# Patient Record
Sex: Female | Born: 1985
Health system: Southern US, Community
[De-identification: ages and names within clinical notes are randomized; demographics above are authoritative.]

## PROBLEM LIST (undated history)

## (undated) DIAGNOSIS — M545 Low back pain, unspecified: Secondary | ICD-10-CM

## (undated) DIAGNOSIS — E785 Hyperlipidemia, unspecified: Secondary | ICD-10-CM

## (undated) DIAGNOSIS — C801 Malignant (primary) neoplasm, unspecified: Secondary | ICD-10-CM

## (undated) DIAGNOSIS — D582 Other hemoglobinopathies: Secondary | ICD-10-CM

## (undated) DIAGNOSIS — Z1371 Encounter for nonprocreative screening for genetic disease carrier status: Secondary | ICD-10-CM

## (undated) DIAGNOSIS — I509 Heart failure, unspecified: Secondary | ICD-10-CM

## (undated) DIAGNOSIS — D0511 Intraductal carcinoma in situ of right breast: Secondary | ICD-10-CM

## (undated) DIAGNOSIS — G43909 Migraine, unspecified, not intractable, without status migrainosus: Secondary | ICD-10-CM

## (undated) DIAGNOSIS — R7301 Impaired fasting glucose: Secondary | ICD-10-CM

## (undated) DIAGNOSIS — I10 Essential (primary) hypertension: Secondary | ICD-10-CM

## (undated) DIAGNOSIS — L309 Dermatitis, unspecified: Secondary | ICD-10-CM

## (undated) HISTORY — DX: Low back pain, unspecified: M54.50

## (undated) HISTORY — DX: Malignant (primary) neoplasm, unspecified: C80.1

## (undated) HISTORY — DX: Impaired fasting glucose: R73.01

## (undated) HISTORY — DX: Low back pain: M54.5

## (undated) HISTORY — DX: Other hemoglobinopathies: D58.2

## (undated) HISTORY — DX: Migraine, unspecified, not intractable, without status migrainosus: G43.909

## (undated) HISTORY — DX: Morbid (severe) obesity due to excess calories: E66.01

## (undated) HISTORY — DX: Hyperlipidemia, unspecified: E78.5

## (undated) HISTORY — DX: Essential (primary) hypertension: I10

---
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
## 2014-10-15 ENCOUNTER — Other Ambulatory Visit: Payer: Self-pay | Admitting: Family Medicine

## 2014-10-15 MED ORDER — AMLODIPINE BESYLATE 5 MG PO TABS: 5.0000 mg | ORAL_TABLET | Freq: Every day | ORAL | Status: DC

## 2014-10-15 NOTE — Telephone Encounter (Signed)
I just approved this earlier today; please resolve with pharmacy; thanks

## 2014-10-15 NOTE — Telephone Encounter (Signed)
Was a duplicate request.

## 2015-02-15 DIAGNOSIS — E785 Hyperlipidemia, unspecified: Secondary | ICD-10-CM | POA: Insufficient documentation

## 2015-02-15 DIAGNOSIS — Z6841 Body Mass Index (BMI) 40.0 and over, adult: Secondary | ICD-10-CM | POA: Insufficient documentation

## 2015-02-15 DIAGNOSIS — G43909 Migraine, unspecified, not intractable, without status migrainosus: Secondary | ICD-10-CM | POA: Insufficient documentation

## 2015-02-15 DIAGNOSIS — I10 Essential (primary) hypertension: Secondary | ICD-10-CM | POA: Insufficient documentation

## 2015-02-15 DIAGNOSIS — M545 Low back pain: Secondary | ICD-10-CM | POA: Insufficient documentation

## 2015-02-15 DIAGNOSIS — R7301 Impaired fasting glucose: Secondary | ICD-10-CM | POA: Insufficient documentation

## 2015-02-21 ENCOUNTER — Encounter: Payer: Self-pay | Admitting: Family Medicine

## 2015-02-21 ENCOUNTER — Ambulatory Visit (INDEPENDENT_AMBULATORY_CARE_PROVIDER_SITE_OTHER): Payer: 59 | Admitting: Family Medicine

## 2015-02-21 VITALS — BP 140/95 | HR 71 | Temp 97.5°F | Ht 64.5 in | Wt 305.0 lb

## 2015-02-21 DIAGNOSIS — I10 Essential (primary) hypertension: Secondary | ICD-10-CM | POA: Diagnosis not present

## 2015-02-21 DIAGNOSIS — R319 Hematuria, unspecified: Secondary | ICD-10-CM | POA: Diagnosis not present

## 2015-02-21 DIAGNOSIS — Z5181 Encounter for therapeutic drug level monitoring: Secondary | ICD-10-CM

## 2015-02-21 DIAGNOSIS — E559 Vitamin D deficiency, unspecified: Secondary | ICD-10-CM | POA: Diagnosis not present

## 2015-02-21 DIAGNOSIS — Z3009 Encounter for other general counseling and advice on contraception: Secondary | ICD-10-CM | POA: Insufficient documentation

## 2015-02-21 DIAGNOSIS — N926 Irregular menstruation, unspecified: Secondary | ICD-10-CM

## 2015-02-21 DIAGNOSIS — R7301 Impaired fasting glucose: Secondary | ICD-10-CM | POA: Diagnosis not present

## 2015-02-21 DIAGNOSIS — E785 Hyperlipidemia, unspecified: Secondary | ICD-10-CM

## 2015-02-21 LAB — MICROSCOPIC EXAMINATION: Renal Epithel, UA: NONE SEEN /hpf

## 2015-02-21 LAB — UA/M W/RFLX CULTURE, ROUTINE
Bilirubin, UA: NEGATIVE
Glucose, UA: NEGATIVE
Ketones, UA: NEGATIVE
LEUKOCYTES UA: NEGATIVE
Nitrite, UA: NEGATIVE
PH UA: 7 (ref 5.0–7.5)
PROTEIN UA: NEGATIVE
Specific Gravity, UA: 1.015 (ref 1.005–1.030)
Urobilinogen, Ur: 0.2 mg/dL (ref 0.2–1.0)

## 2015-02-21 MED ORDER — METOPROLOL SUCCINATE ER 50 MG PO TB24: 50.0000 mg | ORAL_TABLET | Freq: Every day | ORAL | Status: DC

## 2015-02-21 MED ORDER — AMLODIPINE BESYLATE 5 MG PO TABS: 5.0000 mg | ORAL_TABLET | Freq: Every day | ORAL | Status: DC

## 2015-02-21 NOTE — Progress Notes (Signed)
BP 140/95 mmHg  Pulse 71  Temp(Src) 97.5 F (36.4 C)  Ht 5' 4.5" (1.638 m)  Wt 305 lb (138.347 kg)  BMI 51.56 kg/m2  SpO2 99%  LMP 12/20/2014 (Exact Date)   Subjective:    Patient ID: Mckenzie Lopez, female    DOB: Dec 11, 1985, 29 y.o.   MRN: [id number]  HPI: Mckenzie Lopez is a 29 y.o. female  Chief Complaint  Patient presents with  . Hypertension  . Hyperlipidemia  . IFG  Patient is here for f/u of several issues Prediabetes; hard to exercise with current work schedule; not many sugary drinks; does drink some fruit juice, little bottles; not a big bread eater High cholesterol; ate bacon earlier today; not typical for her; just once a week; likes eggs She did not have a regular period last month; no chance she is pregnant; periods have been a little off since off of OCPs; bled for 23 days, then got back on track; had several  No constipation; does not have normal BM every day; very mild weight gain; aunt went through menopause around age 65 she thinks; she denies hair loss; skin dry but not new She did not come fasting today, and asked to return for labs on Monday She has morbid obesity; her biggest problem with working on weight loss is her work schedule; 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 Interim medical history since our last visit reviewed. Allergies and medications reviewed and updated.  Review of Systems Per HPI unless specifically indicated above     Objective:    BP 140/95 mmHg  Pulse 71  Temp(Src) 97.5 F (36.4 C)  Ht 5' 4.5" (1.638 m)  Wt 305 lb (138.347 kg)  BMI 51.56 kg/m2  SpO2 99%  LMP 12/20/2014 (Exact Date)  Wt Readings from Last 3 Encounters:  02/21/15 305 lb  (138.347 kg)  09/04/14 301 lb (136.533 kg)    Physical Exam  Constitutional: She appears well-developed and well-nourished. No distress.  Morbidly obese  HENT:  Head: Normocephalic and atraumatic.  Eyes: EOM are normal. No scleral icterus.  Neck: No thyromegaly present.  Cardiovascular: Normal rate, regular rhythm and normal heart sounds.   No murmur heard. Pulmonary/Chest: Effort normal and breath sounds normal. No respiratory distress. She has no wheezes.  Abdominal: Soft. Bowel sounds are normal. She exhibits no distension.  Musculoskeletal: Normal range of motion. She exhibits no edema.  Neurological: She is alert. She exhibits normal muscle tone.  Skin: Skin is warm and dry. She is not diaphoretic. No pallor.  Psychiatric: She has a normal mood and affect. Her behavior is normal. Judgment and thought content normal.    No results found for this or any previous visit.    Assessment & Plan:   Problem List Items Addressed This Visit      Cardiovascular and Mediastinum   Hypertension    Increase beta-blocker from 37.5 mg daily to 50 mg daily; monitor BP and pulse and contact me in 2 weeks; work on weight loss, DASH guidelines      Relevant Medications   metoprolol succinate (TOPROL-XL) 50 MG 24 hr tablet   amLODipine (NORVASC) 5 MG tablet  Endocrine   IFG (impaired fasting glucose) - Primary    Check A1C and glucose fasting on Monday; avoid sweets, get exercise, lose weight      Relevant Orders   Hgb A1c w/o eAG     Other   Morbid obesity (HCC)    Encouraged weight loss; see AVS      Hyperlipidemia    Check fasting cholesterol; limit saturated fats and eggs, increase fiber      Relevant Medications   metoprolol succinate (TOPROL-XL) 50 MG 24 hr tablet   amLODipine (NORVASC) 5 MG tablet   Other Relevant Orders   Lipid Panel w/o Chol/HDL Ratio   Hematuria    Explained abnormal urine from earlier this year; will recheck today      Relevant Orders   UA/M  w/rflx Culture, Routine   CBC with Differential/Platelet   Irregular periods    Check CBC and TSH; if periods wane, become less frequent, check LH and FSH      Relevant Orders   CBC with Differential/Platelet   TSH   Vitamin D deficiency   Relevant Orders   Vit D  25 hydroxy (rtn osteoporosis monitoring)   Medication monitoring encounter   Relevant Orders   Comprehensive metabolic panel       Follow up plan: Return in about 3 months (around 05/24/2015) for with Dr. Sanda Klein, Monday for fasting labs.  Orders Placed This Encounter  Procedures  . UA/M w/rflx Culture, Routine  . CBC with Differential/Platelet  . Comprehensive metabolic panel  . Lipid Panel w/o Chol/HDL Ratio  . TSH  . Vit D  25 hydroxy (rtn osteoporosis monitoring)  . Hgb A1c w/o eAG   An after-visit summary was printed and given to the patient at [place].  Please see the patient instructions which may contain other information and recommendations beyond what is mentioned above in the assessment and plan.

## 2015-02-21 NOTE — Assessment & Plan Note (Signed)
Encouraged weight loss; see AVS 

## 2015-02-21 NOTE — Assessment & Plan Note (Signed)
Explained abnormal urine from earlier this year; will recheck today

## 2015-02-21 NOTE — Assessment & Plan Note (Signed)
Check A1C and glucose fasting on Monday; avoid sweets, get exercise, lose weight

## 2015-02-21 NOTE — Patient Instructions (Addendum)
Do limit saturated fats like bacon and sausage and cheese and hamburgers, etc. Try to get more fiber Return on Monday for fasting labs Try to increase your activity, work up gradually to 150 minutes per week Increase your water intake to 64 ounces a day Your goal blood pressure is less than 140 mmHg on top and less than 90 mmHg Try to follow the DASH guidelines (DASH stands for Dietary Approaches to Stop Hypertension) Try to limit the sodium in your diet.  Ideally, consume less than 1.5 grams (less than 1,500mg ) per day. Do not add salt when cooking or at the table.  Check the sodium amount on labels when shopping, and choose items lower in sodium when given a choice. Avoid or limit foods that already contain a lot of sodium. Eat a diet rich in fruits and vegetables and whole grains. Return in 3 months Do check your blood pressure and pulse a few times a week and contact me with readings in 2 weeks Check out the information at familydoctor.org entitled "What It Takes to Lose Weight" Try to lose between 1-2 pounds per week by taking in fewer calories and burning off more calories You can succeed by limiting portions, limiting foods dense in calories and fat, becoming more active, and drinking 8 glasses of water a day (64 ounces) Don't skip meals, especially breakfast, as skipping meals may alter your metabolism Do not use over-the-counter weight loss pills or gimmicks that claim rapid weight loss A healthy BMI (or body mass index) is between 18.5 and 24.9 You can calculate your ideal BMI at the [place] website ClubMon[employer]fr

## 2015-02-21 NOTE — Assessment & Plan Note (Signed)
Check CBC and TSH; if periods wane, become less frequent, check LH and [facility]

## 2015-02-21 NOTE — Assessment & Plan Note (Signed)
Check fasting cholesterol; limit saturated fats and eggs, increase fiber

## 2015-02-21 NOTE — Assessment & Plan Note (Signed)
Increase beta-blocker from 37.5 mg daily to 50 mg daily; monitor BP and pulse and contact me in 2 weeks; work on weight loss, DASH guidelines

## 2015-02-25 ENCOUNTER — Other Ambulatory Visit: Payer: 59

## 2015-02-25 DIAGNOSIS — R7301 Impaired fasting glucose: Secondary | ICD-10-CM

## 2015-02-25 DIAGNOSIS — N926 Irregular menstruation, unspecified: Secondary | ICD-10-CM

## 2015-02-25 DIAGNOSIS — R319 Hematuria, unspecified: Secondary | ICD-10-CM

## 2015-02-25 DIAGNOSIS — Z5181 Encounter for therapeutic drug level monitoring: Secondary | ICD-10-CM

## 2015-02-25 DIAGNOSIS — E785 Hyperlipidemia, unspecified: Secondary | ICD-10-CM

## 2015-02-25 DIAGNOSIS — E559 Vitamin D deficiency, unspecified: Secondary | ICD-10-CM

## 2015-02-26 LAB — CBC WITH DIFFERENTIAL/PLATELET
BASOS ABS: 0 10*3/uL (ref 0.0–0.2)
BASOS: 1 %
EOS (ABSOLUTE): 0.1 10*3/uL (ref 0.0–0.4)
EOS: 3 %
HEMATOCRIT: 36.5 % (ref 34.0–46.6)
HEMOGLOBIN: 12.8 g/dL (ref 11.1–15.9)
IMMATURE GRANS (ABS): 0 10*3/uL (ref 0.0–0.1)
IMMATURE GRANULOCYTES: 0 %
Lymphocytes Absolute: 1.6 10*3/uL (ref 0.7–3.1)
Lymphs: 32 %
MCH: 27.5 pg (ref 26.6–33.0)
MCHC: 35.1 g/dL (ref 31.5–35.7)
MCV: 79 fL (ref 79–97)
Monocytes Absolute: 0.3 10*3/uL (ref 0.1–0.9)
Monocytes: 6 %
Neutrophils Absolute: 3 10*3/uL (ref 1.4–7.0)
Neutrophils: 58 %
Platelets: 428 10*3/uL — ABNORMAL HIGH (ref 150–379)
RBC: 4.65 x10E6/uL (ref 3.77–5.28)
RDW: 15.9 % — ABNORMAL HIGH (ref 12.3–15.4)
WBC: 5.1 10*3/uL (ref 3.4–10.8)

## 2015-02-26 LAB — LIPID PANEL W/O CHOL/HDL RATIO
Cholesterol, Total: 192 mg/dL (ref 100–199)
HDL: 46 mg/dL (ref >39)
LDL CALC: 129 mg/dL — AB (ref 0–99)
TRIGLYCERIDES: 85 mg/dL (ref 0–149)
VLDL CHOLESTEROL CAL: 17 mg/dL (ref 5–40)

## 2015-02-26 LAB — TSH: TSH: 2.17 u[IU]/mL (ref 0.450–4.500)

## 2015-02-26 LAB — COMPREHENSIVE METABOLIC PANEL
ALBUMIN: 4.1 g/dL (ref 3.5–5.5)
ALT: 13 IU/L (ref 0–32)
AST: 14 IU/L (ref 0–40)
Albumin/Globulin Ratio: 1.4 (ref 1.1–2.5)
Alkaline Phosphatase: 52 IU/L (ref 39–117)
BUN / CREAT RATIO: 9 (ref 8–20)
BUN: 9 mg/dL (ref 6–20)
Bilirubin Total: 0.4 mg/dL (ref 0.0–1.2)
CALCIUM: 9.1 mg/dL (ref 8.7–10.2)
CO2: 23 mmol/L (ref 18–29)
CREATININE: 0.95 mg/dL (ref 0.57–1.00)
Chloride: 103 mmol/L (ref 97–106)
GFR calc Af Amer: 92 mL/min/{1.73_m2} (ref >59)
GFR, EST NON AFRICAN AMERICAN: 80 mL/min/{1.73_m2} (ref >59)
GLOBULIN, TOTAL: 2.9 g/dL (ref 1.5–4.5)
GLUCOSE: 96 mg/dL (ref 65–99)
Potassium: 3.9 mmol/L (ref 3.5–5.2)
SODIUM: 140 mmol/L (ref 136–144)
Total Protein: 7 g/dL (ref 6.0–8.5)

## 2015-02-26 LAB — VITAMIN D 25 HYDROXY (VIT D DEFICIENCY, FRACTURES): Vit D, 25-Hydroxy: 34.3 ng/mL (ref 30.0–100.0)

## 2015-02-26 LAB — HGB A1C W/O EAG: Hgb A1c MFr Bld: 5.5 % (ref 4.8–5.6)

## 2015-03-04 ENCOUNTER — Encounter: Payer: Self-pay | Admitting: Family Medicine

## 2015-04-10 ENCOUNTER — Telehealth: Payer: Self-pay

## 2015-04-10 NOTE — Telephone Encounter (Signed)
She just found out that she is pregnant, wants to know if her BP med is safe for her to take.

## 2015-04-11 MED ORDER — PRENATA 29-1 MG PO CHEW: 1.0000 | CHEWABLE_TABLET | Freq: Every day | ORAL | Status: DC

## 2015-04-11 NOTE — Telephone Encounter (Signed)
I talked with patient, both BP meds are category C; she has appt to see OB on Tuesday; she is [redacted] weeks along; I asked her first to call OB to see what she wants to do (change meds there, have me change meds, or just wait until Tuesday) Patient agrees and she'll call OB right now

## 2015-05-08 DIAGNOSIS — D582 Other hemoglobinopathies: Secondary | ICD-10-CM | POA: Insufficient documentation

## 2015-05-28 ENCOUNTER — Ambulatory Visit (INDEPENDENT_AMBULATORY_CARE_PROVIDER_SITE_OTHER): Payer: 59 | Admitting: Family Medicine

## 2015-05-28 ENCOUNTER — Encounter: Payer: Self-pay | Admitting: Family Medicine

## 2015-05-28 VITALS — BP 153/91 | HR 76 | Temp 97.3°F | Ht 64.0 in | Wt 294.0 lb

## 2015-05-28 DIAGNOSIS — E785 Hyperlipidemia, unspecified: Secondary | ICD-10-CM | POA: Diagnosis not present

## 2015-05-28 DIAGNOSIS — R7301 Impaired fasting glucose: Secondary | ICD-10-CM | POA: Diagnosis not present

## 2015-05-28 DIAGNOSIS — I10 Essential (primary) hypertension: Secondary | ICD-10-CM | POA: Diagnosis not present

## 2015-05-28 NOTE — Assessment & Plan Note (Signed)
Encouragement given for healthier eating

## 2015-05-28 NOTE — Assessment & Plan Note (Addendum)
Encouraged DASH guidelines; see AVS; I personally called OB with her BP readings, left detailed message, asked if they will contact patient directly about adjusting her BP medicine; praised patient for weight management attempts, eating better; we personally reviewed the [place] page and I showed sodium content for various foods

## 2015-05-28 NOTE — Progress Notes (Signed)
BP 153/91 mmHg  Pulse 76  Temp(Src) 97.3 F (36.3 C)  Ht 5\' 4"  (1.626 m)  Wt 294 lb (133.358 kg)  BMI 50.44 kg/m2  SpO2 97%  LMP 12/20/2014 (Exact Date)   Subjective:    Patient ID: Mckenzie Lopez, female    DOB: February 22, 1986, 30 y.o.   MRN: [id number]  HPI: Mckenzie Lopez is a 30 y.o. female  Chief Complaint  Patient presents with  . Hypertension    Patient had to stop the Toprol due to be pregnant  . Hyperlipidemia  . IFG  . Morbid Obesity  . Eye Problem    blood shot right eye since Friday.   She is 3 months pregnant; [facility] July 30th; her OB switched her beta-blocker; she is not on the toprol, on the labetalol now; 134/83 at the [facility] office; she goes back March 1st She had some french fries with salt on them in the last few days, [employer] fries,   High cholesterol; she eats processed pork products, bacon; does eat eggs, maybe 4 a week; likes cheese  She has a blood vessel that broke on her right eye; her allergies were really bothering her; was also throwing up and she thinks maybe she did it with big sneeze or dry heaving; no eye pain; no vision problems; no double vision; no nosebleeding, no gum bleeding, no blood in urine or stool  Prediabetes; they are watching her sugars at [facility]; never had gestational diabetes; mother is borderline diabetes  She has been losing weight, trying to eat better; lost 11 pounds since last visit  Exercising and getting sleep  Last pap smear April 06, 2015 or thereabouts; normal per patient  Relevant past medical, surgical, family and social history reviewed and updated as indicated. Interim medical history since our last visit reviewed. Allergies and medications reviewed and updated.  Review of Systems Per HPI unless specifically indicated above     Objective:    BP 153/91 mmHg  Pulse 76  Temp(Src) 97.3 F (36.3 C)  Ht 5\' 4"  (1.626 m)  Wt 294 lb (133.358 kg)  BMI 50.44 kg/m2  SpO2 97%  LMP 12/20/2014 (Exact  Date)  Wt Readings from Last 3 Encounters:  05/28/15 294 lb (133.358 kg)  02/21/15 305 lb (138.347 kg)  09/04/14 301 lb (136.533 kg)    Today's Vitals   05/28/15 0821 05/28/15 0850  BP: 153/97 153/91  Pulse: 87 76  Temp: 97.3 F (36.3 C)   Height: 5\' 4"  (1.626 m)   Weight: 294 lb (133.358 kg)   SpO2: 97%     Physical Exam  Constitutional: She appears well-developed and well-nourished. No distress.  HENT:  Head: Normocephalic and atraumatic.  Eyes: EOM are normal. No scleral icterus.  Conjunctival hemorrhage, small lateral right eye  Neck: No thyromegaly present.  Cardiovascular: Normal rate, regular rhythm and normal heart sounds.   No murmur heard. Pulmonary/Chest: Effort normal and breath sounds normal. No respiratory distress. She has no wheezes.  Abdominal: Soft. She exhibits no distension.  Musculoskeletal: Normal range of motion. She exhibits no edema.  Neurological: She is alert. She exhibits normal muscle tone.  Skin: Skin is warm and dry. She is not diaphoretic. No pallor.  Psychiatric: She has a normal mood and affect. Her behavior is normal. Judgment and thought content normal.   Results for orders placed or performed in visit on 02/25/15  CBC with Differential/Platelet  Result Value Ref Range   WBC 5.1 3.4 - 10.8 x10E3/uL  RBC 4.65 3.77 - 5.28 x10E6/uL   Hemoglobin 12.8 11.1 - 15.9 g/dL   Hematocrit 36.5 34.0 - 46.6 %   MCV 79 79 - 97 fL   MCH 27.5 26.6 - 33.0 pg   MCHC 35.1 31.5 - 35.7 g/dL   RDW 15.9 (H) 12.3 - 15.4 %   Platelets 428 (H) 150 - 379 x10E3/uL   Neutrophils 58 %   Lymphs 32 %   Monocytes 6 %   Eos 3 %   Basos 1 %   Neutrophils Absolute 3.0 1.4 - 7.0 x10E3/uL   Lymphocytes Absolute 1.6 0.7 - 3.1 x10E3/uL   Monocytes Absolute 0.3 0.1 - 0.9 x10E3/uL   EOS (ABSOLUTE) 0.1 0.0 - 0.4 x10E3/uL   Basophils Absolute 0.0 0.0 - 0.2 x10E3/uL   Immature Granulocytes 0 %   Immature Grans (Abs) 0.0 0.0 - 0.1 x10E3/uL  Comprehensive metabolic panel   Result Value Ref Range   Glucose 96 65 - 99 mg/dL   BUN 9 6 - 20 mg/dL   Creatinine, Ser 0.95 0.57 - 1.00 mg/dL   GFR calc non Af Amer 80 >59 mL/min/1.73   GFR calc Af Amer 92 >59 mL/min/1.73   BUN/Creatinine Ratio 9 8 - 20   Sodium 140 136 - 144 mmol/L   Potassium 3.9 3.5 - 5.2 mmol/L   Chloride 103 97 - 106 mmol/L   CO2 23 18 - 29 mmol/L   Calcium 9.1 8.7 - 10.2 mg/dL   Total Protein 7.0 6.0 - 8.5 g/dL   Albumin 4.1 3.5 - 5.5 g/dL   Globulin, Total 2.9 1.5 - 4.5 g/dL   Albumin/Globulin Ratio 1.4 1.1 - 2.5   Bilirubin Total 0.4 0.0 - 1.2 mg/dL   Alkaline Phosphatase 52 39 - 117 IU/L   AST 14 0 - 40 IU/L   ALT 13 0 - 32 IU/L  Lipid Panel w/o Chol/HDL Ratio  Result Value Ref Range   Cholesterol, Total 192 100 - 199 mg/dL   Triglycerides 85 0 - 149 mg/dL   HDL 46 >39 mg/dL   VLDL Cholesterol Cal 17 5 - 40 mg/dL   LDL Calculated 129 (H) 0 - 99 mg/dL  TSH  Result Value Ref Range   TSH 2.170 0.450 - 4.500 uIU/mL  Vit D  25 hydroxy (rtn osteoporosis monitoring)  Result Value Ref Range   Vit D, 25-Hydroxy 34.3 30.0 - 100.0 ng/mL  Hgb A1c w/o eAG  Result Value Ref Range   Hgb A1c MFr Bld 5.5 4.8 - 5.6 %      Assessment & Plan:   Problem List Items Addressed This Visit      Cardiovascular and Mediastinum   Hypertension - Primary    Encouraged DASH guidelines; see AVS; I personally called OB with her BP readings, left detailed message, asked if they will contact patient directly about adjusting her BP medicine; praised patient for weight management attempts, eating better; we personally reviewed the [employer] page and I showed sodium content for various foods      Relevant Medications   labetalol (NORMODYNE) 200 MG tablet     Endocrine   IFG (impaired fasting glucose)    Patient will be having her glucose monitored by her OB; encouragement given for weight loss        Other   Morbid obesity ([place])    Encouragement given for healthier eating      Hyperlipidemia     Really focus on dietary changes; see AVS; no medication at  this time since she is pregnant      Relevant Medications   labetalol (NORMODYNE) 200 MG tablet       Follow up plan: No Follow-up on file.  Return AFTER delivery; OB will be running the show while pregnant  An after-visit summary was printed and given to the patient at [place].  Please see the patient instructions which may contain other information and recommendations beyond what is mentioned above in the assessment and plan.

## 2015-05-28 NOTE — Assessment & Plan Note (Signed)
Patient will be having her glucose monitored by her OB; encouragement given for weight loss

## 2015-05-28 NOTE — Patient Instructions (Addendum)
Try to limit egg yolks to no more than 3 per week   Your goal blood pressure is less than 130 mmHg on top OR whatever your OB doctor says is right for you Try to follow the DASH guidelines (DASH stands for Dietary Approaches to Stop Hypertension) Try to limit the sodium in your diet.  Ideally, consume less than 1.5 grams (less than 1,500mg ) per day. Do not add salt when cooking or at the table.  Check the sodium amount on labels when shopping, and choose items lower in sodium when given a choice. Avoid or limit foods that already contain a lot of sodium. Eat a diet rich in fruits and vegetables and whole grains.  Please call your OB today and ask them if they are interested in increasing your labetalol since your pressures were high today   DASH Eating Plan DASH stands for "Dietary Approaches to Stop Hypertension." The DASH eating plan is a healthy eating plan that has been shown to reduce high blood pressure (hypertension). Additional health benefits may include reducing the risk of type 2 diabetes mellitus, heart disease, and stroke. The DASH eating plan may also help with weight loss. WHAT DO I NEED TO KNOW ABOUT THE DASH EATING PLAN? For the DASH eating plan, you will follow these general guidelines:  Choose foods with a percent daily value for sodium of less than 5% (as listed on the food label).  Use salt-free seasonings or herbs instead of table salt or sea salt.  Check with your health care provider or pharmacist before using salt substitutes.  Eat lower-sodium products, often labeled as "lower sodium" or "no salt added."  Eat fresh foods.  Eat more vegetables, fruits, and low-fat dairy products.  Choose whole grains. Look for the word "whole" as the first word in the ingredient list.  Choose fish and skinless chicken or [place] more often than red meat. Limit fish, poultry, and meat to 6 oz (170 g) each day.  Limit sweets, desserts, sugars, and sugary drinks.  Choose  heart-healthy fats.  Limit cheese to 1 oz (28 g) per day.  Eat more home-cooked food and less restaurant, buffet, and fast food.  Limit fried foods.  Cook foods using methods other than frying.  Limit canned vegetables. If you do use them, rinse them well to decrease the sodium.  When eating at a restaurant, ask that your food be prepared with less salt, or no salt if possible. WHAT FOODS CAN I EAT? Seek help from a dietitian for individual calorie needs. Grains Whole grain or whole wheat bread. Brown rice. Whole grain or whole wheat pasta. Quinoa, bulgur, and whole grain cereals. Low-sodium cereals. Corn or whole wheat flour tortillas. Whole grain cornbread. Whole grain crackers. Low-sodium crackers. Vegetables Fresh or frozen vegetables (raw, steamed, roasted, or grilled). Low-sodium or reduced-sodium tomato and vegetable juices. Low-sodium or reduced-sodium tomato sauce and paste. Low-sodium or reduced-sodium canned vegetables.  Fruits All fresh, canned (in natural juice), or frozen fruits. Meat and Other Protein Products Ground beef (85% or leaner), grass-fed beef, or beef trimmed of fat. Skinless chicken or [place]. Ground chicken or [place]. Pork trimmed of fat. All fish and seafood. Eggs. Dried beans, peas, or lentils. Unsalted nuts and seeds. Unsalted canned beans. Dairy Low-fat dairy products, such as skim or 1% milk, 2% or reduced-fat cheeses, low-fat ricotta or cottage cheese, or plain low-fat yogurt. Low-sodium or reduced-sodium cheeses. Fats and Oils Tub margarines without trans fats. Light or reduced-fat mayonnaise and salad dressings (  reduced sodium). Avocado. Safflower, olive, or canola oils. Natural peanut or almond butter. Other Unsalted popcorn and pretzels. The items listed above may not be a complete list of recommended foods or beverages. Contact your dietitian for more options. WHAT FOODS ARE NOT RECOMMENDED? Grains White bread. White pasta. White rice. Refined  cornbread. Bagels and croissants. Crackers that contain trans fat. Vegetables Creamed or fried vegetables. Vegetables in a cheese sauce. Regular canned vegetables. Regular canned tomato sauce and paste. Regular tomato and vegetable juices. Fruits Dried fruits. Canned fruit in light or heavy syrup. Fruit juice. Meat and Other Protein Products Fatty cuts of meat. Ribs, chicken wings, bacon, sausage, bologna, salami, chitterlings, fatback, hot dogs, bratwurst, and packaged luncheon meats. Salted nuts and seeds. Canned beans with salt. Dairy Whole or 2% milk, cream, half-and-half, and cream cheese. Whole-fat or sweetened yogurt. Full-fat cheeses or blue cheese. Nondairy creamers and whipped toppings. Processed cheese, cheese spreads, or cheese curds. Condiments Onion and garlic salt, seasoned salt, table salt, and sea salt. Canned and packaged gravies. Worcestershire sauce. Tartar sauce. Barbecue sauce. Teriyaki sauce. Soy sauce, including reduced sodium. Steak sauce. Fish sauce. Oyster sauce. Cocktail sauce. Horseradish. Ketchup and mustard. Meat flavorings and tenderizers. Bouillon cubes. Hot sauce. Tabasco sauce. Marinades. Taco seasonings. Relishes. Fats and Oils Butter, stick margarine, lard, shortening, ghee, and bacon fat. Coconut, palm kernel, or palm oils. Regular salad dressings. Other Pickles and olives. Salted popcorn and pretzels. The items listed above may not be a complete list of foods and beverages to avoid. Contact your dietitian for more information. WHERE CAN I FIND MORE INFORMATION? National Heart, Lung, and Blood Institute: travelstabloid.com   This information is not intended to replace advice given to you by your health care provider. Make sure you discuss any questions you have with your health care provider.   Document Released: 04/16/2011 Document Revised: 05/18/2014 Document Reviewed: 03/01/2013 Elsevier Interactive Patient Education  2016 Elsevier Inc. Cholesterol Cholesterol is a fat. Your body needs a small amount of cholesterol. Cholesterol may build up in your blood vessels. This increases your chance of having a heart attack or stroke. You cannot feel your cholesterol levels. The only way to know your cholesterol level is high is with a blood test. Keep your test results. Work with your doctor to keep your cholesterol at a good level. WHAT DO THE TEST RESULTS MEAN?  Total cholesterol is how much cholesterol is in your blood.  LDL is bad cholesterol. This is the type that can build up. You want LDL to be low.  HDL is good cholesterol. It cleans your blood vessels and carries LDL away. You want HDL to be high.  Triglycerides are fat that the body can burn for energy or store. WHAT ARE GOOD LEVELS OF CHOLESTEROL?  Total cholesterol below 200.  LDL below 100 for people at risk. Below 70 for those at very high risk.  HDL above 50 is good. Above 60 is best.  Triglycerides below 150. HOW CAN I LOWER MY CHOLESTEROL?  Diet. Follow your diet programs as told by your doctor.  Choose fish, white meat chicken, roasted [place], or baked [place]. Try not to eat red meat, fried foods, or processed meats such as sausage and lunch meats.  Eat lots of fresh fruits and vegetables.  Choose whole grains, beans, pasta, potatoes, and cereals.  Use only small amounts of olive, corn, or canola oils.  Try not to eat butter, mayonnaise, shortening, or palm kernel oils.  Try not to eat  foods with trans fats.  Drink skim or nonfat milk. Eat low-fat or nonfat yogurt and cheeses. Try not to drink whole milk or cream. Try not to eat ice cream, egg yolks, and full-fat cheeses.  Healthy desserts include angel food cake, ginger snaps, animal crackers, hard candy, popsicles, and low-fat or nonfat frozen yogurt. Try not to eat pastries, cakes, pies, and cookies.  Exercise. Follow your exercise programs as told by your doctor.  Be more  active. You can try gardening, walking, or taking the stairs. Ask your doctor about how you can be more active.  Medicine. Take medicine as told by your doctor.   This information is not intended to replace advice given to you by your health care provider. Make sure you discuss any questions you have with your health care provider.   Document Released: 07/24/2008 Document Revised: 05/18/2014 Document Reviewed: 02/08/2013 Elsevier Interactive Patient Education [employer].

## 2015-05-28 NOTE — Assessment & Plan Note (Addendum)
Really focus on dietary changes; see AVS; no medication at this time since she is pregnant

## 2015-05-29 ENCOUNTER — Telehealth: Payer: Self-pay

## 2015-05-29 NOTE — Telephone Encounter (Signed)
She left a message Tuesday afternoon stating that you were trying to call her OB doctor but you had called the [facility] office. She has the correct number for the location she goes to. It is [phone].

## 2015-11-13 ENCOUNTER — Encounter: Payer: Self-pay | Admitting: Emergency Medicine

## 2015-11-13 ENCOUNTER — Emergency Department: Payer: 59

## 2015-11-13 ENCOUNTER — Inpatient Hospital Stay
Admission: EM | Admit: 2015-11-13 | Discharge: 2015-11-16 | DRG: 776 | Disposition: A | Discharge status: Home / Self Care | Payer: 59 | Attending: Internal Medicine | Admitting: Internal Medicine

## 2015-11-13 DIAGNOSIS — R0602 Shortness of breath: Secondary | ICD-10-CM

## 2015-11-13 DIAGNOSIS — G4733 Obstructive sleep apnea (adult) (pediatric): Secondary | ICD-10-CM | POA: Diagnosis present

## 2015-11-13 DIAGNOSIS — J810 Acute pulmonary edema: Secondary | ICD-10-CM

## 2015-11-13 DIAGNOSIS — O9953 Diseases of the respiratory system complicating the puerperium: Secondary | ICD-10-CM | POA: Diagnosis present

## 2015-11-13 DIAGNOSIS — E876 Hypokalemia: Secondary | ICD-10-CM | POA: Diagnosis present

## 2015-11-13 DIAGNOSIS — I248 Other forms of acute ischemic heart disease: Secondary | ICD-10-CM | POA: Diagnosis present

## 2015-11-13 DIAGNOSIS — Z888 Allergy status to other drugs, medicaments and biological substances status: Secondary | ICD-10-CM

## 2015-11-13 DIAGNOSIS — I110 Hypertensive heart disease with heart failure: Secondary | ICD-10-CM | POA: Diagnosis present

## 2015-11-13 DIAGNOSIS — J9600 Acute respiratory failure, unspecified whether with hypoxia or hypercapnia: Secondary | ICD-10-CM | POA: Diagnosis present

## 2015-11-13 DIAGNOSIS — I160 Hypertensive urgency: Secondary | ICD-10-CM | POA: Diagnosis present

## 2015-11-13 DIAGNOSIS — O9943 Diseases of the circulatory system complicating the puerperium: Secondary | ICD-10-CM | POA: Diagnosis present

## 2015-11-13 DIAGNOSIS — Z8249 Family history of ischemic heart disease and other diseases of the circulatory system: Secondary | ICD-10-CM

## 2015-11-13 DIAGNOSIS — O99215 Obesity complicating the puerperium: Secondary | ICD-10-CM | POA: Diagnosis present

## 2015-11-13 DIAGNOSIS — I10 Essential (primary) hypertension: Secondary | ICD-10-CM | POA: Diagnosis not present

## 2015-11-13 DIAGNOSIS — E785 Hyperlipidemia, unspecified: Secondary | ICD-10-CM | POA: Diagnosis present

## 2015-11-13 DIAGNOSIS — J811 Chronic pulmonary edema: Secondary | ICD-10-CM | POA: Diagnosis present

## 2015-11-13 DIAGNOSIS — D649 Anemia, unspecified: Secondary | ICD-10-CM | POA: Diagnosis present

## 2015-11-13 DIAGNOSIS — I5031 Acute diastolic (congestive) heart failure: Secondary | ICD-10-CM | POA: Diagnosis present

## 2015-11-13 DIAGNOSIS — Z833 Family history of diabetes mellitus: Secondary | ICD-10-CM | POA: Diagnosis not present

## 2015-11-13 DIAGNOSIS — R7989 Other specified abnormal findings of blood chemistry: Secondary | ICD-10-CM

## 2015-11-13 DIAGNOSIS — Z6841 Body Mass Index (BMI) 40.0 and over, adult: Secondary | ICD-10-CM | POA: Diagnosis not present

## 2015-11-13 DIAGNOSIS — G43909 Migraine, unspecified, not intractable, without status migrainosus: Secondary | ICD-10-CM | POA: Diagnosis present

## 2015-11-13 DIAGNOSIS — I509 Heart failure, unspecified: Secondary | ICD-10-CM

## 2015-11-13 LAB — CBC WITH DIFFERENTIAL/PLATELET
BASOS ABS: 0.1 10*3/uL (ref 0–0.1)
BASOS PCT: 1 %
EOS ABS: 0.3 10*3/uL (ref 0–0.7)
Eosinophils Relative: 3 %
HCT: 33.6 % — ABNORMAL LOW (ref 35.0–47.0)
Hemoglobin: 12 g/dL (ref 12.0–16.0)
Lymphocytes Relative: 12 %
Lymphs Abs: 1.3 10*3/uL (ref 1.0–3.6)
MCH: 28.9 pg (ref 26.0–34.0)
MCHC: 35.8 g/dL (ref 32.0–36.0)
MCV: 80.9 fL (ref 80.0–100.0)
MONO ABS: 0.7 10*3/uL (ref 0.2–0.9)
MONOS PCT: 6 %
NEUTROS PCT: 78 %
Neutro Abs: 8.6 10*3/uL — ABNORMAL HIGH (ref 1.4–6.5)
Platelets: 505 10*3/uL — ABNORMAL HIGH (ref 150–440)
RBC: 4.16 MIL/uL (ref 3.80–5.20)
RDW: 15.3 % — AB (ref 11.5–14.5)
WBC: 11.1 10*3/uL — ABNORMAL HIGH (ref 3.6–11.0)

## 2015-11-13 LAB — URINALYSIS COMPLETE WITH MICROSCOPIC (ARMC ONLY)
BILIRUBIN URINE: NEGATIVE
Bacteria, UA: NONE SEEN
Bilirubin Urine: NEGATIVE
Glucose, UA: NEGATIVE mg/dL
Glucose, UA: NEGATIVE mg/dL
KETONES UR: NEGATIVE mg/dL
KETONES UR: NEGATIVE mg/dL
LEUKOCYTES UA: NEGATIVE
Nitrite: POSITIVE — AB
Nitrite: NEGATIVE
PH: 6 (ref 5.0–8.0)
PROTEIN: NEGATIVE mg/dL
PROTEIN: NEGATIVE mg/dL
SQUAMOUS EPITHELIAL / LPF: NONE SEEN
Specific Gravity, Urine: 1.009 (ref 1.005–1.030)
Specific Gravity, Urine: 1.004 — ABNORMAL LOW (ref 1.005–1.030)
pH: 6 (ref 5.0–8.0)

## 2015-11-13 LAB — COMPREHENSIVE METABOLIC PANEL
ALBUMIN: 3.3 g/dL — AB (ref 3.5–5.0)
ALT: 38 U/L (ref 14–54)
ANION GAP: 7 (ref 5–15)
AST: 31 U/L (ref 15–41)
Alkaline Phosphatase: 76 U/L (ref 38–126)
BUN: 13 mg/dL (ref 6–20)
CHLORIDE: 108 mmol/L (ref 101–111)
CO2: 24 mmol/L (ref 22–32)
Calcium: 8.9 mg/dL (ref 8.9–10.3)
Creatinine, Ser: 0.79 mg/dL (ref 0.44–1.00)
GFR calc Af Amer: >60 mL/min (ref >60)
GFR calc non Af Amer: >60 mL/min (ref >60)
GLUCOSE: 83 mg/dL (ref 65–99)
POTASSIUM: 3.5 mmol/L (ref 3.5–5.1)
SODIUM: 139 mmol/L (ref 135–145)
Total Bilirubin: 0.6 mg/dL (ref 0.3–1.2)
Total Protein: 6.5 g/dL (ref 6.5–8.1)

## 2015-11-13 LAB — MAGNESIUM: MAGNESIUM: 1.5 mg/dL — AB (ref 1.7–2.4)

## 2015-11-13 LAB — GLUCOSE, CAPILLARY: GLUCOSE-CAPILLARY: 84 mg/dL (ref 65–99)

## 2015-11-13 LAB — PROTIME-INR
INR: 1.21
Prothrombin Time: 15.5 seconds — ABNORMAL HIGH (ref 11.4–15.0)

## 2015-11-13 LAB — PROTEIN / CREATININE RATIO, URINE
CREATININE, URINE: 51 mg/dL
Creatinine, Urine: <10 mg/dL
PROTEIN CREATININE RATIO: 0.33 mg/mg{creat} — AB (ref 0.00–0.15)
TOTAL PROTEIN, URINE: 17 mg/dL
Total Protein, Urine: <6 mg/dL

## 2015-11-13 LAB — APTT: APTT: 32 s (ref 24–36)

## 2015-11-13 LAB — TROPONIN I: TROPONIN I: 0.06 ng/mL — AB (ref <0.03)

## 2015-11-13 LAB — BRAIN NATRIURETIC PEPTIDE: B NATRIURETIC PEPTIDE 5: 594 pg/mL — AB (ref 0.0–100.0)

## 2015-11-13 MED ORDER — ONDANSETRON HCL 4 MG/2ML IJ SOLN
4.0000 mg | Freq: Four times a day (QID) | INTRAMUSCULAR | Status: DC | PRN
  Administered 2015-11-14: 4 mg via INTRAVENOUS
  Filled 2015-11-13: qty 2

## 2015-11-13 MED ORDER — ACETAMINOPHEN 650 MG RE SUPP: 650.0000 mg | Freq: Four times a day (QID) | RECTAL | Status: DC | PRN

## 2015-11-13 MED ORDER — SODIUM CHLORIDE 0.9% FLUSH
3.0000 mL | Freq: Two times a day (BID) | INTRAVENOUS | Status: DC
Start: 2015-11-14 — End: 2015-11-16
  Administered 2015-11-14 – 2015-11-16 (×6): 3 mL via INTRAVENOUS

## 2015-11-13 MED ORDER — HYDRALAZINE HCL 20 MG/ML IJ SOLN
10.0000 mg | Freq: Once | INTRAMUSCULAR | Status: AC
  Administered 2015-11-13: 10 mg via INTRAVENOUS
  Filled 2015-11-13: qty 1

## 2015-11-13 MED ORDER — NITROGLYCERIN IN D5W 200-5 MCG/ML-% IV SOLN
0.0000 ug/min | Freq: Once | INTRAVENOUS | Status: AC
  Administered 2015-11-13: 5 ug/min via INTRAVENOUS
  Filled 2015-11-13: qty 250

## 2015-11-13 MED ORDER — FUROSEMIDE 10 MG/ML IJ SOLN
40.0000 mg | Freq: Two times a day (BID) | INTRAMUSCULAR | Status: AC
Start: 2015-11-14 — End: 2015-11-14
  Administered 2015-11-14 (×2): 40 mg via INTRAVENOUS
  Filled 2015-11-13 (×2): qty 4

## 2015-11-13 MED ORDER — ACETAMINOPHEN 325 MG PO TABS
650.0000 mg | ORAL_TABLET | Freq: Four times a day (QID) | ORAL | Status: DC | PRN
  Administered 2015-11-14: 650 mg via ORAL
  Filled 2015-11-13: qty 2

## 2015-11-13 MED ORDER — LABETALOL HCL 200 MG PO TABS
200.0000 mg | ORAL_TABLET | Freq: Two times a day (BID) | ORAL | Status: DC
  Administered 2015-11-14 (×2): 200 mg via ORAL
  Filled 2015-11-13 (×2): qty 1

## 2015-11-13 MED ORDER — MAGNESIUM SULFATE 50 % IJ SOLN
2.0000 g/h | INTRAVENOUS | Status: DC
  Filled 2015-11-13: qty 80

## 2015-11-13 MED ORDER — MAGNESIUM SULFATE BOLUS VIA INFUSION
4.0000 g | Freq: Once | INTRAVENOUS | Status: DC
  Filled 2015-11-13: qty 500

## 2015-11-13 MED ORDER — FUROSEMIDE 10 MG/ML IJ SOLN
60.0000 mg | Freq: Once | INTRAMUSCULAR | Status: AC
Start: 2015-11-13 — End: 2015-11-13
  Administered 2015-11-13: 60 mg via INTRAVENOUS
  Filled 2015-11-13: qty 8

## 2015-11-13 MED ORDER — HYDRALAZINE HCL 20 MG/ML IJ SOLN: 20.0000 mg | Freq: Once | INTRAMUSCULAR | Status: DC

## 2015-11-13 MED ORDER — ENOXAPARIN SODIUM 40 MG/0.4ML ~~LOC~~ SOLN
40.0000 mg | Freq: Two times a day (BID) | SUBCUTANEOUS | Status: DC
  Administered 2015-11-14 – 2015-11-16 (×6): 40 mg via SUBCUTANEOUS
  Filled 2015-11-13 (×6): qty 0.4

## 2015-11-13 MED ORDER — ONDANSETRON HCL 4 MG PO TABS: 4.0000 mg | ORAL_TABLET | Freq: Four times a day (QID) | ORAL | Status: DC | PRN

## 2015-11-13 MED ORDER — NITROGLYCERIN IN D5W 200-5 MCG/ML-% IV SOLN
0.0000 ug/min | INTRAVENOUS | Status: DC
  Administered 2015-11-14: 25 ug/min via INTRAVENOUS

## 2015-11-13 MED ORDER — SENNOSIDES-DOCUSATE SODIUM 8.6-50 MG PO TABS: 1.0000 | ORAL_TABLET | Freq: Every evening | ORAL | Status: DC | PRN

## 2015-11-13 NOTE — ED Notes (Signed)
Pt in with co shob since today, denies any recent illness. Did had normal vaginal delivery on Sunday, shob started this am. Denies any hx of the same or any hx of lung disease.

## 2015-11-13 NOTE — Progress Notes (Signed)
Anticoagulation monitoring(Lovenox):  30 yo  ordered Lovenox 40 mg Q24h  Filed Weights   11/13/15 2001 11/13/15 2342  Weight: 305 lb (138.347 kg) 296 lb 1.2 oz (134.3 kg)   BMI 50.9  Lab Results  Component Value Date   CREATININE 0.79 11/13/2015   CREATININE 0.95 02/25/2015   Estimated Creatinine Clearance: 140.1 mL/min (by C-G formula based on Cr of 0.79). Hemoglobin & Hematocrit     Component Value Date/Time   HGB 12.0 11/13/2015 2021   HCT 33.6* 11/13/2015 2021   HCT 36.5 02/25/2015 0848     Per Protocol for Patient with estCrcl< 30 ml/min and BMI < 40, will transition to Lovenox 40 mg Q12h.

## 2015-11-13 NOTE — ED Provider Notes (Addendum)
[facility] Emergency Department Provider Note  ____________________________________________   I have reviewed the triage vital signs and the nursing notes.   HISTORY  Chief Complaint Shortness of Breath    HPI Mckenzie Lopez is a 30 y.o. female with a history of hypertension LVH on prior echo with a normal EF, patient has a history of very poorly controlled hypertension morbid obesity she gave birth vaginally on the second of this month, today is the fifth. The patient has been well managed with labetalol during her pregnancy and does not have a history of preeclampsia. This is prescribed and see. The patient states that this evening she began to have shortness of breath a few hours ago. Noticed also orthopnea and some exertional dyspnea. No chest pain. She has a slight cough which is brand-new. She has not had any fever or chills. She feels short of breath only. She denies any nasal discharge or URI symptoms. Denies any unilateral leg swelling or any leg swelling at all. Has not had a history in herself or family or PE or DVT.     Past Medical History  Diagnosis Date  . Low back pain   . Morbid obesity ([place])   . Hyperlipidemia   . Hypertension   . IFG (impaired fasting glucose)   . Migraines     Patient Active Problem List   Diagnosis Date Noted  . Hematuria 02/21/2015  . Vitamin D deficiency 02/21/2015  . Medication monitoring encounter 02/21/2015  . Low back pain   . Morbid obesity ([place])   . Hyperlipidemia   . Hypertension   . IFG (impaired fasting glucose)   . Migraines     No past surgical history on file.  Current Outpatient Rx  Name  Route  Sig  Dispense  Refill  . Cholecalciferol (VITAMIN D) 2000 UNITS CAPS   Oral   Take 2,000 Units by mouth daily.         . hydrocortisone valerate cream (WESTCORT) 0.2 %   Topical   Apply 1 application topically.          Marland Kitchen ketoconazole (NIZORAL) 2 % shampoo   Topical   Apply 1  application topically.          Marland Kitchen labetalol (NORMODYNE) 200 MG tablet   Oral   Take 200 mg by mouth 2 (two) times daily.         . Prenatal Vit-Fe Fumarate-FA (PRENATAL 19) tablet   Oral   Chew by mouth daily.      11     Allergies Chlorthalidone  Family History  Problem Relation Age of Onset  . Diabetes Mother   . Hypertension Mother   . Cancer Maternal Grandmother     breast  . Diabetes Maternal Grandfather   . Hypertension Maternal Grandfather   . Seizures Paternal Grandfather   . Cancer Maternal Aunt     breast  . Heart disease Neg Hx   . Stroke Neg Hx   . COPD Neg Hx     Social History Social History  Substance Use Topics  . Smoking status: Never Smoker   . Smokeless tobacco: Never Used  . Alcohol Use: No    Review of Systems Constitutional: No fever/chills Eyes: No visual changes. ENT: No sore throat. No stiff neck no neck pain Cardiovascular: Denies chest pain. Respiratory: Positive shortness of breath. Gastrointestinal:   no vomiting.  No diarrhea.  No constipation. Genitourinary: Negative for dysuria. Musculoskeletal: Negative lower extremity swelling  Skin: Negative for rash. Neurological: Negative for headaches, focal weakness or numbness. 10-point ROS otherwise negative.  ____________________________________________   PHYSICAL EXAM:  VITAL SIGNS: ED Triage Vitals  Enc Vitals Group     BP 11/13/15 2004 192/107 mmHg     Pulse Rate 11/13/15 2004 75     Resp 11/13/15 2004 18     Temp 11/13/15 2004 98.3 F (36.8 C)     Temp Source 11/13/15 2004 Oral     SpO2 11/13/15 2004 88 %     Weight 11/13/15 2001 305 lb (138.347 kg)     Height 11/13/15 2001 5\' 5"  (1.651 m)     Head Cir --      Peak Flow --      Pain Score 11/13/15 2002 0     Pain Loc --      Pain Edu? --      Excl. in [place]? --     Constitutional: Alert and oriented. Well appearing and in no acute distress.Speaking in full sentences but oxygen saturation 85 on room air has an  occasional wet sounding cough Eyes: Conjunctivae are normal. PERRL. EOMI. Head: Atraumatic. Nose: No congestion/rhinnorhea. Mouth/Throat: Mucous membranes are moist.  Oropharynx non-erythematous. Neck: No stridor.   Nontender with no meningismus Cardiovascular: Normal rate, regular rhythm. Grossly normal heart sounds.  Good peripheral circulation. Respiratory: Diminished in the bases with occasional Rales bilateral no wheeze or rhonchi. Abdominal: Soft and nontender. No distention. No guarding no rebound Back:  There is no focal tenderness or step off there is no midline tenderness there are no lesions noted. there is no CVA tenderness Musculoskeletal: No lower extremity tenderness. No joint effusions, no DVT signs strong distal pulses no edema Neurologic:  Normal speech and language. No gross focal neurologic deficits are appreciated. There is no significant hyperreflexia noted Skin:  Skin is warm, dry and intact. No rash noted. Psychiatric: Mood and affect are normal. Speech and behavior are normal.  ____________________________________________   LABS (all labs ordered are listed, but only abnormal results are displayed)  Labs Reviewed  CBC WITH DIFFERENTIAL/PLATELET - Abnormal; Notable for the following:    WBC 11.1 (*)    HCT 33.6 (*)    RDW 15.3 (*)    Platelets 505 (*)    Neutro Abs 8.6 (*)    All other components within normal limits  CULTURE, BLOOD (ROUTINE X 2)  CULTURE, BLOOD (ROUTINE X 2)  TROPONIN I  BRAIN NATRIURETIC PEPTIDE  COMPREHENSIVE METABOLIC PANEL  PROTIME-INR  APTT  MAGNESIUM  PROTEIN / CREATININE RATIO, URINE  URINALYSIS COMPLETEWITH MICROSCOPIC ([facility] ONLY)   ____________________________________________  EKG  I personally interpreted any EKGs ordered by me or triage Sinus rhythm rate 77 bpm no acute ST elevation or depression, normal axis, unremarkable EKG ____________________________________________  RADIOLOGY  I reviewed any imaging ordered  by me or triage that were performed during my shift and, if possible, patient and/or family made aware of any abnormal findings. ____________________________________________   PROCEDURES  Procedure(s) performed: None  Critical Care performed: CRITICAL CARE Performed by: Schuyler Amor   Total critical care time: 90  minutes  Critical care time was exclusive of separately billable procedures and treating other patients.  Critical care was necessary to treat or prevent imminent or life-threatening deterioration.  Critical care was time spent personally by me on the following activities: development of treatment plan with patient and/or surrogate as well as nursing, discussions with consultants, evaluation of patient's response to treatment, examination of  patient, obtaining history from patient or surrogate, ordering and performing treatments and interventions, ordering and review of laboratory studies, ordering and review of radiographic studies, pulse oximetry and re-evaluation of patient's condition.   ____________________________________________   INITIAL IMPRESSION / ASSESSMENT AND PLAN / ED COURSE  Pertinent labs & imaging results that were available during my care of the patient were reviewed by me and considered in my medical decision making (see chart for details).  Concerning presentation with hypoxia and orthopnea, patient is hypertensive at baseline. Clinically, patient appears to have pulmonary edema on auscultation. This is concerning for possible postpartum cardiomyopathy, at the very least it does appear the patient has symptoms of CHF. Her blood pressure is acutely elevated, baseline is in the 140s to 150s during her pregnancy although that has fluctuated. We will give her hydralazine, I'll start her on a nitro drip, we'll give her Lasix. She is not in acute distress at this time it is my hope that we can forestall worsening condition leg prompt action. She does not  require BiPAP at this moment he does not have significantly increased work of breathing. Concern also exists for preeclampsia however, patient is not markedly hyperreflexive. We will check liver function tests rule out help syndrome, I did discuss with Dr. Marcelline Mates of OB/GYN. She feels that clinically this is much more likely to be postpartum cardiomyopathy then help syndrome given her history, that she does not object to magnesium which I have ordered. In addition, we will check a PC ratio to see if there is other evidence of preeclampsia. I will give the patient nitroglycerin to reduce afterload and we will continue to assess closely. At this time, she is consulted well. Do not think PE is likely given her constellation of symptoms.  ----------------------------------------- 9:23 PM on 11/13/2015 -----------------------------------------  Pt got up to go to the bathroom and became quite winded. Says she was doing better before that. bp trending down after meds.  Still no hyperreflexia.  Will start bipap to forestall deterioration, as she is somewhat more dyspneic after ambulation. Will also place foley..  cxr shows edema.    ----------------------------------------- 9:57 PM on 11/13/2015 -----------------------------------------  I have discussed with Dr. Candis Musa of cardiology, who agrees with our management. Patient's blood pressure is trending down now systolic in the [id number] which is an improvement. We will continue to titrate the drip. Initially I was going to give more hydralazine but I would prefer to use one agent of possible though is not to her out. Patient is on BiPAP and tolerating it well. She is diuresing well after Foley placement   ----------------------------------------- 10:10 PM on 11/13/2015 -----------------------------------------  D/w dr. Corinna Lines of intensive care who agrees w/ Zachery Dakins and asks for hospitalist admission. Does not feel any further intervention or imaging needed at this  time.  Paged hospitalist x 2.   ____________________________________________   FINAL CLINICAL IMPRESSION(S) / ED DIAGNOSES  Final diagnoses:  SOB (shortness of breath)      This chart was dictated using voice recognition software.  Despite best efforts to proofread,  errors can occur which can change meaning.     Schuyler Amor, MD 11/13/15 2101  Schuyler Amor, MD 11/13/15 [id number]  Schuyler Amor, MD 11/13/15 2124  Schuyler Amor, MD 11/13/15 2125  Schuyler Amor, MD 11/13/15 2159  Schuyler Amor, MD 11/13/15 [id number]  Schuyler Amor, MD 11/13/15 2225

## 2015-11-13 NOTE — H&P (Signed)
PCP:   [name], MD   Chief Complaint:  SOB  HPI: This is a 30 year old female who presents with a complaint of shortness of breath that began approximately 6:30 this evening. She reports some wheezing and coughing. The cough is nonproductive. She denies any chest pains. She has never had this before. The patient gave birth 3 days ago at [facility] She is morbidly obese and has a history of hypertension. She states her blood pressure medications were off cycle while she was in the hospital. She was discharged home yesterday. She came to ER where she was diagnosed with pulmonary edema. While pregnant she had a 2-D echo done which showed normal EF. Patient is not breast-feeding.  Review of Systems:  The patient denies anorexia, fever, weight loss,, vision loss, decreased hearing, hoarseness, chest pain, syncope, dyspnea on exertion, peripheral edema, balance deficits, hemoptysis, abdominal pain, melena, hematochezia, severe indigestion/heartburn, hematuria, incontinence, genital sores, muscle weakness, suspicious skin lesions, transient blindness, difficulty walking, depression, unusual weight change, abnormal bleeding, enlarged lymph nodes, angioedema, and breast masses.  Past Medical History: Past Medical History  Diagnosis Date  . Low back pain   . Morbid obesity ([place])   . Hyperlipidemia   . Hypertension   . IFG (impaired fasting glucose)   . Migraines    No past surgical history on file.  Medications: Prior to Admission medications   Medication Sig Start Date End Date Taking? Authorizing Provider  Cholecalciferol (VITAMIN D) 2000 UNITS CAPS Take 2,000 Units by mouth daily.    Historical Provider, MD  hydrocortisone valerate cream (WESTCORT) 0.2 % Apply 1 application topically.  12/28/14   Historical Provider, MD  ketoconazole (NIZORAL) 2 % shampoo Apply 1 application topically.  01/07/15   Historical Provider, MD  labetalol (NORMODYNE) 200 MG tablet Take 200 mg by mouth 2 (two)  times daily. 04/25/15   Historical Provider, MD  Prenatal Vit-Fe Fumarate-FA (PRENATAL 19) tablet Chew by mouth daily. 04/12/15   Historical Provider, MD    Allergies:   Allergies  Allergen Reactions  . Chlorthalidone Other (See Comments)    Severe hypokalemia    Social History:  reports that she has never smoked. She has never used smokeless tobacco. She reports that she does not drink alcohol or use illicit drugs.  Family History: Family History  Problem Relation Age of Onset  . Diabetes Mother   . Hypertension Mother   . Cancer Maternal Grandmother     breast  . Diabetes Maternal Grandfather   . Hypertension Maternal Grandfather   . Seizures Paternal Grandfather   . Cancer Maternal Aunt     breast  . Heart disease Neg Hx   . Stroke Neg Hx   . COPD Neg Hx     Physical Exam: Filed Vitals:   11/13/15 2225 11/13/15 2230 11/13/15 2235 11/13/15 2240  BP: 154/101 162/108 161/103 165/105  Pulse: 76 79 73 72  Temp:      TempSrc:      Resp: 40 33 38 35  Height:      Weight:      SpO2: 98% 99% 99% 99%    General:  Alert and oriented times three, well developed and nourished, BiPAP in place  Eyes: PERRLA, pink conjunctiva, no scleral icterus ENT: Moist oral mucosa, neck supple, no thyromegaly Lungs: clear to ascultation, no wheeze, no crackles, no use of accessory muscles, difficult exam due to patient body habitus Cardiovascular: regular rate and rhythm, no regurgitation, no gallops, no murmurs. No  carotid bruits, no JVD Abdomen: soft, positive BS, non-tender, non-distended, no organomegaly, not an acute abdomen GU: not examined Neuro: CN II - XII grossly intact, sensation intact Musculoskeletal: strength 5/5 all extremities, no clubbing, cyanosis or edema Skin: no rash, no subcutaneous crepitation, no decubitus Psych: appropriate patient   Labs on Admission:   Recent Labs  11/13/15 2021  NA 139  K 3.5  CL 108  CO2 24  GLUCOSE 83  BUN 13  CREATININE 0.79   CALCIUM 8.9  MG 1.5*    Recent Labs  11/13/15 2021  AST 31  ALT 38  ALKPHOS 76  BILITOT 0.6  PROT 6.5  ALBUMIN 3.3*   No results for input(s): LIPASE, AMYLASE in the last 72 hours.  Recent Labs  11/13/15 2021  WBC 11.1*  NEUTROABS 8.6*  HGB 12.0  HCT 33.6*  MCV 80.9  PLT 505*    Recent Labs  11/13/15 2021  TROPONINI 0.06*   Invalid input(s): POCBNP No results for input(s): DDIMER in the last 72 hours. No results for input(s): HGBA1C in the last 72 hours. No results for input(s): CHOL, HDL, LDLCALC, TRIG, CHOLHDL, LDLDIRECT in the last 72 hours. No results for input(s): TSH, T4TOTAL, T3FREE, THYROIDAB in the last 72 hours.  Invalid input(s): FREET3 No results for input(s): VITAMINB12, FOLATE, FERRITIN, TIBC, IRON, RETICCTPCT in the last 72 hours.  Micro Results: No results found for this or any previous visit (from the past 240 hour(s)).   Radiological Exams on Admission: Dg Chest Port 1 View  11/13/2015  CLINICAL DATA:  Short of breath and wheezing EXAM: PORTABLE CHEST 1 VIEW COMPARISON:  11/13/2015 FINDINGS: Cardiac silhouette is enlarged. Initial exam is lordotic. Diffuse fine airspace disease and linear interstitial markings. No pneumothorax. IMPRESSION: Cardiomegaly and interstitial edema suggest mild congestive heart failure. Electronically Signed   By: Suzy Bouchard M.D.   On: 11/13/2015 20:50    Assessment/Plan Present on Admission:  . Pulmonary edema -Admit to stepdown -BiPAP, duonebs, respiratory to evaluate and treat -IV Lasix, strict I's and O's, daily weights -Cycle cardiac enzymes -Cardiology aware Dr. Nyoka Cowden. OB/GYN aware Dr. Marcelline Mates  . HTN (hypertension), malignant -Continue nitroglycerin drip, goal systolic blood pressure approximately 160 -Resume home medication with first dose now -Most likely cause of patient's acute pulmonary edema. We'll rule out postpartum cardiomyopathy -.HELLP syndrome. LFTs are normal as is platelet  count)  Hypomagnesemia -Replete IV therapy levels in a.m., replete levels in AM  Elevated troponin -Mild, monitor in telemetry. Cycle cardiac enzymes -Likely due to pulmonary edema  S/P Vagina; delivery  -aware, OB aware and consulted  ?UTI -repeat UA ordered. Likely contamination.  . Hyperlipidemia -stable, aware  . Morbid obesity ([place]) -   Shataria Crist 11/13/2015, 10:47 PM

## 2015-11-13 NOTE — Progress Notes (Signed)
Transported pt to ICU on Bipap without incident. Pt remains on BIPAP and tol well.

## 2015-11-14 ENCOUNTER — Inpatient Hospital Stay
Admit: 2015-11-14 | Discharge: 2015-11-14 | Disposition: A | Discharge status: Home / Self Care | Payer: 59 | Attending: Cardiology | Admitting: Cardiology

## 2015-11-14 ENCOUNTER — Encounter: Payer: Self-pay | Admitting: *Deleted

## 2015-11-14 ENCOUNTER — Inpatient Hospital Stay: Payer: 59

## 2015-11-14 DIAGNOSIS — I10 Essential (primary) hypertension: Secondary | ICD-10-CM

## 2015-11-14 DIAGNOSIS — J9601 Acute respiratory failure with hypoxia: Secondary | ICD-10-CM

## 2015-11-14 DIAGNOSIS — I160 Hypertensive urgency: Secondary | ICD-10-CM

## 2015-11-14 DIAGNOSIS — E785 Hyperlipidemia, unspecified: Secondary | ICD-10-CM

## 2015-11-14 DIAGNOSIS — J810 Acute pulmonary edema: Secondary | ICD-10-CM

## 2015-11-14 LAB — CBC
HEMATOCRIT: 33.1 % — AB (ref 35.0–47.0)
HEMOGLOBIN: 11.9 g/dL — AB (ref 12.0–16.0)
MCH: 28.9 pg (ref 26.0–34.0)
MCHC: 36 g/dL (ref 32.0–36.0)
MCV: 80.2 fL (ref 80.0–100.0)
Platelets: 539 10*3/uL — ABNORMAL HIGH (ref 150–440)
RBC: 4.12 MIL/uL (ref 3.80–5.20)
RDW: 15.6 % — ABNORMAL HIGH (ref 11.5–14.5)
WBC: 11.4 10*3/uL — ABNORMAL HIGH (ref 3.6–11.0)

## 2015-11-14 LAB — BASIC METABOLIC PANEL
ANION GAP: 10 (ref 5–15)
BUN: 13 mg/dL (ref 6–20)
CALCIUM: 8.4 mg/dL — AB (ref 8.9–10.3)
CO2: 25 mmol/L (ref 22–32)
Chloride: 104 mmol/L (ref 101–111)
Creatinine, Ser: 0.85 mg/dL (ref 0.44–1.00)
GLUCOSE: 82 mg/dL (ref 65–99)
POTASSIUM: 3.1 mmol/L — AB (ref 3.5–5.1)
Sodium: 139 mmol/L (ref 135–145)

## 2015-11-14 LAB — ECHOCARDIOGRAM COMPLETE
AV Area VTI: 2.22 cm2
AV peak Index: 0.96
AV pk vel: 150 cm/s
AVPG: 9 mmHg
Ao pk vel: 0.71 m/s
E decel time: 211 msec
E/e' ratio: 12.34
FS: 36 % (ref 28–44)
HEIGHTINCHES: 65 in
IV/PV OW: 0.96
LA ID, A-P, ES: 45 mm
LA diam index: 1.94 cm/m2
LA vol A4C: 74.8 ml
LDCA: 3.14 cm2
LEFT ATRIUM END SYS DIAM: 45 mm
LV E/e' medial: 12.34
LV PW d: 11.4 mm — AB (ref 0.6–1.1)
LV TDI E'MEDIAL: 4.68
LV e' LATERAL: 8.59 cm/s
LVEEAVG: 12.34
LVOT diameter: 20 mm
LVOTPV: 106 cm/s
MV Dec: 211
MV Peak grad: 4 mmHg
MV pk A vel: 96.5 m/s
MV pk E vel: 106 m/s
RV TAPSE: 11.7 mm
TDI e' lateral: 8.59
WEIGHTICAEL: 4652.59 [oz_av]

## 2015-11-14 LAB — TROPONIN I
TROPONIN I: 0.07 ng/mL — AB (ref <0.03)
TROPONIN I: 0.04 ng/mL — AB (ref <0.03)
Troponin I: 0.12 ng/mL (ref <0.03)

## 2015-11-14 LAB — MAGNESIUM
MAGNESIUM: 2.1 mg/dL (ref 1.7–2.4)
Magnesium: 1.6 mg/dL — ABNORMAL LOW (ref 1.7–2.4)

## 2015-11-14 LAB — FIBRIN DERIVATIVES D-DIMER (ARMC ONLY): FIBRIN DERIVATIVES D-DIMER (ARMC): 1299 — AB (ref 0–499)

## 2015-11-14 LAB — MRSA PCR SCREENING: MRSA by PCR: NEGATIVE

## 2015-11-14 LAB — TSH: TSH: 1.925 u[IU]/mL (ref 0.350–4.500)

## 2015-11-14 LAB — POTASSIUM: POTASSIUM: 3.1 mmol/L — AB (ref 3.5–5.1)

## 2015-11-14 MED ORDER — MAGNESIUM SULFATE 2 GM/50ML IV SOLN
2.0000 g | Freq: Once | INTRAVENOUS | Status: AC
  Administered 2015-11-14: 2 g via INTRAVENOUS
  Filled 2015-11-14: qty 50

## 2015-11-14 MED ORDER — CARVEDILOL 6.25 MG PO TABS
12.5000 mg | ORAL_TABLET | Freq: Two times a day (BID) | ORAL | Status: DC
  Administered 2015-11-14 – 2015-11-15 (×2): 12.5 mg via ORAL
  Filled 2015-11-14 (×2): qty 2

## 2015-11-14 MED ORDER — CETYLPYRIDINIUM CHLORIDE 0.05 % MT LIQD: 7.0000 mL | Freq: Two times a day (BID) | OROMUCOSAL | Status: DC

## 2015-11-14 MED ORDER — HYDRALAZINE HCL 20 MG/ML IJ SOLN
10.0000 mg | INTRAMUSCULAR | Status: DC | PRN
  Administered 2015-11-14 – 2015-11-15 (×2): 10 mg via INTRAVENOUS
  Filled 2015-11-14 (×2): qty 1

## 2015-11-14 MED ORDER — POTASSIUM CHLORIDE CRYS ER 20 MEQ PO TBCR
20.0000 meq | EXTENDED_RELEASE_TABLET | Freq: Once | ORAL | Status: AC
  Administered 2015-11-14: 20 meq via ORAL
  Filled 2015-11-14: qty 1

## 2015-11-14 MED ORDER — PROMETHAZINE HCL 25 MG/ML IJ SOLN
12.5000 mg | Freq: Four times a day (QID) | INTRAMUSCULAR | Status: DC | PRN
  Administered 2015-11-14: 12.5 mg via INTRAVENOUS
  Filled 2015-11-14: qty 1

## 2015-11-14 MED ORDER — POTASSIUM CHLORIDE CRYS ER 20 MEQ PO TBCR
40.0000 meq | EXTENDED_RELEASE_TABLET | Freq: Once | ORAL | Status: AC
  Administered 2015-11-14: 40 meq via ORAL
  Filled 2015-11-14: qty 2

## 2015-11-14 MED ORDER — CHLORHEXIDINE GLUCONATE 0.12 % MT SOLN
15.0000 mL | Freq: Two times a day (BID) | OROMUCOSAL | Status: DC
  Administered 2015-11-14: 15 mL via OROMUCOSAL

## 2015-11-14 MED ORDER — POTASSIUM CHLORIDE CRYS ER 20 MEQ PO TBCR
20.0000 meq | EXTENDED_RELEASE_TABLET | Freq: Two times a day (BID) | ORAL | Status: AC
  Administered 2015-11-14 (×2): 20 meq via ORAL
  Filled 2015-11-14 (×2): qty 1

## 2015-11-14 MED ORDER — LISINOPRIL 10 MG PO TABS: 10.0000 mg | ORAL_TABLET | Freq: Every day | ORAL | Status: DC

## 2015-11-14 NOTE — Care Management (Signed)
Patient gave birth 3 days prior to presentation at [facility].  Was admitted to icu stepdown on nitroglycerin drip which has since been stopped. Being treated for pulmonary edema/mild congestive heart failure with malignant hypertension.  Work up in progress to rule out pregnancy induced cardiomyopathy.

## 2015-11-14 NOTE — Progress Notes (Signed)
Per Hinton Dyer NP, Ms. Piccione is able to travel to ultrasound department without RN accompanying her.

## 2015-11-14 NOTE — Progress Notes (Signed)
After speaking with RN on Mother-Baby unit about patient not breastfeeding and patient's concern for engorgment (not engorged at this time, but concerned it could happen while she's in the hospital), RN obtained head of cabbage from dietary and offered patient ice packs. Patient stated that currently she did not want the ice packs or cabbage but that she would inform RN if she feels any discomfort or changes her mind about ice packs or cabbage leaves.

## 2015-11-14 NOTE — Consult Note (Signed)
Mckenzie Lopez is a 30 y.o. female  [id number]  Primary Cardiologist: Neoma Laming Reason for Consultation: Pulmonary edema  HPI:  Ms. Papworth presented to [facility] with complaints of shortness of breath that she began to notice last evening. She noted difficulty breathing on exertion and a nonproductive cough. She denies any chest pain or tightness or edema. She has never experienced this before. She has a history of hypertension and has recently given birth 3 days ago at [facility] through normal vaginal delivery without complications. She reports that her blood pressure has not been difficult to control prior to or during pregnancy. Her blood pressure medications did get off schedule during her hospitalization for childbirth. During her pregnancy her obstetrician checked a 2-D echo and found a normal EF but the patient states that he told her that her right and left side were beating differently and that she should have this followed up in the future.    Review of Systems: Negative for chest pain/pressure or tightness. Negative for recent edema. Negative for lightheadedness or dizziness. Positive for shortness of breath on exertion and nonproductive cough.   Past Medical History  Diagnosis Date  . Low back pain   . Morbid obesity ([place])   . Hyperlipidemia   . Hypertension   . IFG (impaired fasting glucose)   . Migraines     Medications Prior to Admission  Medication Sig Dispense Refill  . Cholecalciferol (VITAMIN D) 2000 UNITS CAPS Take 2,000 Units by mouth daily.    . folic acid (FOLVITE) 1 MG tablet Take 1 mg by mouth daily.  11  . hydrocortisone valerate cream (WESTCORT) 0.2 % Apply 1 application topically as needed (itching).     . labetalol (NORMODYNE) 200 MG tablet Take 200 mg by mouth 2 (two) times daily.    . Pediatric Multiple Vit-C-FA (FLINSTONES GUMMIES OMEGA-3 DHA) CHEW Chew 1 tablet by mouth daily.       Marland Kitchen enoxaparin (LOVENOX) injection  40  mg Subcutaneous Q12H  . furosemide  40 mg Intravenous Q12H  . labetalol  200 mg Oral BID  . potassium chloride  40 mEq Oral Once  . sodium chloride flush  3 mL Intravenous Q12H    Infusions: . nitroGLYCERIN 80 mcg/min (11/14/15 0900)    Allergies  Allergen Reactions  . Chlorthalidone Other (See Comments)    Severe hypokalemia    Social History   Social History  . Marital Status: Single    Spouse Name: N/A  . Number of Children: N/A  . Years of Education: N/A   Occupational History  . Not on file.   Social History Main Topics  . Smoking status: Never Smoker   . Smokeless tobacco: Never Used  . Alcohol Use: No  . Drug Use: No  . Sexual Activity: Not on file   Other Topics Concern  . Not on file   Social History Narrative    Family History  Problem Relation Age of Onset  . Diabetes Mother   . Hypertension Mother   . Cancer Maternal Grandmother     breast  . Diabetes Maternal Grandfather   . Hypertension Maternal Grandfather   . Seizures Paternal Grandfather   . Cancer Maternal Aunt     breast  . Heart disease Neg Hx   . Stroke Neg Hx   . COPD Neg Hx     PHYSICAL EXAM: Filed Vitals:   11/14/15 0730 11/14/15 0815  BP: 162/95   Pulse: 86  Temp:  98.7 F (37.1 C)  Resp: 27      Intake/Output Summary (Last 24 hours) at 11/14/15 0944 Last data filed at 11/14/15 0800  Gross per 24 hour  Intake 182.57 ml  Output   4875 ml  Net -4692.43 ml    General:  Well appearing. No respiratory difficulty HEENT: normal Neck: supple. no JVD. Carotids 2+ bilat; no bruits. No lymphadenopathy or thryomegaly appreciated. Cor: PMI nondisplaced. Regular rate & rhythm. No rubs, gallops or murmurs. Lungs: clear Abdomen: soft, nontender, nondistended. No hepatosplenomegaly. No bruits or masses. Good bowel sounds. Extremities: no cyanosis, clubbing, rash, edema Neuro: alert & oriented x 3, cranial nerves grossly intact. moves all 4 extremities w/o difficulty. Affect  pleasant.  ECG: Normal sinus rhythm, poor R-wave progression  Results for orders placed or performed during the hospital encounter of 11/13/15 (from the past 24 hour(s))  Troponin I     Status: Abnormal   Collection Time: 11/13/15  8:21 PM  Result Value Ref Range   Troponin I 0.06 (HH) <0.03 ng/mL  Brain natriuretic peptide     Status: Abnormal   Collection Time: 11/13/15  8:21 PM  Result Value Ref Range   B Natriuretic Peptide 594.0 (H) 0.0 - 100.0 pg/mL  CBC with Differential     Status: Abnormal   Collection Time: 11/13/15  8:21 PM  Result Value Ref Range   WBC 11.1 (H) 3.6 - 11.0 K/uL   RBC 4.16 3.80 - 5.20 MIL/uL   Hemoglobin 12.0 12.0 - 16.0 g/dL   HCT 33.6 (L) 35.0 - 47.0 %   MCV 80.9 80.0 - 100.0 fL   MCH 28.9 26.0 - 34.0 pg   MCHC 35.8 32.0 - 36.0 g/dL   RDW 15.3 (H) 11.5 - 14.5 %   Platelets 505 (H) 150 - 440 K/uL   Neutrophils Relative % 78 %   Neutro Abs 8.6 (H) 1.4 - 6.5 K/uL   Lymphocytes Relative 12 %   Lymphs Abs 1.3 1.0 - 3.6 K/uL   Monocytes Relative 6 %   Monocytes Absolute 0.7 0.2 - 0.9 K/uL   Eosinophils Relative 3 %   Eosinophils Absolute 0.3 0 - 0.7 K/uL   Basophils Relative 1 %   Basophils Absolute 0.1 0 - 0.1 K/uL  Comprehensive metabolic panel     Status: Abnormal   Collection Time: 11/13/15  8:21 PM  Result Value Ref Range   Sodium 139 135 - 145 mmol/L   Potassium 3.5 3.5 - 5.1 mmol/L   Chloride 108 101 - 111 mmol/L   CO2 24 22 - 32 mmol/L   Glucose, Bld 83 65 - 99 mg/dL   BUN 13 6 - 20 mg/dL   Creatinine, Ser 0.79 0.44 - 1.00 mg/dL   Calcium 8.9 8.9 - 10.3 mg/dL   Total Protein 6.5 6.5 - 8.1 g/dL   Albumin 3.3 (L) 3.5 - 5.0 g/dL   AST 31 15 - 41 U/L   ALT 38 14 - 54 U/L   Alkaline Phosphatase 76 38 - 126 U/L   Total Bilirubin 0.6 0.3 - 1.2 mg/dL   GFR calc non Af Amer >60 >60 mL/min   GFR calc Af Amer >60 >60 mL/min   Anion gap 7 5 - 15  Protime-INR     Status: Abnormal   Collection Time: 11/13/15  8:21 PM  Result Value Ref Range    Prothrombin Time 15.5 (H) 11.4 - 15.0 seconds   INR 1.21   APTT  Status: None   Collection Time: 11/13/15  8:21 PM  Result Value Ref Range   aPTT 32 24 - 36 seconds  Magnesium     Status: Abnormal   Collection Time: 11/13/15  8:21 PM  Result Value Ref Range   Magnesium 1.5 (L) 1.7 - 2.4 mg/dL  Culture, blood (routine x 2)     Status: None (Preliminary result)   Collection Time: 11/13/15  8:55 PM  Result Value Ref Range   Specimen Description BLOOD LT HAND    Special Requests BOTTLES DRAWN AEROBIC AND ANAEROBIC 3CC    Culture NO GROWTH < 12 HOURS    Report Status PENDING   Culture, blood (routine x 2)     Status: None (Preliminary result)   Collection Time: 11/13/15  8:55 PM  Result Value Ref Range   Specimen Description BLOOD RIGHT ASSIST CONTROL    Special Requests      BOTTLES DRAWN AEROBIC AND ANAEROBIC  AERO [place] ANA 20CC   Culture NO GROWTH < 12 HOURS    Report Status PENDING   Protein / creatinine ratio, urine     Status: Abnormal   Collection Time: 11/13/15  8:57 PM  Result Value Ref Range   Creatinine, Urine 51 mg/dL   Total Protein, Urine 17 mg/dL   Protein Creatinine Ratio 0.33 (H) 0.00 - 0.15 mg/mg[Cre]  Urinalysis complete, with microscopic     Status: Abnormal   Collection Time: 11/13/15  8:57 PM  Result Value Ref Range   Color, Urine YELLOW (A) YELLOW   APPearance HAZY (A) CLEAR   Glucose, UA NEGATIVE NEGATIVE mg/dL   Bilirubin Urine NEGATIVE NEGATIVE   Ketones, ur NEGATIVE NEGATIVE mg/dL   Specific Gravity, Urine 1.009 1.005 - 1.030   Hgb urine dipstick 3+ (A) NEGATIVE   pH 6.0 5.0 - 8.0   Protein, ur NEGATIVE NEGATIVE mg/dL   Nitrite POSITIVE (A) NEGATIVE   Leukocytes, UA 3+ (A) NEGATIVE   RBC / HPF TOO NUMEROUS TO COUNT 0 - 5 RBC/hpf   WBC, UA TOO NUMEROUS TO COUNT 0 - 5 WBC/hpf   Bacteria, UA RARE (A) NONE SEEN   Squamous Epithelial / LPF 0-5 (A) NONE SEEN  Urinalysis complete, with microscopic     Status: Abnormal   Collection Time: 11/13/15  10:17 PM  Result Value Ref Range   Color, Urine COLORLESS (A) YELLOW   APPearance CLEAR (A) CLEAR   Glucose, UA NEGATIVE NEGATIVE mg/dL   Bilirubin Urine NEGATIVE NEGATIVE   Ketones, ur NEGATIVE NEGATIVE mg/dL   Specific Gravity, Urine 1.004 (L) 1.005 - 1.030   Hgb urine dipstick 2+ (A) NEGATIVE   pH 6.0 5.0 - 8.0   Protein, ur NEGATIVE NEGATIVE mg/dL   Nitrite NEGATIVE NEGATIVE   Leukocytes, UA NEGATIVE NEGATIVE   RBC / HPF TOO NUMEROUS TO COUNT 0 - 5 RBC/hpf   WBC, UA 0-5 0 - 5 WBC/hpf   Bacteria, UA NONE SEEN NONE SEEN   Squamous Epithelial / LPF NONE SEEN NONE SEEN  Protein / creatinine ratio, urine     Status: None   Collection Time: 11/13/15 10:17 PM  Result Value Ref Range   Creatinine, Urine <10 mg/dL   Total Protein, Urine <6 mg/dL   Protein Creatinine Ratio        0.00 - 0.15 mg/mg[Cre]  Glucose, capillary     Status: None   Collection Time: 11/13/15 11:39 PM  Result Value Ref Range   Glucose-Capillary 84 65 - 99 mg/dL  MRSA PCR Screening     Status: None   Collection Time: 11/13/15 11:52 PM  Result Value Ref Range   MRSA by PCR NEGATIVE NEGATIVE  Troponin I     Status: Abnormal   Collection Time: 11/14/15 12:05 AM  Result Value Ref Range   Troponin I 0.12 (HH) <0.03 ng/mL  TSH     Status: None   Collection Time: 11/14/15 12:05 AM  Result Value Ref Range   TSH 1.925 0.350 - 4.500 uIU/mL  Basic metabolic panel     Status: Abnormal   Collection Time: 11/14/15  5:36 AM  Result Value Ref Range   Sodium 139 135 - 145 mmol/L   Potassium 3.1 (L) 3.5 - 5.1 mmol/L   Chloride 104 101 - 111 mmol/L   CO2 25 22 - 32 mmol/L   Glucose, Bld 82 65 - 99 mg/dL   BUN 13 6 - 20 mg/dL   Creatinine, Ser 0.85 0.44 - 1.00 mg/dL   Calcium 8.4 (L) 8.9 - 10.3 mg/dL   GFR calc non Af Amer >60 >60 mL/min   GFR calc Af Amer >60 >60 mL/min   Anion gap 10 5 - 15  CBC     Status: Abnormal   Collection Time: 11/14/15  5:36 AM  Result Value Ref Range   WBC 11.4 (H) 3.6 - 11.0 K/uL    RBC 4.12 3.80 - 5.20 MIL/uL   Hemoglobin 11.9 (L) 12.0 - 16.0 g/dL   HCT 33.1 (L) 35.0 - 47.0 %   MCV 80.2 80.0 - 100.0 fL   MCH 28.9 26.0 - 34.0 pg   MCHC 36.0 32.0 - 36.0 g/dL   RDW 15.6 (H) 11.5 - 14.5 %   Platelets 539 (H) 150 - 440 K/uL  Troponin I     Status: Abnormal   Collection Time: 11/14/15  5:36 AM  Result Value Ref Range   Troponin I 0.07 (HH) <0.03 ng/mL  Magnesium     Status: Abnormal   Collection Time: 11/14/15  5:36 AM  Result Value Ref Range   Magnesium 1.6 (L) 1.7 - 2.4 mg/dL   Dg Chest Port 1 View  11/13/2015  CLINICAL DATA:  Short of breath and wheezing EXAM: PORTABLE CHEST 1 VIEW COMPARISON:  11/13/2015 FINDINGS: Cardiac silhouette is enlarged. Initial exam is lordotic. Diffuse fine airspace disease and linear interstitial markings. No pneumothorax. IMPRESSION: Cardiomegaly and interstitial edema suggest mild congestive heart failure. Electronically Signed   By: Suzy Bouchard M.D.   On: 11/13/2015 20:50     ASSESSMENT AND PLAN:  -Pulmonary edema with exertional shortness of breath, elevated BNP of 594, interstitial edema suggesting mild congestive heart failure, and status post recent childbirth. Patient is receiving Lasix 40 mg IV every 12 hours and hospitalist is managing her electrolyte status. Echocardiogram has been ordered to assess for possible postpartum cardiomyopathy.  -Accelerated hypertension possibly related to an alteration in her medication schedule. Blood pressures in the 180s over 100s on admission now with systolic blood pressure in the 140s on nitroglycerin drip. She has been placed on her oral antihypertensive medications and should be able to be weaned from the nitroglycerin drip once blood pressure better controlled.  -Elevated troponins. The patient has had no chest pain or pressure and the troponins are likely related to demand ischemia due to pulmonary edema. Advise full workup as an outpatient once discharged.    [name],  NP 11/14/2015 9:44 AM

## 2015-11-14 NOTE — Consult Note (Signed)
PULMONARY / CRITICAL CARE MEDICINE   Name: Mckenzie Lopez MRN: [id number] DOB: Dec 18, 1985    ADMISSION DATE:  11/13/2015 CONSULTATION DATE:  11/14/2015  REFERRING MD:  Dr. Claria Dice  CHIEF COMPLAINT: Shortness of breath  HISTORY OF PRESENT ILLNESS:   This is a 30 yo female with a PMH of morbid obesity, hyperlipidemia, HTN LVH on prior echo with a normal EF, migraines, impaired fasting glucose, and low back pain.  She presented to [facility] on 7/5 with c/o shortness of breath, wheezing, and a slight nonproductive cough that lasted a few hours.  She gave birth vaginally on 11/10/2015 at [facility] with no complications.  During pregnancy pts blood pressure was well controlled with labetalol and no history of preeclampsia. PCCM consulted 7/6 for management of acute respiratory failure secondary to pulmonary edema requiring Bipap.    PAST MEDICAL HISTORY :  She  has a past medical history of Low back pain; Morbid obesity ([place]); Hyperlipidemia; Hypertension; IFG (impaired fasting glucose); and Migraines.  PAST SURGICAL HISTORY: She  has no past surgical history on file.  Allergies  Allergen Reactions  . Chlorthalidone Other (See Comments)    Severe hypokalemia    No current facility-administered medications on file prior to encounter.   Current Outpatient Prescriptions on File Prior to Encounter  Medication Sig  . Cholecalciferol (VITAMIN D) 2000 UNITS CAPS Take 2,000 Units by mouth daily.  . hydrocortisone valerate cream (WESTCORT) 0.2 % Apply 1 application topically as needed (itching).   . labetalol (NORMODYNE) 200 MG tablet Take 200 mg by mouth 2 (two) times daily.    FAMILY HISTORY:  Her indicated that her mother is alive. She indicated that her father is alive.   SOCIAL HISTORY: She  reports that she has never smoked. She has never used smokeless tobacco. She reports that she does not drink alcohol or use illicit drugs.  REVIEW OF SYSTEMS:  Positives in BOLD Gen: Denies  fever, chills, weight change, fatigue, night sweats HEENT: Denies blurred vision, double vision, hearing loss, tinnitus, sinus congestion, rhinorrhea, sore throat, neck stiffness, dysphagia PULM: shortness of breath, cough, sputum production, hemoptysis, wheezing CV: Denies chest pain, edema, orthopnea, paroxysmal nocturnal dyspnea, palpitations GI: Denies abdominal pain, nausea, vomiting, diarrhea, hematochezia, melena, constipation, change in bowel habits GU: Denies dysuria, hematuria, polyuria, oliguria, urethral discharge Endocrine: Denies hot or cold intolerance, polyuria, polyphagia or appetite change Derm: Denies rash, dry skin, scaling or peeling skin change Heme: Denies easy bruising, bleeding, bleeding gums Neuro: Denies headache, numbness, weakness, slurred speech, loss of memory or consciousness   SUBJECTIVE:  Pt resting in bed on room air no acute distress.  VITAL SIGNS: BP 162/95 mmHg  Pulse 86  Temp(Src) 98.7 F (37.1 C) (Oral)  Resp 27  Ht 5\' 5"  (1.651 m)  Wt 290 lb 12.6 oz (131.9 kg)  BMI 48.39 kg/m2  SpO2 96%  LMP 12/20/2014 (Exact Date)  Breastfeeding? Unknown  HEMODYNAMICS:    VENTILATOR SETTINGS: Vent Mode:  [-]  FiO2 (%):  [35 %] 35 %  INTAKE / OUTPUT: I/O last 3 completed shifts: In: 147.6 [I.V.:97.6; IV Piggyback:50] Out: [id number] [Urine:2875; Drains:2000]  PHYSICAL EXAMINATION: General:  Well developed, well nourished Neuro: alert and oriented, follows commands HEENT:  Supple, no JVD Cardiovascular: s1s2, rrr, no M/R/G Lungs:  Clear throughout, even, non labored Abdomen:  Obese, hypoactive BS x4, non tender, non distended Musculoskeletal: normal tone Skin:  Intact, no rashes or lesions  LABS:  BMET  Recent Labs Lab 11/13/15 2021 11/14/15  0536  NA 139 139  K 3.5 3.1*  CL 108 104  CO2 24 25  BUN 13 13  CREATININE 0.79 0.85  GLUCOSE 83 82    Electrolytes  Recent Labs Lab 11/13/15 2021 11/14/15 0536  CALCIUM 8.9 8.4*  MG 1.5*  1.6*    CBC  Recent Labs Lab 11/13/15 2021 11/14/15 0536  WBC 11.1* 11.4*  HGB 12.0 11.9*  HCT 33.6* 33.1*  PLT 505* 539*    Coag's  Recent Labs Lab 11/13/15 2021  APTT 32  INR 1.21    Sepsis Markers No results for input(s): LATICACIDVEN, PROCALCITON, O2SATVEN in the last 168 hours.  ABG No results for input(s): PHART, PCO2ART, PO2ART in the last 168 hours.  Liver Enzymes  Recent Labs Lab 11/13/15 2021  AST 31  ALT 38  ALKPHOS 76  BILITOT 0.6  ALBUMIN 3.3*    Cardiac Enzymes  Recent Labs Lab 11/13/15 2021 11/14/15 0005 11/14/15 0536  TROPONINI 0.06* 0.12* 0.07*    Glucose  Recent Labs Lab 11/13/15 2339  GLUCAP 84    Imaging Dg Chest Port 1 View  11/13/2015  CLINICAL DATA:  Short of breath and wheezing EXAM: PORTABLE CHEST 1 VIEW COMPARISON:  11/13/2015 FINDINGS: Cardiac silhouette is enlarged. Initial exam is lordotic. Diffuse fine airspace disease and linear interstitial markings. No pneumothorax. IMPRESSION: Cardiomegaly and interstitial edema suggest mild congestive heart failure. Electronically Signed   By: Suzy Bouchard M.D.   On: 11/13/2015 20:50     STUDIES:  Echo 7/6>>  CULTURES: Blood cultures x2>> negative   ANTIBIOTICS: None  SIGNIFICANT EVENTS: -7/5 pt admitted to ICU due to acute respiratory failure secondary to pulmonary edema requiring bipap -7/6 PCCM consulted   LINES/TUBES: PIV's  DISCUSSION: 30 yo female who presented to [facility] on 7/5 with c/o shortness of breath, wheezing, and a slight nonproductive cough that lasted a few hours.  She gave birth vaginally on 11/10/2015 at [facility].  PCCM consulted 7/6 for management of acute respiratory failure secondary to pulmonary edema requiring Bipap.  ASSESSMENT / PLAN:  PULMONARY A: Acute respiratory failure secondary to pulmonary edema P:   IV lasix Bipap prn Supplemental O2 to maintain O2 sats 92% or higher or for dyspnea  CARDIOVASCULAR A:   Hypertension Hx: Hyperlipidemia P:  Continue labetalol  PRN Hydralazine Nitroglycerin drip-goal systolic bp [id number] Trend troponin's  Echo pending  Fibrin derivatives D-Dimer (Wells Score-low risk for DVT)  Cardiology consulted appreciate input Obstetrics/gynecology consulted appreciate input  RENAL A:   Hypokalemia Hypomagnesium  P:   Trend BMP Replace electrolytes as indicated Monitor UOP Daily weights  GASTROINTESTINAL A: Postpartum (Vaginal delivery 11/10/2015)  Nausea P:   Obstetrics/gynecology consulted appreciate input PRN Phenergan for nausea Heart healthy diet as tolerated   HEMATOLOGIC A:   Anemia P:  Lovenox for VTE prophylaxis  Monitor for s/sx of bleeding Transfuse for HgB of <7  INFECTIOUS A:  Mild leukocytosis P:   Trend WBC and monitor fever curve  ENDOCRINE A:   Impaired fasting glucose P:   Monitor serum glucose Hyper/Hypoglycemic protocol  NEUROLOGIC A:   No acute issues    FAMILY  - Updates: Updated pt about plan of care and questions answered   - Inter-disciplinary family meet or Palliative Care meeting due by:  11/20/2015   Marda Stalker, AGNP  Pulmonary/Critical Care  STAFF NOTE: I, Dr. Vilinda Boehringer have personally reviewed patient's available data, including medical history, events of note, physical examination and test results as part of  my evaluation. I have discussed with NP Ethlyn Gallery  and other care providers such as pharmacist, RN and RRT.  In addition,  I personally evaluated patient and elicited key findings of   HPI:  30 yo female with PMHx of HTN, HLD, morbid obesity, recent pregnancy (s/p vaginal birth of baby girl on 11/10/15), seen in consultation for dyspnea in the setting of HTN urgency. She presented to [facility] on 7/5 with c/o shortness of breath, wheezing, and a slight nonproductive cough that lasted a few hours. She gave birth vaginally on 11/10/2015 at [facility] with no complications. During pregnancy  pts blood pressure was well controlled with labetalol and no history of preeclampsia. PCCM consulted 7/6 for management of acute respiratory failure secondary to pulmonary edema requiring Bipap. Only need bipap for 2 hrs, has been on Room air. No major complaints. Non smoker  PE GEN - NAD HEENT - PERRLA, [place]/AT, no lesion CVS - s1, s2, no murmurs LUNGS - diminished BS basilar, no wheezing, good airway movement ABD - soft, NT, obese appearance, +BS EXT - no edema  A:30 yo female with PMHx of HTN, Obesity, Mildly reduced EF (50%), seen in consultation for dyspnea  HTN Urgency Acute respiratory failure Mild pulmonary interstitial edema Dyspnea - resovled Obesity ?OSA - no sleep study.  HTN HLD   P:   - goal BP in the first 24 hrs sbp 140-160, then can titrate nitro gtt to baseline sBp 120-140 - PRN hydralazine for SBP >160 - on RA now, no longer requiring Bipap. BP management is paramount for a stable respiratory status. - has risk factors for OSA (snoring, HTN, elevated BMI, inc neck size), this can be worked up as outpatient. For the inpatient setting will place her on autocpap at night  - maintain O2>92% - Wells score for PE = 1, low risk. Check Ddimer, if elevated then check Bilateral LE U/S  .  Rest per NP/medical resident whose note is outlined above and that I agree with  The patient is critically ill with multiple organ systems failure and requires high complexity decision making for assessment and support, frequent evaluation and titration of therapies, application of advanced monitoring technologies and extensive interpretation of multiple databases.   Critical Care Time devoted to patient care services described in this note is  45 Minutes.   This time reflects time of care of this signee Dr Vilinda Boehringer.  This critical care time does not reflect procedure time, or teaching time or supervisory time of PA/NP/Med-student/Med Resident etc but could involve care discussion  time.  Vilinda Boehringer, MD [place] [facility] Pager [phone] (please enter 7-digits) On Call Pager [phone] (please enter 7-digits)  Note: This note was prepared with Dragon dictation along with smaller phrase technology. Any transcriptional errors that result from this process are unintentional.

## 2015-11-14 NOTE — Progress Notes (Signed)
Per pt request and suggestion from mother baby unit, cabbage leaves placed in bra to help with discomfort from milk production and not breastfeeding.

## 2015-11-14 NOTE — Progress Notes (Signed)
Patient is a 30 year old status post recent delivery admitted with acute CHF  1. Acute CHF possibly due to postpartum cardiomyopathy,  Now resolved await echocardiogram of the heart  2. Accelerated hypertension wean off nitroglycerin drip Start patient on Coreg and lisinopril  3 hypomagnesemia replace  4. Hypokalemia replaced

## 2015-11-14 NOTE — Progress Notes (Signed)
*  PRELIMINARY RESULTS* Echocardiogram 2D Echocardiogram has been performed.  [name] 11/14/2015, 2:39 PM

## 2015-11-15 ENCOUNTER — Inpatient Hospital Stay: Payer: 59

## 2015-11-15 DIAGNOSIS — G479 Sleep disorder, unspecified: Secondary | ICD-10-CM

## 2015-11-15 LAB — BASIC METABOLIC PANEL
ANION GAP: 8 (ref 5–15)
BUN: 13 mg/dL (ref 6–20)
CALCIUM: 8.7 mg/dL — AB (ref 8.9–10.3)
CO2: 24 mmol/L (ref 22–32)
Chloride: 104 mmol/L (ref 101–111)
Creatinine, Ser: 0.86 mg/dL (ref 0.44–1.00)
GFR calc Af Amer: >60 mL/min (ref >60)
GLUCOSE: 81 mg/dL (ref 65–99)
POTASSIUM: 4.1 mmol/L (ref 3.5–5.1)
Sodium: 136 mmol/L (ref 135–145)

## 2015-11-15 LAB — MAGNESIUM: Magnesium: 1.9 mg/dL (ref 1.7–2.4)

## 2015-11-15 LAB — PHOSPHORUS: Phosphorus: 3.6 mg/dL (ref 2.5–4.6)

## 2015-11-15 MED ORDER — HYDRALAZINE HCL 25 MG PO TABS
25.0000 mg | ORAL_TABLET | Freq: Three times a day (TID) | ORAL | Status: DC
  Administered 2015-11-15: 25 mg via ORAL
  Filled 2015-11-15 (×2): qty 1

## 2015-11-15 MED ORDER — HYDRALAZINE HCL 50 MG PO TABS
50.0000 mg | ORAL_TABLET | Freq: Three times a day (TID) | ORAL | Status: DC
  Administered 2015-11-15: 25 mg via ORAL
  Filled 2015-11-15: qty 1

## 2015-11-15 MED ORDER — FLINSTONES GUMMIES OMEGA-3 DHA PO CHEW: 1.0000 | CHEWABLE_TABLET | Freq: Every day | ORAL | Status: DC

## 2015-11-15 MED ORDER — HYDRALAZINE HCL 50 MG PO TABS
100.0000 mg | ORAL_TABLET | Freq: Three times a day (TID) | ORAL | Status: DC
  Administered 2015-11-15 – 2015-11-16 (×3): 100 mg via ORAL
  Filled 2015-11-15 (×3): qty 2

## 2015-11-15 MED ORDER — FUROSEMIDE 40 MG PO TABS
40.0000 mg | ORAL_TABLET | Freq: Two times a day (BID) | ORAL | Status: DC
  Administered 2015-11-15 – 2015-11-16 (×3): 40 mg via ORAL
  Filled 2015-11-15 (×3): qty 1

## 2015-11-15 MED ORDER — CLONIDINE HCL 0.1 MG PO TABS: 0.2000 mg | ORAL_TABLET | Freq: Four times a day (QID) | ORAL | Status: DC | PRN

## 2015-11-15 MED ORDER — LISINOPRIL 20 MG PO TABS: 20.0000 mg | ORAL_TABLET | Freq: Every day | ORAL | Status: DC

## 2015-11-15 MED ORDER — FUROSEMIDE 10 MG/ML IJ SOLN
40.0000 mg | Freq: Two times a day (BID) | INTRAMUSCULAR | Status: DC
  Filled 2015-11-15: qty 4

## 2015-11-15 MED ORDER — LABETALOL HCL 200 MG PO TABS
300.0000 mg | ORAL_TABLET | Freq: Three times a day (TID) | ORAL | Status: DC
Start: 2015-11-15 — End: 2015-11-16
  Administered 2015-11-15 – 2015-11-16 (×4): 300 mg via ORAL
  Filled 2015-11-15: qty 1.5
  Filled 2015-11-15 (×2): qty 2
  Filled 2015-11-15 (×2): qty 1
  Filled 2015-11-15: qty 2

## 2015-11-15 MED ORDER — CARVEDILOL 25 MG PO TABS: 25.0000 mg | ORAL_TABLET | Freq: Two times a day (BID) | ORAL | Status: DC

## 2015-11-15 MED ORDER — PRENATAL MULTIVITAMIN CH
1.0000 | ORAL_TABLET | Freq: Every day | ORAL | Status: DC
  Filled 2015-11-15 (×2): qty 1

## 2015-11-15 MED ORDER — IOPAMIDOL (ISOVUE-370) INJECTION 76%
75.0000 mL | Freq: Once | INTRAVENOUS | Status: AC | PRN
  Administered 2015-11-15: 75 mL via INTRAVENOUS

## 2015-11-15 MED ORDER — IOPAMIDOL (ISOVUE-370) INJECTION 76%
100.0000 mL | Freq: Once | INTRAVENOUS | Status: AC | PRN
  Administered 2015-11-15: 100 mL via INTRAVENOUS

## 2015-11-15 MED ORDER — HYDRALAZINE HCL 20 MG/ML IJ SOLN
20.0000 mg | Freq: Once | INTRAMUSCULAR | Status: AC
  Administered 2015-11-15: 20 mg via INTRAVENOUS
  Filled 2015-11-15: qty 1

## 2015-11-15 MED ORDER — FOLIC ACID 1 MG PO TABS
1.0000 mg | ORAL_TABLET | Freq: Every day | ORAL | Status: DC
  Administered 2015-11-15 – 2015-11-16 (×2): 1 mg via ORAL
  Filled 2015-11-15 (×2): qty 1

## 2015-11-15 MED ORDER — AMLODIPINE BESYLATE 10 MG PO TABS
10.0000 mg | ORAL_TABLET | Freq: Every day | ORAL | Status: DC
Start: 2015-11-15 — End: 2015-11-16
  Administered 2015-11-15 – 2015-11-16 (×2): 10 mg via ORAL
  Filled 2015-11-15 (×2): qty 1

## 2015-11-15 MED ORDER — METOPROLOL TARTRATE 5 MG/5ML IV SOLN
5.0000 mg | Freq: Once | INTRAVENOUS | Status: AC
  Administered 2015-11-15: 5 mg via INTRAVENOUS
  Filled 2015-11-15: qty 5

## 2015-11-15 NOTE — Care Management (Signed)
Transferred out of icu this day.  Transitioned to oral lasix.  Blood pressure control continues

## 2015-11-15 NOTE — Progress Notes (Signed)
Pt BP has remained elevated throughout the night. PRN Hydralazine given around 0000, little change. SBP >160 around 0200. Prime doc notified. 5mg  Lopressor ordered and given. Now, SBP >160 again. Per Dr. Estanislado Pandy, give one time dose 20mg  Hydralazine.

## 2015-11-15 NOTE — Progress Notes (Signed)
[place] [facility]                                                                                                                                                                                            Patient Demographics   Mckenzie Lopez, is a 30 y.o. female, DOB - May 22, 1985, [id number]  Admit date - 11/13/2015   Admitting Physician Quintella Baton, MD  Outpatient Primary MD for the patient is [name], MD   LOS - 2  Subjective:She and shortness of breath is resolved, her blood pressure continues to be elevated. She is denying any chest pains     Review of Systems:   CONSTITUTIONAL: No documented fever. No fatigue, weakness. No weight gain, no weight loss.  EYES: No blurry or double vision.  ENT: No tinnitus. No postnasal drip. No redness of the oropharynx.  RESPIRATORY: No cough, no wheeze, no hemoptysis. No dyspnea.  CARDIOVASCULAR: No chest pain. No orthopnea. No palpitations. No syncope.  GASTROINTESTINAL: No nausea, no vomiting or diarrhea. No abdominal pain. No melena or hematochezia.  GENITOURINARY: No dysuria or hematuria.  ENDOCRINE: No polyuria or nocturia. No heat or cold intolerance.  HEMATOLOGY: No anemia. No bruising. No bleeding.  INTEGUMENTARY: No rashes. No lesions.  MUSCULOSKELETAL: No arthritis. No swelling. No gout.  NEUROLOGIC: No numbness, tingling, or ataxia. No seizure-type activity.  PSYCHIATRIC: No anxiety. No insomnia. No ADD.    Vitals:   Filed Vitals:   11/15/15 0800 11/15/15 0838 11/15/15 0900 11/15/15 1000  BP: 169/124 149/101 156/114 152/109  Pulse: 81 95 96 94  Temp:      TempSrc:      Resp: 30 25 18 22   Height:      Weight:      SpO2: 96% 98% 97% 96%    Wt Readings from Last 3 Encounters:  11/15/15 130.3 kg (287 lb 4.2 oz)  05/28/15 133.358 kg (294 lb)  02/21/15 138.347 kg (305 lb)     Intake/Output Summary (Last 24 hours) at 11/15/15 1038 Last data filed at 11/15/15 1000   Gross per 24 hour  Intake 1522.51 ml  Output   3290 ml  Net -1767.49 ml    Physical Exam:   GENERAL: Pleasant-appearing in no apparent distress.  HEAD, EYES, EARS, NOSE AND THROAT: Atraumatic, normocephalic. Extraocular muscles are intact. Pupils equal and reactive to light. Sclerae anicteric. No conjunctival injection. No oro-pharyngeal erythema.  NECK: Supple. There is no jugular venous distention. No bruits, no lymphadenopathy, no thyromegaly.  HEART: Regular rate and rhythm,. No murmurs, no rubs, no clicks.  LUNGS: Clear to auscultation bilaterally.  No rales or rhonchi. No wheezes.  ABDOMEN: Soft, flat, nontender, nondistended. Has good bowel sounds. No hepatosplenomegaly appreciated.  EXTREMITIES: No evidence of any cyanosis, clubbing, or peripheral edema.  +2 pedal and radial pulses bilaterally.  NEUROLOGIC: The patient is alert, awake, and oriented x3 with no focal motor or sensory deficits appreciated bilaterally.  SKIN: Moist and warm with no rashes appreciated.  Psych: Not anxious, depressed LN: No inguinal LN enlargement    Antibiotics   Anti-infectives    None      Medications   Scheduled Meds: . amLODipine  10 mg Oral Daily  . enoxaparin (LOVENOX) injection  40 mg Subcutaneous Q12H  . folic acid  1 mg Oral Daily  . furosemide  40 mg Oral BID  . hydrALAZINE  100 mg Oral Q8H  . labetalol  300 mg Oral TID  . prenatal multivitamin  1 tablet Oral Q1200  . sodium chloride flush  3 mL Intravenous Q12H   Continuous Infusions: . nitroGLYCERIN Stopped (11/14/15 1345)   PRN Meds:.acetaminophen **OR** acetaminophen, hydrALAZINE, promethazine, senna-docusate   Data Review:   Micro Results Recent Results (from the past 240 hour(s))  Culture, blood (routine x 2)     Status: None (Preliminary result)   Collection Time: 11/13/15  8:55 PM  Result Value Ref Range Status   Specimen Description BLOOD LT HAND  Final   Special Requests BOTTLES DRAWN AEROBIC AND ANAEROBIC  3CC  Final   Culture NO GROWTH 2 DAYS  Final   Report Status PENDING  Incomplete  Culture, blood (routine x 2)     Status: None (Preliminary result)   Collection Time: 11/13/15  8:55 PM  Result Value Ref Range Status   Specimen Description BLOOD RIGHT ASSIST CONTROL  Final   Special Requests   Final    BOTTLES DRAWN AEROBIC AND ANAEROBIC  AERO [place] ANA 20CC   Culture NO GROWTH 2 DAYS  Final   Report Status PENDING  Incomplete  MRSA PCR Screening     Status: None   Collection Time: 11/13/15 11:52 PM  Result Value Ref Range Status   MRSA by PCR NEGATIVE NEGATIVE Final    Comment:        The GeneXpert MRSA Assay (FDA approved for NASAL specimens only), is one component of a comprehensive MRSA colonization surveillance program. It is not intended to diagnose MRSA infection nor to guide or monitor treatment for MRSA infections.     Radiology Reports Ct Angio Chest Pe W Or Wo Contrast  11/15/2015  CLINICAL DATA:  Shortness of breath beginning approximately 6:30 p.m. yesterday evening. Some wheezing and coughing. K per 3 days ago. Morbidly obese with history of hypertension. Pulmonary edema on chest x-ray dated 11/13/2015. EXAM: CT ANGIOGRAPHY CHEST WITH CONTRAST TECHNIQUE: Multidetector CT imaging of the chest was performed using the standard protocol during bolus administration of intravenous contrast. Multiplanar CT image reconstructions and MIPs were obtained to evaluate the vascular anatomy. CONTRAST:  175 cc Isovue 370 COMPARISON:  Chest x-ray dated 11/13/2015. FINDINGS: Mediastinum/Lymph Nodes: Some of the most peripheral segmental and subsegmental pulmonary artery branches are difficult to definitively characterize due to body habitus and mild breathing motion artifact. There is no pulmonary embolism identified within the main, lobar or central segmental pulmonary arteries bilaterally. Thoracic aorta is normal in caliber and configuration. No aneurysm or dissection. Heart size is upper  normal. No pericardial effusion. No mass or enlarged lymph nodes seen within the mediastinum or perihilar regions. Lungs/Pleura: Small bilateral pleural  effusions with adjacent atelectasis. No evidence of active CHF/pulmonary edema on today's exam. Upper abdomen: Limited images of the upper abdomen are unremarkable. Musculoskeletal: Osseous structures appear normal. Superficial soft tissues are unremarkable. Review of the MIP images confirms the above findings. IMPRESSION: 1. No pulmonary embolism seen, with mild study limitations detailed above. 2. Small bilateral pleural effusions with adjacent bibasilar atelectasis. Lungs otherwise clear. No evidence of active CHF/pulmonary edema. Electronically Signed   By: Franki Cabot M.D.   On: 11/15/2015 09:41   US Venous Img Lower Bilateral  11/14/2015  CLINICAL DATA:  Elevated D-dimer.  Three days postpartum. EXAM: BILATERAL LOWER EXTREMITY VENOUS DUPLEX ULTRASOUND TECHNIQUE: Doppler venous assessment of the bilateral lower extremity deep venous system was performed, including characterization of spectral flow, compressibility, and phasicity. COMPARISON:  None. FINDINGS: There is complete compressibility of the bilateral common femoral, femoral, and popliteal veins. Doppler analysis demonstrates respiratory phasicity and augmentation of flow with calf compression. No obvious superficial vein or calf vein thrombosis. IMPRESSION: No evidence of lower extremity DVT. Electronically Signed   By: Marybelle Killings M.D.   On: 11/14/2015 16:46   Dg Chest Port 1 View  11/13/2015  CLINICAL DATA:  Short of breath and wheezing EXAM: PORTABLE CHEST 1 VIEW COMPARISON:  11/13/2015 FINDINGS: Cardiac silhouette is enlarged. Initial exam is lordotic. Diffuse fine airspace disease and linear interstitial markings. No pneumothorax. IMPRESSION: Cardiomegaly and interstitial edema suggest mild congestive heart failure. Electronically Signed   By: Suzy Bouchard M.D.   On: 11/13/2015 20:50      CBC  Recent Labs Lab 11/13/15 2021 11/14/15 0536  WBC 11.1* 11.4*  HGB 12.0 11.9*  HCT 33.6* 33.1*  PLT 505* 539*  MCV 80.9 80.2  MCH 28.9 28.9  MCHC 35.8 36.0  RDW 15.3* 15.6*  LYMPHSABS 1.3  --   MONOABS 0.7  --   EOSABS 0.3  --   BASOSABS 0.1  --     Chemistries   Recent Labs Lab 11/13/15 2021 11/14/15 0536 11/14/15 1249  NA 139 139  --   K 3.5 3.1* 3.1*  CL 108 104  --   CO2 24 25  --   GLUCOSE 83 82  --   BUN 13 13  --   CREATININE 0.79 0.85  --   CALCIUM 8.9 8.4*  --   MG 1.5* 1.6* 2.1  AST 31  --   --   ALT 38  --   --   ALKPHOS 76  --   --   BILITOT 0.6  --   --    ------------------------------------------------------------------------------------------------------------------ estimated creatinine clearance is 129.5 mL/min (by C-G formula based on Cr of 0.85). ------------------------------------------------------------------------------------------------------------------ No results for input(s): HGBA1C in the last 72 hours. ------------------------------------------------------------------------------------------------------------------ No results for input(s): CHOL, HDL, LDLCALC, TRIG, CHOLHDL, LDLDIRECT in the last 72 hours. ------------------------------------------------------------------------------------------------------------------  Recent Labs  11/14/15 0005  TSH 1.925   ------------------------------------------------------------------------------------------------------------------ No results for input(s): VITAMINB12, FOLATE, FERRITIN, TIBC, IRON, RETICCTPCT in the last 72 hours.  Coagulation profile  Recent Labs Lab 11/13/15 2021  INR 1.21    No results for input(s): DDIMER in the last 72 hours.  Cardiac Enzymes  Recent Labs Lab 11/14/15 0005 11/14/15 0536 11/14/15 1100  TROPONINI 0.12* 0.07* 0.04*    ------------------------------------------------------------------------------------------------------------------ Invalid input(s): POCBNP    Assessment & Plan   Patient is a 31 year old status post recent delivery admitted with acute CHF  1. Acute Diastolic CHF, as well as flash pulmonary edema suspect due to accelerated hypertension CHF  is resolved Changed to oral Lasix We'll control her blood pressure   2. Accelerated hypertension blood pressure continues to be elevated, I have's increased her hydralazine as well as change her Coreg to labetalol. She is also started on Norvasc Continue use when necessary IV hydralazine  3 hypomagnesemia replaced  4. Hypokalemia recheck potassium we'll replace as needed  5. Elevated troponin suspect due to demand ischemia as a result of accelerated hypertension  6. Misc: Lovenox for DVT prophylaxis    Code Status Orders        Start     Ordered   11/13/15 2349  Full code   Continuous     11/13/15 2348    Code Status History    Date Active Date Inactive Code Status Order ID Comments User Context   This patient has a current code status but no historical code status.           Consults Cardiology DVT Prophylaxis  Lovenox  Lab Results  Component Value Date   PLT 539* 11/14/2015     Time Spent in minutes   35 minutes Greater than 50% of time spent in care coordination and counseling patient regarding the condition and plan of care.   Dustin Flock M.D on 11/15/2015 at 10:38 AM  Between 7am to 6pm - Pager - [phone]  After 6pm go to [email or web address] - password [email or web address] [facility]   Office  [phone]

## 2015-11-15 NOTE — Progress Notes (Addendum)
SUBJECTIVE: Patient is feeling well this morning without any chest pain/tightness/pressure or shortness of breath. Her D-Dimer is elevated. She denies having any leg pain recently.   Filed Vitals:   11/15/15 0500 11/15/15 0510 11/15/15 0600 11/15/15 0700  BP: 157/105  162/110 160/110  Pulse: 99  105 95  Temp:      TempSrc:      Resp: 31  28 22   Height:      Weight:  287 lb 4.2 oz (130.3 kg)    SpO2: 98%  96% 98%    Intake/Output Summary (Last 24 hours) at 11/15/15 0740 Last data filed at 11/15/15 0700  Gross per 24 hour  Intake 1947.01 ml  Output   2840 ml  Net -892.99 ml    LABS: Basic Metabolic Panel:  Recent Labs  11/13/15 2021 11/14/15 0536 11/14/15 1249  NA 139 139  --   K 3.5 3.1* 3.1*  CL 108 104  --   CO2 24 25  --   GLUCOSE 83 82  --   BUN 13 13  --   CREATININE 0.79 0.85  --   CALCIUM 8.9 8.4*  --   MG 1.5* 1.6* 2.1   Liver Function Tests:  Recent Labs  11/13/15 2021  AST 31  ALT 38  ALKPHOS 76  BILITOT 0.6  PROT 6.5  ALBUMIN 3.3*   No results for input(s): LIPASE, AMYLASE in the last 72 hours. CBC:  Recent Labs  11/13/15 2021 11/14/15 0536  WBC 11.1* 11.4*  NEUTROABS 8.6*  --   HGB 12.0 11.9*  HCT 33.6* 33.1*  MCV 80.9 80.2  PLT 505* 539*   Cardiac Enzymes:  Recent Labs  11/14/15 0005 11/14/15 0536 11/14/15 1100  TROPONINI 0.12* 0.07* 0.04*   BNP: Invalid input(s): POCBNP D-Dimer: No results for input(s): DDIMER in the last 72 hours. Hemoglobin A1C: No results for input(s): HGBA1C in the last 72 hours. Fasting Lipid Panel: No results for input(s): CHOL, HDL, LDLCALC, TRIG, CHOLHDL, LDLDIRECT in the last 72 hours. Thyroid Function Tests:  Recent Labs  11/14/15 0005  TSH 1.925   Anemia Panel: No results for input(s): VITAMINB12, FOLATE, FERRITIN, TIBC, IRON, RETICCTPCT in the last 72 hours.   PHYSICAL EXAM General: Well developed, well nourished, in no acute distress HEENT:  Normocephalic and atramatic Neck:   No JVD.  Lungs: Clear bilaterally to auscultation and percussion. Heart: HRRR . Normal S1 and S2 without gallops or murmurs.  Abdomen: Bowel sounds are positive, abdomen soft and non-tender  Msk:  Back normal, normal gait. Normal strength and tone for age. Extremities: No clubbing, cyanosis or edema.   Neuro: Alert and oriented X 3. Psych:  Good affect, responds appropriately  TELEMETRY: NSR 80's-90's  ASSESSMENT AND PLAN:  -Pulmonary edema with exertional shortness of breath, elevated BNP of 594, interstitial edema suggesting mild congestive heart failure on chest xray, and status post recent childbirth. Patient is receiving Lasix 40 mg IV every 12 hours and hospitalist is managing her electrolyte status. Echocardiogram shows normal EF of [id number], mild diastolic dysfunction, and trace MR. No right ventricular strain.  -Elevated D-Dimer with shortness of breath. Can be elevated with pregnancy but considering her abrupt shortness of breath, will order CTA chest, PE protocol.  -Accelerated hypertension possibly related to an alteration in her medication schedule. Blood pressures in the 180s over 100s on admission now with systolic blood pressure in the 140s on nitroglycerin drip. She has been placed on her oral antihypertensive medications and should  be able to be weaned from the nitroglycerin drip once blood pressure better controlled. Overnight her BP has been elevated. Lisinopril was held yesterday due to borderline low BP. Labetalol has been discontinued. Will add hydralazine orally every eight hours and increase lisinopril. Patient is not breastfeeding.      ---Pt refuses lisinopril because everyone in her family is allergic to it. Will switch to amlodipine, which she has taken in the past.  -Elevated troponins. The patient has had no chest pain or pressure and the troponins are likely related to demand ischemia due to pulmonary edema. Advise full workup as an outpatient once discharged.  Active  Problems:   Morbid obesity ([place])   Hyperlipidemia   Pulmonary edema   HTN (hypertension), malignant    [name], NP 11/15/2015 7:40 AM

## 2015-11-15 NOTE — Progress Notes (Signed)
Report called to Tammy on 2A.  Patient transported via wheelchair, belongings and chart transferred, X2 monitor used for transfer, pt placed on 2A box 10 on arrival and CCMD called.  BP improved to 139/100 with large  cuff on left upper arm prior to transfer

## 2015-11-15 NOTE — Progress Notes (Signed)
Per Dr Stevenson Clinch, order labs. He spoke with Ulysees Barns and Posey Pronto will investigate who should be covering this patient for cardiology, Humphrey Rolls or [facility].  Dr Stevenson Clinch made aware of patient's continued hptn, and of recent episode of tachycardia with peaked T Waves

## 2015-11-15 NOTE — Progress Notes (Signed)
Per Dr. Estanislado Pandy, give 0800 dose of Coreg now. Pt SBP >160 and DBP>100. I also asked if we could draw am labs considering we have been replacing her potassium. No new orders at this time. Daily oral BP meds needs re-assessment.

## 2015-11-15 NOTE — Progress Notes (Signed)
Pt refused to wear CPAP, Pt states that she will call if she changes her mind.

## 2015-11-15 NOTE — Consult Note (Signed)
PULMONARY / CRITICAL CARE MEDICINE   Name: Mckenzie Lopez MRN: [id number] DOB: 27-May-1985    ADMISSION DATE:  11/13/2015 CONSULTATION DATE:  11/14/2015  REFERRING MD:  Dr. Claria Dice  CHIEF COMPLAINT: Shortness of breath  HISTORY OF PRESENT ILLNESS:   This is a 30 yo female with a PMH of morbid obesity, hyperlipidemia, HTN LVH on prior echo with a normal EF, migraines, impaired fasting glucose, and low back pain.  She presented to [facility] on 7/5 with c/o shortness of breath, wheezing, and a slight nonproductive cough that lasted a few hours.  She gave birth vaginally on 11/10/2015 at [facility] with no complications.  During pregnancy pts blood pressure was well controlled with labetalol and no history of preeclampsia. PCCM consulted 7/6 for management of acute respiratory failure secondary to pulmonary edema requiring Bipap.    PAST MEDICAL HISTORY :  She  has a past medical history of Low back pain; Morbid obesity ([place]); Hyperlipidemia; Hypertension; IFG (impaired fasting glucose); and Migraines.  PAST SURGICAL HISTORY: She  has no past surgical history on file.  Allergies  Allergen Reactions  . Chlorthalidone Other (See Comments)    Severe hypokalemia    No current facility-administered medications on file prior to encounter.   Current Outpatient Prescriptions on File Prior to Encounter  Medication Sig  . Cholecalciferol (VITAMIN D) 2000 UNITS CAPS Take 2,000 Units by mouth daily.  . hydrocortisone valerate cream (WESTCORT) 0.2 % Apply 1 application topically as needed (itching).   . labetalol (NORMODYNE) 200 MG tablet Take 200 mg by mouth 2 (two) times daily.    FAMILY HISTORY:  Her indicated that her mother is alive. She indicated that her father is alive.   SOCIAL HISTORY: She  reports that she has never smoked. She has never used smokeless tobacco. She reports that she does not drink alcohol or use illicit drugs.  REVIEW OF SYSTEMS:  Positives in BOLD Gen: Denies  fever, chills, weight change, fatigue, night sweats HEENT: Denies blurred vision, double vision, hearing loss, tinnitus, sinus congestion, rhinorrhea, sore throat, neck stiffness, dysphagia PULM: shortness of breath, cough, sputum production, hemoptysis, wheezing CV: Denies chest pain, edema, orthopnea, paroxysmal nocturnal dyspnea, palpitations GI: Denies abdominal pain, nausea, vomiting, diarrhea, hematochezia, melena, constipation, change in bowel habits GU: Denies dysuria, hematuria, polyuria, oliguria, urethral discharge Endocrine: Denies hot or cold intolerance, polyuria, polyphagia or appetite change Derm: Denies rash, dry skin, scaling or peeling skin change Heme: Denies easy bruising, bleeding, bleeding gums Neuro: Denies headache, numbness, weakness, slurred speech, loss of memory or consciousness   SUBJECTIVE:  Pt resting in bed on room air no acute distress. Tolerated autocpap overnight. Still with some elevated BP issues, cardiology following.   VITAL SIGNS: BP 149/101 mmHg  Pulse 95  Temp(Src) 98.4 F (36.9 C) (Oral)  Resp 25  Ht 5\' 5"  (1.651 m)  Wt 287 lb 4.2 oz (130.3 kg)  BMI 47.80 kg/m2  SpO2 98%  LMP 12/20/2014 (Exact Date)  Breastfeeding? Unknown  HEMODYNAMICS:    VENTILATOR SETTINGS:    INTAKE / OUTPUT: I/O last 3 completed shifts: In: 2110.8 [P.O.:1800; I.V.:260.8; IV Piggyback:50] Out: [id number] [Urine:5715; Drains:2000]  PHYSICAL EXAMINATION: General:  Well developed, well nourished Neuro: alert and oriented, follows commands HEENT:  Supple, no JVD Cardiovascular: s1s2, rrr, no M/R/G Lungs:  Clear throughout, even, non labored Abdomen:  Obese, hypoactive BS x4, non tender, non distended Musculoskeletal: normal tone Skin:  Intact, no rashes or lesions  LABS:  BMET  Recent Labs Lab  11/13/15 2021 11/14/15 0536 11/14/15 1249  NA 139 139  --   K 3.5 3.1* 3.1*  CL 108 104  --   CO2 24 25  --   BUN 13 13  --   CREATININE 0.79 0.85  --    GLUCOSE 83 82  --     Electrolytes  Recent Labs Lab 11/13/15 2021 11/14/15 0536 11/14/15 1249  CALCIUM 8.9 8.4*  --   MG 1.5* 1.6* 2.1    CBC  Recent Labs Lab 11/13/15 2021 11/14/15 0536  WBC 11.1* 11.4*  HGB 12.0 11.9*  HCT 33.6* 33.1*  PLT 505* 539*    Coag's  Recent Labs Lab 11/13/15 2021  APTT 32  INR 1.21    Sepsis Markers No results for input(s): LATICACIDVEN, PROCALCITON, O2SATVEN in the last 168 hours.  ABG No results for input(s): PHART, PCO2ART, PO2ART in the last 168 hours.  Liver Enzymes  Recent Labs Lab 11/13/15 2021  AST 31  ALT 38  ALKPHOS 76  BILITOT 0.6  ALBUMIN 3.3*    Cardiac Enzymes  Recent Labs Lab 11/14/15 0005 11/14/15 0536 11/14/15 1100  TROPONINI 0.12* 0.07* 0.04*    Glucose  Recent Labs Lab 11/13/15 2339  GLUCAP 84    Imaging US Venous Img Lower Bilateral  11/14/2015  CLINICAL DATA:  Elevated D-dimer.  Three days postpartum. EXAM: BILATERAL LOWER EXTREMITY VENOUS DUPLEX ULTRASOUND TECHNIQUE: Doppler venous assessment of the bilateral lower extremity deep venous system was performed, including characterization of spectral flow, compressibility, and phasicity. COMPARISON:  None. FINDINGS: There is complete compressibility of the bilateral common femoral, femoral, and popliteal veins. Doppler analysis demonstrates respiratory phasicity and augmentation of flow with calf compression. No obvious superficial vein or calf vein thrombosis. IMPRESSION: No evidence of lower extremity DVT. Electronically Signed   By: Marybelle Killings M.D.   On: 11/14/2015 16:46     STUDIES:  Echo 7/6>>  CULTURES: Blood cultures x2>> negative   ANTIBIOTICS: None  SIGNIFICANT EVENTS: -7/5 pt admitted to ICU due to acute respiratory failure secondary to pulmonary edema requiring bipap -7/6 PCCM consulted   LINES/TUBES: PIV's  DISCUSSION: 30 yo female who presented to [facility] on 7/5 with c/o shortness of breath, wheezing, and a slight  nonproductive cough that lasted a few hours.  She gave birth vaginally on 11/10/2015 at [facility].  PCCM consulted 7/6 for management of acute respiratory failure secondary to pulmonary edema requiring Bipap.  ASSESSMENT / PLAN:  PULMONARY A: Acute respiratory failure secondary to pulmonary edema - resolving ? OSA P:   - on RA now, no longer requiring Bipap. BP management is paramount for a stable respiratory status. - has risk factors for OSA (snoring, HTN, elevated BMI, inc neck size), this can be worked up as outpatient. For the inpatient setting will place her on autocpap at night  - maintain O2>92% - Wells score for PE = 1, low risk. B/L LE U/S negative for dvts.  - no formal sleep study done.  Pulmonary clinic will call patient to setup appointment in next 3-4 weeks. Patient stated that she might follow up with [facility] for her OSA eval, since her sister follows up them also.  CARDIOVASCULAR A:  Cardiomyopathy/dCHF - EF 123456, Grade 1 diastolic dysfunction Hypertension Hx: Hyperlipidemia Troponin elevation Continue labetalol  PRN Hydralazine Nitroglycerin drip-goal systolic bp [id number] Trend troponin's - most likely demand ischemia Cardiology following - follows with [facility] (Dr. Nehemiah Massed) as an outpatient.   RENAL A:   Hypokalemia Hypomagnesium  P:   Trend BMP Replace electrolytes as indicated Monitor UOP Daily weights  GASTROINTESTINAL A: Postpartum (Vaginal delivery 11/10/2015)  Nausea P:   Obstetrics/gynecology consulted appreciate input PRN Phenergan for nausea Heart healthy diet as tolerated   HEMATOLOGIC A:   Anemia P:  Lovenox for VTE prophylaxis  Monitor for s/sx of bleeding Transfuse for HgB of <7  INFECTIOUS A:  Mild leukocytosis P:   Trend WBC and monitor fever curve  ENDOCRINE A:   Impaired fasting glucose P:   Monitor serum glucose Hyper/Hypoglycemic protocol  NEUROLOGIC A:   No acute issues    FAMILY  - Updates: Updated pt  about plan of care and questions answered    Thank you for consulting [facility], we will signoff at this time.  Please feel free to contact us with any questions at [phone] (please enter 7-digits).  Critical Care Time devoted to patient care services described in this note is  35 Minutes.   Vilinda Boehringer, MD [facility] Pager [phone] (please enter 7-digits) On Call Pager [phone] (please enter 7-digits)  Note: This note was prepared with Dragon dictation along with smaller phrase technology. Any transcriptional errors that result from this process are unintentional.

## 2015-11-16 LAB — BASIC METABOLIC PANEL
Anion gap: 7 (ref 5–15)
BUN: 15 mg/dL (ref 6–20)
CALCIUM: 8.7 mg/dL — AB (ref 8.9–10.3)
CHLORIDE: 106 mmol/L (ref 101–111)
CO2: 25 mmol/L (ref 22–32)
CREATININE: 0.79 mg/dL (ref 0.44–1.00)
GFR calc Af Amer: >60 mL/min (ref >60)
GFR calc non Af Amer: >60 mL/min (ref >60)
Glucose, Bld: 84 mg/dL (ref 65–99)
Potassium: 3.7 mmol/L (ref 3.5–5.1)
SODIUM: 138 mmol/L (ref 135–145)

## 2015-11-16 MED ORDER — AMLODIPINE BESYLATE 10 MG PO TABS: 10.0000 mg | ORAL_TABLET | Freq: Every day | ORAL | Status: DC

## 2015-11-16 MED ORDER — LABETALOL HCL 200 MG PO TABS: 200.0000 mg | ORAL_TABLET | Freq: Three times a day (TID) | ORAL | Status: DC

## 2015-11-16 MED ORDER — FUROSEMIDE 40 MG PO TABS: 20.0000 mg | ORAL_TABLET | Freq: Every day | ORAL | Status: DC

## 2015-11-16 MED ORDER — LISINOPRIL 10 MG PO TABS
10.0000 mg | ORAL_TABLET | Freq: Every day | ORAL | Status: DC
  Filled 2015-11-16: qty 1

## 2015-11-16 MED ORDER — HYDRALAZINE HCL 25 MG PO TABS: 25.0000 mg | ORAL_TABLET | Freq: Three times a day (TID) | ORAL | Status: DC

## 2015-11-16 NOTE — Discharge Summary (Signed)
[place] [facility] [place] NAME: Mckenzie Lopez    MR#:  [id number]  DATE OF BIRTH:  [birth year]  DATE OF ADMISSION:  11/13/2015 ADMITTING PHYSICIAN: Quintella Baton, MD  DATE OF DISCHARGE: 11/16/2015  PRIMARY CARE PHYSICIAN: [name], MD    ADMISSION DIAGNOSIS:  Acute pulmonary edema (HCC) [J81.0] SOB (shortness of breath) [R06.02] Acute congestive heart failure, unspecified congestive heart failure type ([place]) [I50.9]  DISCHARGE DIAGNOSIS:  Active Problems:   Morbid obesity ([place])   Hyperlipidemia   Pulmonary edema   HTN (hypertension), malignant   SECONDARY DIAGNOSIS:   Past Medical History  Diagnosis Date  . Low back pain   . Morbid obesity ([place])   . Hyperlipidemia   . Hypertension   . IFG (impaired fasting glucose)   . Migraines     HOSPITAL COURSE:   1. Acute Diastolic CHF, as well as flash pulmonary edema suspect due to accelerated hypertension CHF is resolved Changed to oral Lasix We'll control her blood pressure   2. Accelerated hypertension blood pressure continues to be elevated,    She was taking Amlodipine and labetalol at home,. Added hydralazine and lasix oral. Better controlled now.   Given prescriptions,a nd advised with physician in next 2 weeks, as her Htn meds may need to be adjusted- deut o rapid changes in her body after delivering a baby.  3 hypomagnesemia replaced  4. Hypokalemia recheck potassium we'll replace as needed  5. Elevated troponin suspect due to demand ischemia as a result of accelerated hypertension  6. Misc: Lovenox for DVT prophylaxis  DISCHARGE CONDITIONS:   Stable.  CONSULTS OBTAINED:  Treatment Team:  Rubie Maid, MD Dionisio David, MD  DRUG ALLERGIES:   Allergies  Allergen Reactions  . Chlorthalidone Other (See Comments)    Severe hypokalemia    DISCHARGE MEDICATIONS:   Current Discharge Medication List    START taking these medications   Details   amLODipine (NORVASC) 10 MG tablet Take 1 tablet (10 mg total) by mouth daily. Qty: 30 tablet, Refills: 0    furosemide (LASIX) 40 MG tablet Take 0.5 tablets (20 mg total) by mouth daily. Qty: 30 tablet, Refills: 0    hydrALAZINE (APRESOLINE) 25 MG tablet Take 1 tablet (25 mg total) by mouth 3 (three) times daily. Qty: 90 tablet, Refills: 0      CONTINUE these medications which have CHANGED   Details  labetalol (NORMODYNE) 200 MG tablet Take 1 tablet (200 mg total) by mouth 3 (three) times daily. Qty: 90 tablet, Refills: 0      CONTINUE these medications which have NOT CHANGED   Details  Cholecalciferol (VITAMIN D) 2000 UNITS CAPS Take 2,000 Units by mouth daily.    folic acid (FOLVITE) 1 MG tablet Take 1 mg by mouth daily. Refills: 11    hydrocortisone valerate cream (WESTCORT) 0.2 % Apply 1 application topically as needed (itching).     Pediatric Multiple Vit-C-FA (FLINSTONES GUMMIES OMEGA-3 DHA) CHEW Chew 1 tablet by mouth daily.         DISCHARGE INSTRUCTIONS:    Follow with PMD in 2 weeks.  If you experience worsening of your admission symptoms, develop shortness of breath, life threatening emergency, suicidal or homicidal thoughts you must seek medical attention immediately by calling 911 or calling your MD immediately  if symptoms less severe.  You Must read complete instructions/literature along with all the possible adverse reactions/side effects for all the Medicines you take and that have  been prescribed to you. Take any new Medicines after you have completely understood and accept all the possible adverse reactions/side effects.   Please note  You were cared for by a hospitalist during your hospital stay. If you have any questions about your discharge medications or the care you received while you were in the hospital after you are discharged, you can call the unit and asked to speak with the hospitalist on call if the hospitalist that took care of you is not  available. Once you are discharged, your primary care physician will handle any further medical issues. Please note that NO REFILLS for any discharge medications will be authorized once you are discharged, as it is imperative that you return to your primary care physician (or establish a relationship with a primary care physician if you do not have one) for your aftercare needs so that they can reassess your need for medications and monitor your lab values.    Today   CHIEF COMPLAINT:   Chief Complaint  Patient presents with  . Shortness of Breath    HISTORY OF PRESENT ILLNESS:  Mckenzie Lopez  is a 30 y.o. female presents with a complaint of shortness of breath that began approximately 6:30 this evening. She reports some wheezing and coughing. The cough is nonproductive. She denies any chest pains. She has never had this before. The patient gave birth 3 days ago at [facility]. She is morbidly obese and has a history of hypertension. She states her blood pressure medications were off cycle while she was in the hospital. She was discharged home yesterday. She came to ER where she was diagnosed with pulmonary edema. While pregnant she had a 2-D echo done which showed normal EF. Patient is not breast-feeding.  VITAL SIGNS:  Blood pressure 121/70, pulse 96, temperature 98.8 F (37.1 C), temperature source Oral, resp. rate 18, height 5\' 5"  (1.651 m), weight 126.962 kg (279 lb 14.4 oz), last menstrual period 12/20/2014, SpO2 93 %, unknown if currently breastfeeding.  I/O:   Intake/Output Summary (Last 24 hours) at 11/16/15 0955 Last data filed at 11/16/15 0951  Gross per 24 hour  Intake    360 ml  Output   1700 ml  Net  -1340 ml    PHYSICAL EXAMINATION:  GENERAL:  30 y.o.-year-old patient lying in the bed with no acute distress.  EYES: Pupils equal, round, reactive to light and accommodation. No scleral icterus. Extraocular muscles intact.  HEENT: Head atraumatic, normocephalic.  Oropharynx and nasopharynx clear.  NECK:  Supple, no jugular venous distention. No thyroid enlargement, no tenderness.  LUNGS: Normal breath sounds bilaterally, no wheezing, rales,rhonchi or crepitation. No use of accessory muscles of respiration.  CARDIOVASCULAR: S1, S2 normal. No murmurs, rubs, or gallops.  ABDOMEN: Soft, non-tender, non-distended. Bowel sounds present. No organomegaly or mass.  EXTREMITIES: No pedal edema, cyanosis, or clubbing.  NEUROLOGIC: Cranial nerves II through XII are intact. Muscle strength 5/5 in all extremities. Sensation intact. Gait not checked.  PSYCHIATRIC: The patient is alert and oriented x 3.  SKIN: No obvious rash, lesion, or ulcer.   DATA REVIEW:   CBC  Recent Labs Lab 11/14/15 0536  WBC 11.4*  HGB 11.9*  HCT 33.1*  PLT 539*    Chemistries   Recent Labs Lab 11/13/15 2021  11/15/15 1134 11/16/15 0514  NA 139  < > 136 138  K 3.5  < > 4.1 3.7  CL 108  < > 104 106  CO2 24  < >  24 25  GLUCOSE 83  < > 81 84  BUN 13  < > 13 15  CREATININE 0.79  < > 0.86 0.79  CALCIUM 8.9  < > 8.7* 8.7*  MG 1.5*  < > 1.9  --   AST 31  --   --   --   ALT 38  --   --   --   ALKPHOS 76  --   --   --   BILITOT 0.6  --   --   --   < > = values in this interval not displayed.  Cardiac Enzymes  Recent Labs Lab 11/14/15 1100  TROPONINI 0.04*    Microbiology Results  Results for orders placed or performed during the hospital encounter of 11/13/15  Culture, blood (routine x 2)     Status: None (Preliminary result)   Collection Time: 11/13/15  8:55 PM  Result Value Ref Range Status   Specimen Description BLOOD LT HAND  Final   Special Requests BOTTLES DRAWN AEROBIC AND ANAEROBIC 3CC  Final   Culture NO GROWTH 3 DAYS  Final   Report Status PENDING  Incomplete  Culture, blood (routine x 2)     Status: None (Preliminary result)   Collection Time: 11/13/15  8:55 PM  Result Value Ref Range Status   Specimen Description BLOOD RIGHT ASSIST CONTROL  Final    Special Requests   Final    BOTTLES DRAWN AEROBIC AND ANAEROBIC  AERO Haines ANA 20CC   Culture NO GROWTH 3 DAYS  Final   Report Status PENDING  Incomplete  MRSA PCR Screening     Status: None   Collection Time: 11/13/15 11:52 PM  Result Value Ref Range Status   MRSA by PCR NEGATIVE NEGATIVE Final    Comment:        The GeneXpert MRSA Assay (FDA approved for NASAL specimens only), is one component of a comprehensive MRSA colonization surveillance program. It is not intended to diagnose MRSA infection nor to guide or monitor treatment for MRSA infections.     RADIOLOGY:  Ct Angio Chest Pe W Or Wo Contrast  11/15/2015  CLINICAL DATA:  Shortness of breath beginning approximately 6:30 p.m. yesterday evening. Some wheezing and coughing. K per 3 days ago. Morbidly obese with history of hypertension. Pulmonary edema on chest x-ray dated 11/13/2015. EXAM: CT ANGIOGRAPHY CHEST WITH CONTRAST TECHNIQUE: Multidetector CT imaging of the chest was performed using the standard protocol during bolus administration of intravenous contrast. Multiplanar CT image reconstructions and MIPs were obtained to evaluate the vascular anatomy. CONTRAST:  175 cc Isovue 370 COMPARISON:  Chest x-ray dated 11/13/2015. FINDINGS: Mediastinum/Lymph Nodes: Some of the most peripheral segmental and subsegmental pulmonary artery branches are difficult to definitively characterize due to body habitus and mild breathing motion artifact. There is no pulmonary embolism identified within the main, lobar or central segmental pulmonary arteries bilaterally. Thoracic aorta is normal in caliber and configuration. No aneurysm or dissection. Heart size is upper normal. No pericardial effusion. No mass or enlarged lymph nodes seen within the mediastinum or perihilar regions. Lungs/Pleura: Small bilateral pleural effusions with adjacent atelectasis. No evidence of active CHF/pulmonary edema on today's exam. Upper abdomen: Limited images of the  upper abdomen are unremarkable. Musculoskeletal: Osseous structures appear normal. Superficial soft tissues are unremarkable. Review of the MIP images confirms the above findings. IMPRESSION: 1. No pulmonary embolism seen, with mild study limitations detailed above. 2. Small bilateral pleural effusions with adjacent bibasilar atelectasis. Lungs otherwise clear.  No evidence of active CHF/pulmonary edema. Electronically Signed   By: Franki Cabot M.D.   On: 11/15/2015 09:41   US Venous Img Lower Bilateral  11/14/2015  CLINICAL DATA:  Elevated D-dimer.  Three days postpartum. EXAM: BILATERAL LOWER EXTREMITY VENOUS DUPLEX ULTRASOUND TECHNIQUE: Doppler venous assessment of the bilateral lower extremity deep venous system was performed, including characterization of spectral flow, compressibility, and phasicity. COMPARISON:  None. FINDINGS: There is complete compressibility of the bilateral common femoral, femoral, and popliteal veins. Doppler analysis demonstrates respiratory phasicity and augmentation of flow with calf compression. No obvious superficial vein or calf vein thrombosis. IMPRESSION: No evidence of lower extremity DVT. Electronically Signed   By: Marybelle Killings M.D.   On: 11/14/2015 16:46    EKG:   Orders placed or performed during the hospital encounter of 11/13/15  . EKG 12-Lead  . EKG 12-Lead      Management plans discussed with the patient, family and they are in agreement.  CODE STATUS:     Code Status Orders        Start     Ordered   11/13/15 2349  Full code   Continuous     11/13/15 2348    Code Status History    Date Active Date Inactive Code Status Order ID Comments User Context   This patient has a current code status but no historical code status.      TOTAL TIME TAKING CARE OF THIS PATIENT: 35 minutes.    Vaughan Basta M.D on 11/16/2015 at 9:55 AM  Between 7am to 6pm - Pager - [phone]  After 6pm go to [email or web address] - password [email or web address] [facility]  Office  [phone]  CC: Primary care physician; [name], MD   Note: This dictation was prepared with Dragon dictation along with smaller phrase technology. Any transcriptional errors that result from this process are unintentional.

## 2015-11-16 NOTE — Progress Notes (Signed)
SUBJECTIVE: Mckenzie Lopez is feeling very well this morning and her blood pressure has been much better controlled since yesterday. She is looking forward to going home to her new baby.   Filed Vitals:   11/15/15 2336 11/16/15 0347 11/16/15 0638 11/16/15 0917  BP: 132/82 143/82 148/95 121/70  Pulse: 92 82  96  Temp:  98.8 F (37.1 C)    TempSrc:  Oral    Resp:  18    Height:      Weight:  279 lb 14.4 oz (126.962 kg)    SpO2:  93%      Intake/Output Summary (Last 24 hours) at 11/16/15 1116 Last data filed at 11/16/15 0951  Gross per 24 hour  Intake    360 ml  Output   1150 ml  Net   -790 ml    LABS: Basic Metabolic Panel:  Recent Labs  11/14/15 1249 11/15/15 1134 11/16/15 0514  NA  --  136 138  K 3.1* 4.1 3.7  CL  --  104 106  CO2  --  24 25  GLUCOSE  --  81 84  BUN  --  13 15  CREATININE  --  0.86 0.79  CALCIUM  --  8.7* 8.7*  MG 2.1 1.9  --   PHOS  --  3.6  --    Liver Function Tests:  Recent Labs  11/13/15 2021  AST 31  ALT 38  ALKPHOS 76  BILITOT 0.6  PROT 6.5  ALBUMIN 3.3*   No results for input(s): LIPASE, AMYLASE in the last 72 hours. CBC:  Recent Labs  11/13/15 2021 11/14/15 0536  WBC 11.1* 11.4*  NEUTROABS 8.6*  --   HGB 12.0 11.9*  HCT 33.6* 33.1*  MCV 80.9 80.2  PLT 505* 539*   Cardiac Enzymes:  Recent Labs  11/14/15 0005 11/14/15 0536 11/14/15 1100  TROPONINI 0.12* 0.07* 0.04*   BNP: Invalid input(s): POCBNP D-Dimer: No results for input(s): DDIMER in the last 72 hours. Hemoglobin A1C: No results for input(s): HGBA1C in the last 72 hours. Fasting Lipid Panel: No results for input(s): CHOL, HDL, LDLCALC, TRIG, CHOLHDL, LDLDIRECT in the last 72 hours. Thyroid Function Tests:  Recent Labs  11/14/15 0005  TSH 1.925   Anemia Panel: No results for input(s): VITAMINB12, FOLATE, FERRITIN, TIBC, IRON, RETICCTPCT in the last 72 hours.   PHYSICAL EXAM General: Well developed, well nourished, in no acute  distress HEENT:  Normocephalic and atramatic Neck:  No JVD.  Lungs: Clear bilaterally to auscultation and percussion. Heart: HRRR . Normal S1 and S2 without gallops or murmurs.  Abdomen: Bowel sounds are positive, abdomen soft and non-tender  Msk:  Back normal, normal gait. Normal strength and tone for age. Extremities: No clubbing, cyanosis or edema.   Neuro: Alert and oriented X 3. Psych:  Good affect, responds appropriately  TELEMETRY: Normal sinus rhythm  ASSESSMENT AND PLAN: The patient was admitted with exertional shortness of breath, pulmonary edema, elevated BNP of 594 and chest x-ray suggesting mild congestive heart failure. She is status post childbirth. Echocardiogram showed a normal EF of 123456, mild diastolic dysfunction and trace MR. There is no right ventricular strain. D-dimer was elevated but CTA was negative for PE and lower extremity Dopplers were negative for DVT. Accelerated hypertension is possibly related to alteration in her medication schedule during hospitalization for childbirth. She was back on her oral medication regimen her blood pressures have come into except and ranges.  The patient has expressed that she  was told she may have sleep apnea and would like to come to our office for sleep study. Information has been given for her to contact us to set up the sleep study. The patient is stable for discharge.  Active Problems:   Morbid obesity ([place])   Hyperlipidemia   Pulmonary edema   HTN (hypertension), malignant    [name], NP 11/16/2015 11:16 AM

## 2015-11-16 NOTE — Progress Notes (Signed)
Discharge instructions given. IV's and tele removed. Prescriptions given for new cardiac medications along with education. Patient also educated on hypertension and heart failure. Questions answered and patient will make follow up with PCP. Aunt is here for transportation.

## 2015-11-18 LAB — CULTURE, BLOOD (ROUTINE X 2)
Culture: NO GROWTH
Culture: NO GROWTH

## 2015-11-19 ENCOUNTER — Telehealth: Payer: Self-pay

## 2015-11-19 NOTE — Telephone Encounter (Signed)
Staff message forwarded to Anderson Malta to schedule hosp f/u  Nothing further needed.

## 2015-11-19 NOTE — Telephone Encounter (Signed)
Spoke with patient scheduled 3-4 week hfu with Dr. Juanell Fairly on 12/12/15.  Patient stated she is already scheduled to see [facility] provider at [facility].

## 2015-11-29 ENCOUNTER — Ambulatory Visit (INDEPENDENT_AMBULATORY_CARE_PROVIDER_SITE_OTHER): Payer: 59 | Admitting: Family Medicine

## 2015-11-29 ENCOUNTER — Encounter: Payer: Self-pay | Admitting: Family Medicine

## 2015-11-29 VITALS — BP 120/80 | HR 95 | Temp 98.5°F | Resp 14 | Wt 266.3 lb

## 2015-11-29 DIAGNOSIS — D649 Anemia, unspecified: Secondary | ICD-10-CM | POA: Diagnosis not present

## 2015-11-29 DIAGNOSIS — J810 Acute pulmonary edema: Secondary | ICD-10-CM

## 2015-11-29 DIAGNOSIS — D582 Other hemoglobinopathies: Secondary | ICD-10-CM | POA: Diagnosis not present

## 2015-11-29 DIAGNOSIS — E876 Hypokalemia: Secondary | ICD-10-CM

## 2015-11-29 DIAGNOSIS — I10 Essential (primary) hypertension: Secondary | ICD-10-CM | POA: Diagnosis not present

## 2015-11-29 DIAGNOSIS — R634 Abnormal weight loss: Secondary | ICD-10-CM | POA: Diagnosis not present

## 2015-11-29 HISTORY — DX: Other hemoglobinopathies: D58.2

## 2015-11-29 LAB — BASIC METABOLIC PANEL WITH GFR
BUN: 13 mg/dL (ref 7–25)
CHLORIDE: 105 mmol/L (ref 98–110)
CO2: 25 mmol/L (ref 20–31)
Calcium: 9.4 mg/dL (ref 8.6–10.2)
Creat: 0.96 mg/dL (ref 0.50–1.10)
GFR, Est African American: >89 mL/min (ref >=60)
GFR, Est Non African American: 79 mL/min (ref >=60)
Glucose, Bld: 85 mg/dL (ref 65–99)
POTASSIUM: 3.7 mmol/L (ref 3.5–5.3)
SODIUM: 142 mmol/L (ref 135–146)

## 2015-11-29 LAB — CBC WITH DIFFERENTIAL/PLATELET
BASOS PCT: 2 %
Basophils Absolute: 88 cells/uL (ref 0–200)
EOS ABS: 132 {cells}/uL (ref 15–500)
Eosinophils Relative: 3 %
HCT: 38 % (ref 35.0–45.0)
HEMOGLOBIN: 13.4 g/dL (ref 11.7–15.5)
LYMPHS ABS: 1188 {cells}/uL (ref 850–3900)
Lymphocytes Relative: 27 %
MCH: 28.5 pg (ref 27.0–33.0)
MCHC: 35.3 g/dL (ref 32.0–36.0)
MCV: 80.7 fL (ref 80.0–100.0)
MONO ABS: 264 {cells}/uL (ref 200–950)
MONOS PCT: 6 %
MPV: 8.4 fL (ref 7.5–12.5)
NEUTROS ABS: 2728 {cells}/uL (ref 1500–7800)
Neutrophils Relative %: 62 %
PLATELETS: 584 10*3/uL — AB (ref 140–400)
RBC: 4.71 MIL/uL (ref 3.80–5.10)
RDW: 15.6 % — ABNORMAL HIGH (ref 11.0–15.0)
WBC: 4.4 10*3/uL (ref 3.8–10.8)

## 2015-11-29 LAB — TSH: TSH: 0.7 m[IU]/L

## 2015-11-29 NOTE — Assessment & Plan Note (Signed)
Stop the furosemide; pt will monitor BP at home and as pressures continue to come down, we'll stop the hydralazine

## 2015-11-29 NOTE — Assessment & Plan Note (Signed)
Noted in the hospital labs; she has been on furosemide now; no evidence of fluid overload; last LVEF normal; will stop the furosemide, check K+; I'd rather her not be on diuretic which could cause hypokalemia if not needed

## 2015-11-29 NOTE — Assessment & Plan Note (Signed)
I asked her to call her [place] doctor back, because I think she should stay on aspirin 81 mg daily even though it was not in her most recent hospital discharge summary; she will start back and call them

## 2015-11-29 NOTE — Assessment & Plan Note (Signed)
Patient has lost nearly 30 pounds over last 6 months; heart rate near 100 even on labetalol; will check TSH

## 2015-11-29 NOTE — Assessment & Plan Note (Signed)
Resolved; reviewed LVEF and it was 65% on echo in hospital

## 2015-11-29 NOTE — Patient Instructions (Addendum)
Check your blood pressure at home As it comes down, we'll have you stop the hydralazine Your goal blood pressure is less than 140 mmHg on top and less than 90 mmHg on the bottom Try to follow the DASH guidelines (DASH stands for Dietary Approaches to Stop Hypertension) Try to limit the sodium in your diet.  Ideally, consume less than 1.5 grams (less than 1,500mg ) per day. Do not add salt when cooking or at the table.  Check the sodium amount on labels when shopping, and choose items lower in sodium when given a choice. Avoid or limit foods that already contain a lot of sodium. Eat a diet rich in fruits and vegetables and whole grains.  We'll get labs today  DASH Eating Plan DASH stands for "Dietary Approaches to Stop Hypertension." The DASH eating plan is a healthy eating plan that has been shown to reduce high blood pressure (hypertension). Additional health benefits may include reducing the risk of type 2 diabetes mellitus, heart disease, and stroke. The DASH eating plan may also help with weight loss. WHAT DO I NEED TO KNOW ABOUT THE DASH EATING PLAN? For the DASH eating plan, you will follow these general guidelines:  Choose foods with a percent daily value for sodium of less than 5% (as listed on the food label).  Use salt-free seasonings or herbs instead of table salt or sea salt.  Check with your health care provider or pharmacist before using salt substitutes.  Eat lower-sodium products, often labeled as "lower sodium" or "no salt added."  Eat fresh foods.  Eat more vegetables, fruits, and low-fat dairy products.  Choose whole grains. Look for the word "whole" as the first word in the ingredient list.  Choose fish and skinless chicken or [place] more often than red meat. Limit fish, poultry, and meat to 6 oz (170 g) each day.  Limit sweets, desserts, sugars, and sugary drinks.  Choose heart-healthy fats.  Limit cheese to 1 oz (28 g) per day.  Eat more home-cooked food and  less restaurant, buffet, and fast food.  Limit fried foods.  Cook foods using methods other than frying.  Limit canned vegetables. If you do use them, rinse them well to decrease the sodium.  When eating at a restaurant, ask that your food be prepared with less salt, or no salt if possible. WHAT FOODS CAN I EAT? Seek help from a dietitian for individual calorie needs. Grains Whole grain or whole wheat bread. Brown rice. Whole grain or whole wheat pasta. Quinoa, bulgur, and whole grain cereals. Low-sodium cereals. Corn or whole wheat flour tortillas. Whole grain cornbread. Whole grain crackers. Low-sodium crackers. Vegetables Fresh or frozen vegetables (raw, steamed, roasted, or grilled). Low-sodium or reduced-sodium tomato and vegetable juices. Low-sodium or reduced-sodium tomato sauce and paste. Low-sodium or reduced-sodium canned vegetables.  Fruits All fresh, canned (in natural juice), or frozen fruits. Meat and Other Protein Products Ground beef (85% or leaner), grass-fed beef, or beef trimmed of fat. Skinless chicken or [place]. Ground chicken or [place]. Pork trimmed of fat. All fish and seafood. Eggs. Dried beans, peas, or lentils. Unsalted nuts and seeds. Unsalted canned beans. Dairy Low-fat dairy products, such as skim or 1% milk, 2% or reduced-fat cheeses, low-fat ricotta or cottage cheese, or plain low-fat yogurt. Low-sodium or reduced-sodium cheeses. Fats and Oils Tub margarines without trans fats. Light or reduced-fat mayonnaise and salad dressings (reduced sodium). Avocado. Safflower, olive, or canola oils. Natural peanut or almond butter. Other Unsalted popcorn and pretzels. The  items listed above may not be a complete list of recommended foods or beverages. Contact your dietitian for more options. WHAT FOODS ARE NOT RECOMMENDED? Grains White bread. White pasta. White rice. Refined cornbread. Bagels and croissants. Crackers that contain trans fat. Vegetables Creamed or  fried vegetables. Vegetables in a cheese sauce. Regular canned vegetables. Regular canned tomato sauce and paste. Regular tomato and vegetable juices. Fruits Dried fruits. Canned fruit in light or heavy syrup. Fruit juice. Meat and Other Protein Products Fatty cuts of meat. Ribs, chicken wings, bacon, sausage, bologna, salami, chitterlings, fatback, hot dogs, bratwurst, and packaged luncheon meats. Salted nuts and seeds. Canned beans with salt. Dairy Whole or 2% milk, cream, half-and-half, and cream cheese. Whole-fat or sweetened yogurt. Full-fat cheeses or blue cheese. Nondairy creamers and whipped toppings. Processed cheese, cheese spreads, or cheese curds. Condiments Onion and garlic salt, seasoned salt, table salt, and sea salt. Canned and packaged gravies. Worcestershire sauce. Tartar sauce. Barbecue sauce. Teriyaki sauce. Soy sauce, including reduced sodium. Steak sauce. Fish sauce. Oyster sauce. Cocktail sauce. Horseradish. Ketchup and mustard. Meat flavorings and tenderizers. Bouillon cubes. Hot sauce. Tabasco sauce. Marinades. Taco seasonings. Relishes. Fats and Oils Butter, stick margarine, lard, shortening, ghee, and bacon fat. Coconut, palm kernel, or palm oils. Regular salad dressings. Other Pickles and olives. Salted popcorn and pretzels. The items listed above may not be a complete list of foods and beverages to avoid. Contact your dietitian for more information. WHERE CAN I FIND MORE INFORMATION? National Heart, Lung, and Blood Institute: travelstabloid.com   This information is not intended to replace advice given to you by your health care provider. Make sure you discuss any questions you have with your health care provider.   Document Released: 04/16/2011 Document Revised: 05/18/2014 Document Reviewed: 03/01/2013 Elsevier Interactive Patient Education [employer].

## 2015-11-29 NOTE — Progress Notes (Signed)
BP 120/80 mmHg  Pulse 95  Temp(Src) 98.5 F (36.9 C) (Oral)  Resp 14  Wt 266 lb 4.8 oz (120.793 kg)  SpO2 97%  LMP 12/20/2014 (Exact Date)   Subjective:    Patient ID: Mckenzie Lopez, female    DOB: 1985/06/05, 30 y.o.   MRN: [id number]  HPI: Mckenzie Lopez is a 30 y.o. female  Chief Complaint  Patient presents with  . Follow-up    blood pressure   Patient has checking BP at home 121/82 yesterday, 127/78 on July 17th, 112/78 on July 15th When first leaving the hospital, it was 128/85 on July 8th, then 147/92 on Sunday July 9th (that was the highest, just busy and had a cook-out at home, up a lot), 117/85 on Monday, 146/89 on Wednesday and was busy that day  No SHOB, no chest pain No leg swelling No skipped heartbeats, no cramps in the calves She has been getting bananas, eating those daily  She was taking an iron pill during her last month of pregnancy They also had her taking aspirin daily during her pregnancy; I looked at the [facility] records and found that she was diagnosed with hemoglobin C trait and that's why; she was not on aspirin according to the discharge summary from [place]  Relevant past medical, surgical, family and social history reviewed Past Medical History  Diagnosis Date  . Low back pain   . Morbid obesity ([place])   . Hyperlipidemia   . Hypertension   . IFG (impaired fasting glucose)   . Migraines   . Hemoglobin C trait ([place]) 11/29/2015   History reviewed. No pertinent past surgical history.   Family History  Problem Relation Age of Onset  . Diabetes Mother   . Hypertension Mother   . Cancer Maternal Grandmother     breast  . Diabetes Maternal Grandfather   . Hypertension Maternal Grandfather   . Seizures Paternal Grandfather   . Cancer Maternal Aunt     breast  . Heart disease Neg Hx   . Stroke Neg Hx   . COPD Neg Hx    Social History  Substance Use Topics  . Smoking status: Never Smoker   . Smokeless tobacco: Never Used    . Alcohol Use: No   Interim medical history since last visit reviewed. Allergies and medications reviewed  Review of Systems Per HPI unless specifically indicated above      Objective:    BP 120/80 mmHg  Pulse 95  Temp(Src) 98.5 F (36.9 C) (Oral)  Resp 14  Wt 266 lb 4.8 oz (120.793 kg)  SpO2 97%  LMP 12/20/2014 (Exact Date)  Wt Readings from Last 3 Encounters:  11/29/15 266 lb 4.8 oz (120.793 kg)  11/16/15 279 lb 14.4 oz (126.962 kg)  05/28/15 294 lb (133.358 kg)   body mass index is 44.31 kg/(m^2).  Physical Exam  Constitutional: She appears well-developed and well-nourished. No distress.  Weight loss of 28 pounds since January (last 6 months)  HENT:  Head: Normocephalic and atraumatic.  Eyes: EOM are normal. No scleral icterus.  Neck: No thyromegaly present.  Cardiovascular: Normal rate, regular rhythm and normal heart sounds.   No murmur heard. Pulmonary/Chest: Effort normal and breath sounds normal. No respiratory distress. She has no wheezes. She has no rales.  Abdominal: Soft. She exhibits no distension.  Musculoskeletal: Normal range of motion. She exhibits no edema.  Neurological: She is alert. She exhibits normal muscle tone.  Skin: Skin is warm and dry.  She is not diaphoretic. No pallor.  Psychiatric: She has a normal mood and affect. Her behavior is normal. Judgment and thought content normal.   Results for orders placed or performed during the hospital encounter of 11/13/15  Culture, blood (routine x 2)  Result Value Ref Range   Specimen Description BLOOD LT HAND    Special Requests BOTTLES DRAWN AEROBIC AND ANAEROBIC 3CC    Culture NO GROWTH 5 DAYS    Report Status 11/18/2015 FINAL   Culture, blood (routine x 2)  Result Value Ref Range   Specimen Description BLOOD RIGHT ASSIST CONTROL    Special Requests      BOTTLES DRAWN AEROBIC AND ANAEROBIC  AERO [place] ANA 20CC   Culture NO GROWTH 5 DAYS    Report Status 11/18/2015 FINAL   MRSA PCR Screening   Result Value Ref Range   MRSA by PCR NEGATIVE NEGATIVE  Troponin I  Result Value Ref Range   Troponin I 0.06 (HH) <0.03 ng/mL  Brain natriuretic peptide  Result Value Ref Range   B Natriuretic Peptide 594.0 (H) 0.0 - 100.0 pg/mL  CBC with Differential  Result Value Ref Range   WBC 11.1 (H) 3.6 - 11.0 K/uL   RBC 4.16 3.80 - 5.20 MIL/uL   Hemoglobin 12.0 12.0 - 16.0 g/dL   HCT 33.6 (L) 35.0 - 47.0 %   MCV 80.9 80.0 - 100.0 fL   MCH 28.9 26.0 - 34.0 pg   MCHC 35.8 32.0 - 36.0 g/dL   RDW 15.3 (H) 11.5 - 14.5 %   Platelets 505 (H) 150 - 440 K/uL   Neutrophils Relative % 78 %   Neutro Abs 8.6 (H) 1.4 - 6.5 K/uL   Lymphocytes Relative 12 %   Lymphs Abs 1.3 1.0 - 3.6 K/uL   Monocytes Relative 6 %   Monocytes Absolute 0.7 0.2 - 0.9 K/uL   Eosinophils Relative 3 %   Eosinophils Absolute 0.3 0 - 0.7 K/uL   Basophils Relative 1 %   Basophils Absolute 0.1 0 - 0.1 K/uL  Comprehensive metabolic panel  Result Value Ref Range   Sodium 139 135 - 145 mmol/L   Potassium 3.5 3.5 - 5.1 mmol/L   Chloride 108 101 - 111 mmol/L   CO2 24 22 - 32 mmol/L   Glucose, Bld 83 65 - 99 mg/dL   BUN 13 6 - 20 mg/dL   Creatinine, Ser 0.79 0.44 - 1.00 mg/dL   Calcium 8.9 8.9 - 10.3 mg/dL   Total Protein 6.5 6.5 - 8.1 g/dL   Albumin 3.3 (L) 3.5 - 5.0 g/dL   AST 31 15 - 41 U/L   ALT 38 14 - 54 U/L   Alkaline Phosphatase 76 38 - 126 U/L   Total Bilirubin 0.6 0.3 - 1.2 mg/dL   GFR calc non Af Amer >60 >60 mL/min   GFR calc Af Amer >60 >60 mL/min   Anion gap 7 5 - 15  Protime-INR  Result Value Ref Range   Prothrombin Time 15.5 (H) 11.4 - 15.0 seconds   INR 1.21   APTT  Result Value Ref Range   aPTT 32 24 - 36 seconds  Magnesium  Result Value Ref Range   Magnesium 1.5 (L) 1.7 - 2.4 mg/dL  Protein / creatinine ratio, urine  Result Value Ref Range   Creatinine, Urine 51 mg/dL   Total Protein, Urine 17 mg/dL   Protein Creatinine Ratio 0.33 (H) 0.00 - 0.15 mg/mg[Cre]  Urinalysis complete, with  microscopic  Result Value Ref Range   Color, Urine YELLOW (A) YELLOW   APPearance HAZY (A) CLEAR   Glucose, UA NEGATIVE NEGATIVE mg/dL   Bilirubin Urine NEGATIVE NEGATIVE   Ketones, ur NEGATIVE NEGATIVE mg/dL   Specific Gravity, Urine 1.009 1.005 - 1.030   Hgb urine dipstick 3+ (A) NEGATIVE   pH 6.0 5.0 - 8.0   Protein, ur NEGATIVE NEGATIVE mg/dL   Nitrite POSITIVE (A) NEGATIVE   Leukocytes, UA 3+ (A) NEGATIVE   RBC / HPF TOO NUMEROUS TO COUNT 0 - 5 RBC/hpf   WBC, UA TOO NUMEROUS TO COUNT 0 - 5 WBC/hpf   Bacteria, UA RARE (A) NONE SEEN   Squamous Epithelial / LPF 0-5 (A) NONE SEEN  Urinalysis complete, with microscopic  Result Value Ref Range   Color, Urine COLORLESS (A) YELLOW   APPearance CLEAR (A) CLEAR   Glucose, UA NEGATIVE NEGATIVE mg/dL   Bilirubin Urine NEGATIVE NEGATIVE   Ketones, ur NEGATIVE NEGATIVE mg/dL   Specific Gravity, Urine 1.004 (L) 1.005 - 1.030   Hgb urine dipstick 2+ (A) NEGATIVE   pH 6.0 5.0 - 8.0   Protein, ur NEGATIVE NEGATIVE mg/dL   Nitrite NEGATIVE NEGATIVE   Leukocytes, UA NEGATIVE NEGATIVE   RBC / HPF TOO NUMEROUS TO COUNT 0 - 5 RBC/hpf   WBC, UA 0-5 0 - 5 WBC/hpf   Bacteria, UA NONE SEEN NONE SEEN   Squamous Epithelial / LPF NONE SEEN NONE SEEN  Protein / creatinine ratio, urine  Result Value Ref Range   Creatinine, Urine <10 mg/dL   Total Protein, Urine <6 mg/dL   Protein Creatinine Ratio        0.00 - 0.15 mg/mg[Cre]  Glucose, capillary  Result Value Ref Range   Glucose-Capillary 84 65 - 99 mg/dL  Basic metabolic panel  Result Value Ref Range   Sodium 139 135 - 145 mmol/L   Potassium 3.1 (L) 3.5 - 5.1 mmol/L   Chloride 104 101 - 111 mmol/L   CO2 25 22 - 32 mmol/L   Glucose, Bld 82 65 - 99 mg/dL   BUN 13 6 - 20 mg/dL   Creatinine, Ser 0.85 0.44 - 1.00 mg/dL   Calcium 8.4 (L) 8.9 - 10.3 mg/dL   GFR calc non Af Amer >60 >60 mL/min   GFR calc Af Amer >60 >60 mL/min   Anion gap 10 5 - 15  CBC  Result Value Ref Range   WBC 11.4 (H)  3.6 - 11.0 K/uL   RBC 4.12 3.80 - 5.20 MIL/uL   Hemoglobin 11.9 (L) 12.0 - 16.0 g/dL   HCT 33.1 (L) 35.0 - 47.0 %   MCV 80.2 80.0 - 100.0 fL   MCH 28.9 26.0 - 34.0 pg   MCHC 36.0 32.0 - 36.0 g/dL   RDW 15.6 (H) 11.5 - 14.5 %   Platelets 539 (H) 150 - 440 K/uL  Troponin I  Result Value Ref Range   Troponin I 0.07 (HH) <0.03 ng/mL  Troponin I  Result Value Ref Range   Troponin I 0.04 (HH) <0.03 ng/mL  Troponin I  Result Value Ref Range   Troponin I 0.12 (HH) <0.03 ng/mL  TSH  Result Value Ref Range   TSH 1.925 0.350 - 4.500 uIU/mL  Magnesium  Result Value Ref Range   Magnesium 1.6 (L) 1.7 - 2.4 mg/dL  Fibrin derivatives D-Dimer ([facility] only)  Result Value Ref Range   Fibrin derivatives D-dimer ([facility]) 1299 (H) 0 - 499  Potassium  Result  Value Ref Range   Potassium 3.1 (L) 3.5 - 5.1 mmol/L  Magnesium  Result Value Ref Range   Magnesium 2.1 1.7 - 2.4 mg/dL  Basic metabolic panel  Result Value Ref Range   Sodium 136 135 - 145 mmol/L   Potassium 4.1 3.5 - 5.1 mmol/L   Chloride 104 101 - 111 mmol/L   CO2 24 22 - 32 mmol/L   Glucose, Bld 81 65 - 99 mg/dL   BUN 13 6 - 20 mg/dL   Creatinine, Ser 0.86 0.44 - 1.00 mg/dL   Calcium 8.7 (L) 8.9 - 10.3 mg/dL   GFR calc non Af Amer >60 >60 mL/min   GFR calc Af Amer >60 >60 mL/min   Anion gap 8 5 - 15  Magnesium  Result Value Ref Range   Magnesium 1.9 1.7 - 2.4 mg/dL  Phosphorus  Result Value Ref Range   Phosphorus 3.6 2.5 - 4.6 mg/dL  Basic metabolic panel  Result Value Ref Range   Sodium 138 135 - 145 mmol/L   Potassium 3.7 3.5 - 5.1 mmol/L   Chloride 106 101 - 111 mmol/L   CO2 25 22 - 32 mmol/L   Glucose, Bld 84 65 - 99 mg/dL   BUN 15 6 - 20 mg/dL   Creatinine, Ser 0.79 0.44 - 1.00 mg/dL   Calcium 8.7 (L) 8.9 - 10.3 mg/dL   GFR calc non Af Amer >60 >60 mL/min   GFR calc Af Amer >60 >60 mL/min   Anion gap 7 5 - 15  ECHO COMPLETE  Result Value Ref Range   Weight 4652.59 oz   Height 65 in   BP 162/95 mmHg   LV PW d  11.4 (A) 0.6 - 1.1 mm   FS 36 28 - 44 %   LA ID, A-P, ES 45 mm   IVS/LV PW RATIO, ED .96    LV e' LATERAL 8.59 cm/s   LV E/e' medial 12.34    LV E/e'average 12.34    AV pk vel 150 cm/s   AV Area VTI 2.22 cm2   LA diam index 1.94 cm/m2   LA vol A4C 74.8 ml   E decel time 211 msec   LVOT diameter 20 mm   LVOT area 3.14 cm2   LVOT peak vel 106 cm/s   Ao pk vel .71 m/s   Peak grad 9 mmHg   Peak grad 4 mmHg   E/e' ratio 12.34    MV pk E vel 106 m/s   MV pk A vel 96.5 m/s   AV peak Index .50    MV Dec 211    LA diam end sys 45 mm   TDI e' medial 4.68    TDI e' lateral 8.59    TAPSE 11.7 mm      Assessment & Plan:   Problem List Items Addressed This Visit      Cardiovascular and Mediastinum   Hypertension - Primary    Stop the furosemide; pt will monitor BP at home and as pressures continue to come down, we'll stop the hydralazine      Relevant Medications   aspirin EC 81 MG tablet   Other Relevant Orders   BASIC METABOLIC PANEL WITH GFR     Respiratory   Pulmonary edema    Resolved; reviewed LVEF and it was 65% on echo in hospital        Other   Morbid obesity ([place])    Patient has lost nearly 30 pounds  over last 6 months; heart rate near 100 even on labetalol; will check TSH      Hypokalemia    Noted in the hospital labs; she has been on furosemide now; no evidence of fluid overload; last LVEF normal; will stop the furosemide, check K+; I'd rather her not be on diuretic which could cause hypokalemia if not needed      Hemoglobin C trait ([place])    I asked her to call her [place] doctor back, because I think she should stay on aspirin 81 mg daily even though it was not in her most recent hospital discharge summary; she will start back and call them      Anemia   Relevant Orders   CBC with Differential/Platelet    Other Visit Diagnoses    Weight loss        Relevant Orders    TSH       Follow up plan: Return in about 4 weeks (around 12/27/2015) for  hypertension.  An after-visit summary was printed and given to the patient at [place].  Please see the patient instructions which may contain other information and recommendations beyond what is mentioned above in the assessment and plan.  Meds ordered this encounter  Medications  . aspirin EC 81 MG tablet    Sig: Take 1 tablet (81 mg total) by mouth daily.    Orders Placed This Encounter  Procedures  . CBC with Differential/Platelet  . BASIC METABOLIC PANEL WITH GFR  . TSH

## 2015-12-02 ENCOUNTER — Other Ambulatory Visit: Payer: Self-pay

## 2015-12-02 ENCOUNTER — Telehealth: Payer: Self-pay

## 2015-12-02 DIAGNOSIS — D473 Essential (hemorrhagic) thrombocythemia: Secondary | ICD-10-CM

## 2015-12-02 DIAGNOSIS — D75839 Thrombocytosis, unspecified: Secondary | ICD-10-CM

## 2015-12-02 NOTE — Telephone Encounter (Signed)
Pt needs refills on labetalol, hydralazine and amlodopine?

## 2015-12-06 MED ORDER — HYDRALAZINE HCL 25 MG PO TABS: 25.0000 mg | ORAL_TABLET | Freq: Three times a day (TID) | ORAL | 0 refills | Status: DC

## 2015-12-06 MED ORDER — AMLODIPINE BESYLATE 10 MG PO TABS: 10.0000 mg | ORAL_TABLET | Freq: Every day | ORAL | 0 refills | Status: DC

## 2015-12-06 MED ORDER — LABETALOL HCL 200 MG PO TABS: 200.0000 mg | ORAL_TABLET | Freq: Three times a day (TID) | ORAL | 0 refills | Status: DC

## 2015-12-10 NOTE — Progress Notes (Signed)
* [facility]     Assessment and Plan:  Dyspnea -Due to pulmonary edema/volume overload post delivery. This now appears to have resolved.  Morbid obesity. -Likely contributing to dyspnea, discussed importance of weight loss.  Pulmonary edema. -Complicating pregnancy. Now appears resolved, lungs are clear to auscultation.  Excessive daytime sleepiness. -Symptoms and signs of obstructive sleep apnea. -Patient is planning to follow up with [facility] where her family gets her medical care, she is asked to call us back should she require follow-up for sleep study here, we also discussed the possibility of doing a home sleep study which may be easier for her.  Date: 12/10/2015  MRN# [id number] Clarabel Tambi Odaniel February 04, 1986   Nisreen Amoni Cumings is a 30 y.o. old female seen in follow up for chief complaint of  Chief Complaint  Patient presents with  . Hospitalization Follow-up    breathing is doing well since hosp. no new conerns today. plans on seeing [facility] for osa     HPI:   This is a 30 year old female who was resently discharged from the hospital on 11/16/15 after a 3 day admission for pulmonary edema complicating recent delivery.   She is morbidly obese and has a history of hypertension. She follows up now to be evaluated for dyspnea and possible sleep apnea. She is already scheduled to see OSA provider at [facility].  Review of CT chest images from 11/15/14 shows severe obesity with reduced lung volumes, small bilateral pleural effusions with mild compression atelectasis bilaterally. No abnormality otherwise seen in the lungs. Her baby is now 53 month old. Pt notes that she is feeling well, her breathing is back to normal. She does not feel that she gets short winded, she has never been diagnosed with asthma.   She takes no inhalers at this time. Her mother snores that she snores at night, she remains sleepy during the day because of her  baby who wakes up about every 3 hours.   Medication:   Outpatient Encounter Prescriptions as of 12/12/2015  Medication Sig  . amLODipine (NORVASC) 10 MG tablet Take 1 tablet (10 mg total) by mouth daily.  Marland Kitchen aspirin EC 81 MG tablet Take 1 tablet (81 mg total) by mouth daily.  . Cholecalciferol (VITAMIN D) 2000 UNITS CAPS Take 2,000 Units by mouth daily.  . folic acid (FOLVITE) 1 MG tablet Take 1 mg by mouth daily.  . hydrALAZINE (APRESOLINE) 25 MG tablet Take 1 tablet (25 mg total) by mouth 3 (three) times daily.  . hydrocortisone valerate cream (WESTCORT) 0.2 % Apply 1 application topically as needed (itching).   . labetalol (NORMODYNE) 200 MG tablet Take 1 tablet (200 mg total) by mouth 3 (three) times daily.  . Pediatric Multiple Vit-C-FA (FLINSTONES GUMMIES OMEGA-3 DHA) CHEW Chew 1 tablet by mouth daily.   No facility-administered encounter medications on file as of 12/12/2015.      Allergies:  Chlorthalidone  Review of Systems: Gen:  Denies  fever, sweats. HEENT: Denies blurred vision. Cvc:  No dizziness, chest pain or heaviness Resp:   Denies cough or sputum porduction. Gi: Denies swallowing difficulty, stomach pain.  Gu:  Denies bladder incontinence, burning urine Ext:   No Joint pain, stiffness. Skin: No skin rash, easy bruising. Endoc:  No polyuria, polydipsia. Psych: No depression, insomnia. Other:  All other systems were reviewed and found to be negative other than what is mentioned in the HPI.   Physical Examination:   VS: BP 124/72 (BP  Location: Left Wrist, Cuff Size: Normal)   Pulse 84   Ht 5\' 5"  (1.651 m)   Wt 267 lb 9.6 oz (121.4 kg)   LMP 12/20/2014 (Exact Date)   SpO2 96%   BMI 44.53 kg/m   General Appearance: No distress  Neuro:without focal findings,  speech normal,  HEENT: PERRLA, EOM intact. Pulmonary: normal breath sounds, No wheezing.   CardiovascularNormal S1,S2.  No m/r/g.   Abdomen: Benign, Soft, non-tender. Renal:  No costovertebral tenderness    GU:  Not performed at this time. Endoc: No evident thyromegaly, no signs of acromegaly. Skin:   warm, no rash. Extremities: normal, no cyanosis, clubbing.   LABORATORY PANEL:   CBC No results for input(s): WBC, HGB, HCT, PLT in the last 168 hours. ------------------------------------------------------------------------------------------------------------------  Chemistries  No results for input(s): NA, K, CL, CO2, GLUCOSE, BUN, CREATININE, CALCIUM, MG, AST, ALT, ALKPHOS, BILITOT in the last 168 hours.  Invalid input(s): GFRCGP ------------------------------------------------------------------------------------------------------------------  Cardiac Enzymes No results for input(s): TROPONINI in the last 168 hours. ------------------------------------------------------------  RADIOLOGY:   No results found for this or any previous visit. No results found for this or any previous visit. ------------------------------------------------------------------------------------------------------------------  Thank  you for allowing [facility] to assist in the care of your patient. Our recommendations are noted above.  Please contact us if we can be of further service.   Marda Stalker, MD.  [facility] Office Number: [phone]  Patricia Pesa, M.D.  Vilinda Boehringer, M.D.  Merton Border, M.D  12/10/2015

## 2015-12-12 ENCOUNTER — Ambulatory Visit (INDEPENDENT_AMBULATORY_CARE_PROVIDER_SITE_OTHER): Payer: 59 | Admitting: Internal Medicine

## 2015-12-12 ENCOUNTER — Encounter: Payer: Self-pay | Admitting: Internal Medicine

## 2015-12-12 VITALS — BP 124/72 | HR 84 | Ht 65.0 in | Wt 267.6 lb

## 2015-12-12 DIAGNOSIS — J810 Acute pulmonary edema: Secondary | ICD-10-CM

## 2015-12-12 NOTE — Patient Instructions (Signed)
--  Follow up with [facility] for sleep study, you could do a home sleep study which may be easier for you.   --Call us back if you have any trouble with your breathing.

## 2015-12-16 ENCOUNTER — Other Ambulatory Visit: Payer: Self-pay | Admitting: Family Medicine

## 2015-12-19 ENCOUNTER — Telehealth: Payer: Self-pay | Admitting: Family Medicine

## 2015-12-19 MED ORDER — AMLODIPINE BESYLATE 5 MG PO TABS: 5.0000 mg | ORAL_TABLET | Freq: Every day | ORAL | 0 refills | Status: DC

## 2015-12-19 NOTE — Telephone Encounter (Signed)
Have her decrease her amlodipine from 10 mg daily to 5 mg daily; she can break the 10 mg in half or cut it, and I sent a new Rx for the 5 mg strength to mail order Check BP and if trending up, call If swelling continues, then stop amlodipine completely, monitor BP and call AVOID SALT

## 2015-12-20 ENCOUNTER — Telehealth: Payer: Self-pay | Admitting: Family Medicine

## 2015-12-20 MED ORDER — HYDRALAZINE HCL 50 MG PO TABS: 50.0000 mg | ORAL_TABLET | Freq: Three times a day (TID) | ORAL | 0 refills | Status: DC

## 2015-12-20 NOTE — Telephone Encounter (Signed)
I spoke with patient; we'll have OB-GYN to fill out the paperwork; they kept her out to return on 01/20/16; she is as fine as she is concerned with baby Her BP was 130/90 yesterday at the OB-GYN I explained that we should be able to get her BP controlled within a month, so I don't think she needs work excuse extended The note that her OB wrote her should be sufficient; I don't plan on extending her out of work beyond 01/20/16 Taking 25 mg hydralazine TID Increase to 50 mg TID Take amlodipine 5 mg daily Continue labetalol as before She is maintaining her weight; I would like her to be working on weight loss, walking every day in the mall too Encouraged her to do all the good things she needs to do for her BP

## 2015-12-31 ENCOUNTER — Encounter: Payer: Self-pay | Admitting: Family Medicine

## 2015-12-31 ENCOUNTER — Ambulatory Visit (INDEPENDENT_AMBULATORY_CARE_PROVIDER_SITE_OTHER): Payer: 59 | Admitting: Family Medicine

## 2015-12-31 DIAGNOSIS — I10 Essential (primary) hypertension: Secondary | ICD-10-CM | POA: Diagnosis not present

## 2015-12-31 MED ORDER — LABETALOL HCL 200 MG PO TABS: 400.0000 mg | ORAL_TABLET | Freq: Two times a day (BID) | ORAL | 0 refills | Status: DC

## 2015-12-31 NOTE — Patient Instructions (Addendum)
Change the labetalol to 400 mg at 10 am and 10 pm Continue the other medicines Follow the DASH guidelines Return to see me Sept 4th

## 2015-12-31 NOTE — Progress Notes (Signed)
BP (!) 138/96   Pulse 89   Temp 98.8 F (37.1 C) (Oral)   Resp 14   Wt 265 lb 8 oz (120.4 kg)   SpO2 96%   BMI 44.18 kg/m    Subjective:    Patient ID: Mckenzie Lopez, female    DOB: [birth year], 30 y.o.   MRN: [id number]  HPI: Mckenzie Lopez is a 30 y.o. female  Chief Complaint  Patient presents with  . Follow-up    4 weeks BP   She is here for BP follow-up; checking BP at home every other day; top numbers range 125-138 and bottom number from 82-85, highest was 90 She is staying away from salt; no black licorice; no decongestants; nonsmoker; no alcohol She says the swelling has gone away since coming down from 10 mg to 5 mg of amlodipine Following DASH guidelines Her highest reading is in the morning, better throughout the day I asked if she has sleep apnea and she says they set that up for her; she is going to go to [facility] right across the street; she just needs to find the time She went to the OB-GYN follow-up; she was doing fine and they cleared her to go back to work in September in terms of her health for the baby and delivery; they want her BP to be taken care of before she goes back to work  Depression screen [facility] 2/9 12/31/2015  Decreased Interest 0  Down, Depressed, Hopeless 0  PHQ - 2 Score 0   Relevant past medical, surgical, family and social history reviewed Past Medical History:  Diagnosis Date  . 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 Hyperlipidemia   . Hypertension   . IFG (impaired fasting glucose)   . Low back pain   . Migraines   . Morbid obesity ([place])    No past surgical history on file.  MD note: delivery  Family History  Problem Relation Age of Onset  . Diabetes Mother   . Hypertension Mother   . Cancer Maternal Grandmother     breast  . Diabetes Maternal Grandfather   . Hypertension Maternal Grandfather   . Seizures Paternal Grandfather   . Cancer Maternal Aunt     breast  . Heart disease Neg Hx   . Stroke Neg Hx   .  COPD Neg Hx    Social History  Substance Use Topics  . Smoking status: Never Smoker  . Smokeless tobacco: Never Used  . Alcohol use No   Interim medical history since last visit reviewed. Allergies and medications reviewed  Review of Systems Per HPI unless specifically indicated above     Objective:    BP (!) 138/96   Pulse 89   Temp 98.8 F (37.1 C) (Oral)   Resp 14   Wt 265 lb 8 oz (120.4 kg)   SpO2 96%   BMI 44.18 kg/m   Wt Readings from Last 3 Encounters:  12/31/15 265 lb 8 oz (120.4 kg)  12/12/15 267 lb 9.6 oz (121.4 kg)  11/29/15 266 lb 4.8 oz (120.8 kg)    Physical Exam  Constitutional: She appears well-developed and well-nourished. No distress.  Weight loss of 2 pounds in just under 3 weeks; morbidly obese  Eyes: EOM are normal. No scleral icterus.  Cardiovascular: Normal rate, regular rhythm and normal heart sounds.   Pulmonary/Chest: Effort normal and breath sounds normal. She has no wheezes. She has no rales.  Musculoskeletal: She exhibits no edema.  Neurological: She is alert.  Skin: Skin is warm and dry. She is not diaphoretic. No pallor.  Psychiatric: She has a normal mood and affect. Her behavior is normal. Judgment and thought content normal.   Results for orders placed or performed in visit on 11/29/15  CBC with Differential/Platelet  Result Value Ref Range   WBC 4.4 3.8 - 10.8 K/uL   RBC 4.71 3.80 - 5.10 MIL/uL   Hemoglobin 13.4 11.7 - 15.5 g/dL   HCT 38.0 35.0 - 45.0 %   MCV 80.7 80.0 - 100.0 fL   MCH 28.5 27.0 - 33.0 pg   MCHC 35.3 32.0 - 36.0 g/dL   RDW 15.6 (H) 11.0 - 15.0 %   Platelets 584 (H) 140 - 400 K/uL   MPV 8.4 7.5 - 12.5 fL   Neutro Abs 2,728 1,500 - 7,800 cells/uL   Lymphs Abs 1,188 850 - 3,900 cells/uL   Monocytes Absolute 264 200 - 950 cells/uL   Eosinophils Absolute 132 15 - 500 cells/uL   Basophils Absolute 88 0 - 200 cells/uL   Neutrophils Relative % 62 %   Lymphocytes Relative 27 %   Monocytes Relative 6 %    Eosinophils Relative 3 %   Basophils Relative 2 %   Smear Review Criteria for review not met   BASIC METABOLIC PANEL WITH GFR  Result Value Ref Range   Sodium 142 135 - 146 mmol/L   Potassium 3.7 3.5 - 5.3 mmol/L   Chloride 105 98 - 110 mmol/L   CO2 25 20 - 31 mmol/L   Glucose, Bld 85 65 - 99 mg/dL   BUN 13 7 - 25 mg/dL   Creat 0.96 0.50 - 1.10 mg/dL   Calcium 9.4 8.6 - 10.2 mg/dL   GFR, Est African American >89 >=60 mL/min   GFR, Est Non African American 79 >=60 mL/min  TSH  Result Value Ref Range   TSH 0.70 mIU/L      Assessment & Plan:   Problem List Items Addressed This Visit      Cardiovascular and Mediastinum   Hypertension    Will simplify her beta-blocker regimen, increasing daily dose from 600 mg daily to 800 mg daily, so she'll take 400 mg every 12 hours; continue DASH guidelines; continue to work on weight loss; I anticipate her BP being controlled so that she can return to work at the date previously set by her OB-GYN; I do not expect that date will have to be extended      Relevant Medications   labetalol (NORMODYNE) 200 MG tablet     Other   Morbid obesity ([place])    Encouraged patient to keep working on weight loss, healthy eating       Other Visit Diagnoses   None.      Follow up plan: Return in about 2 weeks (around 01/13/2016) for hypertension.  An after-visit summary was printed and given to the patient at [facility].  Please see the patient instructions which may contain other information and recommendations beyond what is mentioned above in the assessment and plan.  Meds ordered this encounter  Medications  . labetalol (NORMODYNE) 200 MG tablet    Sig: Take 2 tablets (400 mg total) by mouth every 12 (twelve) hours.    Dispense:  120 tablet    Refill:  0    New instructions, going up from 600 mg daily to 800 mg daily

## 2016-01-02 NOTE — Assessment & Plan Note (Signed)
Will simplify her beta-blocker regimen, increasing daily dose from 600 mg daily to 800 mg daily, so she'll take 400 mg every 12 hours; continue DASH guidelines; continue to work on weight loss; I anticipate her BP being controlled so that she can return to work at the date previously set by her OB-GYN; I do not expect that date will have to be extended

## 2016-01-02 NOTE — Assessment & Plan Note (Signed)
Encouraged patient to keep working on weight loss, healthy eating

## 2016-01-14 ENCOUNTER — Telehealth: Payer: Self-pay | Admitting: Internal Medicine

## 2016-01-14 ENCOUNTER — Encounter: Payer: Self-pay | Admitting: Family Medicine

## 2016-01-14 ENCOUNTER — Ambulatory Visit (INDEPENDENT_AMBULATORY_CARE_PROVIDER_SITE_OTHER): Payer: 59 | Admitting: Family Medicine

## 2016-01-14 DIAGNOSIS — I10 Essential (primary) hypertension: Secondary | ICD-10-CM | POA: Diagnosis not present

## 2016-01-14 DIAGNOSIS — G4719 Other hypersomnia: Secondary | ICD-10-CM

## 2016-01-14 NOTE — Telephone Encounter (Signed)
Pt states she is ready to schedule her sleep study. Please call.

## 2016-01-14 NOTE — Telephone Encounter (Signed)
Pt has LM that she is ready to do her sleep study. Due to pt's insurance she will have to do in lab sleep study. Would you like for me to place that order? Thanks.

## 2016-01-14 NOTE — Assessment & Plan Note (Signed)
Talked with patient about her weight; discussed portions, types of foods, timing of eating, if she's drinking enough water, etc; I believe that if she would lose weight, she might be able to decrease some of her antihypertensive

## 2016-01-14 NOTE — Patient Instructions (Addendum)
Please do contact Dr. Ashby Dawes to get a sleep study evaluation as soon as you can  Check out the information at [email or web address].[email or web address] entitled "Nutrition for Weight Loss: What You Need to Know about Fad Diets" Try to lose 1 pound per week by taking in fewer calories and burning off more calories You can succeed by limiting portions, limiting foods dense in calories and fat, becoming more active, and drinking 8 glasses of water a day (64 ounces) Don't skip meals, especially breakfast, as skipping meals may alter your metabolism Do not use over-the-counter weight loss pills or gimmicks that claim rapid weight loss A healthy BMI (or body mass index) is between 18.5 and 24.9 You can calculate your ideal BMI at the [place] website ClubMonetize.fr  Use something like myfitnesspal or something similar to track calories Try to 6000 steps a day  Keep trying the DASH guidelines  DASH Eating Plan DASH stands for "Dietary Approaches to Stop Hypertension." The DASH eating plan is a healthy eating plan that has been shown to reduce high blood pressure (hypertension). Additional health benefits may include reducing the risk of type 2 diabetes mellitus, heart disease, and stroke. The DASH eating plan may also help with weight loss. WHAT DO I NEED TO KNOW ABOUT THE DASH EATING PLAN? For the DASH eating plan, you will follow these general guidelines:  Choose foods with a percent daily value for sodium of less than 5% (as listed on the food label).  Use salt-free seasonings or herbs instead of table salt or sea salt.  Check with your health care provider or pharmacist before using salt substitutes.  Eat lower-sodium products, often labeled as "lower sodium" or "no salt added."  Eat fresh foods.  Eat more vegetables, fruits, and low-fat dairy products.  Choose whole grains. Look for the word "whole" as the first word in the ingredient list.  Choose fish  and skinless chicken or [place] more often than red meat. Limit fish, poultry, and meat to 6 oz (170 g) each day.  Limit sweets, desserts, sugars, and sugary drinks.  Choose heart-healthy fats.  Limit cheese to 1 oz (28 g) per day.  Eat more home-cooked food and less restaurant, buffet, and fast food.  Limit fried foods.  Cook foods using methods other than frying.  Limit canned vegetables. If you do use them, rinse them well to decrease the sodium.  When eating at a restaurant, ask that your food be prepared with less salt, or no salt if possible. WHAT FOODS CAN I EAT? Seek help from a dietitian for individual calorie needs. Grains Whole grain or whole wheat bread. Brown rice. Whole grain or whole wheat pasta. Quinoa, bulgur, and whole grain cereals. Low-sodium cereals. Corn or whole wheat flour tortillas. Whole grain cornbread. Whole grain crackers. Low-sodium crackers. Vegetables Fresh or frozen vegetables (raw, steamed, roasted, or grilled). Low-sodium or reduced-sodium tomato and vegetable juices. Low-sodium or reduced-sodium tomato sauce and paste. Low-sodium or reduced-sodium canned vegetables.  Fruits All fresh, canned (in natural juice), or frozen fruits. Meat and Other Protein Products Ground beef (85% or leaner), grass-fed beef, or beef trimmed of fat. Skinless chicken or [place]. Ground chicken or [place]. Pork trimmed of fat. All fish and seafood. Eggs. Dried beans, peas, or lentils. Unsalted nuts and seeds. Unsalted canned beans. Dairy Low-fat dairy products, such as skim or 1% milk, 2% or reduced-fat cheeses, low-fat ricotta or cottage cheese, or plain low-fat yogurt. Low-sodium or reduced-sodium cheeses. Fats and Oils Tub margarines without trans fats.  Light or reduced-fat mayonnaise and salad dressings (reduced sodium). Avocado. Safflower, olive, or canola oils. Natural peanut or almond butter. Other Unsalted popcorn and pretzels. The items listed above may not be a  complete list of recommended foods or beverages. Contact your dietitian for more options. WHAT FOODS ARE NOT RECOMMENDED? Grains White bread. White pasta. White rice. Refined cornbread. Bagels and croissants. Crackers that contain trans fat. Vegetables Creamed or fried vegetables. Vegetables in a cheese sauce. Regular canned vegetables. Regular canned tomato sauce and paste. Regular tomato and vegetable juices. Fruits Dried fruits. Canned fruit in light or heavy syrup. Fruit juice. Meat and Other Protein Products Fatty cuts of meat. Ribs, chicken wings, bacon, sausage, bologna, salami, chitterlings, fatback, hot dogs, bratwurst, and packaged luncheon meats. Salted nuts and seeds. Canned beans with salt. Dairy Whole or 2% milk, cream, half-and-half, and cream cheese. Whole-fat or sweetened yogurt. Full-fat cheeses or blue cheese. Nondairy creamers and whipped toppings. Processed cheese, cheese spreads, or cheese curds. Condiments Onion and garlic salt, seasoned salt, table salt, and sea salt. Canned and packaged gravies. Worcestershire sauce. Tartar sauce. Barbecue sauce. Teriyaki sauce. Soy sauce, including reduced sodium. Steak sauce. Fish sauce. Oyster sauce. Cocktail sauce. Horseradish. Ketchup and mustard. Meat flavorings and tenderizers. Bouillon cubes. Hot sauce. Tabasco sauce. Marinades. Taco seasonings. Relishes. Fats and Oils Butter, stick margarine, lard, shortening, ghee, and bacon fat. Coconut, palm kernel, or palm oils. Regular salad dressings. Other Pickles and olives. Salted popcorn and pretzels. The items listed above may not be a complete list of foods and beverages to avoid. Contact your dietitian for more information. WHERE CAN I FIND MORE INFORMATION? National Heart, Lung, and Blood Institute: travelstabloid.com   This information is not intended to replace advice given to you by your health care provider. Make sure you discuss any questions  you have with your health care provider.   Document Released: 04/16/2011 Document Revised: 05/18/2014 Document Reviewed: 03/01/2013 Elsevier Interactive Patient Education [employer].

## 2016-01-14 NOTE — Progress Notes (Signed)
BP 138/82   Pulse 82   Temp 99.1 F (37.3 C)   Wt 268 lb (121.6 kg)   SpO2 98%   Breastfeeding? No   BMI 44.60 kg/m    Subjective:    Patient ID: Mckenzie Lopez, female    DOB: 09/11/1985, 30 y.o.   MRN: [id number]  HPI: Mckenzie Lopez is a 30 y.o. female  Chief Complaint  Patient presents with  . Hypertension   She is here for f/u of hypertension She still has high readings in the morning sometimes, 89-90 in the mornings; then checks later and it's 80-81 She is going to get the sleep study done soon; she says she had called for it; the office was closed when she called; she is going to go to another doctor for the sleep study She is staying away from salt; cut out fried foods; limits sandwich meats She continues to struggle with her weight; she goes to the gym and works out and can lose real good and then hits a plateau and then gets stuck; she has been walking at the mall, 3 x a week, one hour at a time, with stroller Does not eat late at night; good about watching portions; she cuts her off from seconds She could drink more water, drinks some, but not enough  Depression screen [facility] 2/9 01/14/2016 12/31/2015  Decreased Interest 0 0  Down, Depressed, Hopeless 0 0  PHQ - 2 Score 0 0   Relevant past medical, surgical, family and social history reviewed Past Medical History:  Diagnosis Date  . 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 Hyperlipidemia   . Hypertension   . IFG (impaired fasting glucose)   . Low back pain   . Migraines   . Morbid obesity ([place])    No past surgical history on file.   Family History  Problem Relation Age of Onset  . Diabetes Mother   . Hypertension Mother   . Cancer Maternal Grandmother     breast  . Diabetes Maternal Grandfather   . Hypertension Maternal Grandfather   . Seizures Paternal Grandfather   . Cancer Maternal Aunt     breast  . Heart disease Neg Hx   . Stroke Neg Hx   . COPD Neg Hx    Social History  Substance  Use Topics  . Smoking status: Never Smoker  . Smokeless tobacco: Never Used  . Alcohol use No   Interim medical history since last visit reviewed. Allergies and medications reviewed  Review of Systems Per HPI unless specifically indicated above     Objective:    BP 138/82   Pulse 82   Temp 99.1 F (37.3 C)   Wt 268 lb (121.6 kg)   SpO2 98%   Breastfeeding? No   BMI 44.60 kg/m   Wt Readings from Last 3 Encounters:  01/14/16 268 lb (121.6 kg)  12/31/15 265 lb 8 oz (120.4 kg)  12/12/15 267 lb 9.6 oz (121.4 kg)    Physical Exam  Constitutional: She appears well-developed and well-nourished. No distress.  Weight gain of 2+ pounds in 2 weeks; morbidly obese  Cardiovascular: Normal rate, regular rhythm and normal heart sounds.   Pulmonary/Chest: Effort normal and breath sounds normal. She has no rales.  Musculoskeletal: She exhibits no edema.  Neurological: She is alert.  Skin: Skin is warm and dry. She is not diaphoretic. No pallor.  Psychiatric: She has a normal mood and affect. Her behavior is normal. Judgment and  thought content normal.      Assessment & Plan:   Problem List Items Addressed This Visit      Cardiovascular and Mediastinum   Hypertension    Will continue her current medicines; I am hopeful that she will work on weight loss and then we may be able to start to decrease some of her medicine; avoid salt, try to follow DASH guidelines, continue to walk; see AVS; I also urged her to get with her pulmonologist ASAP to have a sleep study considered/ordered; unrecognized/untreated sleep apnea can exacerbate hypertension; she agrees, and she has the number and says she will call him        Other   Morbid obesity ([place])    Talked with patient about her weight; discussed portions, types of foods, timing of eating, if she's drinking enough water, etc; I believe that if she would lose weight, she might be able to decrease some of her antihypertensive       Other  Visit Diagnoses   None.     Follow up plan: Return in about 2 months (around 03/15/2016).  An after-visit summary was printed and given to the patient at [place].  Please see the patient instructions which may contain other information and recommendations beyond what is mentioned above in the assessment and plan.  Face-to-face time with patient was more than 15 minutes, >50% time spent counseling and coordination of care

## 2016-01-14 NOTE — Assessment & Plan Note (Addendum)
Will continue her current medicines; I am hopeful that she will work on weight loss and then we may be able to start to decrease some of her medicine; avoid salt, try to follow DASH guidelines, continue to walk; see AVS; I also urged her to get with her pulmonologist ASAP to have a sleep study considered/ordered; unrecognized/untreated sleep apnea can exacerbate hypertension; she agrees, and she has the number and says she will call him

## 2016-01-15 NOTE — Telephone Encounter (Signed)
Order placed for sleep study. Nothing further needed.

## 2016-01-15 NOTE — Telephone Encounter (Signed)
Yes, may place order for sleep study.

## 2016-02-10 ENCOUNTER — Other Ambulatory Visit: Payer: Self-pay | Admitting: Family Medicine

## 2016-02-26 ENCOUNTER — Other Ambulatory Visit: Payer: Self-pay | Admitting: Family Medicine

## 2016-02-26 NOTE — Telephone Encounter (Signed)
rx approved

## 2016-03-16 ENCOUNTER — Encounter: Payer: Self-pay | Admitting: Family Medicine

## 2016-03-16 ENCOUNTER — Ambulatory Visit (INDEPENDENT_AMBULATORY_CARE_PROVIDER_SITE_OTHER): Payer: 59 | Admitting: Family Medicine

## 2016-03-16 DIAGNOSIS — R7301 Impaired fasting glucose: Secondary | ICD-10-CM | POA: Diagnosis not present

## 2016-03-16 DIAGNOSIS — I10 Essential (primary) hypertension: Secondary | ICD-10-CM

## 2016-03-16 DIAGNOSIS — D75839 Thrombocytosis, unspecified: Secondary | ICD-10-CM | POA: Insufficient documentation

## 2016-03-16 DIAGNOSIS — D473 Essential (hemorrhagic) thrombocythemia: Secondary | ICD-10-CM

## 2016-03-16 LAB — CBC WITH DIFFERENTIAL/PLATELET
BASOS ABS: 0 {cells}/uL (ref 0–200)
BASOS PCT: 0 %
EOS ABS: 108 {cells}/uL (ref 15–500)
Eosinophils Relative: 2 %
HCT: 34.5 % — ABNORMAL LOW (ref 35.0–45.0)
HEMOGLOBIN: 12 g/dL (ref 11.7–15.5)
LYMPHS ABS: 1566 {cells}/uL (ref 850–3900)
Lymphocytes Relative: 29 %
MCH: 28.8 pg (ref 27.0–33.0)
MCHC: 34.8 g/dL (ref 32.0–36.0)
MCV: 82.7 fL (ref 80.0–100.0)
MONOS PCT: 9 %
MPV: 8.6 fL (ref 7.5–12.5)
Monocytes Absolute: 486 cells/uL (ref 200–950)
NEUTROS ABS: 3240 {cells}/uL (ref 1500–7800)
Neutrophils Relative %: 60 %
PLATELETS: 429 10*3/uL — AB (ref 140–400)
RBC: 4.17 MIL/uL (ref 3.80–5.10)
RDW: 13.5 % (ref 11.0–15.0)
WBC: 5.4 10*3/uL (ref 3.8–10.8)

## 2016-03-16 MED ORDER — AMLODIPINE BESYLATE 5 MG PO TABS: 5.0000 mg | ORAL_TABLET | Freq: Every day | ORAL | 3 refills | Status: DC

## 2016-03-16 NOTE — Assessment & Plan Note (Signed)
Continue current regimen; pt to keep working on weight loss, DASH guidelines; refilled the CCB; pt to contact me before next appt if systolic over [id number]

## 2016-03-16 NOTE — Assessment & Plan Note (Signed)
Encouragement given; see AVS 

## 2016-03-16 NOTE — Assessment & Plan Note (Signed)
Last A1c was actually in normal range; will recheck in March

## 2016-03-16 NOTE — Assessment & Plan Note (Signed)
Check platelet count today; explained may be due to inflammation; obesity is a pro-inflammatory condition

## 2016-03-16 NOTE — Patient Instructions (Signed)
Keep up the great job with weight loss Your goal blood pressure is less than 140 mmHg on top. Try to follow the DASH guidelines (DASH stands for Dietary Approaches to Stop Hypertension) Try to limit the sodium in your diet.  Ideally, consume less than 1.5 grams (less than 1,500mg ) per day. Do not add salt when cooking or at the table.  Check the sodium amount on labels when shopping, and choose items lower in sodium when given a choice. Avoid or limit foods that already contain a lot of sodium. Eat a diet rich in fruits and vegetables and whole grains. Check out the information at familydoctor.org entitled "Nutrition for Weight Loss: What You Need to Know about Fad Diets" Try to lose between 1-2 pounds per week by taking in fewer calories and burning off more calories You can succeed by limiting portions, limiting foods dense in calories and fat, becoming more active, and drinking 8 glasses of water a day (64 ounces) Don't skip meals, especially breakfast, as skipping meals may alter your metabolism Do not use over-the-counter weight loss pills or gimmicks that claim rapid weight loss A healthy BMI (or body mass index) is between 18.5 and 24.9 You can calculate your ideal BMI at the [place] website ClubMonet[employer]fr

## 2016-03-16 NOTE — Progress Notes (Signed)
BP 140/82   Pulse 75   Temp 98.1 F (36.7 C) (Oral)   Resp 14   Wt 265 lb (120.2 kg)   LMP 02/20/2016   SpO2 98%   BMI 44.10 kg/m    Subjective:    Patient ID: Mckenzie Lopez, female    DOB: March 21, 1986, 30 y.o.   MRN: [id number]  HPI: Mckenzie Lopez is a 30 y.o. female  Chief Complaint  Patient presents with  . Follow-up   She is here for f/u of HTN Checking BP at home; usually in the 130s over 06T No low systolics Taking medicines as directed, has reminder on the phone Not adding salt to food No added decongestants Does not eat black licorice She can tell she is losing inches; she can feel it in her clothes; weight on the scale might not be much, but losing inches and gaining some muscle No leg swelling Recent labs reviewed Last TSH test in July was normal Totally normal BMP in July Platelet count 584 in July Last A1c normal  Depression screen [facility] 2/9 03/16/2016 01/14/2016 12/31/2015  Decreased Interest 0 0 0  Down, Depressed, Hopeless 0 0 0  PHQ - 2 Score 0 0 0   Relevant past medical, surgical, family and social history reviewed Past Medical History:  Diagnosis Date  . 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 Hyperlipidemia   . Hypertension   . IFG (impaired fasting glucose)   . Low back pain   . Migraines   . Morbid obesity ([place])    History reviewed. No pertinent surgical history. Family History  Problem Relation Age of Onset  . Diabetes Mother   . Hypertension Mother   . Cancer Maternal Grandmother     breast  . Diabetes Maternal Grandfather   . Hypertension Maternal Grandfather   . Seizures Paternal Grandfather   . Cancer Maternal Aunt     breast  . Heart disease Neg Hx   . Stroke Neg Hx   . COPD Neg Hx    Social History  Substance Use Topics  . Smoking status: Never Smoker  . Smokeless tobacco: Never Used  . Alcohol use No   Interim medical history since last visit reviewed. Allergies and medications reviewed  Review of  Systems Per HPI unless specifically indicated above     Objective:    BP 140/82   Pulse 75   Temp 98.1 F (36.7 C) (Oral)   Resp 14   Wt 265 lb (120.2 kg)   LMP 02/20/2016   SpO2 98%   BMI 44.10 kg/m   Wt Readings from Last 3 Encounters:  03/16/16 265 lb (120.2 kg)  01/14/16 268 lb (121.6 kg)  12/31/15 265 lb 8 oz (120.4 kg)    Physical Exam  Constitutional: She appears well-developed and well-nourished. No distress.  Weight loss of 3 pounds over last 2 months; morbidly obese  Cardiovascular: Normal rate and regular rhythm.   Pulmonary/Chest: Effort normal and breath sounds normal.  Abdominal: She exhibits no distension.  Musculoskeletal: She exhibits no edema.  Neurological: She is alert.  Psychiatric: She has a normal mood and affect.   Results for orders placed or performed in visit on 11/29/15  CBC with Differential/Platelet  Result Value Ref Range   WBC 4.4 3.8 - 10.8 K/uL   RBC 4.71 3.80 - 5.10 MIL/uL   Hemoglobin 13.4 11.7 - 15.5 g/dL   HCT 38.0 35.0 - 45.0 %   MCV 80.7 80.0 - 100.0  fL   MCH 28.5 27.0 - 33.0 pg   MCHC 35.3 32.0 - 36.0 g/dL   RDW 15.6 (H) 11.0 - 15.0 %   Platelets 584 (H) 140 - 400 K/uL   MPV 8.4 7.5 - 12.5 fL   Neutro Abs 2,728 1,500 - 7,800 cells/uL   Lymphs Abs 1,188 850 - 3,900 cells/uL   Monocytes Absolute 264 200 - 950 cells/uL   Eosinophils Absolute 132 15 - 500 cells/uL   Basophils Absolute 88 0 - 200 cells/uL   Neutrophils Relative % 62 %   Lymphocytes Relative 27 %   Monocytes Relative 6 %   Eosinophils Relative 3 %   Basophils Relative 2 %   Smear Review Criteria for review not met   BASIC METABOLIC PANEL WITH GFR  Result Value Ref Range   Sodium 142 135 - 146 mmol/L   Potassium 3.7 3.5 - 5.3 mmol/L   Chloride 105 98 - 110 mmol/L   CO2 25 20 - 31 mmol/L   Glucose, Bld 85 65 - 99 mg/dL   BUN 13 7 - 25 mg/dL   Creat 0.96 0.50 - 1.10 mg/dL   Calcium 9.4 8.6 - 10.2 mg/dL   GFR, Est African American >89 >=60 mL/min   GFR,  Est Non African American 79 >=60 mL/min  TSH  Result Value Ref Range   TSH 0.70 mIU/L      Assessment & Plan:   Problem List Items Addressed This Visit      Cardiovascular and Mediastinum   Hypertension (Chronic)    Continue current regimen; pt to keep working on weight loss, DASH guidelines; refilled the CCB; pt to contact me before next appt if systolic over [phone]      Relevant Medications   amLODipine (NORVASC) 5 MG tablet     Endocrine   IFG (impaired fasting glucose)    Last A1c was actually in normal range; will recheck in March        Hematopoietic and Hemostatic   Thrombocytosis (HCC) (Chronic)    Check platelet count today; explained may be due to inflammation; obesity is a pro-inflammatory condition        Other   Morbid obesity (HCC) (Chronic)    Encouragement given; see AVS          Follow up plan: Return in about 4 months (around 07/14/2016) for fasting labs and visit.  An after-visit summary was printed and given to the patient at [place].  Please see the patient instructions which may contain other information and recommendations beyond what is mentioned above in the assessment and plan.  Meds ordered this encounter  Medications  . amLODipine (NORVASC) 5 MG tablet    Sig: Take 1 tablet (5 mg total) by mouth daily.    Dispense:  90 tablet    Refill:  3    No orders of the defined types were placed in this encounter.

## 2016-04-17 ENCOUNTER — Telehealth: Payer: Self-pay | Admitting: Internal Medicine

## 2016-04-17 DIAGNOSIS — G4719 Other hypersomnia: Secondary | ICD-10-CM

## 2016-04-17 NOTE — Telephone Encounter (Signed)
Ria Comment with [facility] called and stated that patient's primary insurance [facility] requires that patient have HST. Pt has been contacted and is aware of in lab study being denied by primary insurance [employer] .    If agreeable to HST please cancel order for in lab and place order for HST. Thanks, Catha Gosselin

## 2016-04-20 NOTE — Telephone Encounter (Signed)
DR do you agree with HST?

## 2016-04-21 NOTE — Telephone Encounter (Signed)
Yes, ok for HST.

## 2016-04-21 NOTE — Telephone Encounter (Signed)
HST ordered.  °Nothing further needed.  ° °

## 2016-05-03 ENCOUNTER — Encounter: Payer: Self-pay | Admitting: Internal Medicine

## 2016-05-03 DIAGNOSIS — G4719 Other hypersomnia: Secondary | ICD-10-CM

## 2016-06-06 ENCOUNTER — Encounter: Payer: Self-pay | Admitting: Family Medicine

## 2016-06-08 MED ORDER — FLUTICASONE PROPIONATE 50 MCG/ACT NA SUSP: 2.0000 | Freq: Every day | NASAL | 6 refills | Status: DC

## 2016-06-16 ENCOUNTER — Encounter: Payer: Self-pay | Admitting: Family Medicine

## 2016-06-17 MED ORDER — BLOOD PRESSURE MONITOR/L CUFF MISC: 0 refills | Status: DC

## 2016-06-25 ENCOUNTER — Other Ambulatory Visit: Payer: Self-pay | Admitting: Family Medicine

## 2016-07-15 ENCOUNTER — Encounter: Payer: Self-pay | Admitting: Family Medicine

## 2016-07-15 ENCOUNTER — Ambulatory Visit (INDEPENDENT_AMBULATORY_CARE_PROVIDER_SITE_OTHER): Payer: 59 | Admitting: Family Medicine

## 2016-07-15 DIAGNOSIS — I10 Essential (primary) hypertension: Secondary | ICD-10-CM

## 2016-07-15 NOTE — Assessment & Plan Note (Signed)
Patient continues to struggle with weight; I believe if she lost significant weight, her pressures would improve; until then, will continue current medicines; I did explain that if she can lose weight, we can start to decrease/take away some of her medicine; avoid excessive sodium, decongestants

## 2016-07-15 NOTE — Patient Instructions (Signed)
Your goal blood pressure is less than 140 mmHg on top. Try to follow the DASH guidelines (DASH stands for Dietary Approaches to Stop Hypertension) Try to limit the sodium in your diet.  Ideally, consume less than 1.5 grams (less than 1,500mg ) per day. Do not add salt when cooking or at the table.  Check the sodium amount on labels when shopping, and choose items lower in sodium when given a choice. Avoid or limit foods that already contain a lot of sodium. Eat a diet rich in fruits and vegetables and whole grains.  Check out the information at familydoctor.org entitled "Nutrition for Weight Loss: What You Need to Know about Fad Diets" Try to lose between 1-2 pounds per week by taking in fewer calories and burning off more calories You can succeed by limiting portions, limiting foods dense in calories and fat, becoming more active, and drinking 8 glasses of water a day (64 ounces) Don't skip meals, especially breakfast, as skipping meals may alter your metabolism Do not use over-the-counter weight loss pills or gimmicks that claim rapid weight loss A healthy BMI (or body mass index) is between 18.5 and 24.9 You can calculate your ideal BMI at the [place] website [employer]fr

## 2016-07-15 NOTE — Assessment & Plan Note (Signed)
I offered help; offered referral to bariatric doctor, asked her if she wanted medicine, what could I do to help her lose weight; she wants to see if she can get more active with warmer weather coming and do it on her own; significant weight loss needed, encouragement given

## 2016-07-15 NOTE — Progress Notes (Signed)
BP 136/80   Pulse 80   Temp 98.9 F (37.2 C) (Oral)   Resp 16   Wt 274 lb 3.2 oz (124.4 kg)   SpO2 96%   BMI 45.63 kg/m    Subjective:    Patient ID: Mckenzie Lopez, female    DOB: 01-16-1986, 31 y.o.   MRN: [id number]  HPI: Mckenzie Lopez is a 31 y.o. female  Chief Complaint  Patient presents with  . Follow-up   Patient is here with her infant daughter for follow-up She tires to check her BP at home, but the cuff was too little She is trying to get a right size cuff and will talk to pharmacist Trying to stay away from salt and decongestants Using nasal spray if needed for allergies On medicines Flu shot UTD Seeing GYN for pap smears, next in October She struggles with her weight; she feels like her clothes are fitting looser; she thinks she is losing; she'll start walking when weather gets warm She did the sleep study, in-home study, but she doesn't think it was accurate; they never called her back; ordered by Dr. Ashby Dawes  Depression screen [facility] 2/9 07/15/2016 03/16/2016 01/14/2016 12/31/2015  Decreased Interest 0 0 0 0  Down, Depressed, Hopeless 0 0 0 0  PHQ - 2 Score 0 0 0 0   Relevant past medical, surgical, family and social history reviewed Past Medical History:  Diagnosis Date  . 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 Hyperlipidemia   . Hypertension   . IFG (impaired fasting glucose)   . Low back pain   . Migraines   . Morbid obesity ([place])    History reviewed. No pertinent surgical history.   Family History  Problem Relation Age of Onset  . Diabetes Mother   . Hypertension Mother   . Thyroid disease Mother   . Cancer Maternal Grandmother     breast  . Diabetes Maternal Grandfather   . Hypertension Maternal Grandfather   . Seizures Maternal Grandfather   . Cancer Maternal Aunt     breast  . Diabetes Sister   . Diabetes Paternal Grandmother   . Heart disease Neg Hx   . Stroke Neg Hx   . COPD Neg Hx    Social History  Substance Use  Topics  . Smoking status: Never Smoker  . Smokeless tobacco: Never Used  . Alcohol use No   Interim medical history since last visit reviewed. Allergies and medications reviewed  Review of Systems Per HPI unless specifically indicated above     Objective:    BP 136/80   Pulse 80   Temp 98.9 F (37.2 C) (Oral)   Resp 16   Wt 274 lb 3.2 oz (124.4 kg)   SpO2 96%   BMI 45.63 kg/m   Wt Readings from Last 3 Encounters:  07/15/16 274 lb 3.2 oz (124.4 kg)  03/16/16 265 lb (120.2 kg)  01/14/16 268 lb (121.6 kg)    Physical Exam  Constitutional: She appears well-developed and well-nourished. No distress.  Weight gain over 9 pounds pounds over last 4 months; morbidly obese  Cardiovascular: Normal rate and regular rhythm.   Pulmonary/Chest: Effort normal and breath sounds normal.  Abdominal: She exhibits no distension.  Musculoskeletal: She exhibits no edema.  Neurological: She is alert.  Psychiatric: She has a normal mood and affect.      Assessment & Plan:   Problem List Items Addressed This Visit      Cardiovascular and Mediastinum  Hypertension (Chronic)    Patient continues to struggle with weight; I believe if she lost significant weight, her pressures would improve; until then, will continue current medicines; I did explain that if she can lose weight, we can start to decrease/take away some of her medicine; avoid excessive sodium, decongestants      Relevant Medications   labetalol (NORMODYNE) 200 MG tablet     Other   Morbid obesity ([place]) (Chronic)    I offered help; offered referral to bariatric doctor, asked her if she wanted medicine, what could I do to help her lose weight; she wants to see if she can get more active with warmer weather coming and do it on her own; significant weight loss needed, encouragement given          Follow up plan: Return in about 6 months (around 01/15/2017) for fasting labs and visit with Dr. Sanda Klein.  An after-visit summary was  printed and given to the patient at [place].  Please see the patient instructions which may contain other information and recommendations beyond what is mentioned above in the assessment and plan.  Meds ordered this encounter  Medications  . labetalol (NORMODYNE) 200 MG tablet    Sig: Take by mouth.    No orders of the defined types were placed in this encounter.

## 2016-07-16 ENCOUNTER — Encounter: Payer: Self-pay | Admitting: Internal Medicine

## 2016-07-27 DIAGNOSIS — G4733 Obstructive sleep apnea (adult) (pediatric): Secondary | ICD-10-CM | POA: Diagnosis not present

## 2016-07-29 ENCOUNTER — Telehealth: Payer: Self-pay | Admitting: *Deleted

## 2016-07-29 DIAGNOSIS — G4733 Obstructive sleep apnea (adult) (pediatric): Secondary | ICD-10-CM

## 2016-07-29 NOTE — Telephone Encounter (Signed)
Pt informed of HST results. Order placed for CPAP. Nothing further needed. 

## 2016-08-17 ENCOUNTER — Ambulatory Visit: Payer: Self-pay | Admitting: Obstetrics and Gynecology

## 2016-08-18 ENCOUNTER — Encounter: Payer: Self-pay | Admitting: Family Medicine

## 2016-08-18 ENCOUNTER — Other Ambulatory Visit: Payer: Self-pay | Admitting: Family Medicine

## 2016-08-24 ENCOUNTER — Encounter: Payer: Self-pay | Admitting: Family Medicine

## 2016-08-24 ENCOUNTER — Ambulatory Visit (INDEPENDENT_AMBULATORY_CARE_PROVIDER_SITE_OTHER): Payer: 59 | Admitting: Family Medicine

## 2016-08-24 DIAGNOSIS — I10 Essential (primary) hypertension: Secondary | ICD-10-CM | POA: Diagnosis not present

## 2016-08-24 DIAGNOSIS — G8929 Other chronic pain: Secondary | ICD-10-CM | POA: Diagnosis not present

## 2016-08-24 DIAGNOSIS — M5441 Lumbago with sciatica, right side: Secondary | ICD-10-CM | POA: Diagnosis not present

## 2016-08-24 MED ORDER — HYDRALAZINE HCL 50 MG PO TABS: 50.0000 mg | ORAL_TABLET | Freq: Three times a day (TID) | ORAL | 1 refills | Status: DC

## 2016-08-24 MED ORDER — LABETALOL HCL 200 MG PO TABS: 200.0000 mg | ORAL_TABLET | Freq: Three times a day (TID) | ORAL | 1 refills | Status: DC

## 2016-08-24 NOTE — Assessment & Plan Note (Signed)
With right-sided sciatica, possible piriformis syndrome; refer to PT; weight loss is imperative; tylenol over NSAIDs because of HTN

## 2016-08-24 NOTE — Progress Notes (Addendum)
* [facility]     Assessment and Plan:  The patient is a 31 year old female with symptoms and signs of obstructive sleep apnea, recently seen in the office and sent for a sleep study which was positive, she has since been recommended to be started on an AutoPap with pressure range of 5-20.  Morbid obesity. -Likely contributing to dyspnea, discussed importance of weight loss.   Obstructive sleep apnea -Proceed with AutoPap -Sleep study 05/03/16; AHI equal 17  Date: 08/24/2016  MRN# [id number] Quana Jahaira Earnhart [birth year]   Kiera Annete Ayuso is a 31 y.o. old female seen in follow up for chief complaint of  Chief Complaint  Patient presents with  . Follow-up    sleep issues:      HPI:   This is a 31 year old female who was  discharged from the hospital on 11/16/15 after a 3 day admission for pulmonary edema complicating recent delivery.   She is morbidly obese and has a history of hypertension, she was seen by [place] in regards to possible OSA. She was recommended to have a HST, but she was going to follow up with her physician at [place].   She ended up having the sleep study locally, and was recommended to be started on an autoPAP with pressure range of 5-20. However due to delay from the time the study was ordered, her insurance now requires a face-to-face.    Medication:   Outpatient Encounter Prescriptions as of 08/25/2016  Medication Sig  . amLODipine (NORVASC) 5 MG tablet Take 1 tablet (5 mg total) by mouth daily.  Marland Kitchen aspirin EC 81 MG tablet Take 1 tablet (81 mg total) by mouth daily.  . Blood Pressure Monitoring (BLOOD PRESSURE MONITOR/L CUFF) MISC Large cuff; check BP; fluctuating blood pressures; HTN; LON 99 months  . Cholecalciferol (VITAMIN D) 2000 UNITS CAPS Take 2,000 Units by mouth daily.  . fluticasone (FLONASE) 50 MCG/ACT nasal spray Place 2 sprays into both nostrils daily.  . hydrALAZINE (APRESOLINE) 50 MG tablet Take 1 tablet (50 mg total)  by mouth 3 (three) times daily.  . hydrocortisone valerate cream (WESTCORT) 0.2 % Apply 1 application topically as needed (itching).   . labetalol (NORMODYNE) 200 MG tablet Take 1 tablet (200 mg total) by mouth 3 (three) times daily.   No facility-administered encounter medications on file as of 08/25/2016.      Allergies:  Chlorthalidone  Review of Systems: Gen:  Denies  fever, sweats. HEENT: Denies blurred vision. Cvc:  No dizziness, chest pain or heaviness Resp:   Denies cough or sputum porduction. Gi: Denies swallowing difficulty, stomach pain.  Gu:  Denies bladder incontinence, burning urine Ext:   No Joint pain, stiffness. Skin: No skin rash, easy bruising. Endoc:  No polyuria, polydipsia. Psych: No depression, insomnia. Other:  All other systems were reviewed and found to be negative other than what is mentioned in the HPI.   Physical Examination:   VS: BP (!) 142/86 (BP Location: Left Arm, Cuff Size: Normal)   Pulse 77   Wt 277 lb (125.6 kg)   LMP 08/24/2016   SpO2 97%   BMI 46.10 kg/m   General Appearance: No distress  Neuro:without focal findings,  speech normal,  HEENT: PERRLA, EOM intact. Pulmonary: normal breath sounds, No wheezing.   CardiovascularNormal S1,S2.  No m/r/g.   Abdomen: Benign, Soft, non-tender. Renal:  No costovertebral tenderness  GU:  Not performed at this time. Endoc: No evident thyromegaly, no signs of acromegaly. Skin:  warm, no rash. Extremities: normal, no cyanosis, clubbing.   LABORATORY PANEL:   CBC No results for input(s): WBC, HGB, HCT, PLT in the last 168 hours. ------------------------------------------------------------------------------------------------------------------  Chemistries  No results for input(s): NA, K, CL, CO2, GLUCOSE, BUN, CREATININE, CALCIUM, MG, AST, ALT, ALKPHOS, BILITOT in the last 168 hours.  Invalid input(s):  GFRCGP ------------------------------------------------------------------------------------------------------------------  Cardiac Enzymes No results for input(s): TROPONINI in the last 168 hours. ------------------------------------------------------------  RADIOLOGY:   No results found for this or any previous visit. No results found for this or any previous visit. ------------------------------------------------------------------------------------------------------------------  Thank  you for allowing [facility] to assist in the care of your patient. Our recommendations are noted above.  Please contact us if we can be of further service.   Marda Stalker, MD.  [facility] Office Number: [phone]  Patricia Pesa, M.D.  Merton Border, M.D  08/24/2016

## 2016-08-24 NOTE — Patient Instructions (Addendum)
Increase the frequency of the labetalol back up to every 8 hours (three times a day) Check out the information at familydoctor.org entitled "Nutrition for Weight Loss: What You Need to Know about Fad Diets" Try to lose between 1-2 pounds per week by taking in fewer calories and burning off more calories You can succeed by limiting portions, limiting foods dense in calories and fat, becoming more active, and drinking 8 glasses of water a day (64 ounces) Don't skip meals, especially breakfast, as skipping meals may alter your metabolism Do not use over-the-counter weight loss pills or gimmicks that claim rapid weight loss A healthy BMI (or body mass index) is between 18.5 and 24.9 You can calculate your ideal BMI at the [place] website ClubMonetize.fr We'll have you see the nutritionist We'll get you in to see a physical therapist Back Exercises If you have pain in your back, do these exercises 2-3 times each day or as told by your doctor. When the pain goes away, do the exercises once each day, but repeat the steps more times for each exercise (do more repetitions). If you do not have pain in your back, do these exercises once each day or as told by your doctor. Exercises Single Knee to Chest   Do these steps 3-5 times in a row for each leg: 1. Lie on your back on a firm bed or the floor with your legs stretched out. 2. Bring one knee to your chest. 3. Hold your knee to your chest by grabbing your knee or thigh. 4. Pull on your knee until you feel a gentle stretch in your lower back. 5. Keep doing the stretch for 10-30 seconds. 6. Slowly let go of your leg and straighten it. Pelvic Tilt   Do these steps 5-10 times in a row: 1. Lie on your back on a firm bed or the floor with your legs stretched out. 2. Bend your knees so they point up to the ceiling. Your feet should be flat on the floor. 3. Tighten your lower belly (abdomen) muscles to press  your lower back against the floor. This will make your tailbone point up to the ceiling instead of pointing down to your feet or the floor. 4. Stay in this position for 5-10 seconds while you gently tighten your muscles and breathe evenly. Cat-Cow   Do these steps until your lower back bends more easily: 1. Get on your hands and knees on a firm surface. Keep your hands under your shoulders, and keep your knees under your hips. You may put padding under your knees. 2. Let your head hang down, and make your tailbone point down to the floor so your lower back is round like the back of a cat. 3. Stay in this position for 5 seconds. 4. Slowly lift your head and make your tailbone point up to the ceiling so your back hangs low (sags) like the back of a cow. 5. Stay in this position for 5 seconds. Press-Ups   Do these steps 5-10 times in a row: 1. Lie on your belly (face-down) on the floor. 2. Place your hands near your head, about shoulder-width apart. 3. While you keep your back relaxed and keep your hips on the floor, slowly straighten your arms to raise the top half of your body and lift your shoulders. Do not use your back muscles. To make yourself more comfortable, you may change where you place your hands. 4. Stay in this position for 5 seconds. 5. Slowly  return to lying flat on the floor. Bridges   Do these steps 10 times in a row: 1. Lie on your back on a firm surface. 2. Bend your knees so they point up to the ceiling. Your feet should be flat on the floor. 3. Tighten your butt muscles and lift your butt off of the floor until your waist is almost as high as your knees. If you do not feel the muscles working in your butt and the back of your thighs, slide your feet 1-2 inches farther away from your butt. 4. Stay in this position for 3-5 seconds. 5. Slowly lower your butt to the floor, and let your butt muscles relax. If this exercise is too easy, try doing it with your arms crossed over  your chest. Belly Crunches   Do these steps 5-10 times in a row: 1. Lie on your back on a firm bed or the floor with your legs stretched out. 2. Bend your knees so they point up to the ceiling. Your feet should be flat on the floor. 3. Cross your arms over your chest. 4. Tip your chin a little bit toward your chest but do not bend your neck. 5. Tighten your belly muscles and slowly raise your chest just enough to lift your shoulder blades a tiny bit off of the floor. 6. Slowly lower your chest and your head to the floor. Back Lifts  Do these steps 5-10 times in a row: 1. Lie on your belly (face-down) with your arms at your sides, and rest your forehead on the floor. 2. Tighten the muscles in your legs and your butt. 3. Slowly lift your chest off of the floor while you keep your hips on the floor. Keep the back of your head in line with the curve in your back. Look at the floor while you do this. 4. Stay in this position for 3-5 seconds. 5. Slowly lower your chest and your face to the floor. Contact a doctor if:  Your back pain gets a lot worse when you do an exercise.  Your back pain does not lessen 2 hours after you exercise. If you have any of these problems, stop doing the exercises. Do not do them again unless your doctor says it is okay. Get help right away if:  You have sudden, very bad back pain. If this happens, stop doing the exercises. Do not do them again unless your doctor says it is okay. This information is not intended to replace advice given to you by your health care provider. Make sure you discuss any questions you have with your health care provider. Document Released: 05/30/2010 Document Revised: 10/03/2015 Document Reviewed: 06/21/2014 Elsevier Interactive Patient Education  2017 [employer].

## 2016-08-24 NOTE — Progress Notes (Signed)
BP (!) 142/98 (BP Location: Left Arm, Cuff Size: Large)   Pulse 86   Temp 98.1 F (36.7 C) (Oral)   Resp 16   Wt 278 lb (126.1 kg)   LMP 08/24/2016   SpO2 97%   BMI 46.26 kg/m    Subjective:    Patient ID: Mckenzie Lopez, female    DOB: January 29, 1986, 31 y.o.   MRN: [id number]  HPI: Mckenzie Lopez is a 31 y.o. female  Chief Complaint  Patient presents with  . Sciatica    right lower back radiates down butt into leg    Back Pain  This is a chronic problem. The current episode started in the past 7 days. The problem occurs intermittently. The problem has been gradually worsening since onset. The pain is present in the lumbar spine and gluteal. The quality of the pain is described as aching (sharp pain and aching). The pain is moderate. The symptoms are aggravated by sitting and bending. Associated symptoms include leg pain. Pertinent negatives include no abdominal pain, bladder incontinence, bowel incontinence, dysuria, fever, numbness or weakness. Risk factors include lack of exercise, obesity and sedentary lifestyle. She has tried ice and heat (biofreeze rub, helps some) for the symptoms.   Sitting down and twisting around to the right makes it worse Stiffness some No problems with standing Was putting her daughter in a walker and had to stop, where she couldn't even move, felt locked; able to straighten back up  She struggles with obesity; she has given up fried foods; gave up sodas; watching portions; obesity runs in the family; small portions; drinks water, could use more; she continues to gain weight though; she is up 13 pounds over the last 6 months  HTN; "at home it's much better;" Saturday it was 140/85; she checks about every other day; 140/82 another day I reviewed the blood pressure medicines; she cut back on her labetalol from every 8 hours to every 12 hours  Depression screen [facility] 2/9 08/24/2016 07/15/2016 03/16/2016 01/14/2016 12/31/2015  Decreased Interest 0 0  0 0 0  Down, Depressed, Hopeless 0 0 0 0 0  PHQ - 2 Score 0 0 0 0 0   Relevant past medical, surgical, family and social history reviewed Past Medical History:  Diagnosis Date  . 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 Hyperlipidemia   . Hypertension   . IFG (impaired fasting glucose)   . Low back pain   . Migraines   . Morbid obesity ([place])    History reviewed. No pertinent surgical history. Family History  Problem Relation Age of Onset  . Diabetes Mother   . Hypertension Mother   . Thyroid disease Mother   . Cancer Maternal Grandmother     breast  . Diabetes Maternal Grandfather   . Hypertension Maternal Grandfather   . Seizures Maternal Grandfather   . Cancer Maternal Aunt     breast  . Diabetes Sister   . Diabetes Paternal Grandmother   . Heart disease Neg Hx   . Stroke Neg Hx   . COPD Neg Hx    Social History  Substance Use Topics  . Smoking status: Never Smoker  . Smokeless tobacco: Never Used  . Alcohol use No   Interim medical history since last visit reviewed. Allergies and medications reviewed  Review of Systems  Constitutional: Negative for fever.  Gastrointestinal: Negative for abdominal pain and bowel incontinence.  Genitourinary: Negative for bladder incontinence and dysuria.  Musculoskeletal: Positive for back  pain.  Neurological: Negative for weakness and numbness.   Per HPI unless specifically indicated above     Objective:    BP (!) 142/98 (BP Location: Left Arm, Cuff Size: Large)   Pulse 86   Temp 98.1 F (36.7 C) (Oral)   Resp 16   Wt 278 lb (126.1 kg)   LMP 08/24/2016   SpO2 97%   BMI 46.26 kg/m   Wt Readings from Last 3 Encounters:  08/24/16 278 lb (126.1 kg)  07/15/16 274 lb 3.2 oz (124.4 kg)  03/16/16 265 lb (120.2 kg)    Physical Exam  Constitutional: She appears well-developed and well-nourished. No distress.  Weight gain over 13 pounds pounds over last 5+ months; morbidly obese  Cardiovascular: Normal rate and regular  rhythm.   Pulmonary/Chest: Effort normal and breath sounds normal.  Abdominal: She exhibits no distension.  Musculoskeletal: She exhibits no edema.       Lumbar back: She exhibits decreased range of motion and tenderness. She exhibits no bony tenderness, no swelling, no edema, no deformity and no spasm.       Back:  Neurological: She is alert.  LE strength 5/5  Skin: No rash (spefically no rash over the lower back suggestive of shingles) noted. No pallor.  Psychiatric: She has a normal mood and affect. Her mood appears not anxious. She does not exhibit a depressed mood.    Results for orders placed or performed in visit on 03/16/16  CBC w/Diff/Platelet  Result Value Ref Range   WBC 5.4 3.8 - 10.8 K/uL   RBC 4.17 3.80 - 5.10 MIL/uL   Hemoglobin 12.0 11.7 - 15.5 g/dL   HCT 34.5 (L) 35.0 - 45.0 %   MCV 82.7 80.0 - 100.0 fL   MCH 28.8 27.0 - 33.0 pg   MCHC 34.8 32.0 - 36.0 g/dL   RDW 13.5 11.0 - 15.0 %   Platelets 429 (H) 140 - 400 K/uL   MPV 8.6 7.5 - 12.5 fL   Neutro Abs 3,240 1,500 - 7,800 cells/uL   Lymphs Abs 1,566 850 - 3,900 cells/uL   Monocytes Absolute 486 200 - 950 cells/uL   Eosinophils Absolute 108 15 - 500 cells/uL   Basophils Absolute 0 0 - 200 cells/uL   Neutrophils Relative % 60 %   Lymphocytes Relative 29 %   Monocytes Relative 9 %   Eosinophils Relative 2 %   Basophils Relative 0 %   Smear Review Criteria for review not met       Assessment & Plan:   Problem List Items Addressed This Visit      Cardiovascular and Mediastinum   Hypertension (Chronic)    Increase labetalol back to every 8 hours; return in 2 weeks; urged weight loss      Relevant Medications   hydrALAZINE (APRESOLINE) 50 MG tablet   labetalol (NORMODYNE) 200 MG tablet     Other   Morbid obesity (HCC) (Chronic)    Check thyroid; encouragement given; start walking 10 minutes a day, start low and build up gradually; refer to nutritionist      Relevant Orders   Amb ref to Medical  Nutrition Therapy-MNT   Low back pain    With right-sided sciatica, possible piriformis syndrome; refer to PT; weight loss is imperative; tylenol over NSAIDs because of HTN      Relevant Orders   Ambulatory referral to Physical Therapy   Amb ref to Medical Nutrition Therapy-MNT       Follow up plan:  Return in about 2 weeks (around 09/07/2016) for twenty minute follow-up with fasting labs.  An after-visit summary was printed and given to the patient at [place].  Please see the patient instructions which may contain other information and recommendations beyond what is mentioned above in the assessment and plan.  Meds ordered this encounter  Medications  . hydrALAZINE (APRESOLINE) 50 MG tablet    Sig: Take 1 tablet (50 mg total) by mouth 3 (three) times daily.    Dispense:  270 tablet    Refill:  1  . labetalol (NORMODYNE) 200 MG tablet    Sig: Take 1 tablet (200 mg total) by mouth 3 (three) times daily.    Dispense:  270 tablet    Refill:  1    Orders Placed This Encounter  Procedures  . Ambulatory referral to Physical Therapy  . Amb ref to Medical Nutrition Therapy-MNT

## 2016-08-24 NOTE — Assessment & Plan Note (Signed)
Increase labetalol back to every 8 hours; return in 2 weeks; urged weight loss

## 2016-08-24 NOTE — Assessment & Plan Note (Signed)
Check thyroid; encouragement given; start walking 10 minutes a day, start low and build up gradually; refer to nutritionist

## 2016-08-25 ENCOUNTER — Encounter: Payer: Self-pay | Admitting: Internal Medicine

## 2016-08-25 ENCOUNTER — Ambulatory Visit (INDEPENDENT_AMBULATORY_CARE_PROVIDER_SITE_OTHER): Payer: 59 | Admitting: Internal Medicine

## 2016-08-25 VITALS — BP 142/86 | HR 77 | Wt 277.0 lb

## 2016-08-25 DIAGNOSIS — G4733 Obstructive sleep apnea (adult) (pediatric): Secondary | ICD-10-CM

## 2016-08-25 NOTE — Patient Instructions (Addendum)
--  Ok to proceed with PAP initiation.

## 2016-08-25 NOTE — Addendum Note (Signed)
Addended by: Oscar La R on: 08/25/2016 12:12 PM   Modules accepted: Orders

## 2016-08-28 ENCOUNTER — Telehealth: Payer: Self-pay | Admitting: Internal Medicine

## 2016-08-28 NOTE — Telephone Encounter (Signed)
Tiffany with [employer] called and stated that she had contacted patient to set up cpap appointment.  Pt declined cpap set up at this time due to having a large deductible. Pt stated that she would put the order on hold for now until she got the deductible down.  Tiffany stated that she advised the patient that the order was good for a year, however, the chart notes might expire if she waited to long.  This is just an FYI for [place]. This has also been noted in the referral. Catha Gosselin

## 2016-09-07 ENCOUNTER — Ambulatory Visit (INDEPENDENT_AMBULATORY_CARE_PROVIDER_SITE_OTHER): Payer: 59 | Admitting: Family Medicine

## 2016-09-07 ENCOUNTER — Encounter: Payer: Self-pay | Admitting: Family Medicine

## 2016-09-07 DIAGNOSIS — R7301 Impaired fasting glucose: Secondary | ICD-10-CM

## 2016-09-07 DIAGNOSIS — Z5181 Encounter for therapeutic drug level monitoring: Secondary | ICD-10-CM | POA: Diagnosis not present

## 2016-09-07 DIAGNOSIS — I10 Essential (primary) hypertension: Secondary | ICD-10-CM

## 2016-09-07 DIAGNOSIS — E782 Mixed hyperlipidemia: Secondary | ICD-10-CM

## 2016-09-07 DIAGNOSIS — D473 Essential (hemorrhagic) thrombocythemia: Secondary | ICD-10-CM | POA: Diagnosis not present

## 2016-09-07 DIAGNOSIS — D75839 Thrombocytosis, unspecified: Secondary | ICD-10-CM

## 2016-09-07 LAB — COMPREHENSIVE METABOLIC PANEL
AG Ratio: 1.5 Ratio (ref 1.0–2.5)
ALBUMIN: 4.3 g/dL (ref 3.6–5.1)
ALT: 8 U/L (ref 6–29)
AST: 9 U/L — AB (ref 10–30)
Alkaline Phosphatase: 41 U/L (ref 33–115)
BUN/Creatinine Ratio: 10.7 Ratio (ref 6–22)
BUN: 11 mg/dL (ref 7–25)
CALCIUM: 9.7 mg/dL (ref 8.6–10.2)
CHLORIDE: 105 mmol/L (ref 98–110)
CO2: 20 mmol/L (ref 20–31)
CREATININE: 1.03 mg/dL (ref 0.50–1.10)
GFR, Est African American: 83 mL/min (ref >=60)
GFR, Est Non African American: 72 mL/min (ref >=60)
GLUCOSE: 81 mg/dL (ref 65–99)
Globulin: 2.9 g/dL (ref 1.9–3.7)
Potassium: 4.3 mmol/L (ref 3.5–5.3)
Sodium: 138 mmol/L (ref 135–146)
Total Bilirubin: 0.4 mg/dL (ref 0.2–1.2)
Total Protein: 7.2 g/dL (ref 6.1–8.1)

## 2016-09-07 LAB — CBC WITH DIFFERENTIAL/PLATELET
Basophils Absolute: 53 cells/uL (ref 0–200)
Basophils Relative: 1 %
Eosinophils Absolute: 159 cells/uL (ref 15–500)
Eosinophils Relative: 3 %
HCT: 37.7 % (ref 35.0–45.0)
HEMOGLOBIN: 12.8 g/dL (ref 11.7–15.5)
LYMPHS ABS: 1431 {cells}/uL (ref 850–3900)
Lymphocytes Relative: 27 %
MCH: 27.9 pg (ref 27.0–33.0)
MCHC: 34 g/dL (ref 32.0–36.0)
MCV: 82.1 fL (ref 80.0–100.0)
MPV: 8.2 fL (ref 7.5–12.5)
Monocytes Absolute: 371 cells/uL (ref 200–950)
Monocytes Relative: 7 %
NEUTROS ABS: 3286 {cells}/uL (ref 1500–7800)
NEUTROS PCT: 62 %
Platelets: 435 10*3/uL — ABNORMAL HIGH (ref 140–400)
RBC: 4.59 MIL/uL (ref 3.80–5.10)
RDW: 14.3 % (ref 11.0–15.0)
WBC: 5.3 10*3/uL (ref 3.8–10.8)

## 2016-09-07 LAB — LIPID PANEL
CHOL/HDL RATIO: 4.1 ratio (ref <5.0)
CHOLESTEROL: 176 mg/dL (ref <200)
HDL: 43 mg/dL — ABNORMAL LOW (ref >50)
LDL Cholesterol: 119 mg/dL — ABNORMAL HIGH (ref <100)
Triglycerides: 70 mg/dL (ref <150)
VLDL: 14 mg/dL (ref <30)

## 2016-09-07 LAB — TSH: TSH: 2.22 m[IU]/L

## 2016-09-07 MED ORDER — NALTREXONE-BUPROPION HCL ER 8-90 MG PO TB12: ORAL_TABLET | ORAL | 0 refills | Status: DC

## 2016-09-07 NOTE — Assessment & Plan Note (Signed)
Better control with medicine adjustment last visit; work on weight loss

## 2016-09-07 NOTE — Progress Notes (Signed)
BP 126/82   Pulse 81   Temp 98.4 F (36.9 C) (Oral)   Resp 14   Wt 274 lb 8 oz (124.5 kg)   LMP 08/24/2016   SpO2 98%   BMI 45.68 kg/m    Subjective:    Patient ID: Mckenzie Lopez, female    DOB: 05/11/1986, 31 y.o.   MRN: [id number]  HPI: Keoni Keyanah Kozicki is a 31 y.o. female  Chief Complaint  Patient presents with  . Follow-up    HPI She has lost a little weight; grilled and baked instead of fried foods Drinking more weater [employer] foods had bothered her stomach after having her baby; okay now Back on track with medicines No trouble with remembering because using phone for reminders Thinking about pills for weight loss; if she was to use a pill, it would help her; she is already doing the right steps and feels like she needs something extra; will go walking with her mother; needs an extra boost No eating disorders, no seizure hx, not on pain medicine Went back for the CPAP but insurance not paying for much; too expensive  Depression screen [facility] 2/9 08/24/2016 07/15/2016 03/16/2016 01/14/2016 12/31/2015  Decreased Interest 0 0 0 0 0  Down, Depressed, Hopeless 0 0 0 0 0  PHQ - 2 Score 0 0 0 0 0   Relevant past medical, surgical, family and social history reviewed Past Medical History:  Diagnosis Date  . 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 Hyperlipidemia   . Hypertension   . IFG (impaired fasting glucose)   . Low back pain   . Migraines   . Morbid obesity ([place])    No past surgical history on file. Family History  Problem Relation Age of Onset  . Diabetes Mother   . Hypertension Mother   . Thyroid disease Mother   . Cancer Maternal Grandmother     breast  . Diabetes Maternal Grandfather   . Hypertension Maternal Grandfather   . Seizures Maternal Grandfather   . Cancer Maternal Aunt     breast  . Diabetes Sister   . Diabetes Paternal Grandmother   . Heart disease Neg Hx   . Stroke Neg Hx   . COPD Neg Hx    Social History  Substance Use Topics  .  Smoking status: Never Smoker  . Smokeless tobacco: Never Used  . Alcohol use No   Interim medical history since last visit reviewed. Allergies and medications reviewed  Review of Systems Per HPI unless specifically indicated above     Objective:    BP 126/82   Pulse 81   Temp 98.4 F (36.9 C) (Oral)   Resp 14   Wt 274 lb 8 oz (124.5 kg)   LMP 08/24/2016   SpO2 98%   BMI 45.68 kg/m   Wt Readings from Last 3 Encounters:  09/07/16 274 lb 8 oz (124.5 kg)  08/25/16 277 lb (125.6 kg)  08/24/16 278 lb (126.1 kg)    Physical Exam  Constitutional: She appears well-developed and well-nourished.  HENT:  Mouth/Throat: Mucous membranes are normal.  Eyes: EOM are normal. No scleral icterus.  Cardiovascular: Normal rate and regular rhythm.   Pulmonary/Chest: Effort normal and breath sounds normal.  Abdominal: She exhibits no distension.  Musculoskeletal: She exhibits no edema.  Neurological: She is alert.  Psychiatric: She has a normal mood and affect. Her behavior is normal.    Results for orders placed or performed in visit on 03/16/16  CBC w/Diff/Platelet  Result Value Ref Range   WBC 5.4 3.8 - 10.8 K/uL   RBC 4.17 3.80 - 5.10 MIL/uL   Hemoglobin 12.0 11.7 - 15.5 g/dL   HCT 34.5 (L) 35.0 - 45.0 %   MCV 82.7 80.0 - 100.0 fL   MCH 28.8 27.0 - 33.0 pg   MCHC 34.8 32.0 - 36.0 g/dL   RDW 13.5 11.0 - 15.0 %   Platelets 429 (H) 140 - 400 K/uL   MPV 8.6 7.5 - 12.5 fL   Neutro Abs 3,240 1,500 - 7,800 cells/uL   Lymphs Abs 1,566 850 - 3,900 cells/uL   Monocytes Absolute 486 200 - 950 cells/uL   Eosinophils Absolute 108 15 - 500 cells/uL   Basophils Absolute 0 0 - 200 cells/uL   Neutrophils Relative % 60 %   Lymphocytes Relative 29 %   Monocytes Relative 9 %   Eosinophils Relative 2 %   Basophils Relative 0 %   Smear Review Criteria for review not met       Assessment & Plan:   Problem List Items Addressed This Visit      Cardiovascular and Mediastinum   Hypertension  (Chronic)    Better control with medicine adjustment last visit; work on weight loss        Endocrine   IFG (impaired fasting glucose)    Check A1c and glucose; weight loss will help      Relevant Orders   Hemoglobin A1c     Hematopoietic and Hemostatic   Thrombocytosis (HCC) (Chronic)    Check labs      Relevant Orders   CBC with Differential/Platelet     Other   Morbid obesity (HCC) (Chronic)    Start contrave; see AVS; return in 6 weeks      Relevant Medications   Naltrexone-Bupropion HCl ER 8-90 MG TB12   Other Relevant Orders   TSH   Medication monitoring encounter    Check labs      Relevant Orders   COMPLETE METABOLIC PANEL WITH GFR   Hyperlipidemia    Check labs; work on weight loss      Relevant Orders   Lipid panel       Follow up plan: Return in about 6 weeks (around 10/19/2016) for follow-up visit with Dr. Sanda Klein.  An after-visit summary was printed and given to the patient at [place].  Please see the patient instructions which may contain other information and recommendations beyond what is mentioned above in the assessment and plan.  Meds ordered this encounter  Medications  . Naltrexone-Bupropion HCl ER 8-90 MG TB12    Sig: One by mouth daily x 1 week, then 1 pill BID x 1 week, then 2 pills in AM and 1 pill in PM x 1 week, then 2 pills BID    Dispense:  78 tablet    Refill:  0    Orders Placed This Encounter  Procedures  . Hemoglobin A1c  . COMPLETE METABOLIC PANEL WITH GFR  . CBC with Differential/Platelet  . TSH  . Lipid panel

## 2016-09-07 NOTE — Assessment & Plan Note (Signed)
Check labs 

## 2016-09-07 NOTE — Patient Instructions (Signed)
Start the new weight loss medicine In conjunction with all the good things you're doing Check out the information at familydoctor.org entitled "Nutrition for Weight Loss: What You Need to Know about Fad Diets" Try to lose between 1-2 pounds per week by taking in fewer calories and burning off more calories You can succeed by limiting portions, limiting foods dense in calories and fat, becoming more active, and drinking 8 glasses of water a day (64 ounces) Don't skip meals, especially breakfast, as skipping meals may alter your metabolism Do not use over-the-counter weight loss pills or gimmicks that claim rapid weight loss A healthy BMI (or body mass index) is between 18.5 and 24.9 You can calculate your ideal BMI at the [place] website ClubMonetize.fr Increase activity to 150 minutes per week

## 2016-09-07 NOTE — Assessment & Plan Note (Signed)
Check A1c and glucose; weight loss will help

## 2016-09-07 NOTE — Assessment & Plan Note (Signed)
Start contrave; see AVS; return in 6 weeks

## 2016-09-07 NOTE — Assessment & Plan Note (Signed)
Check labs; work on weight loss

## 2016-09-08 ENCOUNTER — Encounter: Payer: Self-pay | Admitting: Family Medicine

## 2016-09-08 LAB — HEMOGLOBIN A1C
Hgb A1c MFr Bld: 4.8 % (ref <5.7)
Mean Plasma Glucose: 91 mg/dL

## 2016-09-09 ENCOUNTER — Encounter: Payer: Self-pay | Admitting: Family Medicine

## 2016-09-14 ENCOUNTER — Ambulatory Visit: Payer: 59 | Attending: Family Medicine

## 2016-09-14 ENCOUNTER — Encounter: Payer: Self-pay | Admitting: Family Medicine

## 2016-09-14 DIAGNOSIS — M5441 Lumbago with sciatica, right side: Secondary | ICD-10-CM | POA: Insufficient documentation

## 2016-09-14 DIAGNOSIS — G8929 Other chronic pain: Secondary | ICD-10-CM | POA: Insufficient documentation
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
## 2016-09-16 ENCOUNTER — Ambulatory Visit: Payer: 59

## 2016-09-21 ENCOUNTER — Ambulatory Visit: Payer: 59

## 2016-09-28 ENCOUNTER — Ambulatory Visit: Payer: 59

## 2016-10-13 ENCOUNTER — Encounter: Payer: Self-pay | Admitting: Obstetrics and Gynecology

## 2016-10-13 ENCOUNTER — Ambulatory Visit (INDEPENDENT_AMBULATORY_CARE_PROVIDER_SITE_OTHER): Payer: 59 | Admitting: Obstetrics and Gynecology

## 2016-10-13 VITALS — BP 140/100 | HR 72 | Ht 65.0 in | Wt 276.0 lb

## 2016-10-13 DIAGNOSIS — Z1231 Encounter for screening mammogram for malignant neoplasm of breast: Secondary | ICD-10-CM | POA: Diagnosis not present

## 2016-10-13 DIAGNOSIS — Z01419 Encounter for gynecological examination (general) (routine) without abnormal findings: Secondary | ICD-10-CM | POA: Diagnosis not present

## 2016-10-13 DIAGNOSIS — Z124 Encounter for screening for malignant neoplasm of cervix: Secondary | ICD-10-CM | POA: Diagnosis not present

## 2016-10-13 DIAGNOSIS — Z803 Family history of malignant neoplasm of breast: Secondary | ICD-10-CM | POA: Diagnosis not present

## 2016-10-13 DIAGNOSIS — Z1239 Encounter for other screening for malignant neoplasm of breast: Secondary | ICD-10-CM

## 2016-10-13 DIAGNOSIS — N870 Mild cervical dysplasia: Secondary | ICD-10-CM | POA: Insufficient documentation

## 2016-10-13 DIAGNOSIS — Z3046 Encounter for surveillance of implantable subdermal contraceptive: Secondary | ICD-10-CM

## 2016-10-13 DIAGNOSIS — Z1151 Encounter for screening for human papillomavirus (HPV): Secondary | ICD-10-CM | POA: Diagnosis not present

## 2016-10-13 NOTE — Progress Notes (Signed)
Chief Complaint  Patient presents with  . Gynecologic Exam     HPI:      [name] is a 31 y.o. G1P0 who LMP was Patient's last menstrual period was 09/17/2016 (exact date)., presents today for her annual examination.  Her menses are absent due to nexplanon.  Dysmenorrhea none. She does have intermenstrual bleeding.  Sex activity: not sexually active.  Last Pap: April 09, 2015  Results were: no abnormalities /neg HPV DNA  Hx of STDs: chlamydia, CIN 1 in 2014  There is a FH of breast cancer in her MGM and mat aunt, genetic testing not done. There is no FH of ovarian cancer. The patient does do self-breast exams.  Tobacco use: The patient denies current or previous tobacco use. Alcohol use: none Exercise: moderately active  She does get adequate calcium and Vitamin D in her diet.  She has her labs managed with PCP.  Past Medical History:  Diagnosis Date  . 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 Hyperlipidemia   . Hypertension   . IFG (impaired fasting glucose)   . Low back pain   . Migraines   . Morbid obesity ([place])     Past Surgical History:  Procedure Laterality Date  . NO PAST SURGERIES      Family History  Problem Relation Age of Onset  . Diabetes Mother   . Hypertension Mother   . Thyroid disease Mother   . Breast cancer Maternal Grandmother 25  . Diabetes Maternal Grandfather   . Hypertension Maternal Grandfather   . Seizures Maternal Grandfather   . Breast cancer Maternal Aunt 27  . Diabetes Sister   . Diabetes Paternal Grandmother   . Heart disease Neg Hx   . Stroke Neg Hx   . COPD Neg Hx     Social History   Social History  . Marital status: Single    Spouse name: N/A  . Number of children: N/A  . Years of education: N/A   Occupational History  . Not on file.   Social History Main Topics  . Smoking status: Never Smoker  . Smokeless tobacco: Never Used  . Alcohol use No  . Drug use: No  . Sexual activity: Not  Currently    Birth control/ protection: Implant   Other Topics Concern  . Not on file   Social History Narrative  . No narrative on file     Current Outpatient Prescriptions:  .  amLODipine (NORVASC) 5 MG tablet, Take 1 tablet (5 mg total) by mouth daily., Disp: 90 tablet, Rfl: 3 .  aspirin EC 81 MG tablet, Take 1 tablet (81 mg total) by mouth daily., Disp: , Rfl:  .  Blood Pressure Monitoring (BLOOD PRESSURE MONITOR/L CUFF) MISC, Large cuff; check BP; fluctuating blood pressures; HTN; LON 99 months, Disp: 1 each, Rfl: 0 .  Cholecalciferol (VITAMIN D) 2000 UNITS CAPS, Take 2,000 Units by mouth daily., Disp: , Rfl:  .  etonogestrel (NEXPLANON) 68 MG IMPL implant, 1 each by Subdermal route once., Disp: , Rfl:  .  fluticasone (FLONASE) 50 MCG/ACT nasal spray, Place 2 sprays into both nostrils daily., Disp: 16 g, Rfl: 6 .  hydrALAZINE (APRESOLINE) 50 MG tablet, Take 1 tablet (50 mg total) by mouth 3 (three) times daily., Disp: 270 tablet, Rfl: 1 .  hydrocortisone valerate cream (WESTCORT) 0.2 %, Apply 1 application topically as needed (itching). , Disp: , Rfl:  .  labetalol (NORMODYNE) 200 MG tablet, Take 1 tablet (  200 mg total) by mouth 3 (three) times daily., Disp: 270 tablet, Rfl: 1 .  Naltrexone-Bupropion HCl ER 8-90 MG TB12, One by mouth daily x 1 week, then 1 pill BID x 1 week, then 2 pills in AM and 1 pill in PM x 1 week, then 2 pills BID (Patient not taking: Reported on 10/13/2016), Disp: 78 tablet, Rfl: 0  ROS:  Review of Systems  Constitutional: Negative for fatigue, fever and unexpected weight change.  Respiratory: Negative for cough, shortness of breath and wheezing.   Cardiovascular: Negative for chest pain, palpitations and leg swelling.  Gastrointestinal: Negative for blood in stool, constipation, diarrhea, nausea and vomiting.  Endocrine: Negative for cold intolerance, heat intolerance and polyuria.  Genitourinary: Negative for dyspareunia, dysuria, flank pain, frequency,  genital sores, hematuria, menstrual problem, pelvic pain, urgency, vaginal bleeding, vaginal discharge and vaginal pain.  Musculoskeletal: Negative for back pain, joint swelling and myalgias.  Skin: Negative for rash.  Neurological: Negative for dizziness, syncope, light-headedness, numbness and headaches.  Hematological: Negative for adenopathy.  Psychiatric/Behavioral: Negative for agitation, confusion, sleep disturbance and suicidal ideas. The patient is not nervous/anxious.      Objective: BP (!) 140/100   Pulse 72   Ht 5\' 5"  (1.651 m)   Wt 276 lb (125.2 kg)   LMP 09/17/2016 (Exact Date)   BMI 45.93 kg/m    Physical Exam  Constitutional: She is oriented to person, place, and time. She appears well-developed and well-nourished.  Genitourinary: Vagina normal and uterus normal. There is no rash or tenderness on the right labia. There is no rash or tenderness on the left labia. No erythema or tenderness in the vagina. No vaginal discharge found. Right adnexum does not display mass and does not display tenderness. Left adnexum does not display mass and does not display tenderness. Cervix does not exhibit motion tenderness or polyp. Uterus is not enlarged or tender.  Neck: Normal range of motion. No thyromegaly present.  Cardiovascular: Normal rate, regular rhythm and normal heart sounds.   No murmur heard. Pulmonary/Chest: Effort normal and breath sounds normal. Right breast exhibits no mass, no nipple discharge, no skin change and no tenderness. Left breast exhibits no mass, no nipple discharge, no skin change and no tenderness.  Abdominal: Soft. There is no tenderness. There is no guarding.  Musculoskeletal: Normal range of motion.  Neurological: She is alert and oriented to person, place, and time. No cranial nerve deficit.  Psychiatric: She has a normal mood and affect. Her behavior is normal.  Vitals reviewed.   Assessment/Plan: Encounter for annual routine gynecological  examination  Cervical cancer screening - Plan: IGP, Aptima HPV  Screening for HPV (human papillomavirus) - Plan: IGP, Aptima HPV  Encounter for surveillance of implantable subdermal contraceptive - Doing well with nexplanon.  Screening for breast cancer - Pt to sched [facility]. - Plan: MM SCREENING BREAST TOMO BILATERAL  Family history of breast cancer - My Risk testing discussed and pt declines. Start yearly mammos based on FH. Cont Vit D. - Plan: MM SCREENING BREAST TOMO BILATERAL             GYN counsel breast self exam, mammography screening, adequate intake of calcium and vitamin D     F/U  Return in about 1 year (around 10/13/2017).  Esli Jernigan B. Damir Leung, PA-C 10/13/2016 8:37 AM

## 2016-10-17 LAB — IGP, APTIMA HPV
HPV APTIMA: NEGATIVE
PAP Smear Comment: 0

## 2016-10-20 ENCOUNTER — Encounter: Payer: Self-pay | Admitting: Family Medicine

## 2016-10-20 ENCOUNTER — Ambulatory Visit (INDEPENDENT_AMBULATORY_CARE_PROVIDER_SITE_OTHER): Payer: 59 | Admitting: Family Medicine

## 2016-10-20 DIAGNOSIS — I10 Essential (primary) hypertension: Secondary | ICD-10-CM

## 2016-10-20 MED ORDER — LABETALOL HCL 200 MG PO TABS: 400.0000 mg | ORAL_TABLET | Freq: Three times a day (TID) | ORAL | 0 refills | Status: DC

## 2016-10-20 NOTE — Assessment & Plan Note (Signed)
Tough to manage HTN; will refer to nephrologist for management; work on weight loss, recommended bariatric surgery

## 2016-10-20 NOTE — Progress Notes (Signed)
BP (!) 144/102   Pulse 98   Temp 98.5 F (36.9 C) (Oral)   Resp 16   Wt 274 lb 12.8 oz (124.6 kg)   SpO2 99%   BMI 45.73 kg/m    Subjective:    Patient ID: Mckenzie Lopez, female    DOB: [birth year], 31 y.o.   MRN: [id number]  HPI: Mckenzie Lopez is a 31 y.o. female  Chief Complaint  Patient presents with  . Follow-up    6 weeks    HPI She is here for follow-up of hypertension She went for her physical last Monday with gynecologist and it was up then She just started a dance class last week, free, for exercise; doing the walking Eating how she was eating On amlodipine 10 mg daily in the past, but had too much swelling She is now taking two of the 200 mg labetalol (400 mg per dose) three times a day Also taking hydralazine 50 mg three times a day She has been monitoring her BP at home; checks it every other day; 120/89, then 140/90, then 140/83, up and down Avoiding salt as much as possible She eats fast food just once in a blue moon; eats a salad and baked chicken; no fried foods Mother, aunt, cousin all have HTN She has a cousin who just had bariatric surgery; she is trying and trying and weight won't come off HTN really became uncontrolled after delivery; daughter is now 47 months old  Depression screen [facility] 2/9 08/24/2016 07/15/2016 03/16/2016 01/14/2016 12/31/2015  Decreased Interest 0 0 0 0 0  Down, Depressed, Hopeless 0 0 0 0 0  PHQ - 2 Score 0 0 0 0 0    Relevant past medical, surgical, family and social history reviewed Past Medical History:  Diagnosis Date  . 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 Hyperlipidemia   . Hypertension   . IFG (impaired fasting glucose)   . Low back pain   . Migraines   . Morbid obesity ([place])    Past Surgical History:  Procedure Laterality Date  . NO PAST SURGERIES     Family History  Problem Relation Age of Onset  . Diabetes Mother   . Hypertension Mother   . Thyroid disease Mother   . Breast cancer Maternal  Grandmother 25  . Diabetes Maternal Grandfather   . Hypertension Maternal Grandfather   . Seizures Maternal Grandfather   . Breast cancer Maternal Aunt 27  . Diabetes Sister   . Diabetes Paternal Grandmother   . Heart disease Neg Hx   . Stroke Neg Hx   . COPD Neg Hx    Social History   Social History  . Marital status: Single    Spouse name: N/A  . Number of children: N/A  . Years of education: N/A   Occupational History  . Not on file.   Social History Main Topics  . Smoking status: Never Smoker  . Smokeless tobacco: Never Used  . Alcohol use No  . Drug use: No  . Sexual activity: Not Currently    Birth control/ protection: Implant   Other Topics Concern  . Not on file   Social History Narrative  . No narrative on file    Interim medical history since last visit reviewed. Allergies and medications reviewed  Review of Systems Per HPI unless specifically indicated above     Objective:    BP (!) 144/102   Pulse 98   Temp 98.5 F (36.9 C) (  Oral)   Resp 16   Wt 274 lb 12.8 oz (124.6 kg)   SpO2 99%   BMI 45.73 kg/m   Wt Readings from Last 3 Encounters:  10/20/16 274 lb 12.8 oz (124.6 kg)  10/13/16 276 lb (125.2 kg)  09/07/16 274 lb 8 oz (124.5 kg)    Physical Exam  Constitutional: She appears well-developed and well-nourished.  HENT:  Mouth/Throat: Mucous membranes are normal.  Eyes: EOM are normal. No scleral icterus.  Cardiovascular: Normal rate and regular rhythm.   Pulmonary/Chest: Effort normal and breath sounds normal.  Abdominal:  Morbidly obese  Musculoskeletal: She exhibits no edema.  Psychiatric: She has a normal mood and affect. Her behavior is normal.       Assessment & Plan:   Problem List Items Addressed This Visit      Cardiovascular and Mediastinum   Hypertension (Chronic)    Tough to manage HTN; will refer to nephrologist for management; work on weight loss, recommended bariatric surgery      Relevant Medications    labetalol (NORMODYNE) 200 MG tablet   Other Relevant Orders   Ambulatory referral to Nephrology     Other   Morbid obesity ([place]) (Chronic)    encouraged weight loss; suggested bariatric surgery referral      Relevant Orders   Amb Referral to Bariatric Surgery      Follow up plan: Return in about 6 weeks (around 12/01/2016).  An after-visit summary was printed and given to the patient at [facility].  Please see the patient instructions which may contain other information and recommendations beyond what is mentioned above in the assessment and plan.  Meds ordered this encounter  Medications  . labetalol (NORMODYNE) 200 MG tablet    Sig: Take 2 tablets (400 mg total) by mouth 3 (three) times daily.    Dispense:  120 tablet    Refill:  0    Orders Placed This Encounter  Procedures  . Ambulatory referral to Nephrology  . Amb Referral to Bariatric Surgery

## 2016-10-20 NOTE — Patient Instructions (Addendum)
We'll have you see the hypertension specialist  Try to follow the DASH guidelines (DASH stands for Dietary Approaches to Stop Hypertension) Try to limit the sodium in your diet.  Ideally, consume less than 1.5 grams (less than 1,500mg ) per day. Do not add salt when cooking or at the table.  Check the sodium amount on labels when shopping, and choose items lower in sodium when given a choice. Avoid or limit foods that already contain a lot of sodium. Eat a diet rich in fruits and vegetables and whole grains.  We'll have you meet with the bariatric surgeon  Check out the information at familydoctor.org entitled "Nutrition for Weight Loss: What You Need to Know about Fad Diets" Try to lose between 1-2 pounds per week by taking in fewer calories and burning off more calories You can succeed by limiting portions, limiting foods dense in calories and fat, becoming more active, and drinking 8 glasses of water a day (64 ounces) Don't skip meals, especially breakfast, as skipping meals may alter your metabolism Do not use over-the-counter weight loss pills or gimmicks that claim rapid weight loss A healthy BMI (or body mass index) is between 18.5 and 24.9 You can calculate your ideal BMI at the [place] website ClubMonetize.fr  Bariatric Surgery Information Bariatric surgery, also called weight loss surgery, is a procedure that helps you lose weight. You may consider or your health care provider may suggest bariatric surgery if:  You are severely obese and have been unable to lose weight through diet and exercise.  You have health problems related to obesity, such as: ? Type 2 diabetes. ? Heart disease. ? Lung disease.  How does bariatric surgery help me lose weight? Bariatric surgery helps you lose weight by decreasing how much food your body absorbs. This is done by closing off part of your stomach to make it smaller. This restricts the amount of  food your stomach can hold. Bariatric surgery can also change your body's regular digestive process, so that food bypasses the parts of your body that absorb calories and nutrients. If you decide to have bariatric surgery, it is important to continue to eat a healthy diet and exercise regularly after the surgery. What are the different kinds of bariatric surgery? There are two kinds of bariatric surgeries:  Restrictive surgeries make your stomach smaller. They do not change your digestive process. The smaller the size of your new stomach, the less food you can eat. There are different types of restrictive surgeries.  Malabsorptive surgeries both make your stomach smaller and alter your digestive process so that your body processes less calories and nutrients. These are the most common kind of bariatric surgery. There are different types of malabsorptive surgeries.  What are the different types of restrictive surgery? Adjustable Gastric Banding In this procedure, an inflatable band is placed around your stomach near the upper end. This makes the passageway for food into the rest of your stomach much smaller. The band can be adjusted, making it tighter or looser, by filling it with salt solution. Your surgeon can adjust the band based on how are you feeling and how much weight you are losing. The band can be removed in the future. Vertical Banded Gastroplasty In this procedure, staples are used to separate your stomach into two parts, a small upper pouch and a bigger lower pouch. This decreases how much food you can eat. Sleeve Gastrectomy In this procedure, your stomach is made smaller. This is done by surgically removing a  large part of your stomach. When your stomach is smaller, you feel full more quickly and reduce how much you eat. What are the different types of malabsorptive surgery? Roux-en-Y Gastric Bypass (RGB) This is the most common weight loss surgery. In this procedure, a small stomach  pouch is created in the upper part of your stomach. Next, this small stomach pouch is attached directly to the middle part of your small intestine. The farther down your small intestine the new connection is made, the fewer calories and nutrients you will absorb. Biliopancreatic Diversion with Duodenal Switch (BPD/DS) This is a multi-step procedure. In this procedure, a large part of your stomach is removed, making your stomach smaller. Next, this smaller stomach is attached to the lower part of your small intestine. Like the RGB surgery, you absorb fewer calories and nutrients the farther down your small intestine the attachment is made. What are the risks of bariatric surgery? As with any surgical procedure, each type of bariatric surgery has its own risks. These risks also depend on your age, your overall health, and any other medical conditions you may have. When deciding on bariatric surgery, it is very important to:  Talk to your health care provider and choose the surgery that is best for you.  Ask your health care provider about specific risks for the surgery you choose.  Where to find more information:  American Society for Metabolic & Bariatric Surgery: www.asmbs.org  Weight-control Information Network (WIN): win.AmenCredit.is This information is not intended to replace advice given to you by your health care provider. Make sure you discuss any questions you have with your health care provider. Document Released: 04/27/2005 Document Revised: 10/03/2015 Document Reviewed: 10/26/2012 Elsevier Interactive Patient Education  2017 [place] DASH stands for "Dietary Approaches to Stop Hypertension." The DASH eating plan is a healthy eating plan that has been shown to reduce high blood pressure (hypertension). It may also reduce your risk for type 2 diabetes, heart disease, and stroke. The DASH eating plan may also help with weight loss. What are tips for following this  plan? General guidelines  Avoid eating more than 2,300 mg (milligrams) of salt (sodium) a day. If you have hypertension, you may need to reduce your sodium intake to 1,500 mg a day.  Limit alcohol intake to no more than 1 drink a day for nonpregnant women and 2 drinks a day for men. One drink equals 12 oz of beer, 5 oz of wine, or 1 oz of hard liquor.  Work with your health care provider to maintain a healthy body weight or to lose weight. Ask what an ideal weight is for you.  Get at least 30 minutes of exercise that causes your heart to beat faster (aerobic exercise) most days of the week. Activities may include walking, swimming, or biking.  Work with your health care provider or diet and nutrition specialist (dietitian) to adjust your eating plan to your individual calorie needs. Reading food labels  Check food labels for the amount of sodium per serving. Choose foods with less than 5 percent of the Daily Value of sodium. Generally, foods with less than 300 mg of sodium per serving fit into this eating plan.  To find whole grains, look for the word "whole" as the first word in the ingredient list. Shopping  Buy products labeled as "low-sodium" or "no salt added."  Buy fresh foods. Avoid canned foods and premade or frozen meals. Cooking  Avoid adding salt when  cooking. Use salt-free seasonings or herbs instead of table salt or sea salt. Check with your health care provider or pharmacist before using salt substitutes.  Do not fry foods. Cook foods using healthy methods such as baking, boiling, grilling, and broiling instead.  Cook with heart-healthy oils, such as olive, canola, soybean, or sunflower oil. Meal planning   Eat a balanced diet that includes: ? 5 or more servings of fruits and vegetables each day. At each meal, try to fill half of your plate with fruits and vegetables. ? Up to 6-8 servings of whole grains each day. ? Less than 6 oz of lean meat, poultry, or fish each  day. A 3-oz serving of meat is about the same size as a deck of cards. One egg equals 1 oz. ? 2 servings of low-fat dairy each day. ? A serving of nuts, seeds, or beans 5 times each week. ? Heart-healthy fats. Healthy fats called Omega-3 fatty acids are found in foods such as flaxseeds and coldwater fish, like sardines, salmon, and mackerel.  Limit how much you eat of the following: ? Canned or prepackaged foods. ? Food that is high in trans fat, such as fried foods. ? Food that is high in saturated fat, such as fatty meat. ? Sweets, desserts, sugary drinks, and other foods with added sugar. ? Full-fat dairy products.  Do not salt foods before eating.  Try to eat at least 2 vegetarian meals each week.  Eat more home-cooked food and less restaurant, buffet, and fast food.  When eating at a restaurant, ask that your food be prepared with less salt or no salt, if possible. What foods are recommended? The items listed may not be a complete list. Talk with your dietitian about what dietary choices are best for you. Grains Whole-grain or whole-wheat bread. Whole-grain or whole-wheat pasta. Brown rice. [place]. Bulgur. Whole-grain and low-sodium cereals. Pita bread. Low-fat, low-sodium crackers. Whole-wheat flour tortillas. Vegetables Fresh or frozen vegetables (raw, steamed, roasted, or grilled). Low-sodium or reduced-sodium tomato and vegetable juice. Low-sodium or reduced-sodium tomato sauce and tomato paste. Low-sodium or reduced-sodium canned vegetables. Fruits All fresh, dried, or frozen fruit. Canned fruit in natural juice (without added sugar). Meat and other protein foods Skinless chicken or [place]. Ground chicken or [place]. Pork with fat trimmed off. Fish and seafood. Egg whites. Dried beans, peas, or lentils. Unsalted nuts, nut butters, and seeds. Unsalted canned beans. Lean cuts of beef with fat trimmed off. Low-sodium, lean deli meat. Dairy Low-fat (1%) or fat-free (skim)  milk. Fat-free, low-fat, or reduced-fat cheeses. Nonfat, low-sodium ricotta or cottage cheese. Low-fat or nonfat yogurt. Low-fat, low-sodium cheese. Fats and oils Soft margarine without trans fats. Vegetable oil. Low-fat, reduced-fat, or light mayonnaise and salad dressings (reduced-sodium). Canola, safflower, olive, soybean, and sunflower oils. Avocado. Seasoning and other foods Herbs. Spices. Seasoning mixes without salt. Unsalted popcorn and pretzels. Fat-free sweets. What foods are not recommended? The items listed may not be a complete list. Talk with your dietitian about what dietary choices are best for you. Grains Baked goods made with fat, such as croissants, muffins, or some breads. Dry pasta or rice meal packs. Vegetables Creamed or fried vegetables. Vegetables in a cheese sauce. Regular canned vegetables (not low-sodium or reduced-sodium). Regular canned tomato sauce and paste (not low-sodium or reduced-sodium). Regular tomato and vegetable juice (not low-sodium or reduced-sodium). Angie Fava. Olives. Fruits Canned fruit in a light or heavy syrup. Fried fruit. Fruit in cream or butter sauce. Meat and other protein  foods Fatty cuts of meat. Ribs. Fried meat. Berniece Salines. Sausage. Bologna and other processed lunch meats. Salami. Fatback. Hotdogs. Bratwurst. Salted nuts and seeds. Canned beans with added salt. Canned or smoked fish. Whole eggs or egg yolks. Chicken or [place] with skin. Dairy Whole or 2% milk, cream, and half-and-half. Whole or full-fat cream cheese. Whole-fat or sweetened yogurt. Full-fat cheese. Nondairy creamers. Whipped toppings. Processed cheese and cheese spreads. Fats and oils Butter. Stick margarine. Lard. Shortening. Ghee. Bacon fat. Tropical oils, such as coconut, palm kernel, or palm oil. Seasoning and other foods Salted popcorn and pretzels. Onion salt, garlic salt, seasoned salt, table salt, and sea salt. Worcestershire sauce. Tartar sauce. Barbecue sauce. Teriyaki  sauce. Soy sauce, including reduced-sodium. Steak sauce. Canned and packaged gravies. Fish sauce. Oyster sauce. Cocktail sauce. Horseradish that you find on the shelf. Ketchup. Mustard. Meat flavorings and tenderizers. Bouillon cubes. Hot sauce and Tabasco sauce. Premade or packaged marinades. Premade or packaged taco seasonings. Relishes. Regular salad dressings. Where to find more information:  National Heart, Lung, and [place]: https://wilson-eaton.com/  American Heart Association: www.heart.org Summary  The DASH eating plan is a healthy eating plan that has been shown to reduce high blood pressure (hypertension). It may also reduce your risk for type 2 diabetes, heart disease, and stroke.  With the DASH eating plan, you should limit salt (sodium) intake to 2,300 mg a day. If you have hypertension, you may need to reduce your sodium intake to 1,500 mg a day.  When on the DASH eating plan, aim to eat more fresh fruits and vegetables, whole grains, lean proteins, low-fat dairy, and heart-healthy fats.  Work with your health care provider or diet and nutrition specialist (dietitian) to adjust your eating plan to your individual calorie needs. This information is not intended to replace advice given to you by your health care provider. Make sure you discuss any questions you have with your health care provider. Document Released: 04/16/2011 Document Revised: 04/20/2016 Document Reviewed: 04/20/2016 Elsevier Interactive Patient Education  2017 [employer].

## 2016-10-20 NOTE — Assessment & Plan Note (Signed)
encouraged weight loss; suggested bariatric surgery referral

## 2016-10-23 ENCOUNTER — Encounter: Payer: Self-pay | Admitting: Family Medicine

## 2016-12-01 ENCOUNTER — Encounter: Payer: Self-pay | Admitting: Family Medicine

## 2016-12-01 ENCOUNTER — Ambulatory Visit (INDEPENDENT_AMBULATORY_CARE_PROVIDER_SITE_OTHER): Payer: 59 | Admitting: Family Medicine

## 2016-12-01 DIAGNOSIS — I10 Essential (primary) hypertension: Secondary | ICD-10-CM

## 2016-12-01 DIAGNOSIS — R7301 Impaired fasting glucose: Secondary | ICD-10-CM | POA: Diagnosis not present

## 2016-12-01 DIAGNOSIS — E782 Mixed hyperlipidemia: Secondary | ICD-10-CM | POA: Diagnosis not present

## 2016-12-01 NOTE — Progress Notes (Signed)
BP 138/84   Pulse 91   Temp 98.1 F (36.7 C) (Oral)   Resp 16   Wt 285 lb 9.6 oz (129.5 kg)   LMP 09/08/2016   SpO2 97%   Breastfeeding? No   BMI 47.53 kg/m    Subjective:    Patient ID: Mckenzie Lopez, female    DOB: 1985/08/20, 31 y.o.   MRN: [id number]  HPI: Mckenzie Lopez is a 31 y.o. female  Chief Complaint  Patient presents with  . Follow-up    6 weeks     HPI Patient is here for follow-up Since her last visit, she had the Nexplanon removed (October 23, 2016); going to get tubes tied  She has hypertension; monitoring her BP at home 128/80, 134/83, 129/84, 137/82, and on and on; most numbers are averaging 956 systolic Not a salt person, not eating salty snacks; does not care for salt; does not cook with salt Not using decongestants, or black licorice Stress level is pretty good She is working out with co-worker, walking on the track, 3 laps is a mile, doing that every day  She has morbid obesity and has actually gained weight since her last visit She feels better, drinking water, feels better She is trying to eat healthier; I offered nutritionist again but she said there was something with her insurance; she thinks the weight problem is because she works 3rd shift, because other people are struggling too; she does not eat many vegetables, but she's trying to do better Cousin did the surgery and she did good; she would entertain seeing a Psychologist, sport and exercise; she never heard from the last referral in June  Labs done in [place] all reviewed, normal thyroid, normal A1c  Depression screen [facility] 2/9 12/01/2016 08/24/2016 07/15/2016 03/16/2016 01/14/2016  Decreased Interest 0 0 0 0 0  Down, Depressed, Hopeless 0 0 0 0 0  PHQ - 2 Score 0 0 0 0 0    Relevant past medical, surgical, family and social history reviewed Past Medical History:  Diagnosis Date  . 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 Hyperlipidemia   . Hypertension   . IFG (impaired fasting glucose)   . Low back  pain   . Migraines   . Morbid obesity ([place])    Past Surgical History:  Procedure Laterality Date  . NO PAST SURGERIES     Family History  Problem Relation Age of Onset  . Diabetes Mother   . Hypertension Mother   . Thyroid disease Mother   . Breast cancer Maternal Grandmother 25  . Diabetes Maternal Grandfather   . Hypertension Maternal Grandfather   . Seizures Maternal Grandfather   . Breast cancer Maternal Aunt 27  . Diabetes Sister   . Diabetes Paternal Grandmother   . Heart disease Neg Hx   . Stroke Neg Hx   . COPD Neg Hx    Social History   Social History  . Marital status: Single    Spouse name: N/A  . Number of children: N/A  . Years of education: N/A   Occupational History  . Not on file.   Social History Main Topics  . Smoking status: Never Smoker  . Smokeless tobacco: Never Used  . Alcohol use No  . Drug use: No  . Sexual activity: Not Currently    Birth control/ protection: Implant   Other Topics Concern  . Not on file   Social History Narrative  . No narrative on file    Interim medical  history since last visit reviewed. Allergies and medications reviewed  Review of Systems Per HPI unless specifically indicated above     Objective:    BP 138/84   Pulse 91   Temp 98.1 F (36.7 C) (Oral)   Resp 16   Wt 285 lb 9.6 oz (129.5 kg)   LMP 09/08/2016   SpO2 97%   Breastfeeding? No   BMI 47.53 kg/m   Wt Readings from Last 3 Encounters:  12/01/16 285 lb 9.6 oz (129.5 kg)  10/20/16 274 lb 12.8 oz (124.6 kg)  10/13/16 276 lb (125.2 kg)    Physical Exam  Constitutional: She appears well-developed and well-nourished.  Weight gain nearly 11 pounds over the last 6 weeks  HENT:  Mouth/Throat: Mucous membranes are normal.  Eyes: EOM are normal. No scleral icterus.  Cardiovascular: Normal rate and regular rhythm.   Pulmonary/Chest: Effort normal and breath sounds normal.  Abdominal:  Morbidly obese  Musculoskeletal: She exhibits no edema.    Psychiatric: She has a normal mood and affect. Her behavior is normal.    Results for orders placed or performed in visit on 10/13/16  IGP, Aptima HPV  Result Value Ref Range   DIAGNOSIS: Comment    Specimen adequacy: Comment    Clinician Provided ICD10 Comment    Performed by: Comment    QC reviewed by: Comment    PAP Smear Comment .    Note: Comment    Test Methodology Comment    HPV Aptima Negative Negative      Assessment & Plan:   Problem List Items Addressed This Visit      Cardiovascular and Mediastinum   Hypertension (Chronic)    Better control with Nexplanon out; patient to monitor her BP; try DASH guidelines; weight loss so important; avoid decongestants        Endocrine   IFG (impaired fasting glucose)    Discussed that carrying the extra weight puts her at risk for developing diabetes in the future; she has acanthosis nigricans; she'll see bariatric surgeon        Other   Morbid obesity ([place]) (Chronic)    Offered medicine for weight loss, offered nutrition referral; stress importance of weight loss; can lead to problems; offered bariatric surgery referral      Relevant Orders   Amb Referral to Bariatric Surgery   Hyperlipidemia    Weight loss important, as well as healthier eating; glad she is walking daily at work          Follow up plan: Return in about 3 months (around 03/11/2017) for twenty minute follow-up with fasting labs.  An after-visit summary was printed and given to the patient at [facility].  Please see the patient instructions which may contain other information and recommendations beyond what is mentioned above in the assessment and plan.  No orders of the defined types were placed in this encounter.   Orders Placed This Encounter  Procedures  . Amb Referral to Bariatric Surgery

## 2016-12-01 NOTE — Patient Instructions (Addendum)
Check out the information at [email or web address] entitled "Nutrition for Weight Loss: What You Need to Know about Fad Diets" Try to lose between 1-2 pounds per week by taking in fewer calories and burning off more calories You can succeed by limiting portions, limiting foods dense in calories and fat, becoming more active, and drinking 8 glasses of water a day (64 ounces) Don't skip meals, especially breakfast, as skipping meals may alter your metabolism Do not use over-the-counter weight loss pills or gimmicks that claim rapid weight loss A healthy BMI (or body mass index) is between 18.5 and 24.9 You can calculate your ideal BMI at the [place] website ClubMonetize.fr We'll have you see the bariatric specialist about your weight Your goal blood pressure is less than 140 mmHg on top. Try to follow the DASH guidelines (DASH stands for Dietary Approaches to Stop Hypertension) Try to limit the sodium in your diet.  Ideally, consume less than 1.5 grams (less than 1,500mg ) per day. Do not add salt when cooking or at the table.  Check the sodium amount on labels when shopping, and choose items lower in sodium when given a choice. Avoid or limit foods that already contain a lot of sodium. Eat a diet rich in fruits and vegetables and whole grains.   DASH Eating Plan DASH stands for "Dietary Approaches to Stop Hypertension." The DASH eating plan is a healthy eating plan that has been shown to reduce high blood pressure (hypertension). It may also reduce your risk for type 2 diabetes, heart disease, and stroke. The DASH eating plan may also help with weight loss. What are tips for following this plan? General guidelines  Avoid eating more than 2,300 mg (milligrams) of salt (sodium) a day. If you have hypertension, you may need to reduce your sodium intake to 1,500 mg a day.  Limit alcohol intake to no more than 1 drink a day for nonpregnant women and 2 drinks a  day for men. One drink equals 12 oz of beer, 5 oz of wine, or 1 oz of hard liquor.  Work with your health care provider to maintain a healthy body weight or to lose weight. Ask what an ideal weight is for you.  Get at least 30 minutes of exercise that causes your heart to beat faster (aerobic exercise) most days of the week. Activities may include walking, swimming, or biking.  Work with your health care provider or diet and nutrition specialist (dietitian) to adjust your eating plan to your individual calorie needs. Reading food labels  Check food labels for the amount of sodium per serving. Choose foods with less than 5 percent of the Daily Value of sodium. Generally, foods with less than 300 mg of sodium per serving fit into this eating plan.  To find whole grains, look for the word "whole" as the first word in the ingredient list. Shopping  Buy products labeled as "low-sodium" or "no salt added."  Buy fresh foods. Avoid canned foods and premade or frozen meals. Cooking  Avoid adding salt when cooking. Use salt-free seasonings or herbs instead of table salt or sea salt. Check with your health care provider or pharmacist before using salt substitutes.  Do not fry foods. Cook foods using healthy methods such as baking, boiling, grilling, and broiling instead.  Cook with heart-healthy oils, such as olive, canola, soybean, or sunflower oil. Meal planning   Eat a balanced diet that includes: ? 5 or more servings of fruits and vegetables each day. At each meal,  try to fill half of your plate with fruits and vegetables. ? Up to 6-8 servings of whole grains each day. ? Less than 6 oz of lean meat, poultry, or fish each day. A 3-oz serving of meat is about the same size as a deck of cards. One egg equals 1 oz. ? 2 servings of low-fat dairy each day. ? A serving of nuts, seeds, or beans 5 times each week. ? Heart-healthy fats. Healthy fats called Omega-3 fatty acids are found in foods such  as flaxseeds and coldwater fish, like sardines, salmon, and mackerel.  Limit how much you eat of the following: ? Canned or prepackaged foods. ? Food that is high in trans fat, such as fried foods. ? Food that is high in saturated fat, such as fatty meat. ? Sweets, desserts, sugary drinks, and other foods with added sugar. ? Full-fat dairy products.  Do not salt foods before eating.  Try to eat at least 2 vegetarian meals each week.  Eat more home-cooked food and less restaurant, buffet, and fast food.  When eating at a restaurant, ask that your food be prepared with less salt or no salt, if possible. What foods are recommended? The items listed may not be a complete list. Talk with your dietitian about what dietary choices are best for you. Grains Whole-grain or whole-wheat bread. Whole-grain or whole-wheat pasta. Brown rice. [place]. Bulgur. Whole-grain and low-sodium cereals. Pita bread. Low-fat, low-sodium crackers. Whole-wheat flour tortillas. Vegetables Fresh or frozen vegetables (raw, steamed, roasted, or grilled). Low-sodium or reduced-sodium tomato and vegetable juice. Low-sodium or reduced-sodium tomato sauce and tomato paste. Low-sodium or reduced-sodium canned vegetables. Fruits All fresh, dried, or frozen fruit. Canned fruit in natural juice (without added sugar). Meat and other protein foods Skinless chicken or [place]. Ground chicken or [place]. Pork with fat trimmed off. Fish and seafood. Egg whites. Dried beans, peas, or lentils. Unsalted nuts, nut butters, and seeds. Unsalted canned beans. Lean cuts of beef with fat trimmed off. Low-sodium, lean deli meat. Dairy Low-fat (1%) or fat-free (skim) milk. Fat-free, low-fat, or reduced-fat cheeses. Nonfat, low-sodium ricotta or cottage cheese. Low-fat or nonfat yogurt. Low-fat, low-sodium cheese. Fats and oils Soft margarine without trans fats. Vegetable oil. Low-fat, reduced-fat, or light mayonnaise and salad dressings  (reduced-sodium). Canola, safflower, olive, soybean, and sunflower oils. Avocado. Seasoning and other foods Herbs. Spices. Seasoning mixes without salt. Unsalted popcorn and pretzels. Fat-free sweets. What foods are not recommended? The items listed may not be a complete list. Talk with your dietitian about what dietary choices are best for you. Grains Baked goods made with fat, such as croissants, muffins, or some breads. Dry pasta or rice meal packs. Vegetables Creamed or fried vegetables. Vegetables in a cheese sauce. Regular canned vegetables (not low-sodium or reduced-sodium). Regular canned tomato sauce and paste (not low-sodium or reduced-sodium). Regular tomato and vegetable juice (not low-sodium or reduced-sodium). Angie Fava. Olives. Fruits Canned fruit in a light or heavy syrup. Fried fruit. Fruit in cream or butter sauce. Meat and other protein foods Fatty cuts of meat. Ribs. Fried meat. Berniece Salines. Sausage. Bologna and other processed lunch meats. Salami. Fatback. Hotdogs. Bratwurst. Salted nuts and seeds. Canned beans with added salt. Canned or smoked fish. Whole eggs or egg yolks. Chicken or [place] with skin. Dairy Whole or 2% milk, cream, and half-and-half. Whole or full-fat cream cheese. Whole-fat or sweetened yogurt. Full-fat cheese. Nondairy creamers. Whipped toppings. Processed cheese and cheese spreads. Fats and oils Butter. Stick margarine. Lard. Shortening. Ghee.  Bacon fat. Tropical oils, such as coconut, palm kernel, or palm oil. Seasoning and other foods Salted popcorn and pretzels. Onion salt, garlic salt, seasoned salt, table salt, and sea salt. Worcestershire sauce. Tartar sauce. Barbecue sauce. Teriyaki sauce. Soy sauce, including reduced-sodium. Steak sauce. Canned and packaged gravies. Fish sauce. Oyster sauce. Cocktail sauce. Horseradish that you find on the shelf. Ketchup. Mustard. Meat flavorings and tenderizers. Bouillon cubes. Hot sauce and Tabasco sauce. Premade or  packaged marinades. Premade or packaged taco seasonings. Relishes. Regular salad dressings. Where to find more information:  National Heart, Lung, and [place]: https://wilson-eaton.com/  American Heart Association: www.heart.org Summary  The DASH eating plan is a healthy eating plan that has been shown to reduce high blood pressure (hypertension). It may also reduce your risk for type 2 diabetes, heart disease, and stroke.  With the DASH eating plan, you should limit salt (sodium) intake to 2,300 mg a day. If you have hypertension, you may need to reduce your sodium intake to 1,500 mg a day.  When on the DASH eating plan, aim to eat more fresh fruits and vegetables, whole grains, lean proteins, low-fat dairy, and heart-healthy fats.  Work with your health care provider or diet and nutrition specialist (dietitian) to adjust your eating plan to your individual calorie needs. This information is not intended to replace advice given to you by your health care provider. Make sure you discuss any questions you have with your health care provider. Document Released: 04/16/2011 Document Revised: 04/20/2016 Document Reviewed: 04/20/2016 Elsevier Interactive Patient Education  2017 [employer].

## 2016-12-01 NOTE — Assessment & Plan Note (Signed)
Weight loss important, as well as healthier eating; glad she is walking daily at work

## 2016-12-01 NOTE — Assessment & Plan Note (Signed)
Offered medicine for weight loss, offered nutrition referral; stress importance of weight loss; can lead to problems; offered bariatric surgery referral

## 2016-12-01 NOTE — Assessment & Plan Note (Signed)
Discussed that carrying the extra weight puts her at risk for developing diabetes in the future; she has acanthosis nigricans; she'll see bariatric surgeon

## 2016-12-01 NOTE — Assessment & Plan Note (Signed)
Better control with Nexplanon out; patient to monitor her BP; try DASH guidelines; weight loss so important; avoid decongestants

## 2016-12-02 MED ORDER — LABETALOL HCL 200 MG PO TABS: 400.0000 mg | ORAL_TABLET | Freq: Three times a day (TID) | ORAL | 0 refills | Status: DC

## 2016-12-10 ENCOUNTER — Other Ambulatory Visit: Payer: Self-pay | Admitting: Nephrology

## 2016-12-10 DIAGNOSIS — I10 Essential (primary) hypertension: Secondary | ICD-10-CM

## 2016-12-14 ENCOUNTER — Ambulatory Visit: Payer: 59

## 2016-12-15 ENCOUNTER — Ambulatory Visit
Admission: RE | Admit: 2016-12-15 | Discharge: 2016-12-15 | Disposition: A | Discharge status: Home / Self Care | Payer: 59 | Source: Ambulatory Visit | Attending: Nephrology | Admitting: Nephrology

## 2016-12-15 DIAGNOSIS — I10 Essential (primary) hypertension: Secondary | ICD-10-CM | POA: Insufficient documentation

## 2016-12-25 ENCOUNTER — Encounter: Payer: Self-pay | Admitting: Family Medicine

## 2017-01-28 ENCOUNTER — Ambulatory Visit (INDEPENDENT_AMBULATORY_CARE_PROVIDER_SITE_OTHER): Payer: 59 | Admitting: Family Medicine

## 2017-01-28 ENCOUNTER — Encounter: Payer: Self-pay | Admitting: Family Medicine

## 2017-01-28 VITALS — BP 138/96 | HR 96 | Temp 98.3°F | Resp 14 | Ht 65.0 in | Wt 286.4 lb

## 2017-01-28 DIAGNOSIS — Z6841 Body Mass Index (BMI) 40.0 and over, adult: Secondary | ICD-10-CM | POA: Diagnosis not present

## 2017-01-28 DIAGNOSIS — E782 Mixed hyperlipidemia: Secondary | ICD-10-CM | POA: Diagnosis not present

## 2017-01-28 DIAGNOSIS — Z5181 Encounter for therapeutic drug level monitoring: Secondary | ICD-10-CM

## 2017-01-28 DIAGNOSIS — I10 Essential (primary) hypertension: Secondary | ICD-10-CM

## 2017-01-28 DIAGNOSIS — E559 Vitamin D deficiency, unspecified: Secondary | ICD-10-CM

## 2017-01-28 DIAGNOSIS — Z23 Encounter for immunization: Secondary | ICD-10-CM | POA: Diagnosis not present

## 2017-01-28 DIAGNOSIS — R7301 Impaired fasting glucose: Secondary | ICD-10-CM

## 2017-01-28 DIAGNOSIS — N870 Mild cervical dysplasia: Secondary | ICD-10-CM | POA: Diagnosis not present

## 2017-01-28 NOTE — Assessment & Plan Note (Signed)
Try to lose weight, eat fewer satruated fats

## 2017-01-28 NOTE — Assessment & Plan Note (Signed)
Last A1c was actually normal less than 6 months ago; will check glucose as part of BMP; advised weight loss

## 2017-01-28 NOTE — Assessment & Plan Note (Signed)
With GYN

## 2017-01-28 NOTE — Assessment & Plan Note (Signed)
Encouraged ongoing weight loss, walking, limiting portions, adequate water intake

## 2017-01-28 NOTE — Progress Notes (Signed)
BP (!) 138/96 (BP Location: Left Arm, Cuff Size: Normal)   Pulse 96   Temp 98.3 F (36.8 C) (Oral)   Resp 14   Ht 5\' 5"  (1.651 m)   Wt 286 lb 6.4 oz (129.9 kg)   LMP 01/10/2017   SpO2 98%   BMI 47.66 kg/m    Subjective:    Patient ID: Mckenzie Lopez, female    DOB: 08-Jan-1986, 31 y.o.   MRN: [id number]  HPI: Mckenzie Lopez is a 31 y.o. female  Chief Complaint  Patient presents with  . Follow-up    labs and 6 month f/u    HPI Patient is here for a follow-up visit, and came fasting for labs She has high blood pressure; recheck 138/96 here; it has been running well at home; 321 systolic in the afternoon and 130 in the mornings; checking at home every other day; she saw the nephrologist and everything checked out fine she says; her BP was 130/80 when she sees them; she had the [place]; less salt, does not care for it; does not add extra salt, whatever is already in the food Morbid obesity; she has been walking; it feels like she is losing in her clothes, but when she gets on the scale, the number is stuck; feels like she is losing inches; she gets discouraged, doesn't want to look at the scale; can feel the difference; thinks she is losing fat, losing inches, feels different in her clothes; drinking plenty of water, increased her water intake Hx of vit D deficiency; last check more than a year; some days she has some fatigue Prediabetes Lab Results  Component Value Date   HGBA1C 4.8 09/07/2016  Hx of abnormal pap smear; sees GYN; had her IUD removed; GYN told her some weight gain was from implant; last pap in June High cholesterol; last lipids less than 6 months ago Lab Results  Component Value Date   CHOL 176 09/07/2016   CHOL 192 02/25/2015   Lab Results  Component Value Date   HDL 43 (L) 09/07/2016   HDL 46 02/25/2015   Lab Results  Component Value Date   LDLCALC 119 (H) 09/07/2016   LDLCALC 129 (H) 02/25/2015   Lab Results  Component Value Date   TRIG  70 09/07/2016   TRIG 85 02/25/2015   Lab Results  Component Value Date   CHOLHDL 4.1 09/07/2016   No results found for: LDLDIRECT    Depression screen [facility] 2/9 01/28/2017 12/01/2016 08/24/2016 07/15/2016 03/16/2016  Decreased Interest 0 0 0 0 0  Down, Depressed, Hopeless 0 0 0 0 0  PHQ - 2 Score 0 0 0 0 0    Relevant past medical, surgical, family and social history reviewed Past Medical History:  Diagnosis Date  . 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 Hyperlipidemia   . Hypertension   . IFG (impaired fasting glucose)   . Low back pain   . Migraines   . Morbid obesity ([place])    Past Surgical History:  Procedure Laterality Date  . NO PAST SURGERIES     Family History  Problem Relation Age of Onset  . Diabetes Mother   . Hypertension Mother   . Thyroid disease Mother   . Breast cancer Maternal Grandmother 25  . Diabetes Maternal Grandfather   . Hypertension Maternal Grandfather   . Seizures Maternal Grandfather   . Breast cancer Maternal Aunt 27  . Diabetes Sister   . Diabetes Paternal Grandmother   .  Heart disease Neg Hx   . Stroke Neg Hx   . COPD Neg Hx    Social History   Social History  . Marital status: Single    Spouse name: N/A  . Number of children: N/A  . Years of education: N/A   Occupational History  . Not on file.   Social History Main Topics  . Smoking status: Never Smoker  . Smokeless tobacco: Never Used  . Alcohol use No  . Drug use: No  . Sexual activity: Not Currently    Birth control/ protection: Implant   Other Topics Concern  . Not on file   Social History Narrative  . No narrative on file    Interim medical history since last visit reviewed. Allergies and medications reviewed  Review of Systems  Constitutional: Negative for unexpected weight change.  Cardiovascular: Negative for chest pain and leg swelling.   Per HPI unless specifically indicated above     Objective:    BP (!) 138/96 (BP Location: Left Arm, Cuff Size:  Normal)   Pulse 96   Temp 98.3 F (36.8 C) (Oral)   Resp 14   Ht 5\' 5"  (1.651 m)   Wt 286 lb 6.4 oz (129.9 kg)   LMP 01/10/2017   SpO2 98%   BMI 47.66 kg/m   Wt Readings from Last 3 Encounters:  01/28/17 286 lb 6.4 oz (129.9 kg)  12/01/16 285 lb 9.6 oz (129.5 kg)  10/20/16 274 lb 12.8 oz (124.6 kg)    Physical Exam  Constitutional: She appears well-developed and well-nourished. No distress.  HENT:  Head: Normocephalic and atraumatic.  Eyes: EOM are normal. No scleral icterus.  Neck: No thyromegaly present.  Cardiovascular: Normal rate, regular rhythm and normal heart sounds.   No murmur heard. Pulmonary/Chest: Effort normal and breath sounds normal.  Abdominal: She exhibits no distension.  Musculoskeletal: She exhibits no edema.  Neurological: She is alert. She exhibits normal muscle tone.  Skin: Skin is warm and dry. She is not diaphoretic. No pallor.  Psychiatric: She has a normal mood and affect. Her behavior is normal. Judgment and thought content normal.    Results for orders placed or performed in visit on 10/13/16  IGP, Aptima HPV  Result Value Ref Range   DIAGNOSIS: Comment    Specimen adequacy: Comment    Clinician Provided ICD10 Comment    Performed by: Comment    QC reviewed by: Comment    PAP Smear Comment .    Note: Comment    Test Methodology Comment    HPV Aptima Negative Negative      Assessment & Plan:   Problem List Items Addressed This Visit      Cardiovascular and Mediastinum   Hypertension - Primary (Chronic)    Still not to goal at office, but reports good numbers at home; likely some white coat hypertension now; discussed importance of weight loss; DASH guidelines        Endocrine   IFG (impaired fasting glucose)    Last A1c was actually normal less than 6 months ago; will check glucose as part of BMP; advised weight loss        Genitourinary   Dysplasia of cervix, low grade (CIN 1)    With GYN        Other   Vitamin D  deficiency    Check vit D level in October      Relevant Orders   VITAMIN D 25 Hydroxy (Vit-D Deficiency, Fractures)  Morbid obesity (HCC) (Chronic)    Encouraged ongoing weight loss, walking, limiting portions, adequate water intake      Medication monitoring encounter    Check renal function      Relevant Orders   COMPLETE METABOLIC PANEL WITH GFR   Hyperlipidemia    Try to lose weight, eat fewer satruated fats      Relevant Orders   Lipid panel    Other Visit Diagnoses    Needs flu shot       Relevant Orders   Flu Vaccine QUAD 6+ mos PF IM (Fluarix Quad PF) (Completed)       Follow up plan: Return in about 6 months (around 07/28/2017) for follow-up visit with Dr. Sanda Klein.  An after-visit summary was printed and given to the patient at [place].  Please see the patient instructions which may contain other information and recommendations beyond what is mentioned above in the assessment and plan.  No orders of the defined types were placed in this encounter.   Orders Placed This Encounter  Procedures  . Flu Vaccine QUAD 6+ mos PF IM (Fluarix Quad PF)  . VITAMIN D 25 Hydroxy (Vit-D Deficiency, Fractures)  . Lipid panel  . COMPLETE METABOLIC PANEL WITH GFR

## 2017-01-28 NOTE — Assessment & Plan Note (Addendum)
Still not to goal at office, but reports good numbers at home; likely some white coat hypertension now; discussed importance of weight loss; DASH guidelines

## 2017-01-28 NOTE — Patient Instructions (Addendum)
Please return fasting for labs on October 30th or just after Check out the information at [email or web address] entitled "Nutrition for Weight Loss: What You Need to Know about Fad Diets" Try to lose between 1-2 pounds per week by taking in fewer calories and burning off more calories You can succeed by limiting portions, limiting foods dense in calories and fat, becoming more active, and drinking 8 glasses of water a day (64 ounces) Don't skip meals, especially breakfast, as skipping meals may alter your metabolism Do not use over-the-counter weight loss pills or gimmicks that claim rapid weight loss A healthy BMI (or body mass index) is between 18.5 and 24.9 You can calculate your ideal BMI at the [place] website ClubMonetize.fr

## 2017-01-28 NOTE — Assessment & Plan Note (Signed)
Check renal function. 

## 2017-01-28 NOTE — Assessment & Plan Note (Signed)
Check vit D level in October

## 2017-01-31 ENCOUNTER — Other Ambulatory Visit: Payer: Self-pay | Admitting: Family Medicine

## 2017-02-05 ENCOUNTER — Other Ambulatory Visit: Payer: Self-pay | Admitting: Family Medicine

## 2017-03-09 ENCOUNTER — Other Ambulatory Visit: Payer: Self-pay

## 2017-03-09 DIAGNOSIS — E782 Mixed hyperlipidemia: Secondary | ICD-10-CM

## 2017-03-09 DIAGNOSIS — Z5181 Encounter for therapeutic drug level monitoring: Secondary | ICD-10-CM

## 2017-03-09 DIAGNOSIS — E559 Vitamin D deficiency, unspecified: Secondary | ICD-10-CM

## 2017-03-10 LAB — COMPLETE METABOLIC PANEL WITH GFR
AG RATIO: 1.6 (calc) (ref 1.0–2.5)
ALKALINE PHOSPHATASE (APISO): 40 U/L (ref 33–115)
ALT: 11 U/L (ref 6–29)
AST: 12 U/L (ref 10–30)
Albumin: 4.2 g/dL (ref 3.6–5.1)
BILIRUBIN TOTAL: 0.3 mg/dL (ref 0.2–1.2)
BUN: 12 mg/dL (ref 7–25)
CO2: 22 mmol/L (ref 20–32)
CREATININE: 0.7 mg/dL (ref 0.50–1.10)
Calcium: 9 mg/dL (ref 8.6–10.2)
Chloride: 107 mmol/L (ref 98–110)
GFR, EST NON AFRICAN AMERICAN: 114 mL/min/{1.73_m2} (ref >=60)
GFR, Est African American: 132 mL/min/{1.73_m2} (ref >=60)
GLOBULIN: 2.6 g/dL (ref 1.9–3.7)
Glucose, Bld: 81 mg/dL (ref 65–99)
Potassium: 4 mmol/L (ref 3.5–5.3)
Sodium: 138 mmol/L (ref 135–146)
Total Protein: 6.8 g/dL (ref 6.1–8.1)

## 2017-03-10 LAB — LIPID PANEL
CHOL/HDL RATIO: 3.5 (calc) (ref <5.0)
CHOLESTEROL: 159 mg/dL (ref <200)
HDL: 46 mg/dL — AB (ref >50)
LDL CHOLESTEROL (CALC): 98 mg/dL
NON-HDL CHOLESTEROL (CALC): 113 mg/dL (ref <130)
TRIGLYCERIDES: 66 mg/dL (ref <150)

## 2017-03-10 LAB — VITAMIN D 25 HYDROXY (VIT D DEFICIENCY, FRACTURES): Vit D, 25-Hydroxy: 30 ng/mL (ref 30–100)

## 2017-05-11 DIAGNOSIS — Z1371 Encounter for nonprocreative screening for genetic disease carrier status: Secondary | ICD-10-CM

## 2017-05-11 HISTORY — DX: Encounter for nonprocreative screening for genetic disease carrier status: Z13.71

## 2017-06-27 MED ORDER — BLOOD PRESSURE MONITOR/L CUFF MISC: 0 refills | Status: DC

## 2017-06-27 NOTE — Telephone Encounter (Signed)
Found old note; signing off

## 2017-06-27 NOTE — Progress Notes (Signed)
Closing out scanned document note open since  Aug 2018

## 2017-06-29 NOTE — Progress Notes (Signed)
Closing out lab/order note open since:  10/18

## 2017-07-03 ENCOUNTER — Other Ambulatory Visit: Payer: Self-pay | Admitting: Family Medicine

## 2017-07-28 ENCOUNTER — Encounter: Payer: Self-pay | Admitting: Family Medicine

## 2017-07-28 ENCOUNTER — Ambulatory Visit: Payer: 59 | Admitting: Family Medicine

## 2017-07-28 DIAGNOSIS — R7301 Impaired fasting glucose: Secondary | ICD-10-CM

## 2017-07-28 DIAGNOSIS — I10 Essential (primary) hypertension: Secondary | ICD-10-CM

## 2017-07-28 MED ORDER — LABETALOL HCL 200 MG PO TABS: ORAL_TABLET | ORAL | 1 refills | Status: DC

## 2017-07-28 MED ORDER — LIRAGLUTIDE -WEIGHT MANAGEMENT 18 MG/3ML ~~LOC~~ SOPN: 0.6000 mg | PEN_INJECTOR | Freq: Every day | SUBCUTANEOUS | 0 refills | Status: DC

## 2017-07-28 MED ORDER — PEN NEEDLES 31G X 8 MM MISC: 2 refills | Status: DC

## 2017-07-28 NOTE — Patient Instructions (Signed)
Check out the information at [email or web address] entitled "Nutrition for Weight Loss: What You Need to Know about Fad Diets" Try to lose between 1-2 pounds per week by taking in fewer calories and burning off more calories You can succeed by limiting portions, limiting foods dense in calories and fat, becoming more active, and drinking 8 glasses of water a day (64 ounces) Don't skip meals, especially breakfast, as skipping meals may alter your metabolism Do not use over-the-counter weight loss pills or gimmicks that claim rapid weight loss A healthy BMI (or body mass index) is between 18.5 and 24.9 You can calculate your ideal BMI at the [place] website ClubMonetize.fr Start the [place], inject daily and taper up over the next month

## 2017-07-28 NOTE — Assessment & Plan Note (Signed)
Patient sounds motivated to lose weight; declined referral to nutritionist; has seen bariatric specialist; does not want surgery; will start Saxenda (no hx MTC or MEN-2); return in 6 weeks; use MyFitnessPal to get an idea of calorie intake and drop it by 500 kcal per day; adequate water, education / counseling / encouragement provided

## 2017-07-28 NOTE — Progress Notes (Signed)
BP 116/86   Pulse 89   Temp 98.4 F (36.9 C) (Oral)   Resp 16   Wt 295 lb 11.2 oz (134.1 kg)   SpO2 98%   BMI 49.21 kg/m    Subjective:    Patient ID: Mckenzie Lopez, female    DOB: 1986-01-16, 32 y.o.   MRN: [id number]  HPI: Mckenzie Lopez is a 32 y.o. female  Chief Complaint  Patient presents with  . Follow-up  . Obesity    wants/trying to loss weight    HPI Patient is here for f/u; no medical excitement since last visit She believes in the flu shot now  HTN; doing well; she has been able to cut back on her labetalol to just one pill TID; really trying to watch her diet  She is interested in weight loss; she has morbid obesity; she went to the bariatric clinic; just needs help; she does not want to do the actual surgery; she would like to lose a healthier way; they are going to move the office closer to [place], because there is more than just surgery We tried her on one pill, but it made her real jittery; tried the Contrave No hx of thyroid cancer, no MEN-2 in the family [place] Pro foods just once a week She does get plenty of water Going to join gym  Prediabetes; last A1c was actually completely normal Lab Results  Component Value Date   HGBA1C 4.8 09/07/2016     Depression screen [facility] 2/9 07/28/2017 01/28/2017 12/01/2016 08/24/2016 07/15/2016  Decreased Interest 0 0 0 0 0  Down, Depressed, Hopeless 0 0 0 0 0  PHQ - 2 Score 0 0 0 0 0    Relevant past medical, surgical, family and social history reviewed Past Medical History:  Diagnosis Date  . 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 Hyperlipidemia   . Hypertension   . IFG (impaired fasting glucose)   . Low back pain   . Migraines   . Morbid obesity ([place])    Past Surgical History:  Procedure Laterality Date  . NO PAST SURGERIES     Family History  Problem Relation Age of Onset  . Diabetes Mother   . Hypertension Mother   . Thyroid disease Mother   . Breast cancer Maternal Grandmother 25    . Diabetes Maternal Grandfather   . Hypertension Maternal Grandfather   . Seizures Maternal Grandfather   . Breast cancer Maternal Aunt 27  . Diabetes Sister   . Diabetes Paternal Grandmother   . Heart disease Neg Hx   . Stroke Neg Hx   . COPD Neg Hx    Social History   Tobacco Use  . Smoking status: Never Smoker  . Smokeless tobacco: Never Used  Substance Use Topics  . Alcohol use: No  . Drug use: No    Interim medical history since last visit reviewed. Allergies and medications reviewed  Review of Systems Per HPI unless specifically indicated above     Objective:    BP 116/86   Pulse 89   Temp 98.4 F (36.9 C) (Oral)   Resp 16   Wt 295 lb 11.2 oz (134.1 kg)   SpO2 98%   BMI 49.21 kg/m   Wt Readings from Last 3 Encounters:  07/28/17 295 lb 11.2 oz (134.1 kg)  01/28/17 286 lb 6.4 oz (129.9 kg)  12/01/16 285 lb 9.6 oz (129.5 kg)    Physical Exam  Constitutional: She appears well-developed and well-nourished.  No distress.  Morbidly obese; weight gain 10+ pounds over last 6 months  HENT:  Head: Normocephalic and atraumatic.  Eyes: EOM are normal. No scleral icterus.  Neck: No thyromegaly present.  Cardiovascular: Normal rate, regular rhythm and normal heart sounds.  No murmur heard. Pulmonary/Chest: Effort normal and breath sounds normal.  Abdominal: She exhibits no distension.  Musculoskeletal: She exhibits no edema.  Neurological: She is alert. She exhibits normal muscle tone.  Skin: Skin is warm and dry. She is not diaphoretic. No pallor.  Psychiatric: She has a normal mood and affect. Her behavior is normal. Judgment and thought content normal.    Results for orders placed or performed in visit on 03/09/17  VITAMIN D 25 Hydroxy (Vit-D Deficiency, Fractures)  Result Value Ref Range   Vit D, 25-Hydroxy 30 30 - 100 ng/mL  Lipid panel  Result Value Ref Range   Cholesterol 159 <200 mg/dL   HDL 46 (L) >50 mg/dL   Triglycerides 66 <150 mg/dL   LDL  Cholesterol (Calc) 98 mg/dL (calc)   Total CHOL/HDL Ratio 3.5 <5.0 (calc)   Non-HDL Cholesterol (Calc) 113 <130 mg/dL (calc)  COMPLETE METABOLIC PANEL WITH GFR  Result Value Ref Range   Glucose, Bld 81 65 - 99 mg/dL   BUN 12 7 - 25 mg/dL   Creat 0.70 0.50 - 1.10 mg/dL   GFR, Est Non African American 114 > OR = 60 mL/min/1.23m2   GFR, Est African American 132 > OR = 60 mL/min/1.63m2   BUN/Creatinine Ratio NOT APPLICABLE 6 - 22 (calc)   Sodium 138 135 - 146 mmol/L   Potassium 4.0 3.5 - 5.3 mmol/L   Chloride 107 98 - 110 mmol/L   CO2 22 20 - 32 mmol/L   Calcium 9.0 8.6 - 10.2 mg/dL   Total Protein 6.8 6.1 - 8.1 g/dL   Albumin 4.2 3.6 - 5.1 g/dL   Globulin 2.6 1.9 - 3.7 g/dL (calc)   AG Ratio 1.6 1.0 - 2.5 (calc)   Total Bilirubin 0.3 0.2 - 1.2 mg/dL   Alkaline phosphatase (APISO) 40 33 - 115 U/L   AST 12 10 - 30 U/L   ALT 11 6 - 29 U/L      Assessment & Plan:   Problem List Items Addressed This Visit      Cardiovascular and Mediastinum   Hypertension (Chronic)    Well-controlled now on lower dose of labetalol; weight loss will significantly help and I am hopeful that as she loses significant weight, we can start to decrease BP meds      Relevant Medications   labetalol (NORMODYNE) 200 MG tablet     Endocrine   IFG (impaired fasting glucose) (Chronic)    Hx of prediabetes; last A1c was completely normal; will check glucose at follow-up; weight loss is key for not progressing to outright type 2 DM        Other   Morbid obesity (HCC) (Chronic)    Patient sounds motivated to lose weight; declined referral to nutritionist; has seen bariatric specialist; does not want surgery; will start Saxenda (no hx MTC or MEN-2); return in 6 weeks; use MyFitnessPal to get an idea of calorie intake and drop it by 500 kcal per day; adequate water, education / counseling / encouragement provided      Relevant Medications   Liraglutide -Weight Management (SAXENDA) 18 MG/3ML SOPN        Follow up plan: Return in about 6 weeks (around 09/08/2017) for follow-up visit with  Dr. Sanda Klein.  An after-visit summary was printed and given to the patient at [facility].  Please see the patient instructions which may contain other information and recommendations beyond what is mentioned above in the assessment and plan.  Meds ordered this encounter  Medications  . DISCONTD: Liraglutide -Weight Management (SAXENDA) 18 MG/3ML SOPN    Sig: Inject 0.6 mg into the skin daily. x 1 week, then 1.2 mg daily x 1 week, then 1.8 mg daily x 1 week, then 2.4 mg daily x 1 week, then 3 mg daily    Dispense:  3 mL    Refill:  0  . labetalol (NORMODYNE) 200 MG tablet    Sig: TAKE 1 TABLETS BY MOUTH 3  TIMES DAILY    Dispense:  270 tablet    Refill:  1  . DISCONTD: Insulin Pen Needle (PEN NEEDLES) 31G X 8 MM MISC    Sig: Use with Saxenda pen; inject subcutaneously once a day    Dispense:  30 each    Refill:  2  . Liraglutide -Weight Management (SAXENDA) 18 MG/3ML SOPN    Sig: Inject 0.6 mg into the skin daily. x 1 week, then 1.2 mg daily x 1 week, then 1.8 mg daily x 1 week, then 2.4 mg daily x 1 week, then 3 mg daily    Dispense:  3 mL    Refill:  0  . Insulin Pen Needle (PEN NEEDLES) 31G X 8 MM MISC    Sig: Use with Saxenda pen; inject subcutaneously once a day    Dispense:  30 each    Refill:  2    No orders of the defined types were placed in this encounter.

## 2017-07-28 NOTE — Assessment & Plan Note (Signed)
Hx of prediabetes; last A1c was completely normal; will check glucose at follow-up; weight loss is key for not progressing to outright type 2 DM

## 2017-07-28 NOTE — Assessment & Plan Note (Signed)
Well-controlled now on lower dose of labetalol; weight loss will significantly help and I am hopeful that as she loses significant weight, we can start to decrease BP meds

## 2017-07-30 ENCOUNTER — Telehealth: Payer: Self-pay | Admitting: Family Medicine

## 2017-07-30 NOTE — Telephone Encounter (Signed)
Please let pt know that her insurance company will not cover the [place] right now We'll have her see the nutritionist Have her use MyFitnessPal or other calorie counter and record every single thing she eats and drinks Also, have her log her exercise / activity Bring those in to me for an appt in 4 weeks We'll document her progress and go from there After 16 weeks, we may be able to get it approved but we'll have to have her work on and record her intake and output (exercise)

## 2017-07-30 NOTE — Telephone Encounter (Signed)
Pt notified and verbalized info given.

## 2017-08-09 ENCOUNTER — Encounter: Payer: Self-pay | Admitting: Family Medicine

## 2017-08-13 ENCOUNTER — Telehealth: Payer: Self-pay | Admitting: Family Medicine

## 2017-08-13 ENCOUNTER — Encounter: Payer: Self-pay | Admitting: Family Medicine

## 2017-08-13 MED ORDER — FLUTICASONE PROPIONATE 50 MCG/ACT NA SUSP: 2.0000 | NASAL | 3 refills | Status: DC | PRN

## 2017-08-13 NOTE — Telephone Encounter (Signed)
Copied from [place] [id number]. Topic: Quick Communication - See Telephone Encounter >> Aug 13, 2017  3:02 PM Synthia Innocent wrote: CRM for notification. See Telephone encounter for: 08/13/17. Pharmacy has questions regarding labetalol (NORMODYNE) 200 MG tablet and fluticasone (FLONASE) 50 MCG/ACT nasal spray, need clarification Ref # [id number]

## 2017-08-13 NOTE — Telephone Encounter (Signed)
Already addressed by fax

## 2017-08-16 NOTE — Telephone Encounter (Signed)
Alma Friendly with [facility] received the fax on Labetalol. But they have not received the clarification on frequency of the Flonase. Call back number [phone].

## 2017-08-17 ENCOUNTER — Encounter: Payer: Self-pay | Admitting: Family Medicine

## 2017-08-17 NOTE — Telephone Encounter (Signed)
Called and spoke with Cherrie from [facility] and everything was clarified.

## 2017-08-17 NOTE — Telephone Encounter (Signed)
Kim with [facility] calling back checking on status of Flonase clarification.

## 2017-08-18 ENCOUNTER — Other Ambulatory Visit: Payer: Self-pay | Admitting: Family Medicine

## 2017-08-18 MED ORDER — FLUTICASONE PROPIONATE 50 MCG/ACT NA SUSP: 2.0000 | Freq: Every day | NASAL | 3 refills | Status: DC | PRN

## 2017-08-18 NOTE — Progress Notes (Signed)
Clarify nasal spray

## 2017-08-18 NOTE — Telephone Encounter (Signed)
addressed

## 2017-08-24 ENCOUNTER — Other Ambulatory Visit: Payer: Self-pay | Admitting: Family Medicine

## 2017-09-03 ENCOUNTER — Encounter: Payer: Self-pay | Admitting: Family Medicine

## 2017-09-03 ENCOUNTER — Other Ambulatory Visit: Payer: Self-pay | Admitting: Family Medicine

## 2017-09-16 ENCOUNTER — Ambulatory Visit: Payer: BLUE CROSS/BLUE SHIELD | Admitting: Family Medicine

## 2017-09-16 ENCOUNTER — Encounter: Payer: Self-pay | Admitting: Family Medicine

## 2017-09-16 VITALS — BP 124/82 | HR 79 | Temp 98.2°F | Resp 14 | Ht 65.0 in | Wt 281.7 lb

## 2017-09-16 DIAGNOSIS — E782 Mixed hyperlipidemia: Secondary | ICD-10-CM | POA: Diagnosis not present

## 2017-09-16 DIAGNOSIS — H1013 Acute atopic conjunctivitis, bilateral: Secondary | ICD-10-CM | POA: Diagnosis not present

## 2017-09-16 DIAGNOSIS — I10 Essential (primary) hypertension: Secondary | ICD-10-CM | POA: Diagnosis not present

## 2017-09-16 DIAGNOSIS — R7301 Impaired fasting glucose: Secondary | ICD-10-CM

## 2017-09-16 MED ORDER — OLOPATADINE HCL 0.2 % OP SOLN: OPHTHALMIC | 11 refills | Status: DC

## 2017-09-16 NOTE — Progress Notes (Signed)
BP 124/82   Pulse 79   Temp 98.2 F (36.8 C) (Oral)   Resp 14   Ht 5\' 5"  (1.651 m)   Wt 281 lb 11.2 oz (127.8 kg)   LMP 08/23/2017   SpO2 99%   BMI 46.88 kg/m    Subjective:    Patient ID: Mckenzie Lopez, female    DOB: May 02, 1986, 32 y.o.   MRN: [id number]  HPI: Mckenzie Lopez is a 32 y.o. female  Chief Complaint  Patient presents with  . Follow-up    weight loss  . Conjunctivitis    crusty watery itchy eyes may be due to allergies    HPI  She has allergies; she has allergic conjunctivitis; itching, when outside; yesterday morning and this morning felt like sand was in it and itching real bad; switched eyes last night  She is morbidly obese, but has lost weight; doing walking at work; grilling and baking her foods, drinking extra water Clothes are already fitting differently; on the saxenda  HTN; controlled today on multiple meds  Depression screen [facility] 2/9 09/16/2017 07/28/2017 01/28/2017 12/01/2016 08/24/2016  Decreased Interest 0 0 0 0 0  Down, Depressed, Hopeless 0 0 0 0 0  PHQ - 2 Score 0 0 0 0 0    Relevant past medical, surgical, family and social history reviewed Past Medical History:  Diagnosis Date  . 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 Hyperlipidemia   . Hypertension   . IFG (impaired fasting glucose)   . Low back pain   . Migraines   . Morbid obesity ([place])    Past Surgical History:  Procedure Laterality Date  . NO PAST SURGERIES     Family History  Problem Relation Age of Onset  . Diabetes Mother   . Hypertension Mother   . Thyroid disease Mother   . Breast cancer Maternal Grandmother 25  . Diabetes Maternal Grandfather   . Hypertension Maternal Grandfather   . Seizures Maternal Grandfather   . Breast cancer Maternal Aunt 27  . Diabetes Sister   . Diabetes Paternal Grandmother   . Heart disease Neg Hx   . Stroke Neg Hx   . COPD Neg Hx    Social History   Tobacco Use  . Smoking status: Never Smoker  . Smokeless  tobacco: Never Used  Substance Use Topics  . Alcohol use: No  . Drug use: No    Interim medical history since last visit reviewed. Allergies and medications reviewed  Review of Systems Per HPI unless specifically indicated above     Objective:    BP 124/82   Pulse 79   Temp 98.2 F (36.8 C) (Oral)   Resp 14   Ht 5\' 5"  (1.651 m)   Wt 281 lb 11.2 oz (127.8 kg)   LMP 08/23/2017   SpO2 99%   BMI 46.88 kg/m   Wt Readings from Last 3 Encounters:  09/16/17 281 lb 11.2 oz (127.8 kg)  07/28/17 295 lb 11.2 oz (134.1 kg)  01/28/17 286 lb 6.4 oz (129.9 kg)    Physical Exam  Constitutional: She appears well-developed and well-nourished.  Weight down 14 pounds in last 6-7 weks  HENT:  Mouth/Throat: Mucous membranes are normal.  Eyes: EOM and lids are normal. Right eye exhibits no exudate. Left eye exhibits no exudate. Right conjunctiva is not injected. Left conjunctiva is not injected. No scleral icterus. Right eye exhibits normal extraocular motion and no nystagmus. Left eye exhibits normal extraocular motion and  no nystagmus.  Cardiovascular: Normal rate and regular rhythm.  Pulmonary/Chest: Effort normal and breath sounds normal.  Skin: No pallor.  Skin changes c/w acanthosis nigricans around neck  Psychiatric: She has a normal mood and affect. Her behavior is normal. Her mood appears not anxious. She does not exhibit a depressed mood.      Assessment & Plan:   Problem List Items Addressed This Visit      Cardiovascular and Mediastinum   Hypertension (Chronic)    Controlled with multiple meds; it is my hope that as she loses significant weight, we can start to reduce/remove some of these medicines        Endocrine   IFG (impaired fasting glucose) (Chronic)    Reviewed last labs; will check labs at next visit; expect numbers to improve as she loses weight        Other   Morbid obesity (HCC) (Chronic)    Continue healthier eating, movement, water intake, Saxenda;  praise given      Hyperlipidemia    Reviewed last lipid panel; expect this panel to improve with weight loss      Allergic conjunctivitis of both eyes - Primary    Start pataday, rx given          Follow up plan: Return in about 3 months (around 12/17/2017) for twenty minute follow-up with fasting labs.  An after-visit summary was printed and given to the patient at [facility].  Please see the patient instructions which may contain other information and recommendations beyond what is mentioned above in the assessment and plan.  Meds ordered this encounter  Medications  . Olopatadine HCl 0.2 % SOLN    Sig: One drop in each eye daily    Dispense:  2.5 mL    Refill:  11    No orders of the defined types were placed in this encounter.

## 2017-09-16 NOTE — Assessment & Plan Note (Signed)
Start pataday, rx given

## 2017-09-16 NOTE — Assessment & Plan Note (Signed)
Controlled with multiple meds; it is my hope that as she loses significant weight, we can start to reduce/remove some of these medicines

## 2017-09-16 NOTE — Assessment & Plan Note (Signed)
Reviewed last lipid panel; expect this panel to improve with weight loss

## 2017-09-16 NOTE — Assessment & Plan Note (Signed)
Continue healthier eating, movement, water intake, Saxenda; praise given

## 2017-09-16 NOTE — Assessment & Plan Note (Signed)
Reviewed last labs; will check labs at next visit; expect numbers to improve as she loses weight

## 2017-09-16 NOTE — Patient Instructions (Signed)
Try to use PLAIN allergy medicine without the decongestant Avoid: phenylephrine, phenylpropanolamine, and pseudoephredine  Use the new eye drops  Keep up the amazing job with weight loss Check out the information at familydoctor.org entitled "Nutrition for Weight Loss: What You Need to Know about Fad Diets" Try to lose between 1-2 pounds per week by taking in fewer calories and burning off more calories You can succeed by limiting portions, limiting foods dense in calories and fat, becoming more active, and drinking 8 glasses of water a day (64 ounces) Don't skip meals, especially breakfast, as skipping meals may alter your metabolism Do not use over-the-counter weight loss pills or gimmicks that claim rapid weight loss A healthy BMI (or body mass index) is between 18.5 and 24.9 You can calculate your ideal BMI at the [place] website [employer]fr

## 2017-09-22 ENCOUNTER — Ambulatory Visit: Payer: 59 | Admitting: Internal Medicine

## 2017-10-28 ENCOUNTER — Encounter: Payer: Self-pay | Admitting: Internal Medicine

## 2017-10-28 ENCOUNTER — Telehealth: Payer: Self-pay | Admitting: Internal Medicine

## 2017-10-28 NOTE — Telephone Encounter (Signed)
3 attempts to schedule fu appt from recall list.   Deleting recall.  °Mailed Letter  °

## 2017-10-30 ENCOUNTER — Encounter: Payer: Self-pay | Admitting: Family Medicine

## 2017-10-30 ENCOUNTER — Other Ambulatory Visit: Payer: Self-pay | Admitting: Family Medicine

## 2017-11-01 NOTE — Telephone Encounter (Signed)
Roselyn Reef, please get her a discount card and respond back to patient; thank you

## 2017-11-02 ENCOUNTER — Ambulatory Visit (INDEPENDENT_AMBULATORY_CARE_PROVIDER_SITE_OTHER): Payer: BLUE CROSS/BLUE SHIELD | Admitting: Obstetrics and Gynecology

## 2017-11-02 ENCOUNTER — Encounter: Payer: Self-pay | Admitting: Obstetrics and Gynecology

## 2017-11-02 VITALS — BP 130/84 | HR 92 | Ht 65.0 in | Wt 274.0 lb

## 2017-11-02 DIAGNOSIS — Z3009 Encounter for other general counseling and advice on contraception: Secondary | ICD-10-CM | POA: Diagnosis not present

## 2017-11-02 DIAGNOSIS — Z1239 Encounter for other screening for malignant neoplasm of breast: Secondary | ICD-10-CM

## 2017-11-02 DIAGNOSIS — N6011 Diffuse cystic mastopathy of right breast: Secondary | ICD-10-CM | POA: Diagnosis not present

## 2017-11-02 DIAGNOSIS — N6012 Diffuse cystic mastopathy of left breast: Secondary | ICD-10-CM | POA: Diagnosis not present

## 2017-11-02 DIAGNOSIS — Z01419 Encounter for gynecological examination (general) (routine) without abnormal findings: Secondary | ICD-10-CM

## 2017-11-02 DIAGNOSIS — Z01411 Encounter for gynecological examination (general) (routine) with abnormal findings: Secondary | ICD-10-CM

## 2017-11-02 DIAGNOSIS — N926 Irregular menstruation, unspecified: Secondary | ICD-10-CM

## 2017-11-02 DIAGNOSIS — Z803 Family history of malignant neoplasm of breast: Secondary | ICD-10-CM | POA: Diagnosis not present

## 2017-11-02 DIAGNOSIS — Z1231 Encounter for screening mammogram for malignant neoplasm of breast: Secondary | ICD-10-CM | POA: Diagnosis not present

## 2017-11-02 NOTE — Progress Notes (Signed)
Chief Complaint  Patient presents with  . Gynecologic Exam     HPI:      [name] is a 32 y.o. G1P1001 who LMP was No LMP recorded. (Menstrual status: Irregular Periods)., presents today for her annual examination. Her menses are monthly, lasting 3 days.  Dysmenorrhea none. She does have intermenstrual bleeding. Nexplanon removed 6/18 due to affecting BP.  Sex activity: not sexually active. Wants TL. Last Pap: 10/13/16  Results were: no abnormalities /neg HPV DNA  Hx of STDs: chlamydia, CIN 1 in 2014  There is a FH of breast cancer in her MGM and mat aunt, genetic testing not done. There is no FH of ovarian cancer. The patient does do self-breast exams. Pt didn't have mammo last yr. Due to start yearly due to [place].  Tobacco use: The patient denies current or previous tobacco use. Alcohol use: none Exercise: moderately active  She does get adequate calcium and Vitamin D in her diet.  She has her labs managed with PCP.   Past Medical History:  Diagnosis Date  . 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 Hyperlipidemia   . Hypertension   . IFG (impaired fasting glucose)   . Low back pain   . Migraines   . Morbid obesity ([place])     Past Surgical History:  Procedure Laterality Date  . NO PAST SURGERIES      Family History  Problem Relation Age of Onset  . Diabetes Mother   . Hypertension Mother   . Thyroid disease Mother   . Breast cancer Maternal Grandmother 25  . Diabetes Maternal Grandfather   . Hypertension Maternal Grandfather   . Seizures Maternal Grandfather   . Breast cancer Maternal Aunt 27  . Diabetes Sister   . Diabetes Paternal Grandmother   . Heart disease Neg Hx   . Stroke Neg Hx   . COPD Neg Hx     Social History   Socioeconomic History  . Marital status: Single    Spouse name: Not on file  . Number of children: Not on file  . Years of education: Not on file  . Highest education level: Not on file  Occupational History  .  Not on file  Social Needs  . Financial resource strain: Not on file  . Food insecurity:    Worry: Not on file    Inability: Not on file  . Transportation needs:    Medical: Not on file    Non-medical: Not on file  Tobacco Use  . Smoking status: Never Smoker  . Smokeless tobacco: Never Used  Substance and Sexual Activity  . Alcohol use: No  . Drug use: No  . Sexual activity: Not Currently    Birth control/protection: None  Lifestyle  . Physical activity:    Days per week: Not on file    Minutes per session: Not on file  . Stress: Not on file  Relationships  . Social connections:    Talks on phone: Not on file    Gets together: Not on file    Attends religious service: Not on file    Active member of club or organization: Not on file    Attends meetings of clubs or organizations: Not on file    Relationship status: Not on file  . Intimate partner violence:    Fear of current or ex partner: Not on file    Emotionally abused: Not on file    Physically abused: Not on  file    Forced sexual activity: Not on file  Other Topics Concern  . Not on file  Social History Narrative  . Not on file    Current Outpatient Medications on File Prior to Visit  Medication Sig Dispense Refill  . amLODipine (NORVASC) 5 MG tablet TAKE 1 TABLET BY MOUTH  DAILY 90 tablet 1  . aspirin EC 81 MG tablet Take 1 tablet (81 mg total) by mouth daily.    . Blood Pressure Monitoring (BLOOD PRESSURE MONITOR/L CUFF) MISC Large cuff; check BP; fluctuating blood pressures; HTN; LON 99 months 1 each 0  . Cholecalciferol (VITAMIN D) 2000 UNITS CAPS Take 2,000 Units by mouth daily.    . hydrALAZINE (APRESOLINE) 50 MG tablet TAKE 1 TABLET BY MOUTH 3  TIMES DAILY 270 tablet 2  . Insulin Pen Needle (PEN NEEDLES) 31G X 8 MM MISC Use with Saxenda pen; inject subcutaneously once a day 30 each 2  . labetalol (NORMODYNE) 200 MG tablet TAKE 1 TABLETS BY MOUTH 3  TIMES DAILY 270 tablet 1  . Olopatadine HCl 0.2 % SOLN One  drop in each eye daily 2.5 mL 11  . SAXENDA 18 MG/3ML SOPN INJECT 3 MG INTO THE SKIN DAILY 5 pen 1  . fluticasone (FLONASE) 50 MCG/ACT nasal spray Place 2 sprays into both nostrils daily as needed. (Patient not taking: Reported on 11/02/2017) 48 g 3  . hydrocortisone valerate cream (WESTCORT) 0.2 % Apply 1 application topically as needed (itching).      No current facility-administered medications on file prior to visit.     ROS:  Review of Systems  Constitutional: Negative for fatigue, fever and unexpected weight change.  Respiratory: Negative for cough, shortness of breath and wheezing.   Cardiovascular: Negative for chest pain, palpitations and leg swelling.  Gastrointestinal: Negative for blood in stool, constipation, diarrhea, nausea and vomiting.  Endocrine: Negative for cold intolerance, heat intolerance and polyuria.  Genitourinary: Negative for dyspareunia, dysuria, flank pain, frequency, genital sores, hematuria, menstrual problem, pelvic pain, urgency, vaginal bleeding, vaginal discharge and vaginal pain.  Musculoskeletal: Negative for back pain, joint swelling and myalgias.  Skin: Negative for rash.  Neurological: Negative for dizziness, syncope, light-headedness, numbness and headaches.  Hematological: Negative for adenopathy.  Psychiatric/Behavioral: Negative for agitation, confusion, sleep disturbance and suicidal ideas. The patient is not nervous/anxious.      Objective: BP 130/84   Pulse 92   Ht 5\' 5"  (1.651 m)   Wt 274 lb (124.3 kg)   Breastfeeding? No   BMI 45.60 kg/m    Physical Exam  Constitutional: She is oriented to person, place, and time. She appears well-developed and well-nourished.  Genitourinary: Vagina normal and uterus normal. There is no rash or tenderness on the right labia. There is no rash or tenderness on the left labia. No erythema or tenderness in the vagina. No vaginal discharge found. Right adnexum does not display mass and does not display  tenderness. Left adnexum does not display mass and does not display tenderness. Cervix does not exhibit motion tenderness or polyp. Uterus is not enlarged or tender.  Neck: Normal range of motion. No thyromegaly present.  Cardiovascular: Normal rate, regular rhythm and normal heart sounds.  No murmur heard. Pulmonary/Chest: Effort normal and breath sounds normal. Right breast exhibits no mass, no nipple discharge, no skin change and no tenderness. Left breast exhibits no mass, no nipple discharge, no skin change and no tenderness.  FIBROCYSTIC BREASTS BILAT WITHOUT DISCRETE MASS; PROMINENT AREA RT BREAST  11:00 AND LT BREAST 11:00    Abdominal: Soft. There is no tenderness. There is no guarding.  Musculoskeletal: Normal range of motion.  Neurological: She is alert and oriented to person, place, and time. No cranial nerve deficit.  Psychiatric: She has a normal mood and affect. Her behavior is normal.  Vitals reviewed.   Assessment/Plan: Encounter for annual routine gynecological examination  Screening for breast cancer - Pt to sched mammo - Plan: MM 3D SCREEN BREAST BILATERAL  Family history of breast cancer - MyRisk testing discussed adn pt declines. Start yearly mammos due to [place]. - Plan: MM 3D SCREEN BREAST BILATERAL  Encounter for other general counseling or advice on contraception - Pt would like TL. F/u with MD for consult.         GYN counsel breast self exam, mammography screening, adequate intake of calcium and vitamin D, diet and exercise     F/U  Return in about 1 week (around 11/09/2017) for with MD for TL consult.  Alicja Everitt B. Jaqueline Uber, PA-C 11/02/2017 1:52 PM

## 2017-11-02 NOTE — Patient Instructions (Signed)
I value your feedback and entrusting us with your care. If you get a [facility] patient survey, I would appreciate you taking the time to let us know about your experience today. Thank you! 

## 2017-11-05 DIAGNOSIS — L700 Acne vulgaris: Secondary | ICD-10-CM | POA: Diagnosis not present

## 2017-11-05 DIAGNOSIS — L280 Lichen simplex chronicus: Secondary | ICD-10-CM | POA: Diagnosis not present

## 2017-11-08 DIAGNOSIS — D0511 Intraductal carcinoma in situ of right breast: Secondary | ICD-10-CM

## 2017-11-08 HISTORY — DX: Intraductal carcinoma in situ of right breast: D05.11

## 2017-11-10 ENCOUNTER — Ambulatory Visit (INDEPENDENT_AMBULATORY_CARE_PROVIDER_SITE_OTHER): Payer: BLUE CROSS/BLUE SHIELD | Admitting: Obstetrics & Gynecology

## 2017-11-10 ENCOUNTER — Encounter: Payer: Self-pay | Admitting: Obstetrics & Gynecology

## 2017-11-10 VITALS — BP 130/90 | Ht 65.0 in | Wt 270.0 lb

## 2017-11-10 DIAGNOSIS — Z3009 Encounter for other general counseling and advice on contraception: Secondary | ICD-10-CM | POA: Diagnosis not present

## 2017-11-10 NOTE — Progress Notes (Signed)
PRE-OPERATIVE HISTORY AND PHYSICAL EXAM  HPI:  Mckenzie Lopez is a 32 y.o. G1P1001 Patient's last menstrual period was 10/18/2017.; she is being admitted for surgery related to requested sterilization.  Pt has one child.  She has HTN and also had preeclampsia.  She has not tolerated well OCPs or Nexplanon.  She desires no more pregnancies.  PMHx: Past Medical History:  Diagnosis Date  . 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 Hyperlipidemia   . Hypertension   . IFG (impaired fasting glucose)   . Low back pain   . Migraines   . Morbid obesity ([place])    Past Surgical History:  Procedure Laterality Date  . NO PAST SURGERIES     Family History  Problem Relation Age of Onset  . Diabetes Mother   . Hypertension Mother   . Thyroid disease Mother   . Breast cancer Maternal Grandmother 25  . Diabetes Maternal Grandfather   . Hypertension Maternal Grandfather   . Seizures Maternal Grandfather   . Breast cancer Maternal Aunt 27  . Diabetes Sister   . Diabetes Paternal Grandmother   . Heart disease Neg Hx   . Stroke Neg Hx   . COPD Neg Hx   . Ovarian cancer Neg Hx    Social History   Tobacco Use  . Smoking status: Never Smoker  . Smokeless tobacco: Never Used  Substance Use Topics  . Alcohol use: No  . Drug use: No    Current Outpatient Medications:  .  aspirin EC 81 MG tablet, Take 1 tablet (81 mg total) by mouth daily., Disp: , Rfl:  .  Blood Pressure Monitoring (BLOOD PRESSURE MONITOR/L CUFF) MISC, Large cuff; check BP; fluctuating blood pressures; HTN; LON 99 months, Disp: 1 each, Rfl: 0 .  Cholecalciferol (VITAMIN D) 2000 UNITS CAPS, Take 2,000 Units by mouth daily., Disp: , Rfl:  .  fluticasone (FLONASE) 50 MCG/ACT nasal spray, Place 2 sprays into both nostrils daily as needed., Disp: 48 g, Rfl: 3 .  hydrALAZINE (APRESOLINE) 50 MG tablet, TAKE 1 TABLET BY MOUTH 3  TIMES DAILY, Disp: 270 tablet, Rfl: 2 .  hydrocortisone valerate cream (WESTCORT) 0.2 %, Apply 1  application topically as needed (itching). , Disp: , Rfl:  .  Insulin Pen Needle (PEN NEEDLES) 31G X 8 MM MISC, Use with Saxenda pen; inject subcutaneously once a day, Disp: 30 each, Rfl: 2 .  labetalol (NORMODYNE) 200 MG tablet, TAKE 1 TABLETS BY MOUTH 3  TIMES DAILY, Disp: 270 tablet, Rfl: 1 .  Olopatadine HCl 0.2 % SOLN, One drop in each eye daily, Disp: 2.5 mL, Rfl: 11 .  SAXENDA 18 MG/3ML SOPN, INJECT 3 MG INTO THE SKIN DAILY, Disp: 5 pen, Rfl: 1 .  amLODipine (NORVASC) 5 MG tablet, TAKE 1 TABLET BY MOUTH  DAILY, Disp: 90 tablet, Rfl: 1 Allergies: Chlorthalidone  Review of Systems  Constitutional: Negative for chills, fever and malaise/fatigue.  HENT: Negative for congestion, sinus pain and sore throat.   Eyes: Negative for blurred vision and pain.  Respiratory: Negative for cough and wheezing.   Cardiovascular: Negative for chest pain and leg swelling.  Gastrointestinal: Negative for abdominal pain, constipation, diarrhea, heartburn, nausea and vomiting.  Genitourinary: Negative for dysuria, frequency, hematuria and urgency.  Musculoskeletal: Negative for back pain, joint pain, myalgias and neck pain.  Skin: Negative for itching and rash.  Neurological: Negative for dizziness, tremors and weakness.  Endo/Heme/Allergies: Does not bruise/bleed easily.  Psychiatric/Behavioral: Negative for depression.  The patient is not nervous/anxious and does not have insomnia.    Objective: BP 130/90   Ht 5\' 5"  (1.651 m)   Wt 270 lb (122.5 kg)   LMP 10/18/2017   BMI 44.93 kg/m   Filed Weights   11/10/17 1339  Weight: 270 lb (122.5 kg)   Physical Exam  Constitutional: She is oriented to person, place, and time. She appears well-developed and well-nourished. No distress.  HENT:  Head: Normocephalic and atraumatic. Head is without laceration.  Right Ear: Hearing normal.  Left Ear: Hearing normal.  Nose: No epistaxis.  No foreign bodies.  Mouth/Throat: Uvula is midline, oropharynx is clear  and moist and mucous membranes are normal.  Eyes: Pupils are equal, round, and reactive to light.  Neck: Normal range of motion. Neck supple. No thyromegaly present.  Cardiovascular: Normal rate and regular rhythm. Exam reveals no gallop and no friction rub.  No murmur heard. Pulmonary/Chest: Effort normal and breath sounds normal. No respiratory distress. She has no wheezes. Right breast exhibits no mass, no skin change and no tenderness. Left breast exhibits no mass, no skin change and no tenderness.  Abdominal: Soft. Bowel sounds are normal. She exhibits no distension. There is no tenderness. There is no rebound.  Musculoskeletal: Normal range of motion.  Neurological: She is alert and oriented to person, place, and time. No cranial nerve deficit.  Skin: Skin is warm and dry.  Psychiatric: She has a normal mood and affect. Judgment normal.  Vitals reviewed.  Assessment: 1. Sterilization consult   All BC d/w pt, prefers laparoscopy with bilateral salpingectomies for sterilization.  I have had a careful discussion with this patient about all the options available and the risk/benefits of each. I have fully informed this patient that surgery may subject her to a variety of discomforts and risks: She understands that most patients have surgery with little difficulty, but problems can happen ranging from minor to fatal. These include nausea, vomiting, pain, bleeding, infection, poor healing, hernia, or formation of adhesions. Unexpected reactions may occur from any drug or anesthetic given. Unintended injury may occur to other pelvic or abdominal structures such as Fallopian tubes, ovaries, bladder, ureter (tube from kidney to bladder), or bowel. Nerves going from the pelvis to the legs may be injured. Any such injury may require immediate or later additional surgery to correct the problem. Excessive blood loss requiring transfusion is very unlikely but possible. Dangerous blood clots may form in the  legs or lungs. Physical and sexual activity will be restricted in varying degrees for an indeterminate period of time but most often 2-6 weeks.  Finally, she understands that it is impossible to list every possible undesirable effect and that the condition for which surgery is done is not always cured or significantly improved, and in rare cases may be even worse.Ample time was given to answer all questions.  The patient has been fully informed about all methods of contraception, both temporary and permanent. She understands that tubal ligation is meant to be permanent, absolute and irreversible. She was told that there is an approximately 1 in 400 chance of a pregnancy in the future after tubal ligation. She was told the short and long term complications of tubal ligation. She understands the risks from this surgery include, but are not limited to, the risks of anesthesia, hemorrhage, infection, perforation, and injury to adjacent structures, bowel, bladder and blood vessels.   Barnett Applebaum, MD, [facility], [facility] 11/10/2017  2:10 PM

## 2017-11-10 NOTE — Patient Instructions (Signed)
What You Need to Know About Female Sterilization Female sterilization is surgery to prevent pregnancy. In this surgery, the fallopian tubes are either blocked or closed off. This prevents eggs from reaching the uterus so that the eggs cannot be fertilized by sperm and you cannot get pregnant. Sterilization is permanent. It should only be done if you are sure that you do not want to be able to have children. What are the sterilization surgery options? There are several kinds of female sterilization surgeries. They include:  Laparoscopic tubal ligation. In this surgery, the fallopian tubes are tied off, sealed with heat, or blocked with a clip, ring, or clamp. A small portion of each fallopian tube may also be removed. This surgery is done through several small cuts (incisions).  Postpartum tubal ligation. This is also called a mini-laparotomy. This surgery is done right after childbirth or 1 or 2 days after childbirth. In this surgery, the fallopian tubes are tied off, sealed with heat, or blocked with a clip, ring, or clamp. A small portion of each fallopian tube may also be removed. The surgery is done through a single incision.  Hysteroscopic sterilization. In this surgery, a tiny, spring-like coil is inserted through the cervix and uterus into the fallopian tubes. The coil causes scarring, which blocks the tubes. After the surgery, contraception should be used for 3 months to allow the scar tissue to form completely.  Is sterilization safe? Generally, sterilization is safe. Complications are rare. However, there are risks. They include:  Bleeding.  Infection.  Reaction to medicine used during the procedure.  Injury to surrounding organs.  Failure of the procedure.  How effective is sterilization? Sterilization is nearly 100% effective, but it can fail. Also, the fallopian tubes can grow back together over time. If this happens, you will be able to get pregnant again. Women who have had  this procedure have a higher chance of having an ectopic pregnancy. An ectopic pregnancy is a pregnancy that happens outside of the uterus. This kind of pregnancy is unsuccessful and can lead to serious bleeding if it is not treated. What are the benefits?  It is usually effective for a lifetime.  It is usually safe.  It does not have the drawbacks of other types of birth control: That means: ? Your hormones are not affected. Because of this, your menstrual periods, sexual desire, and sexual performance will not be affected. ? There are no side effects. What are the drawbacks?  If you change your mind and decide that you want to have children, you may not be able to. Sterilization may be reversed, but a reversal is not always successful.  It does not provide protection against STDs (sexually transmitted diseases).  It increases the chance of having an ectopic pregnancy. This information is not intended to replace advice given to you by your health care provider. Make sure you discuss any questions you have with your health care provider. Document Released: 10/14/2007 Document Revised: 12/19/2015 Document Reviewed: 01/22/2015 Elsevier Interactive Patient Education  2018 Elsevier Inc.  

## 2017-11-11 NOTE — Addendum Note (Signed)
Addended by: Gae Dry on: 11/11/2017 09:51 AM   Modules accepted: Orders, SmartSet

## 2017-11-12 ENCOUNTER — Telehealth: Payer: Self-pay | Admitting: Obstetrics & Gynecology

## 2017-11-12 NOTE — Telephone Encounter (Signed)
-----   Message from Gae Dry, MD sent at 11/10/2017  2:26 PM EDT ----- Regarding: surgery Surgery Booking Request Patient Full Name:  Mckenzie Lopez  MRN: [id number]  DOB: 09-Nov-1985  Surgeon: Hoyt Koch, MD  Requested Surgery Date and Time: 12/09/17 Primary Diagnosis AND Code: Sterilization, Hypertension Secondary Diagnosis and Code:  Surgical Procedure: Laparoscopy, Partial Salpingectomies L&D Notification: No Admission Status: same day surgery Length of Surgery: 30 min Special Case Needs: no H&P: no (date) Phone Interview???: yes Interpreter: Language:  Medical Clearance: no Special Scheduling Instructions: no

## 2017-11-12 NOTE — Telephone Encounter (Signed)
Patient is aware of Pre-admit Testing phone interview on 12/02/17 9am-1pm, and OR on 12/09/17. Patient is aware she may receive calls from the [facility] and [facility]. Patient confirmed BCBS, and no secondary insurance. Ext given.

## 2017-11-19 ENCOUNTER — Other Ambulatory Visit: Payer: Self-pay | Admitting: Obstetrics and Gynecology

## 2017-11-19 ENCOUNTER — Ambulatory Visit
Admission: RE | Admit: 2017-11-19 | Discharge: 2017-11-19 | Disposition: A | Discharge status: Home / Self Care | Payer: BLUE CROSS/BLUE SHIELD | Source: Ambulatory Visit | Attending: Obstetrics and Gynecology | Admitting: Obstetrics and Gynecology

## 2017-11-19 DIAGNOSIS — N6489 Other specified disorders of breast: Secondary | ICD-10-CM

## 2017-11-19 DIAGNOSIS — N6310 Unspecified lump in the right breast, unspecified quadrant: Secondary | ICD-10-CM

## 2017-11-19 DIAGNOSIS — Z1231 Encounter for screening mammogram for malignant neoplasm of breast: Secondary | ICD-10-CM | POA: Diagnosis not present

## 2017-11-19 DIAGNOSIS — Z803 Family history of malignant neoplasm of breast: Secondary | ICD-10-CM

## 2017-11-19 DIAGNOSIS — R928 Other abnormal and inconclusive findings on diagnostic imaging of breast: Secondary | ICD-10-CM

## 2017-11-19 DIAGNOSIS — Z1239 Encounter for other screening for malignant neoplasm of breast: Secondary | ICD-10-CM

## 2017-11-26 ENCOUNTER — Ambulatory Visit
Admission: RE | Admit: 2017-11-26 | Discharge: 2017-11-26 | Disposition: A | Discharge status: Home / Self Care | Payer: BLUE CROSS/BLUE SHIELD | Source: Ambulatory Visit | Attending: Obstetrics and Gynecology | Admitting: Obstetrics and Gynecology

## 2017-11-26 ENCOUNTER — Other Ambulatory Visit: Payer: Self-pay | Admitting: Obstetrics and Gynecology

## 2017-11-26 DIAGNOSIS — R590 Localized enlarged lymph nodes: Secondary | ICD-10-CM | POA: Diagnosis not present

## 2017-11-26 DIAGNOSIS — R928 Other abnormal and inconclusive findings on diagnostic imaging of breast: Secondary | ICD-10-CM | POA: Diagnosis not present

## 2017-11-26 DIAGNOSIS — N6312 Unspecified lump in the right breast, upper inner quadrant: Secondary | ICD-10-CM | POA: Diagnosis not present

## 2017-11-26 DIAGNOSIS — N6489 Other specified disorders of breast: Secondary | ICD-10-CM

## 2017-11-26 DIAGNOSIS — N6311 Unspecified lump in the right breast, upper outer quadrant: Secondary | ICD-10-CM | POA: Diagnosis not present

## 2017-11-26 DIAGNOSIS — N6310 Unspecified lump in the right breast, unspecified quadrant: Secondary | ICD-10-CM | POA: Diagnosis not present

## 2017-11-26 DIAGNOSIS — N6321 Unspecified lump in the left breast, upper outer quadrant: Secondary | ICD-10-CM | POA: Diagnosis not present

## 2017-11-26 DIAGNOSIS — N6322 Unspecified lump in the left breast, upper inner quadrant: Secondary | ICD-10-CM | POA: Diagnosis not present

## 2017-11-29 ENCOUNTER — Other Ambulatory Visit: Payer: Self-pay | Admitting: Obstetrics and Gynecology

## 2017-11-29 DIAGNOSIS — N6311 Unspecified lump in the right breast, upper outer quadrant: Secondary | ICD-10-CM | POA: Diagnosis not present

## 2017-11-29 DIAGNOSIS — R928 Other abnormal and inconclusive findings on diagnostic imaging of breast: Secondary | ICD-10-CM

## 2017-12-01 ENCOUNTER — Ambulatory Visit
Admission: RE | Admit: 2017-12-01 | Discharge: 2017-12-01 | Disposition: A | Discharge status: Home / Self Care | Payer: BLUE CROSS/BLUE SHIELD | Source: Ambulatory Visit | Attending: Obstetrics and Gynecology | Admitting: Obstetrics and Gynecology

## 2017-12-01 ENCOUNTER — Encounter: Payer: Self-pay | Admitting: Radiology

## 2017-12-01 DIAGNOSIS — R928 Other abnormal and inconclusive findings on diagnostic imaging of breast: Secondary | ICD-10-CM

## 2017-12-01 DIAGNOSIS — C773 Secondary and unspecified malignant neoplasm of axilla and upper limb lymph nodes: Secondary | ICD-10-CM | POA: Diagnosis not present

## 2017-12-01 DIAGNOSIS — R590 Localized enlarged lymph nodes: Secondary | ICD-10-CM | POA: Diagnosis not present

## 2017-12-01 DIAGNOSIS — C50411 Malignant neoplasm of upper-outer quadrant of right female breast: Secondary | ICD-10-CM | POA: Diagnosis not present

## 2017-12-01 HISTORY — PX: BREAST BIOPSY: SHX20

## 2017-12-01 HISTORY — PX: AXILLARY LYMPH NODE BIOPSY: SHX5737

## 2017-12-02 ENCOUNTER — Encounter
Admission: RE | Admit: 2017-12-02 | Discharge: 2017-12-02 | Disposition: A | Discharge status: Home / Self Care | Payer: BLUE CROSS/BLUE SHIELD | Source: Ambulatory Visit | Attending: Obstetrics & Gynecology | Admitting: Obstetrics & Gynecology

## 2017-12-02 ENCOUNTER — Other Ambulatory Visit: Payer: Self-pay | Admitting: Anatomic Pathology & Clinical Pathology

## 2017-12-02 ENCOUNTER — Other Ambulatory Visit: Payer: Self-pay | Admitting: Obstetrics & Gynecology

## 2017-12-02 ENCOUNTER — Telehealth: Payer: Self-pay | Admitting: Obstetrics and Gynecology

## 2017-12-02 ENCOUNTER — Other Ambulatory Visit: Payer: Self-pay

## 2017-12-02 DIAGNOSIS — C50919 Malignant neoplasm of unspecified site of unspecified female breast: Secondary | ICD-10-CM

## 2017-12-02 HISTORY — DX: Dermatitis, unspecified: L30.9

## 2017-12-02 HISTORY — DX: Heart failure, unspecified: I50.9

## 2017-12-02 NOTE — Telephone Encounter (Signed)
Spoke with pt about RT invasive mammary carcinoma with mets pathology results from breast bx. Mechele Claude from [facility] called me with report. Will refer to gen surg for mgmt. Discussed MyRisk testing due to personal and FH. Can either be done with gen surg or [place].    SURGICAL PATHOLOGY Surgical Pathology  CASE: [phone]  PATIENT: Mckenzie Lopez  Surgical Pathology Report      SPECIMEN SUBMITTED:  A. Breast, right 11:00 4 CMFN, biopsy  B. Breast, right 12:00 8 CMFN, biopsy  C. Lymph node, right axilla, biopsy  D. Lymph node, left axilla, biopsy   CLINICAL HISTORY:  Multiple suspicious masses right breast on screening mammo and  suspicious right axillary lymph node. Strong FH of breast cancer.   PRE-OPERATIVE DIAGNOSIS:  Suspect [place] (at 11:00 and 12:00) and a node metastasis on the right. On  left, the lymph node is indeterminate, favor benign   POST-OPERATIVE DIAGNOSIS:  None provided.      DIAGNOSIS:  A. BREAST, RIGHT, 11 O'CLOCK 4 CM FROM NIPPLE; ULTRASOUND-GUIDED CORE  BIOPSY:  - INVASIVE MAMMARY CARCINOMA, NO SPECIAL TYPE.  Size of invasive carcinoma: 9 mm in this sample  Histologic grade of invasive carcinoma: Grade 2            Glandular/tubular differentiation score: 3            Nuclear pleomorphism score: 2           Mitotic rate score: 1            Total score: 6   B. BREAST, RIGHT, 12 O'CLOCK, 8 CM FROM NIPPLE; ULTRASOUND-GUIDED CORE  BIOPSY:  - INVASIVE MAMMARY CARCINOMA, NO SPECIAL TYPE.  Size of invasive carcinoma: 5 mm in this sample  Histologic grade of invasive carcinoma: Grade 2            Glandular/tubular differentiation score: 3            Nuclear pleomorphism score: 2            Mitotic rate score: 1            Total score: 6   C. LYMPH NODE, RIGHT AXILLA; ULTRASOUND-GUIDED CORE BIOPSY:  - METASTATIC MAMMARY CARCINOMA, 7 MM, WITHOUT IDENTIFIABLE RESIDUAL   NODAL TISSUE.   D. LYMPH NODE, LEFT AXILLA; ULTRASOUND-GUIDED CORE BIOPSY:  - NEGATIVE FOR MALIGNANCY.   Comment:  The two breast tumors and the right axillary metastasis are  histologically similar. No ductal carcinoma in situ or lymphovascular  invasion is identified among these samples.   ER/PR/HER2: Immunohistochemistry will be performed on block A1, the  larger tumor, with reflex to [place] for HER2 2+. The results will be  reported in an addendum.   These findings were communicated to Dollene Cleveland in Dr. Theodosia Blender  office on 12/02/2017 at 2:26 PM. Read back procedure was performed.

## 2017-12-02 NOTE — Progress Notes (Signed)
Will post pone surgery for tubal ligation until more info surrounding breast cancer and its tx is understood.  She may need/benefit from BSO and so would not want to subject her to double surgeries.  Discussed w pt and she agrees to wait for now.  Barnett Applebaum, MD, [facility], [facility] 12/02/2017  4:22 PM

## 2017-12-02 NOTE — Patient Instructions (Signed)
Your procedure is scheduled on: 12/09/17 Report to Day Surgery. [facility] SECOND FLOOR To find out your arrival time please call [phone] between 1PM - 3PM on 12/08/17  Remember: Instructions that are not followed completely may result in serious medical risk,  up to and including death, or upon the discretion of your surgeon and anesthesiologist your  surgery may need to be rescheduled.     _X__ 1. Do not eat food after midnight the night before your procedure.                 No gum chewing or hard candies. You may drink clear liquids up to 2 hours                 before you are scheduled to arrive for your surgery- DO not drink clear                 liquids within 2 hours of the start of your surgery.                 Clear Liquids include:  water, apple juice without pulp, clear carbohydrate                 drink such as Clearfast of Gatorade, Black Coffee or Tea (Do not add                 anything to coffee or tea).  __X__2.  On the morning of surgery brush your teeth with toothpaste and water, you                may rinse your mouth with mouthwash if you wish.  Do not swallow any toothpaste of mouthwash.     _X__ 3.  No Alcohol for 24 hours before or after surgery.   _X__ 4.  Do Not Smoke or use e-cigarettes For 24 Hours Prior to Your Surgery.                 Do not use any chewable tobacco products for at least 6 hours prior to                 surgery.  ____  5.  Bring all medications with you on the day of surgery if instructed.   _X___  6.  Notify your doctor if there is any change in your medical condition      (cold, fever, infections).     Do not wear jewelry, make-up, hairpins, clips or nail polish. Do not wear lotions, powders, or perfumes. You may wear deodorant. Do not shave 48 hours prior to surgery. Men may shave face and neck. Do not bring valuables to the hospital.    [facility] is  not responsible for any belongings or valuables.  Contacts, dentures or bridgework may not be worn into surgery. Leave your suitcase in the car. After surgery it may be brought to your room. For patients admitted to the hospital, discharge time is determined by your treatment team.   Patients discharged the day of surgery will not be allowed to drive home.   Please read over the following fact sheets that you were given:   Surgical Site Infection Prevention / SPIROMETRY  __X__ Take these medicines the morning of surgery with  A SIP OF WATER:    1.HYDRALAZINE  2. LABETALOL  3. AMLODIPINE  4.  5.  6.  ____ Fleet Enema (as directed)   __X__ Use CHG Soap as directed  ____ Use inhalers on the day of surgery  ____ Stop metformin 2 days prior to surgery    ____ Take 1/2 of usual insulin dose the night before surgery. No insulin the morning          of surgery.   _X___ Stop Coumadin/Plavix/aspirin on    STOP ASPIRIN 1 WEEK BEFORE SURGERY  _X___ Stop Anti-inflammatories on   NONE UNTIL AFTER SURGERY. OK TO TAKE TYLENOL   ____ Stop supplements until after surgery.    ____ Bring C-Pap to the hospital.

## 2017-12-02 NOTE — Pre-Procedure Instructions (Signed)
DISCUSSED H/O OF POSTPARTUM PULMONARY EDEMA 2017/ RESOLVED.OK TO PROCEED BY DR Marcello Moores

## 2017-12-07 ENCOUNTER — Other Ambulatory Visit: Payer: BLUE CROSS/BLUE SHIELD

## 2017-12-07 ENCOUNTER — Ambulatory Visit: Payer: BLUE CROSS/BLUE SHIELD | Admitting: General Surgery

## 2017-12-07 ENCOUNTER — Encounter: Payer: Self-pay | Admitting: *Deleted

## 2017-12-07 VITALS — BP 132/82 | HR 86 | Resp 14 | Ht 65.0 in | Wt 269.0 lb

## 2017-12-07 DIAGNOSIS — C50411 Malignant neoplasm of upper-outer quadrant of right female breast: Secondary | ICD-10-CM

## 2017-12-07 DIAGNOSIS — Z170 Estrogen receptor positive status [ER+]: Secondary | ICD-10-CM

## 2017-12-07 NOTE — Progress Notes (Signed)
  Oncology Nurse Navigator Documentation  Navigator Location: [facility] (12/07/17 1700)   )Navigator Encounter Type: Clinic/MDC (12/07/17 1700)   Abnormal Finding Date: 11/26/17 (12/07/17 1700) Confirmed Diagnosis Date: 12/02/17 (12/07/17 1700)               Patient Visit Type: Walk-in (12/07/17 1700) Treatment Phase: Pre-Tx/Tx Discussion (12/07/17 1700) Barriers/Navigation Needs: Education (12/07/17 1700) Education: Newly Diagnosed Cancer Education (12/07/17 1700) Interventions: Education (12/07/17 1700)     Education Method: Verbal;Written (12/07/17 1700)  Support Groups/Services: Breast Support Group (12/07/17 1700)             Time Spent with Patient: 45 (12/07/17 1700)   Patient came by the cancer center today.  She had seen her lab appointment on MyChart, but did not see the canceled lab appointment.  Dr. Bary Castilla and I had discussed having the patient come by today for genetic testing but decided he would talk to her at her appointment this afternoon about genetic testing when discussing her surgical options.  Reviewed need for genetic testing with patient and her mom.  Discussed that she and Dr. Bary Castilla would be talking about this later today and could make her decision and testing could be completed at a later time.  Gave patient breast cancer educational literature, "My Breast Cancer Treatment Handbook" by Josephine Igo, RN.  She is to call if she has any questions or needs.

## 2017-12-07 NOTE — Progress Notes (Signed)
Patient ID: Mckenzie Lopez, female   DOB: [birth year], 32 y.o.   MRN: [id number]  Chief Complaint  Patient presents with  . Breast Problem    HPI Mckenzie Lopez is a 32 y.o. female.  who presents for a breast evaluation referred by Ardeth Perfect NP. The most recent mammogram and right breast biopsy and bilateral lymph nodes was done on 12-01-17.  Patient does perform regular self breast checks. This was her first mammogram done as a preventative due to family history.   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  The patient developed hypertension post delivery at 32 weeks and was unable to nurse.  She is here with her mother, Mckenzie Lopez, and her Mckenzie Lopez.  Bra size 44 DDD.  HPI  Past Medical History:  Diagnosis Date  . CHF (congestive heart failure) ([place])    POSTPARTUM 2017/ RESOLVED  . Eczema   . Hemoglobin C trait ([place]) 11/29/2015  . Hyperlipidemia   . Hypertension   . IFG (impaired fasting glucose)   . Low back pain   . Migraines   . Morbid obesity ([place])     Past Surgical History:  Procedure Laterality Date  . AXILLARY LYMPH NODE BIOPSY Right 12/01/2017   METASTATIC MAMMARY CARCINOMA  . AXILLARY LYMPH NODE BIOPSY Left 12/01/2017   NEGATIVE FOR MALIGNANCY  . BREAST BIOPSY Right 12/01/2017   11 and 12 o'clock, INVASIVE MAMMARY CARCINOMA, ER/PR positive HER2 negative    Family History  Problem Relation Age of Onset  . Diabetes Mother   . Hypertension Mother   . Thyroid disease Mother   . Breast cancer Maternal Grandmother        ?age  . Diabetes Maternal Grandfather   . Hypertension Maternal Grandfather   . Seizures Maternal Grandfather   . Breast cancer Maternal Aunt        early 7's  . Diabetes Sister   . Diabetes Paternal Grandmother   . Heart disease Neg Hx   . Stroke Neg Hx   . COPD Neg Hx   . Ovarian cancer Neg Hx     Social History Social  History   Tobacco Use  . Smoking status: Never Smoker  . Smokeless tobacco: Never Used  Substance Use Topics  . Alcohol use: No  . Drug use: No    Allergies  Allergen Reactions  . Chlorthalidone Other (See Comments)    Severe hypokalemia    Current Outpatient Medications  Medication Sig Dispense Refill  . amLODipine (NORVASC) 5 MG tablet TAKE 1 TABLET BY MOUTH  DAILY 90 tablet 1  . betamethasone dipropionate (DIPROLENE) 0.05 % ointment Apply 1 application topically 2 (two) times daily as needed. For rash on wrist  0  . Blood Pressure Monitoring (BLOOD PRESSURE MONITOR/L CUFF) MISC Large cuff; check BP; fluctuating blood pressures; HTN; LON 99 months 1 each 0  . EPIDUO FORTE 0.3-2.5 % GEL Apply 1 application topically at bedtime.  2  . fluticasone (FLONASE) 50 MCG/ACT nasal spray Place 2 sprays into both nostrils daily as needed. (Patient taking differently: Place 2 sprays into both nostrils daily as needed for allergies. ) 48 g 3  . hydrALAZINE (APRESOLINE) 50 MG tablet TAKE 1 TABLET BY MOUTH 3  TIMES DAILY 270 tablet 2  . hydrocortisone valerate cream (WESTCORT) 0.2 % Apply 1 application topically as needed (itching).     . Insulin Pen Needle (  PEN NEEDLES) 31G X 8 MM MISC Use with Saxenda pen; inject subcutaneously once a day 30 each 2  . labetalol (NORMODYNE) 200 MG tablet TAKE 1 TABLETS BY MOUTH 3  TIMES DAILY 270 tablet 1  . Olopatadine HCl 0.2 % SOLN One drop in each eye daily (Patient taking differently: Place 1 drop into both eyes daily as needed (for allergy eyes). ) 2.5 mL 11  . SAXENDA 18 MG/3ML SOPN INJECT 3 MG INTO THE SKIN DAILY 5 pen 1  . Cholecalciferol (VITAMIN D) 2000 UNITS CAPS Take 2,000 Units by mouth daily.     No current facility-administered medications for this visit.     Review of Systems Review of Systems  Constitutional: Negative.   Respiratory: Negative.   Cardiovascular: Negative.     Blood pressure 132/82, pulse 86, resp. rate 14, height '5\' 5"'$   (1.651 m), weight 269 lb (122 kg), last menstrual period 11/22/2017, SpO2 98 %.  Physical Exam Physical Exam  Constitutional: She is oriented to person, place, and time. She appears well-developed and well-nourished.  HENT:  Mouth/Throat: Oropharynx is clear and moist.  Eyes: Conjunctivae are normal. No scleral icterus.  Neck: Neck supple.  Cardiovascular: Normal rate, regular rhythm and normal heart sounds.  Pulmonary/Chest: Effort normal and breath sounds normal. Right breast exhibits no inverted nipple, no mass, no nipple discharge, no skin change and no tenderness. Left breast exhibits no inverted nipple, no mass, no nipple discharge, no skin change and no tenderness.    Lymphadenopathy:    She has no cervical adenopathy.    She has no axillary adenopathy.  Neurological: She is alert and oriented to person, place, and time.  Skin: Skin is warm and dry.  Psychiatric: Her behavior is normal.    Data Reviewed December 01, 2017 biopsies: A. BREAST, RIGHT, 11 O'CLOCK 4 CM FROM NIPPLE; ULTRASOUND-GUIDED CORE  BIOPSY:  - INVASIVE MAMMARY CARCINOMA, NO SPECIAL TYPE.  Size of invasive carcinoma: 9 mm in this sample  Histologic grade of invasive carcinoma: Grade 2            Glandular/tubular differentiation score: 3            Nuclear pleomorphism score: 2            Mitotic rate score: 1            Total score: 6   B. BREAST, RIGHT, 12 O'CLOCK, 8 CM FROM NIPPLE; ULTRASOUND-GUIDED CORE  BIOPSY:  - INVASIVE MAMMARY CARCINOMA, NO SPECIAL TYPE.  Size of invasive carcinoma: 5 mm in this sample  Histologic grade of invasive carcinoma: Grade 2            Glandular/tubular differentiation score: 3            Nuclear pleomorphism score: 2            Mitotic rate score: 1            Total score: 6   C. LYMPH NODE, RIGHT AXILLA; ULTRASOUND-GUIDED CORE BIOPSY:  - METASTATIC MAMMARY CARCINOMA, 7 MM, WITHOUT  IDENTIFIABLE RESIDUAL  NODAL TISSUE.   D. LYMPH NODE, LEFT AXILLA; ULTRASOUND-GUIDED CORE BIOPSY:  - NEGATIVE FOR MALIGNANCY.   BREAST BIOMARKER TESTS  Estrogen Receptor (ER) Status: POSITIVE  Percentage of cells with nuclear positivity: >90%  Average intensity of staining: Strong   Progesterone Receptor (PgR) Status: POSITIVE  Percentage of cells with nuclear positivity: >90%  Average intensity of staining: Strong   HER2 (by immunohistochemistry): NEGATIVE (0)  Mammograms of November 19, 2017 through December 01, 2017 reviewed.  Case presentation at [facility] breast tumor conference December 06, 2017.  Comprehensive metabolic panel dated March 09, 2017 was entirely Creatinine 0.7.  Hemoglobin A1c dated Kameren 30, 2018: 4.8.   Assessment    Multifocal carcinoma of the right breast.  Family history in second-degree relatives (maternal aunt, maternal grandmother).      Plan    The majority of the visit was spent reviewing the options for breast cancer treatment. Breast conservation with lumpectomy and radiation therapy  was presented as equivalent to mastectomy for Lopez-term control. The pros and cons of each treatment regimen were reviewed. The indications for additional therapy such as chemotherapy was discussed.  She would be a candidate for adjuvant chemotherapy based on the information available today, cT2, N1.the availability of second surgical opinion was discussed.  In spite of the multifocal disease in the right breast, these are all amenable to resection as a single block for breast preservation if the patient so desires.  Based on the large positive axillary node and no clear indication for neoadjuvant chemotherapy, I think the patient would be best served\dissection.  The risks associated with postoperative swelling of the arm were reviewed.  Breast MRI prior to surgical intervention has been recommended to confirm no additional lesions in the right breast.  The patient has a  low-grade tumor, and neoadjuvant chemotherapy has not been recommended based on last night's tumor conference.  Strong indication for BRCA testing as this would directly impact management of the breast if positive.  Informational brochure provided.  At this time, the patient is leaning towards breast conservation, and we reviewed that negative margins needed to be achieved and if additional lesions were identified on MRI would have to have a Lopez discussion whether these should be biopsied or she should proceed directly to mastectomy.  Should she require mastectomy/desire mastectomy I would recommend delayed reconstruction.  She is going to be a candidate for adjuvant chemotherapy based on her large positive axillary node regardless of additional findings.  At this time, radiation oncology does not think that she is a mandated candidate for postmastectomy radiation, although this would be a possibility if the 3 individual lesions are indeed the dominant face of a larger single mass.  If she were to have multiple (greater than 3) axillary nodes radiation of this area would certainly be of interest and recommended.  We discussed having BRCA testing completed through the cancer center with a 4-5 days turnaround but the potential cost of 250-$300 versus the 10 to 14-day turnaround with her employer which would be at no cost.  At this time, she is going to have the BRCA testing through her employer.  We will arrange for a breast MRI and further assessment after that study is available.  The patient has been encouraged to call should she have any questions.     HPI, Physical Exam, Assessment and Plan have been scribed under the direction and in the presence of Robert Bellow, MD. Karie Fetch, RN  I have completed the exam and reviewed the above documentation for accuracy and completeness.  I agree with the above.  Haematologist has been used and any errors in dictation or transcription are  unintentional.  Hervey Ard, M.D., F.A.C.S.  Mckenzie Lopez 12/07/2017, 9:13 PM

## 2017-12-07 NOTE — Patient Instructions (Signed)
The patient is aware to call back for any questions or concerns.  

## 2017-12-08 ENCOUNTER — Other Ambulatory Visit: Payer: Self-pay

## 2017-12-08 ENCOUNTER — Telehealth: Payer: Self-pay

## 2017-12-08 DIAGNOSIS — C50411 Malignant neoplasm of upper-outer quadrant of right female breast: Secondary | ICD-10-CM

## 2017-12-08 DIAGNOSIS — Z170 Estrogen receptor positive status [ER+]: Principal | ICD-10-CM

## 2017-12-08 LAB — SURGICAL PATHOLOGY

## 2017-12-08 NOTE — Telephone Encounter (Signed)
-----  Message from Robert Bellow, MD sent at 12/07/2017  9:12 PM EDT ----- Please arrange for the patient to have BRCA 1/2 testing completed at [facility] as soon as possible.  Arrange for bilateral breast MRI: Diagnosis multifocal right breast cancer.

## 2017-12-08 NOTE — Telephone Encounter (Signed)
Spoke with the patient about BRCA testing. She will pick up the order form tomorrow and have this done at [facility]. She is also aware that [facility] will be contacting her to schedule her Breast MRI. She is aware to call an let us know the date so we may get insurance authorization.

## 2017-12-09 ENCOUNTER — Encounter: Admission: RE | Payer: Self-pay | Source: Ambulatory Visit

## 2017-12-09 ENCOUNTER — Ambulatory Visit
Admission: RE | Admit: 2017-12-09 | Payer: BLUE CROSS/BLUE SHIELD | Source: Ambulatory Visit | Admitting: Obstetrics & Gynecology

## 2017-12-09 ENCOUNTER — Encounter: Payer: Self-pay | Admitting: Obstetrics and Gynecology

## 2017-12-09 ENCOUNTER — Other Ambulatory Visit: Payer: Self-pay | Admitting: General Surgery

## 2017-12-09 DIAGNOSIS — C50411 Malignant neoplasm of upper-outer quadrant of right female breast: Secondary | ICD-10-CM | POA: Diagnosis not present

## 2017-12-09 DIAGNOSIS — Z170 Estrogen receptor positive status [ER+]: Secondary | ICD-10-CM | POA: Diagnosis not present

## 2017-12-09 SURGERY — SALPINGECTOMY, BILATERAL, LAPAROSCOPIC
Anesthesia: Choice

## 2017-12-09 NOTE — Telephone Encounter (Signed)
Pt needs paperwork completed for cancer dx. She needs to give it to Dr. Dwyane Luo office.

## 2017-12-13 ENCOUNTER — Encounter: Payer: Self-pay | Admitting: Obstetrics and Gynecology

## 2017-12-14 DIAGNOSIS — Z170 Estrogen receptor positive status [ER+]: Secondary | ICD-10-CM | POA: Diagnosis not present

## 2017-12-14 DIAGNOSIS — C50811 Malignant neoplasm of overlapping sites of right female breast: Secondary | ICD-10-CM | POA: Diagnosis not present

## 2017-12-16 ENCOUNTER — Telehealth: Payer: Self-pay

## 2017-12-16 NOTE — Telephone Encounter (Signed)
Pt called triage line, states she needs help with some paper work and [facility] told her to call [employer]

## 2017-12-20 ENCOUNTER — Encounter: Payer: Self-pay | Admitting: Family Medicine

## 2017-12-20 ENCOUNTER — Ambulatory Visit: Payer: BLUE CROSS/BLUE SHIELD | Admitting: Family Medicine

## 2017-12-20 VITALS — BP 126/84 | HR 90 | Temp 97.6°F | Resp 14 | Ht 65.0 in | Wt 264.9 lb

## 2017-12-20 DIAGNOSIS — E782 Mixed hyperlipidemia: Secondary | ICD-10-CM | POA: Diagnosis not present

## 2017-12-20 DIAGNOSIS — N6311 Unspecified lump in the right breast, upper outer quadrant: Secondary | ICD-10-CM | POA: Diagnosis not present

## 2017-12-20 DIAGNOSIS — N6310 Unspecified lump in the right breast, unspecified quadrant: Secondary | ICD-10-CM | POA: Diagnosis not present

## 2017-12-20 DIAGNOSIS — C50411 Malignant neoplasm of upper-outer quadrant of right female breast: Secondary | ICD-10-CM

## 2017-12-20 DIAGNOSIS — I10 Essential (primary) hypertension: Secondary | ICD-10-CM

## 2017-12-20 DIAGNOSIS — D75839 Thrombocytosis, unspecified: Secondary | ICD-10-CM

## 2017-12-20 DIAGNOSIS — Z5181 Encounter for therapeutic drug level monitoring: Secondary | ICD-10-CM

## 2017-12-20 DIAGNOSIS — R7301 Impaired fasting glucose: Secondary | ICD-10-CM | POA: Diagnosis not present

## 2017-12-20 DIAGNOSIS — D473 Essential (hemorrhagic) thrombocythemia: Secondary | ICD-10-CM

## 2017-12-20 DIAGNOSIS — E559 Vitamin D deficiency, unspecified: Secondary | ICD-10-CM | POA: Diagnosis not present

## 2017-12-20 DIAGNOSIS — Z170 Estrogen receptor positive status [ER+]: Secondary | ICD-10-CM | POA: Diagnosis not present

## 2017-12-20 DIAGNOSIS — C50811 Malignant neoplasm of overlapping sites of right female breast: Secondary | ICD-10-CM | POA: Diagnosis not present

## 2017-12-20 NOTE — Assessment & Plan Note (Signed)
Check labs 

## 2017-12-20 NOTE — Telephone Encounter (Signed)
Pt routed [facility]

## 2017-12-20 NOTE — Assessment & Plan Note (Signed)
Check lipids today 

## 2017-12-20 NOTE — Assessment & Plan Note (Signed)
Check glucose and A1c 

## 2017-12-20 NOTE — Assessment & Plan Note (Signed)
Likely inflammatory response from obesity

## 2017-12-20 NOTE — Patient Instructions (Signed)
Check out the information at familydoctor.org entitled "Nutrition for Weight Loss: What You Need to Know about Fad Diets" Try to lose between 1-2 pounds per week by taking in fewer calories and burning off more calories You can succeed by limiting portions, limiting foods dense in calories and fat, becoming more active, and drinking 8 glasses of water a day (64 ounces) Don't skip meals, especially breakfast, as skipping meals may alter your metabolism Do not use over-the-counter weight loss pills or gimmicks that claim rapid weight loss A healthy BMI (or body mass index) is between 18.5 and 24.9 You can calculate your ideal BMI at the NIH website http://www.nhlbi.nih.gov/health/educational/lose_wt/BMI/bmicalc.htm Try to follow the DASH guidelines (DASH stands for Dietary Approaches to Stop Hypertension). Try to limit the sodium in your diet to no more than 1,500mg of sodium per day. Certainly try to not exceed 2,000 mg per day at the very most. Do not add salt when cooking or at the table.  Check the sodium amount on labels when shopping, and choose items lower in sodium when given a choice. Avoid or limit foods that already contain a lot of sodium. Eat a diet rich in fruits and vegetables and whole grains, and try to lose weight if overweight or obese  

## 2017-12-20 NOTE — Assessment & Plan Note (Signed)
Being worked up at [facility], exploring her options

## 2017-12-20 NOTE — Assessment & Plan Note (Signed)
Controlled today; monitor Cr; try to follow DASH guidelines

## 2017-12-20 NOTE — Progress Notes (Signed)
BP 126/84   Pulse 90   Temp 97.6 F (36.4 C) (Oral)   Resp 14   Ht _0  (1.651 m)   Wt 264 lb 14.4 oz (120.2 kg)   LMP 12/14/2017   SpO2 97%   BMI 44.08 kg/m    Subjective:    Patient ID: Mckenzie Lopez, female    DOB: 04/24/1986, 32 y.o.   MRN: [id number]  HPI: Mckenzie Lopez is a 32 y.o. female  Chief Complaint  Patient presents with  . Follow-up    HPI Patient is here for f/u  She has been diagnosed with breast cancer since the last visit She is going to have an MRI and go through [place]; there is a family hx of breast cancer (maternal aunt and grandmother; grandmother died early in her 76s or 3s, not sure)  Her blood pressure is controlled today; taking multiple meds; trying to eat better and low weight  Morbid obesity; she has lost 5 pounds since the last recorded weight in [facility]; she is still doing the baked foods; not frying foods much at all; just every blue moon; grease not agreeing with her stomach, so just grilled and baked foods; more walking; drinking more water, very little juice  Prediabetes; no dry mouth ; no blurred vision; mother has diabetes and grandfather  Hyperlipidemia; baking and grilling her food instead of frying; one egg a week  Depression screen [facility] 2/9 12/20/2017 09/16/2017 07/28/2017 01/28/2017 12/01/2016  Decreased Interest 0 0 0 0 0  Down, Depressed, Hopeless 0 0 0 0 0  PHQ - 2 Score 0 0 0 0 0    Relevant past medical, surgical, family and social history reviewed Past Medical History:  Diagnosis Date  . Cancer ([place]) 07/252019   breast  . CHF (congestive heart failure) ([place])    POSTPARTUM 2017/ RESOLVED  . Eczema   . Hemoglobin C trait ([place]) 11/29/2015  . Hyperlipidemia   . Hypertension   . IFG (impaired fasting glucose)   . Low back pain   . Migraines   . Morbid obesity ([place])    Past Surgical History:  Procedure Laterality Date  . AXILLARY LYMPH NODE BIOPSY Right 12/01/2017   METASTATIC MAMMARY CARCINOMA  .  AXILLARY LYMPH NODE BIOPSY Left 12/01/2017   NEGATIVE FOR MALIGNANCY  . BREAST BIOPSY Right 12/01/2017   11 and 12 o'clock, INVASIVE MAMMARY CARCINOMA, ER/PR positive HER2 negative   Family History  Problem Relation Age of Onset  . Diabetes Mother   . Hypertension Mother   . Thyroid disease Mother   . Breast cancer Maternal Grandmother        ?age  . Diabetes Maternal Grandfather   . Hypertension Maternal Grandfather   . Seizures Maternal Grandfather   . Breast cancer Maternal Aunt        early 59's  . Diabetes Sister   . Diabetes Paternal Grandmother   . Heart disease Neg Hx   . Stroke Neg Hx   . COPD Neg Hx   . Ovarian cancer Neg Hx    Social History   Tobacco Use  . Smoking status: Never Smoker  . Smokeless tobacco: Never Used  Substance Use Topics  . Alcohol use: No  . Drug use: No    Interim medical history since last visit reviewed. Allergies and medications reviewed  Review of Systems Per HPI unless specifically indicated above     Objective:    BP 126/84   Pulse 90  Temp 97.6 F (36.4 C) (Oral)   Resp 14   Ht _0  (1.651 m)   Wt 264 lb 14.4 oz (120.2 kg)   LMP 12/14/2017   SpO2 97%   BMI 44.08 kg/m   Wt Readings from Last 3 Encounters:  12/20/17 264 lb 14.4 oz (120.2 kg)  12/07/17 269 lb (122 kg)  12/02/17 265 lb (120.2 kg)    Physical Exam  Constitutional: She appears well-developed and well-nourished.  obese  HENT:  Mouth/Throat: Mucous membranes are normal.  Eyes: EOM are normal. No scleral icterus.  Cardiovascular: Normal rate and regular rhythm.  Pulmonary/Chest: Effort normal and breath sounds normal.  Abdominal: She exhibits no distension.  Musculoskeletal: She exhibits no edema.  Psychiatric: She has a normal mood and affect. Her behavior is normal.    Results for orders placed or performed in visit on 12/08/17  [facility] Comprehensive Test  Result Value Ref Range   PREAUTHORIZATION WILL FOLLOW    EXTRACTION Comment         Assessment & Plan:   Problem List Items Addressed This Visit      Cardiovascular and Mediastinum   Hypertension - Primary (Chronic)    Controlled today; monitor Cr; try to follow DASH guidelines        Endocrine   IFG (impaired fasting glucose) (Chronic)    Check glucose and A1c      Relevant Orders   Lipid panel   Hemoglobin A1c     Hematopoietic and Hemostatic   Thrombocytosis (HCC) (Chronic)    Likely inflammatory response from obesity      Relevant Orders   CBC with Differential/Platelet     Other   Vitamin D deficiency    Check labs      Relevant Orders   VITAMIN D 25 Hydroxy (Vit-D Deficiency, Fractures)   Morbid obesity (HCC) (Chronic)    Encouragement given; she is down five pounds from July 30th; down seventeen pounds since May 9th      Malignant neoplasm of upper-outer quadrant of right breast in female, estrogen receptor positive ([place])    Being worked up at [facility], exploring her options      Hyperlipidemia    Check lipids today      Relevant Orders   Lipid panel    Other Visit Diagnoses    Medication monitoring encounter       Relevant Orders   COMPLETE METABOLIC PANEL WITH GFR       Follow up plan: Return in about 6 months (around 06/22/2018) for twenty minute follow-up with fasting labs.  An after-visit summary was printed and given to the patient at [facility].  Please see the patient instructions which may contain other information and recommendations beyond what is mentioned above in the assessment and plan.  No orders of the defined types were placed in this encounter.   Orders Placed This Encounter  Procedures  . Lipid panel  . Hemoglobin A1c  . COMPLETE METABOLIC PANEL WITH GFR  . CBC with Differential/Platelet  . VITAMIN D 25 Hydroxy (Vit-D Deficiency, Fractures)

## 2017-12-20 NOTE — Assessment & Plan Note (Signed)
Encouragement given; she is down five pounds from July 30th; down seventeen pounds since May 9th

## 2017-12-21 ENCOUNTER — Encounter: Payer: Self-pay | Admitting: *Deleted

## 2017-12-21 LAB — COMPLETE METABOLIC PANEL WITH GFR
AG RATIO: 1.8 (calc) (ref 1.0–2.5)
ALKALINE PHOSPHATASE (APISO): 43 U/L (ref 33–115)
ALT: 9 U/L (ref 6–29)
AST: 10 U/L (ref 10–30)
Albumin: 4.4 g/dL (ref 3.6–5.1)
BUN: 10 mg/dL (ref 7–25)
CO2: 24 mmol/L (ref 20–32)
CREATININE: 0.81 mg/dL (ref 0.50–1.10)
Calcium: 9.2 mg/dL (ref 8.6–10.2)
Chloride: 106 mmol/L (ref 98–110)
GFR, Est African American: 110 mL/min/{1.73_m2} (ref >=60)
GFR, Est Non African American: 95 mL/min/{1.73_m2} (ref >=60)
GLOBULIN: 2.5 g/dL (ref 1.9–3.7)
Glucose, Bld: 80 mg/dL (ref 65–99)
POTASSIUM: 3.8 mmol/L (ref 3.5–5.3)
SODIUM: 138 mmol/L (ref 135–146)
Total Bilirubin: 0.3 mg/dL (ref 0.2–1.2)
Total Protein: 6.9 g/dL (ref 6.1–8.1)

## 2017-12-21 LAB — LIPID PANEL
CHOL/HDL RATIO: 3.8 (calc) (ref <5.0)
CHOLESTEROL: 185 mg/dL (ref <200)
HDL: 49 mg/dL — ABNORMAL LOW (ref >50)
LDL CHOLESTEROL (CALC): 122 mg/dL — AB
Non-HDL Cholesterol (Calc): 136 mg/dL (calc) — ABNORMAL HIGH (ref <130)
Triglycerides: 58 mg/dL (ref <150)

## 2017-12-21 LAB — CBC WITH DIFFERENTIAL/PLATELET
Basophils Absolute: 60 cells/uL (ref 0–200)
Basophils Relative: 1.3 %
EOS PCT: 2.4 %
Eosinophils Absolute: 110 cells/uL (ref 15–500)
HCT: 35 % (ref 35.0–45.0)
Hemoglobin: 12.2 g/dL (ref 11.7–15.5)
Lymphs Abs: 1467 cells/uL (ref 850–3900)
MCH: 27.9 pg (ref 27.0–33.0)
MCHC: 34.9 g/dL (ref 32.0–36.0)
MCV: 80.1 fL (ref 80.0–100.0)
MONOS PCT: 6.3 %
MPV: 8.5 fL (ref 7.5–12.5)
Neutro Abs: 2673 cells/uL (ref 1500–7800)
Neutrophils Relative %: 58.1 %
Platelets: 536 10*3/uL — ABNORMAL HIGH (ref 140–400)
RBC: 4.37 10*6/uL (ref 3.80–5.10)
RDW: 14.2 % (ref 11.0–15.0)
TOTAL LYMPHOCYTE: 31.9 %
WBC mixed population: 290 cells/uL (ref 200–950)
WBC: 4.6 10*3/uL (ref 3.8–10.8)

## 2017-12-21 LAB — VITAMIN D 25 HYDROXY (VIT D DEFICIENCY, FRACTURES): VIT D 25 HYDROXY: 27 ng/mL — AB (ref 30–100)

## 2017-12-21 LAB — HEMOGLOBIN A1C
EAG (MMOL/L): 5 (calc)
HEMOGLOBIN A1C: 4.8 %{Hb} (ref <5.7)
Mean Plasma Glucose: 91 (calc)

## 2017-12-21 NOTE — Progress Notes (Signed)
  Oncology Nurse Navigator Documentation  Navigator Location: [facility] (12/21/17 1600)   )Navigator Encounter Type: Telephone (12/21/17 1600) Telephone: Outgoing Call (12/21/17 1600)                                                  Time Spent with Patient: 15 (12/21/17 1600)   Called patient today to follow up on where she is at in regards to her treatment plan.  States she has also been to [facility] for a second opinion and still pending her MRI.  States she will let me know when she has decided where she plans to go for treatment.

## 2017-12-22 DIAGNOSIS — C50911 Malignant neoplasm of unspecified site of right female breast: Secondary | ICD-10-CM | POA: Diagnosis not present

## 2017-12-22 DIAGNOSIS — N6312 Unspecified lump in the right breast, upper inner quadrant: Secondary | ICD-10-CM | POA: Diagnosis not present

## 2017-12-22 DIAGNOSIS — N6311 Unspecified lump in the right breast, upper outer quadrant: Secondary | ICD-10-CM | POA: Diagnosis not present

## 2017-12-22 DIAGNOSIS — C50411 Malignant neoplasm of upper-outer quadrant of right female breast: Secondary | ICD-10-CM | POA: Diagnosis not present

## 2017-12-22 DIAGNOSIS — Z170 Estrogen receptor positive status [ER+]: Secondary | ICD-10-CM | POA: Diagnosis not present

## 2017-12-22 DIAGNOSIS — C50811 Malignant neoplasm of overlapping sites of right female breast: Secondary | ICD-10-CM | POA: Diagnosis not present

## 2017-12-22 DIAGNOSIS — Z9889 Other specified postprocedural states: Secondary | ICD-10-CM | POA: Diagnosis not present

## 2017-12-23 ENCOUNTER — Ambulatory Visit: Payer: BLUE CROSS/BLUE SHIELD | Admitting: Obstetrics & Gynecology

## 2017-12-28 DIAGNOSIS — I517 Cardiomegaly: Secondary | ICD-10-CM | POA: Diagnosis not present

## 2017-12-28 DIAGNOSIS — I10 Essential (primary) hypertension: Secondary | ICD-10-CM | POA: Diagnosis not present

## 2017-12-28 DIAGNOSIS — Z170 Estrogen receptor positive status [ER+]: Secondary | ICD-10-CM | POA: Diagnosis not present

## 2017-12-28 DIAGNOSIS — C50811 Malignant neoplasm of overlapping sites of right female breast: Secondary | ICD-10-CM | POA: Diagnosis not present

## 2017-12-28 DIAGNOSIS — D582 Other hemoglobinopathies: Secondary | ICD-10-CM | POA: Diagnosis not present

## 2017-12-31 DIAGNOSIS — I517 Cardiomegaly: Secondary | ICD-10-CM | POA: Diagnosis not present

## 2017-12-31 DIAGNOSIS — C50811 Malignant neoplasm of overlapping sites of right female breast: Secondary | ICD-10-CM | POA: Diagnosis not present

## 2017-12-31 DIAGNOSIS — Z170 Estrogen receptor positive status [ER+]: Secondary | ICD-10-CM | POA: Diagnosis not present

## 2018-01-05 DIAGNOSIS — Z79899 Other long term (current) drug therapy: Secondary | ICD-10-CM | POA: Diagnosis not present

## 2018-01-05 DIAGNOSIS — Z170 Estrogen receptor positive status [ER+]: Secondary | ICD-10-CM | POA: Diagnosis not present

## 2018-01-05 DIAGNOSIS — C50811 Malignant neoplasm of overlapping sites of right female breast: Secondary | ICD-10-CM | POA: Diagnosis not present

## 2018-01-05 DIAGNOSIS — C773 Secondary and unspecified malignant neoplasm of axilla and upper limb lymph nodes: Secondary | ICD-10-CM | POA: Diagnosis not present

## 2018-01-05 DIAGNOSIS — Z7982 Long term (current) use of aspirin: Secondary | ICD-10-CM | POA: Diagnosis not present

## 2018-01-05 DIAGNOSIS — C50411 Malignant neoplasm of upper-outer quadrant of right female breast: Secondary | ICD-10-CM | POA: Diagnosis not present

## 2018-01-05 DIAGNOSIS — I110 Hypertensive heart disease with heart failure: Secondary | ICD-10-CM | POA: Diagnosis not present

## 2018-01-05 DIAGNOSIS — D0511 Intraductal carcinoma in situ of right breast: Secondary | ICD-10-CM | POA: Diagnosis not present

## 2018-01-05 DIAGNOSIS — Z6841 Body Mass Index (BMI) 40.0 and over, adult: Secondary | ICD-10-CM | POA: Diagnosis not present

## 2018-01-05 DIAGNOSIS — I509 Heart failure, unspecified: Secondary | ICD-10-CM | POA: Diagnosis not present

## 2018-01-05 DIAGNOSIS — Z794 Long term (current) use of insulin: Secondary | ICD-10-CM | POA: Diagnosis not present

## 2018-01-05 DIAGNOSIS — Z803 Family history of malignant neoplasm of breast: Secondary | ICD-10-CM | POA: Diagnosis not present

## 2018-01-05 HISTORY — PX: MASTECTOMY: SHX3

## 2018-01-06 DIAGNOSIS — I110 Hypertensive heart disease with heart failure: Secondary | ICD-10-CM | POA: Diagnosis not present

## 2018-01-06 DIAGNOSIS — Z170 Estrogen receptor positive status [ER+]: Secondary | ICD-10-CM | POA: Diagnosis not present

## 2018-01-06 DIAGNOSIS — Z794 Long term (current) use of insulin: Secondary | ICD-10-CM | POA: Diagnosis not present

## 2018-01-06 DIAGNOSIS — Z803 Family history of malignant neoplasm of breast: Secondary | ICD-10-CM | POA: Diagnosis not present

## 2018-01-06 DIAGNOSIS — I509 Heart failure, unspecified: Secondary | ICD-10-CM | POA: Diagnosis not present

## 2018-01-06 DIAGNOSIS — Z79899 Other long term (current) drug therapy: Secondary | ICD-10-CM | POA: Diagnosis not present

## 2018-01-06 DIAGNOSIS — C773 Secondary and unspecified malignant neoplasm of axilla and upper limb lymph nodes: Secondary | ICD-10-CM | POA: Diagnosis not present

## 2018-01-06 DIAGNOSIS — C50811 Malignant neoplasm of overlapping sites of right female breast: Secondary | ICD-10-CM | POA: Diagnosis not present

## 2018-01-06 DIAGNOSIS — Z7982 Long term (current) use of aspirin: Secondary | ICD-10-CM | POA: Diagnosis not present

## 2018-01-12 ENCOUNTER — Other Ambulatory Visit: Payer: Self-pay | Admitting: Family Medicine

## 2018-01-13 ENCOUNTER — Other Ambulatory Visit: Payer: Self-pay | Admitting: Nurse Practitioner

## 2018-01-13 ENCOUNTER — Encounter: Payer: Self-pay | Admitting: Family Medicine

## 2018-01-14 ENCOUNTER — Other Ambulatory Visit: Payer: Self-pay | Admitting: Family Medicine

## 2018-01-18 LAB — COMPREHENSIVE BRCA1/2 ANALYSIS

## 2018-01-18 LAB — BRCASSURE COMPREHENSIVE TEST

## 2018-01-18 MED ORDER — AMLODIPINE BESYLATE 5 MG PO TABS: 5.0000 mg | ORAL_TABLET | Freq: Every day | ORAL | 3 refills | Status: DC

## 2018-01-19 ENCOUNTER — Ambulatory Visit: Payer: BLUE CROSS/BLUE SHIELD | Admitting: Obstetrics & Gynecology

## 2018-01-20 DIAGNOSIS — Z170 Estrogen receptor positive status [ER+]: Secondary | ICD-10-CM | POA: Diagnosis not present

## 2018-01-20 DIAGNOSIS — C50811 Malignant neoplasm of overlapping sites of right female breast: Secondary | ICD-10-CM | POA: Diagnosis not present

## 2018-01-20 DIAGNOSIS — Z9011 Acquired absence of right breast and nipple: Secondary | ICD-10-CM | POA: Diagnosis not present

## 2018-01-25 DIAGNOSIS — C50811 Malignant neoplasm of overlapping sites of right female breast: Secondary | ICD-10-CM | POA: Diagnosis not present

## 2018-01-25 DIAGNOSIS — Z170 Estrogen receptor positive status [ER+]: Secondary | ICD-10-CM | POA: Diagnosis not present

## 2018-01-28 DIAGNOSIS — Z170 Estrogen receptor positive status [ER+]: Secondary | ICD-10-CM | POA: Diagnosis not present

## 2018-01-28 DIAGNOSIS — C50811 Malignant neoplasm of overlapping sites of right female breast: Secondary | ICD-10-CM | POA: Diagnosis not present

## 2018-01-31 DIAGNOSIS — Z5111 Encounter for antineoplastic chemotherapy: Secondary | ICD-10-CM | POA: Diagnosis not present

## 2018-01-31 DIAGNOSIS — Z170 Estrogen receptor positive status [ER+]: Secondary | ICD-10-CM | POA: Diagnosis not present

## 2018-01-31 DIAGNOSIS — C50811 Malignant neoplasm of overlapping sites of right female breast: Secondary | ICD-10-CM | POA: Diagnosis not present

## 2018-02-01 ENCOUNTER — Telehealth: Payer: Self-pay | Admitting: *Deleted

## 2018-02-01 DIAGNOSIS — C50411 Malignant neoplasm of upper-outer quadrant of right female breast: Secondary | ICD-10-CM

## 2018-02-01 DIAGNOSIS — Z170 Estrogen receptor positive status [ER+]: Principal | ICD-10-CM

## 2018-02-01 DIAGNOSIS — C50811 Malignant neoplasm of overlapping sites of right female breast: Secondary | ICD-10-CM | POA: Diagnosis not present

## 2018-02-01 NOTE — Telephone Encounter (Signed)
Donia Guiles from [facility] called regarding Mckenzie Lopez 04-15-1986 genetic testing. She wants to see about ordering more to the genetic testing. [place] number is [phone]

## 2018-02-01 NOTE — Telephone Encounter (Signed)
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 Test # [id number] vistaseq.

## 2018-02-06 NOTE — Telephone Encounter (Signed)
[facility] can order the additional testing they desire. That way, results go directly to them and we are not responsible for interpretation/ analysis.

## 2018-02-08 NOTE — Telephone Encounter (Signed)
[facility] is not contracted with [facility]. Patient is a Armed forces logistics/support/administrative officer.

## 2018-02-08 NOTE — Telephone Encounter (Signed)
Order faxed for additional testing.

## 2018-02-14 ENCOUNTER — Other Ambulatory Visit: Payer: Self-pay | Admitting: Family Medicine

## 2018-02-14 DIAGNOSIS — Z23 Encounter for immunization: Secondary | ICD-10-CM | POA: Diagnosis not present

## 2018-02-14 DIAGNOSIS — C50811 Malignant neoplasm of overlapping sites of right female breast: Secondary | ICD-10-CM | POA: Diagnosis not present

## 2018-02-14 DIAGNOSIS — Z5111 Encounter for antineoplastic chemotherapy: Secondary | ICD-10-CM | POA: Diagnosis not present

## 2018-02-14 DIAGNOSIS — C773 Secondary and unspecified malignant neoplasm of axilla and upper limb lymph nodes: Secondary | ICD-10-CM | POA: Diagnosis not present

## 2018-02-14 DIAGNOSIS — Z170 Estrogen receptor positive status [ER+]: Secondary | ICD-10-CM | POA: Diagnosis not present

## 2018-02-16 DIAGNOSIS — Z170 Estrogen receptor positive status [ER+]: Secondary | ICD-10-CM | POA: Diagnosis not present

## 2018-02-16 DIAGNOSIS — C50811 Malignant neoplasm of overlapping sites of right female breast: Secondary | ICD-10-CM | POA: Diagnosis not present

## 2018-02-28 DIAGNOSIS — Z5111 Encounter for antineoplastic chemotherapy: Secondary | ICD-10-CM | POA: Diagnosis not present

## 2018-02-28 DIAGNOSIS — Z9011 Acquired absence of right breast and nipple: Secondary | ICD-10-CM | POA: Diagnosis not present

## 2018-02-28 DIAGNOSIS — I10 Essential (primary) hypertension: Secondary | ICD-10-CM | POA: Diagnosis not present

## 2018-02-28 DIAGNOSIS — C773 Secondary and unspecified malignant neoplasm of axilla and upper limb lymph nodes: Secondary | ICD-10-CM | POA: Diagnosis not present

## 2018-02-28 DIAGNOSIS — C50111 Malignant neoplasm of central portion of right female breast: Secondary | ICD-10-CM | POA: Diagnosis not present

## 2018-02-28 DIAGNOSIS — R197 Diarrhea, unspecified: Secondary | ICD-10-CM | POA: Diagnosis not present

## 2018-02-28 DIAGNOSIS — I972 Postmastectomy lymphedema syndrome: Secondary | ICD-10-CM | POA: Diagnosis not present

## 2018-02-28 DIAGNOSIS — I890 Lymphedema, not elsewhere classified: Secondary | ICD-10-CM | POA: Diagnosis not present

## 2018-02-28 DIAGNOSIS — C50811 Malignant neoplasm of overlapping sites of right female breast: Secondary | ICD-10-CM | POA: Diagnosis not present

## 2018-02-28 DIAGNOSIS — Z4431 Encounter for fitting and adjustment of external right breast prosthesis: Secondary | ICD-10-CM | POA: Diagnosis not present

## 2018-02-28 DIAGNOSIS — Z170 Estrogen receptor positive status [ER+]: Secondary | ICD-10-CM | POA: Diagnosis not present

## 2018-03-02 DIAGNOSIS — C50811 Malignant neoplasm of overlapping sites of right female breast: Secondary | ICD-10-CM | POA: Diagnosis not present

## 2018-03-02 DIAGNOSIS — Z170 Estrogen receptor positive status [ER+]: Secondary | ICD-10-CM | POA: Diagnosis not present

## 2018-03-03 LAB — VISTASEQ HERED CANCER W/O BRCA: RESULT SUMMARY, VISTASEQ: NEGATIVE

## 2018-03-04 DIAGNOSIS — L640 Drug-induced androgenic alopecia: Secondary | ICD-10-CM | POA: Diagnosis not present

## 2018-03-04 DIAGNOSIS — R29898 Other symptoms and signs involving the musculoskeletal system: Secondary | ICD-10-CM | POA: Diagnosis not present

## 2018-03-04 DIAGNOSIS — I972 Postmastectomy lymphedema syndrome: Secondary | ICD-10-CM | POA: Diagnosis not present

## 2018-03-14 DIAGNOSIS — C50811 Malignant neoplasm of overlapping sites of right female breast: Secondary | ICD-10-CM | POA: Diagnosis not present

## 2018-03-14 DIAGNOSIS — Z5111 Encounter for antineoplastic chemotherapy: Secondary | ICD-10-CM | POA: Diagnosis not present

## 2018-03-14 DIAGNOSIS — Z170 Estrogen receptor positive status [ER+]: Secondary | ICD-10-CM | POA: Diagnosis not present

## 2018-03-17 ENCOUNTER — Ambulatory Visit: Payer: BLUE CROSS/BLUE SHIELD | Attending: Physician Assistant | Admitting: Occupational Therapy

## 2018-03-17 ENCOUNTER — Other Ambulatory Visit: Payer: Self-pay

## 2018-03-17 DIAGNOSIS — G8929 Other chronic pain: Secondary | ICD-10-CM | POA: Insufficient documentation

## 2018-03-17 DIAGNOSIS — M25611 Stiffness of right shoulder, not elsewhere classified: Secondary | ICD-10-CM | POA: Diagnosis not present

## 2018-03-17 DIAGNOSIS — M5441 Lumbago with sciatica, right side: Secondary | ICD-10-CM | POA: Insufficient documentation

## 2018-03-17 DIAGNOSIS — I972 Postmastectomy lymphedema syndrome: Secondary | ICD-10-CM | POA: Diagnosis not present

## 2018-03-17 NOTE — Patient Instructions (Signed)
Cont to wear daysleeve and glove  And night time isotoner glove and tubigrip soft on hand to elbow    R shoulder AAROM on wall for flexion and ABD   wall slides  10 reps 1-2 x day  Scapula scqueezes - 10 reps  5 x day  Hand out provided and review with pt  For Right Arm:  Felicie Morn yourself at the base of your neck and do 8 small circles, and 2 fingers behind clavicle 8 x  . Do 8 semicircles at left armpit and right groin . Pump across chest from right to left 8 times . Pump down the right side of trunk from armpit to groin 8 times . Pump up the outside of right upper arm 8 times, inside of upper arm to outside 8x, outside of upper arm again 8x  . Pump across chest from R to L 8 times . Pump  down the right side of trunk from armpit to groin 8 times . Pump top of forearm from wrist to elbow 8 times . Pump up the outside of right upper arm 8 times . Pump across chest from right to left 8 times . Pump down the right side of trunk from armpit to groin 8 times . Pump up the back of the forearm from wrist to elbow 8 times . Pump up the outside of right upper arm 8 times . Pump across chest from right to left 8 times . Pump down the right side of trunk from armpit to groin 8 times . Do 8 semicircles at left armpit and right groin 8 times . Repeat nr.1  SLOW and LIGHT with only your palm NOT FINGERTIPS If help at home can replace or do also massage over upper back from right to left , when doing chest from right to left.

## 2018-03-17 NOTE — Therapy (Signed)
[facility] [address] [place], [address] Phone: [phone]   Fax:  [phone]  Occupational Therapy Evaluation  Patient Details  Name: Mckenzie Lopez MRN: [id number] Date of Birth: Oct 02, 1985 Referring Provider (OT): Garlan Fair   Encounter Date: 03/17/2018  OT End of Session - 03/17/18 1853    Visit Number  1    Number of Visits  12    Date for OT Re-Evaluation  06/16/18    OT Start Time  1046    OT Stop Time  1145    OT Time Calculation (min)  59 min    Activity Tolerance  Patient tolerated treatment well    Behavior During Therapy  [facility] for tasks assessed/performed       Past Medical History:  Diagnosis Date  . Cancer ([place]) 07/252019   breast  . CHF (congestive heart failure) ([place])    POSTPARTUM 2017/ RESOLVED  . Eczema   . Hemoglobin C trait ([place]) 11/29/2015  . Hyperlipidemia   . Hypertension   . IFG (impaired fasting glucose)   . Low back pain   . Migraines   . Morbid obesity ([place])     Past Surgical History:  Procedure Laterality Date  . AXILLARY LYMPH NODE BIOPSY Right 12/01/2017   METASTATIC MAMMARY CARCINOMA  . AXILLARY LYMPH NODE BIOPSY Left 12/01/2017   NEGATIVE FOR MALIGNANCY  . BREAST BIOPSY Right 12/01/2017   11 and 12 o'clock, INVASIVE MAMMARY CARCINOMA, ER/PR positive HER2 negative    There were no vitals filed for this visit.  Subjective Assessment - 03/17/18 1819    Subjective   I seen the [facility] PT/OT one time and I got the over the counter sleeve at [facility] - they told me my insurance do not cover at custom sleeve - my arms swell more up when I am using it - it do go down at night time - but no my hand  - it do not go down all the way - my shoulder ROM is better - I was just afraid doing to much  that I would hurt somethign     Pertinent History   R modified radical mastectomy with lymph node dissection; negative margins; 5 of 21 lymph nodes positive for  metastatic carcinoma;01/31/18 Chemotherapy Initiated: Dose-dense doxorubicin-cyclophosphamide (ddAC) q2 weeks x 4 cycles followed by weekly paclitaxel (weekly-T).    Patient Stated Goals  I want to keep the lympedema under control     Currently in Pain?  No/denies        [facility] OT Assessment - 03/17/18 0001      Assessment   Medical Diagnosis  R UE lymphedema     Referring Provider (OT)  Apolonio Schneiders [place]    Onset Date/Surgical Date  01/05/18    Hand Dominance  Left      Precautions   Precaution Comments  R UE lymphedema      Home  Environment   Lives With  --   80 yrs old daughter and mom     Prior Function   Vocation  Full time employment    Leisure  FMLA from [employer] , like to shop,      AROM   Right Shoulder Extension  70 Degrees    Right Shoulder Flexion  160 Degrees    Right Shoulder ABduction  165 Degrees    Left Shoulder Extension  70 Degrees    Left Shoulder Flexion  180 Degrees  Left Shoulder ABduction  180 Degrees       LYMPHEDEMA/ONCOLOGY QUESTIONNAIRE - 03/17/18 1849      Right Upper Extremity Lymphedema   15 cm Proximal to Olecranon Process  47.5 cm    10 cm Proximal to Olecranon Process  43 cm    Olecranon Process  32 cm    15 cm Proximal to Ulnar Styloid Process  32 cm    10 cm Proximal to Ulnar Styloid Process  27.5 cm    Just Proximal to Ulnar Styloid Process  21.5 cm    Across Hand at [employer]  24.5 cm    At [place] of 2nd Digit  7.3 cm    At [facility] of Thumb  7.2 cm      Left Upper Extremity Lymphedema   15 cm Proximal to Olecranon Process  45.9 cm    10 cm Proximal to Olecranon Process  42.8 cm    Olecranon Process  32.5 cm    15 cm Proximal to Ulnar Styloid Process  31.3 cm    10 cm Proximal to Ulnar Styloid Process  26.3 cm    Just Proximal to Ulnar Styloid Process  20.5 cm    Across Hand at [employer]  21 cm    At [place] of 2nd Digit  7.2 cm    At [facility] of Thumb  7.1 cm            Assess fit of compression sleeve and glove    Cont to wear daysleeve and glove  And night time isotoner glove and tubigrip soft on hand to elbow  - fitted on pt    R shoulder AAROM on wall for flexion and ABD   wall slides  10 reps 1-2 x day  Scapula scqueezes - 10 reps  5 x day    Hand out provided and review with pt   and MLD done - pt to do 1 x day  For Right Arm:  Felicie Morn yourself at the base of your neck and do 8 small circles, and 2 fingers behind clavicle 8 x  . Do 8 semicircles at left armpit and right groin . Pump across chest from right to left 8 times . Pump down the right side of trunk from armpit to groin 8 times . Pump up the outside of right upper arm 8 times, inside of upper arm to outside 8x, outside of upper arm again 8x  . Pump across chest from R to L 8 times . Pump  down the right side of trunk from armpit to groin 8 times . Pump top of forearm from wrist to elbow 8 times . Pump up the outside of right upper arm 8 times . Pump across chest from right to left 8 times . Pump down the right side of trunk from armpit to groin 8 times . Pump up the back of the forearm from wrist to elbow 8 times . Pump up the outside of right upper arm 8 times . Pump across chest from right to left 8 times . Pump down the right side of trunk from armpit to groin 8 times . Do 8 semicircles at left armpit and right groin 8 times . Repeat nr.1  SLOW and LIGHT with only your palm NOT FINGERTIPS If help at home can replace or do also massage over upper back from right to left , when doing chest from right to left.  OT Education - 03/17/18 1852    Education Details  findings of eval , lymphedema education - and ROM - MLD    Methods  Explanation;Demonstration;Handout    Comprehension  Verbalized understanding;Returned demonstration          OT Long Term Goals - 03/17/18 1906      OT LONG TERM GOAL #1   Title  Pt to be independent in self MLD  and wearing correct compression to maintain lymphedema under  control     Baseline  no knowledge of MLD and L UE circumference increase by 11 am in hand by 3.5 cm wris and forearm 1.2 cm - ? if still stage 1     Time  4    Period  Weeks    Status  New    Target Date  04/14/18      OT LONG TERM GOAL #2   Title  Monitor lymphedema in L UE  for pt to maintain or change homeprogram  through out chemo to keep lymphedema under control     Time  12    Period  Weeks    Status  New    Target Date  06/16/18            Plan - 03/17/18 1859    Clinical Impression Statement  Pt is 10 wks s/p R mastectomy with 5 out of 21 axillar ln positive - no metastasis - pt show great AROM in L shoulder - feeling more of pull in upper arm - scar doing well - strength 5/5 in shoulder - pt do show stage 1 lymhedema - but starting to go into stage 2 - appear she do not decrease to baseline at night time anymore - pt wear over the counter sleeve and glove about 5 hrs day -and pt report that DME company said to her that her insurance to not cover a custom sleeve - pt do sow increase of 3.4 cm in hand , 1 cm at wrist, 1.2 cm in forearm , elbow 0.5 cm , upper arm 1.6 cm - ed pt this date on self MLD and proivded a tubigrip with isotoner glove for night time compression - will assess if pt need custom sleeve and glove  - pt can benefit from OT services     Occupational performance deficits (Please refer to evaluation for details):  IADL's;Work;Leisure;Play    Rehab Potential  Good    Current Impairments/barriers affecting progress:  chronic conditions     OT Frequency  1x / week    OT Duration  12 weeks    OT Treatment/Interventions  Self-care/ADL training;Manual lymph drainage;Patient/family education;Therapeutic exercise    Plan  assess circumference if decrease wtih MLD and tubgrip and isotoner glove night time     Clinical Decision Making  Limited treatment options, no task modification necessary    OT Home Exercise Plan  see pt instruction        Patient will benefit  from skilled therapeutic intervention in order to improve the following deficits and impairments:  Impaired flexibility, Increased edema, Impaired UE functional use, Decreased knowledge of precautions  Visit Diagnosis: Postmastectomy lymphedema syndrome - Plan: Ot plan of care cert/re-cert  Stiffness of right shoulder, not elsewhere classified - Plan: Ot plan of care cert/re-cert    Problem List Patient Active Problem List   Diagnosis Date Noted  . Malignant neoplasm of upper-outer quadrant of right breast in female, estrogen receptor positive ([place]) 12/07/2017  . Allergic conjunctivitis  of both eyes 09/16/2017  . Family history of breast cancer 10/13/2016  . Dysplasia of cervix, low grade (CIN 1) 10/13/2016  . Thrombocytosis ([place]) 03/16/2016  . Hemoglobin C trait ([place]) 11/29/2015  . Hematuria 02/21/2015  . Vitamin D deficiency 02/21/2015  . Sterilization consult 02/21/2015  . Low back pain   . Morbid obesity ([place])   . Hyperlipidemia   . Hypertension   . IFG (impaired fasting glucose)   . Migraines     Shirel Mallis OTR/L,CLT 03/17/2018, 7:15 PM  [facility] [address] [place], [address] Phone: [phone]   Fax:  [phone]  Name: Mckenzie Lopez MRN: [id number] Date of Birth: [birth year]

## 2018-03-25 ENCOUNTER — Ambulatory Visit: Payer: BLUE CROSS/BLUE SHIELD | Admitting: Occupational Therapy

## 2018-03-25 DIAGNOSIS — M25611 Stiffness of right shoulder, not elsewhere classified: Secondary | ICD-10-CM | POA: Diagnosis not present

## 2018-03-25 DIAGNOSIS — G8929 Other chronic pain: Secondary | ICD-10-CM

## 2018-03-25 DIAGNOSIS — I972 Postmastectomy lymphedema syndrome: Secondary | ICD-10-CM | POA: Diagnosis not present

## 2018-03-25 DIAGNOSIS — M5441 Lumbago with sciatica, right side: Secondary | ICD-10-CM

## 2018-03-25 NOTE — Therapy (Signed)
[place] [address] [place], [address] Phone: [phone]   Fax:  [phone]  Occupational Therapy Treatment  Patient Details  Name: Tarryn Gabreille Dardis MRN: [id number] Date of Birth: 1985-06-14 Referring Provider (OT): Garlan Fair   Encounter Date: 03/25/2018  OT End of Session - 03/25/18 1321    Visit Number  2    Number of Visits  12    Date for OT Re-Evaluation  06/16/18    OT Start Time  1010    OT Stop Time  1050    OT Time Calculation (min)  40 min    Activity Tolerance  Patient tolerated treatment well    Behavior During Therapy  [facility] for tasks assessed/performed       Past Medical History:  Diagnosis Date  . Cancer ([place]) 07/252019   breast  . CHF (congestive heart failure) ([place])    POSTPARTUM 2017/ RESOLVED  . Eczema   . Hemoglobin C trait ([place]) 11/29/2015  . Hyperlipidemia   . Hypertension   . IFG (impaired fasting glucose)   . Low back pain   . Migraines   . Morbid obesity ([place])     Past Surgical History:  Procedure Laterality Date  . AXILLARY LYMPH NODE BIOPSY Right 12/01/2017   METASTATIC MAMMARY CARCINOMA  . AXILLARY LYMPH NODE BIOPSY Left 12/01/2017   NEGATIVE FOR MALIGNANCY  . BREAST BIOPSY Right 12/01/2017   11 and 12 o'clock, INVASIVE MAMMARY CARCINOMA, ER/PR positive HER2 negative    There were no vitals filed for this visit.  Subjective Assessment - 03/25/18 1319    Subjective   I did not wear for the last few days my sleeve - it felt like my swelling in my arm got worse with wearing the sleeve - did wear th glove you gave me for few days- my arms feels about the same except my hand still swollen - I start Monday the every week chemo but not as strong of a drug per the MD     Patient Stated Goals  I want to keep the lympedema under control     Currently in Pain?  No/denies       Assess circumference of R UE - pt decrease in all except hand , wrist and  upper arm still increase   pt did not wear her over the counter sleeve - circular knit this past few days - felt it made it worse  Pt to only wear her isotoner glove until I see her next week - she is switching to once week chemo - but not as strong drug  She is doing MLD at home    LYMPHEDEMA/ONCOLOGY QUESTIONNAIRE - 03/25/18 1116      Right Upper Extremity Lymphedema   15 cm Proximal to Olecranon Process  47 cm    10 cm Proximal to Olecranon Process  43 cm    Olecranon Process  32.5 cm    15 cm Proximal to Ulnar Styloid Process  31.3 cm    10 cm Proximal to Ulnar Styloid Process  26.8 cm    Just Proximal to Ulnar Styloid Process  21.5 cm    Across Hand at [employer]  23.5 cm    At [place] of 2nd Digit  7.5 cm    At [facility] of Thumb  7.5 cm      appear the week in the past that she had her chemo - her circumference of R  UE increase - will assess as she is going thru chemo and then radiation   Review with pt again her self MLD - had some questions if doing correct    Hand out provided and review with pt   and MLD done - pt to do 1 x day  For Right Arm:   Hug yourself at the base of your neck and do 8 small circles, and 2 fingers behind clavicle 8 x   Do 8 semicircles at left armpit and right groin  Pump across chest from right to left 8 times  Pump down the right side of trunk from armpit to groin 8 times  Pump up the outside of right upper arm 8 times, inside of upper arm to outside 8x, outside of upper arm again 8x   Pump across chest from R to L 8 times  Pump  down the right side of trunk from armpit to groin 8 times  Pump top of forearm from wrist to elbow 8 times  Pump up the outside of right upper arm 8 times  Pump across chest from right to left 8 times  Pump down the right side of trunk from armpit to groin 8 times  Pump up the back of the forearm from wrist to elbow 8 times  Pump up the outside of right upper arm 8 times  Pump across chest from right to  left 8 times  Pump down the right side of trunk from armpit to groin 8 times  Do 8 semicircles at left armpit and right groin 8 times  Repeat nr.1  SLOW and LIGHT with only your palm NOT FINGERTIPS If help at home can replace or do also massage over upper back from right to left , when doing chest from right to left.                OT Education - 03/25/18 1321    Education Details  progress , self MLD and POC    Person(s) Educated  Patient    Methods  Explanation;Demonstration;Handout    Comprehension  Verbalized understanding;Returned demonstration          OT Long Term Goals - 03/17/18 1906      OT LONG TERM GOAL #1   Title  Pt to be independent in self MLD  and wearing correct compression to maintain lymphedema under control     Baseline  no knowledge of MLD and L UE circumference increase by 11 am in hand by 3.5 cm wris and forearm 1.2 cm - ? if still stage 1     Time  4    Period  Weeks    Status  New    Target Date  04/14/18      OT LONG TERM GOAL #2   Title  Monitor lymphedema in L UE  for pt to maintain or change homeprogram  through out chemo to keep lymphedema under control     Time  12    Period  Weeks    Status  New    Target Date  06/16/18            Plan - 03/25/18 1322    Clinical Impression Statement  Pt is 11 wks s/p mastecomty with 5 our of 21 axillary ln positive - no metastasis - pt show progress in AROM in L shoulder - did report some numbness on posterior R upper arm - pt lympedema circumferenc did decrease in R arm - same  than L now this week except hand , wrist and  upper arm - pt to cont with MLD and isotoner glove and will assess - if it increase again with chemo on MOnday - pt now starting chemo every Monday but not as strong  drug     Occupational performance deficits (Please refer to evaluation for details):  IADL's;Work;Leisure;Play    Rehab Potential  Good    Current Impairments/barriers affecting progress:  chronic  conditions     OT Frequency  1x / week    OT Duration  12 weeks    OT Treatment/Interventions  Self-care/ADL training;Manual lymph drainage;Patient/family education;Therapeutic exercise    Plan  assess circumference after having chemo again this week , review if needed  MLD and if she needed sleeve and isotoner glove     Clinical Decision Making  Limited treatment options, no task modification necessary    OT Home Exercise Plan  see pt instruction        Patient will benefit from skilled therapeutic intervention in order to improve the following deficits and impairments:  Impaired flexibility, Increased edema, Impaired UE functional use, Decreased knowledge of precautions  Visit Diagnosis: Postmastectomy lymphedema syndrome  Stiffness of right shoulder, not elsewhere classified  Chronic right-sided low back pain with right-sided sciatica    Problem List Patient Active Problem List   Diagnosis Date Noted  . Malignant neoplasm of upper-outer quadrant of right breast in female, estrogen receptor positive ([place]) 12/07/2017  . Allergic conjunctivitis of both eyes 09/16/2017  . Family history of breast cancer 10/13/2016  . Dysplasia of cervix, low grade (CIN 1) 10/13/2016  . Thrombocytosis ([place]) 03/16/2016  . Hemoglobin C trait ([place]) 11/29/2015  . Hematuria 02/21/2015  . Vitamin D deficiency 02/21/2015  . Sterilization consult 02/21/2015  . Low back pain   . Morbid obesity ([place])   . Hyperlipidemia   . Hypertension   . IFG (impaired fasting glucose)   . Migraines     Simran Mannis OTR/L,CLT 03/25/2018, 1:25 PM  [facility] [address], [place], [address] Phone: [phone]   Fax:  [phone]  Name: Mckena Tamu Golz MRN: [id number] Date of Birth: 1985-08-19

## 2018-03-25 NOTE — Patient Instructions (Signed)
Cont with MLD  And AROM for shoulder  But hold off on sleeve and assess if need again after Monday's chemo  Do wear isotoner glove for hand and wrist lymphedema still increase

## 2018-03-28 DIAGNOSIS — Z5111 Encounter for antineoplastic chemotherapy: Secondary | ICD-10-CM | POA: Diagnosis not present

## 2018-03-28 DIAGNOSIS — Z170 Estrogen receptor positive status [ER+]: Secondary | ICD-10-CM | POA: Diagnosis not present

## 2018-03-28 DIAGNOSIS — C50811 Malignant neoplasm of overlapping sites of right female breast: Secondary | ICD-10-CM | POA: Diagnosis not present

## 2018-04-01 ENCOUNTER — Ambulatory Visit: Payer: BLUE CROSS/BLUE SHIELD | Admitting: Occupational Therapy

## 2018-04-01 DIAGNOSIS — M25611 Stiffness of right shoulder, not elsewhere classified: Secondary | ICD-10-CM | POA: Diagnosis not present

## 2018-04-01 DIAGNOSIS — G8929 Other chronic pain: Secondary | ICD-10-CM | POA: Diagnosis not present

## 2018-04-01 DIAGNOSIS — I972 Postmastectomy lymphedema syndrome: Secondary | ICD-10-CM | POA: Diagnosis not present

## 2018-04-01 DIAGNOSIS — M5441 Lumbago with sciatica, right side: Secondary | ICD-10-CM | POA: Diagnosis not present

## 2018-04-01 NOTE — Therapy (Signed)
[facility] [address] [place], [address] Phone: [phone]   Fax:  [phone]  Occupational Therapy Treatment  Patient Details  Name: Mckenzie Lopez MRN: [id number] Date of Birth: 1985/12/26 Referring Provider (OT): Garlan Fair   Encounter Date: 04/01/2018  OT End of Session - 04/01/18 1754    Number of Visits  12    Date for OT Re-Evaluation  06/16/18    OT Start Time  0945    OT Stop Time  1014    OT Time Calculation (min)  29 min    Activity Tolerance  Patient tolerated treatment well    Behavior During Therapy  [facility] for tasks assessed/performed       Past Medical History:  Diagnosis Date  . Cancer ([place]) 07/252019   breast  . CHF (congestive heart failure) ([place])    POSTPARTUM 2017/ RESOLVED  . Eczema   . Hemoglobin C trait ([place]) 11/29/2015  . Hyperlipidemia   . Hypertension   . IFG (impaired fasting glucose)   . Low back pain   . Migraines   . Morbid obesity ([place])     Past Surgical History:  Procedure Laterality Date  . AXILLARY LYMPH NODE BIOPSY Right 12/01/2017   METASTATIC MAMMARY CARCINOMA  . AXILLARY LYMPH NODE BIOPSY Left 12/01/2017   NEGATIVE FOR MALIGNANCY  . BREAST BIOPSY Right 12/01/2017   11 and 12 o'clock, INVASIVE MAMMARY CARCINOMA, ER/PR positive HER2 negative    There were no vitals filed for this visit.  Subjective Assessment - 04/01/18 1750    Subjective   I had my chemo Monday -I could see my arm was getting more swollen again about yesterday or Wed - I did found out my insurance out of [place] will cover my custom sleeve but  need preauthorization     Patient Stated Goals  I want to keep the lympedema under control     Currently in Pain?  No/denies          LYMPHEDEMA/ONCOLOGY QUESTIONNAIRE - 04/01/18 1058      Right Upper Extremity Lymphedema   15 cm Proximal to Olecranon Process  48.3 cm    10 cm Proximal to Olecranon Process  44.8 cm    Olecranon Process  33 cm    15 cm Proximal to Ulnar Styloid Process  32 cm    10 cm Proximal to Ulnar Styloid Process  27.4 cm    Just Proximal to Ulnar Styloid Process  27.6 cm    Across Hand at [employer]  23.2 cm    At [place] of 2nd Digit  7.5 cm    At [facility] of Thumb  7.5 cm         Assess circumference of R UE - pt measurements increase this week - had MOnday chemo    Pt fitted with isotoner glove with new tubigrip soft from hand to elbow and tubigrip F mid forearm to upper arm - make sure it do not roll at top  wear over weekend until Tues - getting chemo again MOnday  Will reassess- and get with DME company about custom [employer] soft sleeve and glove  Pt to cont with self MLD  Pt showed this date increase under arm , thoracic lymphedema that she did not show in the past  Pt to wear her bra to provide compression and will get next week for her to order from [facility] unilateteral mastectomy pad -  Pt to go ahead and get prescription for her compression garments from her MD - to pick up on Monday               OT Education - 04/01/18 1754    Education Details  wearing of compression and findings     Person(s) Educated  Patient    Methods  Explanation;Demonstration;Handout    Comprehension  Verbalized understanding;Returned demonstration          OT Long Term Goals - 03/17/18 1906      OT LONG TERM GOAL #1   Title  Pt to be independent in self MLD  and wearing correct compression to maintain lymphedema under control     Baseline  no knowledge of MLD and L UE circumference increase by 11 am in hand by 3.5 cm wris and forearm 1.2 cm - ? if still stage 1     Time  4    Period  Weeks    Status  New    Target Date  04/14/18      OT LONG TERM GOAL #2   Title  Monitor lymphedema in L UE  for pt to maintain or change homeprogram  through out chemo to keep lymphedema under control     Time  12    Period  Weeks    Status  New    Target Date   06/16/18            Plan - 04/01/18 1756    Clinical Impression Statement  Pt present 12 wks s/p mastectomy with 5 our of 21 axillary ln positive- no metastasis - pt having now chemo once a week on Monday -and in the past showed increase lymphedema the week of her chemo - but then better the 2nd week - her chemo drug is weaker one per pt - she was increase this date comapre to last week - will provide some compression over weekend and follow up wiht her day after her Monday chemo - will recommend custom compression sleeve [employer] soft and glove next week -and [employer] breast pad - she show this day some under arm thoracic lymphedema- to wear her bra to provide compresion under arm     Occupational performance deficits (Please refer to evaluation for details):  IADL's;Work;Leisure;Play    Rehab Potential  Good    Current Impairments/barriers affecting progress:  chronic conditions     OT Frequency  1x / week    OT Duration  12 weeks    OT Treatment/Interventions  Self-care/ADL training;Manual lymph drainage;Patient/family education;Therapeutic exercise    Plan  assess circumference after having chemo weekly now , review if needed  MLD and get with DME company about custom compression sleeve and glove     Clinical Decision Making  Limited treatment options, no task modification necessary    OT Home Exercise Plan  see pt instruction     Consulted and Agree with Plan of Care  Patient       Patient will benefit from skilled therapeutic intervention in order to improve the following deficits and impairments:  Impaired flexibility, Increased edema, Impaired UE functional use, Decreased knowledge of precautions  Visit Diagnosis: Postmastectomy lymphedema syndrome  Stiffness of right shoulder, not elsewhere classified  Chronic right-sided low back pain with right-sided sciatica    Problem List Patient Active Problem List   Diagnosis Date Noted  . Malignant neoplasm of upper-outer  quadrant of right breast in female, estrogen receptor positive ([place])  12/07/2017  . Allergic conjunctivitis of both eyes 09/16/2017  . Family history of breast cancer 10/13/2016  . Dysplasia of cervix, low grade (CIN 1) 10/13/2016  . Thrombocytosis ([place]) 03/16/2016  . Hemoglobin C trait ([place]) 11/29/2015  . Hematuria 02/21/2015  . Vitamin D deficiency 02/21/2015  . Sterilization consult 02/21/2015  . Low back pain   . Morbid obesity ([place])   . Hyperlipidemia   . Hypertension   . IFG (impaired fasting glucose)   . Migraines     Alease Fait OTR/L,CLT 04/01/2018, 6:00 PM  [place] [facility] [address], [place], [address] Phone: [phone]   Fax:  [phone]  Name: Mckenzie Lopez MRN: [id number] Date of Birth: 04/14/1986

## 2018-04-01 NOTE — Patient Instructions (Signed)
Pt to wear over the weekend isotoner glove with new tubigrip soft from hand to elbow and tubigrip F mid forearm to upper arm - make sure it do not roll at top

## 2018-04-04 DIAGNOSIS — C50811 Malignant neoplasm of overlapping sites of right female breast: Secondary | ICD-10-CM | POA: Diagnosis not present

## 2018-04-04 DIAGNOSIS — Z170 Estrogen receptor positive status [ER+]: Secondary | ICD-10-CM | POA: Diagnosis not present

## 2018-04-04 DIAGNOSIS — Z5111 Encounter for antineoplastic chemotherapy: Secondary | ICD-10-CM | POA: Diagnosis not present

## 2018-04-05 ENCOUNTER — Ambulatory Visit: Payer: BLUE CROSS/BLUE SHIELD | Admitting: Occupational Therapy

## 2018-04-05 DIAGNOSIS — G8929 Other chronic pain: Secondary | ICD-10-CM | POA: Diagnosis not present

## 2018-04-05 DIAGNOSIS — M25611 Stiffness of right shoulder, not elsewhere classified: Secondary | ICD-10-CM

## 2018-04-05 DIAGNOSIS — I972 Postmastectomy lymphedema syndrome: Secondary | ICD-10-CM

## 2018-04-05 DIAGNOSIS — M5441 Lumbago with sciatica, right side: Secondary | ICD-10-CM | POA: Diagnosis not present

## 2018-04-05 NOTE — Patient Instructions (Signed)
Pt to cont with isotoner glove and soft tubigrip and tubigrip for forearm and upper arm  And need to get to [facility] to measure her for custom sleeve and glove and [employer]

## 2018-04-05 NOTE — Therapy (Signed)
[facility] [address] [place], [address] Phone: [phone]   Fax:  [phone]  Occupational Therapy Treatment  Patient Details  Name: Mckenzie Lopez MRN: [id number] Date of Birth: 17-Nov-1985 Referring Provider (OT): Garlan Fair   Encounter Date: 04/05/2018  OT End of Session - 04/05/18 1154    Visit Number  3    Number of Visits  12    Date for OT Re-Evaluation  06/16/18    OT Start Time  0935    OT Stop Time  1000    OT Time Calculation (min)  25 min    Activity Tolerance  Patient tolerated treatment well    Behavior During Therapy  [facility] for tasks assessed/performed       Past Medical History:  Diagnosis Date  . Cancer ([place]) 07/252019   breast  . CHF (congestive heart failure) ([place])    POSTPARTUM 2017/ RESOLVED  . Eczema   . Hemoglobin C trait ([place]) 11/29/2015  . Hyperlipidemia   . Hypertension   . IFG (impaired fasting glucose)   . Low back pain   . Migraines   . Morbid obesity ([place])     Past Surgical History:  Procedure Laterality Date  . AXILLARY LYMPH NODE BIOPSY Right 12/01/2017   METASTATIC MAMMARY CARCINOMA  . AXILLARY LYMPH NODE BIOPSY Left 12/01/2017   NEGATIVE FOR MALIGNANCY  . BREAST BIOPSY Right 12/01/2017   11 and 12 o'clock, INVASIVE MAMMARY CARCINOMA, ER/PR positive HER2 negative    There were no vitals filed for this visit.  Subjective Assessment - 04/05/18 1151    Subjective   I had chemo again yesterday - doing okay - feeling okay - I tried to get precription for my compression but the lady did not understand very well - so I did not get it yesterday - my hand looks more swollen - under my arm I tried to keep my bra pulled up for comrpression     Patient Stated Goals  I want to keep the lympedema under control     Currently in Pain?  No/denies          LYMPHEDEMA/ONCOLOGY QUESTIONNAIRE - 04/05/18 0946      Right Upper Extremity Lymphedema   15  cm Proximal to Olecranon Process  48.3 cm    10 cm Proximal to Olecranon Process  44.8 cm    Olecranon Process  32.8 cm    15 cm Proximal to Ulnar Styloid Process  32 cm    10 cm Proximal to Ulnar Styloid Process  27 cm    Just Proximal to Ulnar Styloid Process  21 cm    Across Hand at [employer]  23.4 cm    At [place] of 2nd Digit  7.4 cm    At [facility] of Thumb  7.4 cm         Assess circumference of R UE - pt measurements close to last weeks end - but wrist did decrease - had yesterday chemo    Pt to wear  isotoner glove with  tubigrip soft from hand to elbow and tubigrip F mid forearm to upper arm - make sure it do not roll at upper arm  Wear tubigrip and isotoner glove next week  Will reassess- and pt to get  with DME company today about custom [employer] soft sleeve and glove  - as well as jovipak breast pad  Pt to cont with self MLD  Pt show the last week  Increase circumference under arm , thoracic lymphedema that she did not show in the past  Pt to wear her bra to provide compression and get DME company ( waiting for preauthorization -   To order Jovipak unilateteral mastectomy pad for thoracic lymphedema  Pt did try and was unable to get get prescription for her compression garments from her MD - this OT will attempt to call MD office tomorrow again                OT Education - 04/05/18 1153    Education Details  wearing of compression and findings     Person(s) Educated  Patient    Methods  Explanation;Demonstration;Handout    Comprehension  Verbalized understanding;Returned demonstration          OT Long Term Goals - 03/17/18 1906      OT LONG TERM GOAL #1   Title  Pt to be independent in self MLD  and wearing correct compression to maintain lymphedema under control     Baseline  no knowledge of MLD and L UE circumference increase by 11 am in hand by 3.5 cm wris and forearm 1.2 cm - ? if still stage 1     Time  4    Period  Weeks    Status  New     Target Date  04/14/18      OT LONG TERM GOAL #2   Title  Monitor lymphedema in L UE  for pt to maintain or change homeprogram  through out chemo to keep lymphedema under control     Time  12    Period  Weeks    Status  New    Target Date  06/16/18            Plan - 04/05/18 1154    Clinical Impression Statement  Pt present just over 12 wks s/p R mastctomy iwth 5 out of 21 axillary ln positive -no metastasis - pt having now weekly chemo on Mondays and showed since last week under arm lymphdedema - and R UE in need of custom  [employer] soft sleeve and glove - await  precription  and [employer] did send  preauthorization reequest  to insurance     Occupational performance deficits (Please refer to evaluation for details):  IADL's;Work;Leisure;Play    Rehab Potential  Good    Current Impairments/barriers affecting progress:  chronic conditions     OT Frequency  1x / week    OT Duration  12 weeks    OT Treatment/Interventions  Self-care/ADL training;Manual lymph drainage;Patient/family education;Therapeutic exercise    Plan  assess circumference after having chemo weekly now , review if needed  MLD and DME company about custom compression sleeve and glove     Clinical Decision Making  Limited treatment options, no task modification necessary    OT Home Exercise Plan  see pt instruction     Consulted and Agree with Plan of Care  Patient       Patient will benefit from skilled therapeutic intervention in order to improve the following deficits and impairments:  Impaired flexibility, Increased edema, Impaired UE functional use, Decreased knowledge of precautions  Visit Diagnosis: Postmastectomy lymphedema syndrome  Stiffness of right shoulder, not elsewhere classified  Chronic right-sided low back pain with right-sided sciatica    Problem List Patient Active Problem List   Diagnosis Date Noted  . Malignant neoplasm of upper-outer quadrant of right breast in  female, estrogen  receptor positive ([place]) 12/07/2017  . Allergic conjunctivitis of both eyes 09/16/2017  . Family history of breast cancer 10/13/2016  . Dysplasia of cervix, low grade (CIN 1) 10/13/2016  . Thrombocytosis ([place]) 03/16/2016  . Hemoglobin C trait ([place]) 11/29/2015  . Hematuria 02/21/2015  . Vitamin D deficiency 02/21/2015  . Sterilization consult 02/21/2015  . Low back pain   . Morbid obesity ([place])   . Hyperlipidemia   . Hypertension   . IFG (impaired fasting glucose)   . Migraines     Ronika Kelson OTR/L,CLT 04/05/2018, 12:13 PM  [facility] [address], [place], [address] Phone: [phone]   Fax:  [phone]  Name: Mckenzie Lopez MRN: [id number] Date of Birth: Apr 27, 1986

## 2018-04-11 DIAGNOSIS — Z170 Estrogen receptor positive status [ER+]: Secondary | ICD-10-CM | POA: Diagnosis not present

## 2018-04-11 DIAGNOSIS — Z5111 Encounter for antineoplastic chemotherapy: Secondary | ICD-10-CM | POA: Diagnosis not present

## 2018-04-11 DIAGNOSIS — T451X5A Adverse effect of antineoplastic and immunosuppressive drugs, initial encounter: Secondary | ICD-10-CM | POA: Diagnosis not present

## 2018-04-11 DIAGNOSIS — Z9011 Acquired absence of right breast and nipple: Secondary | ICD-10-CM | POA: Diagnosis not present

## 2018-04-11 DIAGNOSIS — C773 Secondary and unspecified malignant neoplasm of axilla and upper limb lymph nodes: Secondary | ICD-10-CM | POA: Diagnosis not present

## 2018-04-11 DIAGNOSIS — C50811 Malignant neoplasm of overlapping sites of right female breast: Secondary | ICD-10-CM | POA: Diagnosis not present

## 2018-04-11 DIAGNOSIS — R0981 Nasal congestion: Secondary | ICD-10-CM | POA: Diagnosis not present

## 2018-04-11 DIAGNOSIS — I890 Lymphedema, not elsewhere classified: Secondary | ICD-10-CM | POA: Diagnosis not present

## 2018-04-11 DIAGNOSIS — R05 Cough: Secondary | ICD-10-CM | POA: Diagnosis not present

## 2018-04-11 DIAGNOSIS — I427 Cardiomyopathy due to drug and external agent: Secondary | ICD-10-CM | POA: Diagnosis not present

## 2018-04-14 ENCOUNTER — Ambulatory Visit: Payer: BLUE CROSS/BLUE SHIELD | Attending: Physician Assistant | Admitting: Occupational Therapy

## 2018-04-14 DIAGNOSIS — M25611 Stiffness of right shoulder, not elsewhere classified: Secondary | ICD-10-CM | POA: Insufficient documentation

## 2018-04-14 DIAGNOSIS — G8929 Other chronic pain: Secondary | ICD-10-CM | POA: Diagnosis not present

## 2018-04-14 DIAGNOSIS — I972 Postmastectomy lymphedema syndrome: Secondary | ICD-10-CM | POA: Diagnosis not present

## 2018-04-14 DIAGNOSIS — M5441 Lumbago with sciatica, right side: Secondary | ICD-10-CM | POA: Insufficient documentation

## 2018-04-14 NOTE — Therapy (Signed)
[facility] [address], [place], [address] Phone: [phone]   Fax:  [phone]  Occupational Therapy Treatment  Patient Details  Name: Mckenzie Lopez MRN: [id number] Date of Birth: Feb 24, 1986 Referring Provider (OT): Garlan Fair   Encounter Date: 04/14/2018  OT End of Session - 04/14/18 1908    Visit Number  4    Number of Visits  12    Date for OT Re-Evaluation  06/16/18    OT Start Time  0945    OT Stop Time  1021    OT Time Calculation (min)  36 min    Activity Tolerance  Patient tolerated treatment well    Behavior During Therapy  [facility] for tasks assessed/performed       Past Medical History:  Diagnosis Date  . Cancer ([place]) 07/252019   breast  . CHF (congestive heart failure) ([place])    POSTPARTUM 2017/ RESOLVED  . Eczema   . Hemoglobin C trait ([place]) 11/29/2015  . Hyperlipidemia   . Hypertension   . IFG (impaired fasting glucose)   . Low back pain   . Migraines   . Morbid obesity ([place])     Past Surgical History:  Procedure Laterality Date  . AXILLARY LYMPH NODE BIOPSY Right 12/01/2017   METASTATIC MAMMARY CARCINOMA  . AXILLARY LYMPH NODE BIOPSY Left 12/01/2017   NEGATIVE FOR MALIGNANCY  . BREAST BIOPSY Right 12/01/2017   11 and 12 o'clock, INVASIVE MAMMARY CARCINOMA, ER/PR positive HER2 negative    There were no vitals filed for this visit.  Subjective Assessment - 04/14/18 1903    Subjective   I had chemo on Monday and had allergic reaction - so they had to stop treatment and cont later - but my MD going to change the chemo drug this coming Monday- arm doing okay - I done the isotoner glove and white little sleeve you gave me few nights and then yesterday my old thin sleeve but feel that increase my swelling in hand - better when taking if off - I will try and get my precription this  am - just give me your fax nr     Patient Stated Goals  I want to keep the lympedema  under control     Currently in Pain?  No/denies          LYMPHEDEMA/ONCOLOGY QUESTIONNAIRE - 04/14/18 1000      Right Upper Extremity Lymphedema   15 cm Proximal to Olecranon Process  48.8 cm    10 cm Proximal to Olecranon Process  43.3 cm    Olecranon Process  32.5 cm    15 cm Proximal to Ulnar Styloid Process  31.5 cm    10 cm Proximal to Ulnar Styloid Process  27.2 cm    Just Proximal to Ulnar Styloid Process  22.5 cm    Across Hand at [employer]  24.5 cm    At [place] of 2nd Digit  7.5 cm    At [facility] of Thumb  7.5 cm         Circumference of R UE - decrease in proximal forearm to distal upper arm - see flowsheet  still increase trunkle lymphedema - provided and fitted with Komprex foam to wear under bra and camisole - on R breast , under arm to scapula   And then provided new soft tubigrip to hand to elbow - and tubigrip F for wrist to upper arm   until getting  custom sleeve and glove  Pt show increase lymphedema with wearing her old over the counter sleeve and glove or gauntlet - she got prior to starting at this clinic Pt doing MLD at home -  AROM assess -pt feeling pull with AAROM shoulder flexion and ABD on wall - un upper arm - ? Increase lymphedema in upper arm  Ext and interna rotation WNL               OT Education - 04/14/18 1906    Education Details  use of temporary compression -    Person(s) Educated  Patient    Methods  Explanation;Demonstration;Handout    Comprehension  Verbalized understanding;Returned demonstration          OT Long Term Goals - 03/17/18 1906      OT LONG TERM GOAL #1   Title  Pt to be independent in self MLD  and wearing correct compression to maintain lymphedema under control     Baseline  no knowledge of MLD and L UE circumference increase by 11 am in hand by 3.5 cm wris and forearm 1.2 cm - ? if still stage 1     Time  4    Period  Weeks    Status  New    Target Date  04/14/18      OT LONG TERM GOAL #2    Title  Monitor lymphedema in L UE  for pt to maintain or change homeprogram  through out chemo to keep lymphedema under control     Time  12    Period  Weeks    Status  New    Target Date  06/16/18            Plan - 04/14/18 1909    Clinical Impression Statement  Pt R UE lymphedema circumference decrease since last time in proximal forearm to distal upper arm -but hand , wrist , distal forearm and prox upper arm increase - and trunkle lymphedema- awaiting precription from MD for custom daytime compression sleeve and glove ,and jovipak breast pad - pt wearing temporary compression  on arm and komprex foam provided for her to wear for trunkle lymphedema - will assess her day atfter chemo next week and compare     Occupational performance deficits (Please refer to evaluation for details):  IADL's;Work;Leisure;Play    Rehab Potential  Good    Current Impairments/barriers affecting progress:  chronic conditions     OT Frequency  1x / week    OT Duration  8 weeks    OT Treatment/Interventions  Self-care/ADL training;Manual lymph drainage;Patient/family education;Therapeutic exercise    Plan  assess circumference after having chemo weekly now , assess compression and check on ordering with DME company     Clinical Decision Making  Limited treatment options, no task modification necessary    OT Home Exercise Plan  see pt instruction     Consulted and Agree with Plan of Care  Patient       Patient will benefit from skilled therapeutic intervention in order to improve the following deficits and impairments:  Impaired flexibility, Increased edema, Impaired UE functional use, Decreased knowledge of precautions  Visit Diagnosis: Postmastectomy lymphedema syndrome  Stiffness of right shoulder, not elsewhere classified  Chronic right-sided low back pain with right-sided sciatica    Problem List Patient Active Problem List   Diagnosis Date Noted  . Malignant neoplasm of upper-outer quadrant  of right breast in female, estrogen receptor positive ([place]) 12/07/2017  .  Allergic conjunctivitis of both eyes 09/16/2017  . Family history of breast cancer 10/13/2016  . Dysplasia of cervix, low grade (CIN 1) 10/13/2016  . Thrombocytosis ([place]) 03/16/2016  . Hemoglobin C trait ([place]) 11/29/2015  . Hematuria 02/21/2015  . Vitamin D deficiency 02/21/2015  . Sterilization consult 02/21/2015  . Low back pain   . Morbid obesity ([place])   . Hyperlipidemia   . Hypertension   . IFG (impaired fasting glucose)   . Migraines     Juliene Kirsh OTR/L,CLT 04/14/2018, 7:13 PM  [facility] [address], [place], [address] Phone: [phone]   Fax:  [phone]  Name: Mckenzie Lopez MRN: [id number] Date of Birth: [birth year]

## 2018-04-14 NOTE — Patient Instructions (Signed)
Fitted with new isotoner glove and soft tubigrip for forearm and hand  And tubigrip F for forearm to upper arm  to wear temporary until getting custom sleeve  Provided Komprex foam for chest , under arm and R scapula to wear under bra or camisole to decrease trunkle lymphedema while waiting for jovipak breast pad

## 2018-04-18 DIAGNOSIS — Z5111 Encounter for antineoplastic chemotherapy: Secondary | ICD-10-CM | POA: Diagnosis not present

## 2018-04-18 DIAGNOSIS — Z170 Estrogen receptor positive status [ER+]: Secondary | ICD-10-CM | POA: Diagnosis not present

## 2018-04-18 DIAGNOSIS — C50811 Malignant neoplasm of overlapping sites of right female breast: Secondary | ICD-10-CM | POA: Diagnosis not present

## 2018-04-19 ENCOUNTER — Ambulatory Visit: Payer: BLUE CROSS/BLUE SHIELD | Admitting: Occupational Therapy

## 2018-04-19 DIAGNOSIS — M5441 Lumbago with sciatica, right side: Secondary | ICD-10-CM

## 2018-04-19 DIAGNOSIS — I972 Postmastectomy lymphedema syndrome: Secondary | ICD-10-CM | POA: Diagnosis not present

## 2018-04-19 DIAGNOSIS — M25611 Stiffness of right shoulder, not elsewhere classified: Secondary | ICD-10-CM

## 2018-04-19 DIAGNOSIS — G8929 Other chronic pain: Secondary | ICD-10-CM

## 2018-04-19 NOTE — Patient Instructions (Signed)
Con same HEP- await PA from insurance company for compression

## 2018-04-19 NOTE — Therapy (Signed)
[facility] [address] [place], [address] Phone: [phone]   Fax:  [phone]  Occupational Therapy Treatment  Patient Details  Name: Mckenzie Lopez MRN: [id number] Date of Birth: 05/06/1986 Referring Provider (OT): Garlan Fair   Encounter Date: 04/19/2018  OT End of Session - 04/19/18 1610    Visit Number  5    Number of Visits  12    Date for OT Re-Evaluation  06/16/18    OT Start Time  1130    OT Stop Time  1155    OT Time Calculation (min)  25 min    Activity Tolerance  Patient tolerated treatment well    Behavior During Therapy  [facility] for tasks assessed/performed       Past Medical History:  Diagnosis Date  . Cancer ([place]) 07/252019   breast  . CHF (congestive heart failure) ([place])    POSTPARTUM 2017/ RESOLVED  . Eczema   . Hemoglobin C trait ([place]) 11/29/2015  . Hyperlipidemia   . Hypertension   . IFG (impaired fasting glucose)   . Low back pain   . Migraines   . Morbid obesity ([place])     Past Surgical History:  Procedure Laterality Date  . AXILLARY LYMPH NODE BIOPSY Right 12/01/2017   METASTATIC MAMMARY CARCINOMA  . AXILLARY LYMPH NODE BIOPSY Left 12/01/2017   NEGATIVE FOR MALIGNANCY  . BREAST BIOPSY Right 12/01/2017   11 and 12 o'clock, INVASIVE MAMMARY CARCINOMA, ER/PR positive HER2 negative    There were no vitals filed for this visit.  Subjective Assessment - 04/19/18 1608    Subjective   I had chemo yesterday - on Sunday my hand and wrist was more swollen and I wore some compression -and since last time did sleep in the glove and tubigrip you cut me - I think that komprex foam you gave me for under my arm is helping - it is not as tight with reaching over head  under my arm     Patient Stated Goals  I want to keep the lympedema under control     Currently in Pain?  No/denies          LYMPHEDEMA/ONCOLOGY QUESTIONNAIRE - 04/19/18 1133      Right Upper  Extremity Lymphedema   15 cm Proximal to Olecranon Process  47 cm    10 cm Proximal to Olecranon Process  43.7 cm    Olecranon Process  32.7 cm    15 cm Proximal to Ulnar Styloid Process  31.5 cm    10 cm Proximal to Ulnar Styloid Process  27 cm    Just Proximal to Ulnar Styloid Process  22 cm    Across Hand at [employer]  24 cm    At [place] of 2nd Digit  7.6 cm    At [facility] of Thumb  7.8 cm        Pt had chemo yesterday - doing weekly -per pt swelling was good on Sat - but Sunday had increase swelling in R hand and wrist   pt this date still increase in digits, hand and wrist - and upper arm - compare to L UE  Pt to cont with temp compression using isotoner glove , tubigrip soft hand to elbow and F tubigrip from wrist to upper arm -  As needed  And can try contrast for hand and wrist  Cont komprex foam under arm - and breast /scapula R - report  decrease tightness under arm with reaching over head  Pt using it at night time  Per DME - waiting for PA from insurance company for compression garments              OT Education - 04/19/18 1610    Education Details  use of temporary compression -    Person(s) Educated  Patient    Methods  Explanation;Demonstration;Handout    Comprehension  Verbalized understanding;Returned demonstration          OT Long Term Goals - 03/17/18 1906      OT LONG TERM GOAL #1   Title  Pt to be independent in self MLD  and wearing correct compression to maintain lymphedema under control     Baseline  no knowledge of MLD and L UE circumference increase by 11 am in hand by 3.5 cm wris and forearm 1.2 cm - ? if still stage 1     Time  4    Period  Weeks    Status  New    Target Date  04/14/18      OT LONG TERM GOAL #2   Title  Monitor lymphedema in L UE  for pt to maintain or change homeprogram  through out chemo to keep lymphedema under control     Time  12    Period  Weeks    Status  New    Target Date  06/16/18             Plan - 04/19/18 1611    Clinical Impression Statement  Pt R UE lymphedema circumferenece cont to be decrease from forearm to upper arm compare to last time - but wrist and hand /digits still increase - and upper arm - Komprex foam did help for the under arm lymphedema -report less tightness with reaching over head - await insurance for PA for compression garments - pt cont temp compression and MLD     Occupational performance deficits (Please refer to evaluation for details):  IADL's;Work;Leisure;Play    Rehab Potential  Good    Current Impairments/barriers affecting progress:  chronic conditions     OT Frequency  1x / week    OT Duration  8 weeks    OT Treatment/Interventions  Self-care/ADL training;Manual lymph drainage;Patient/family education;Therapeutic exercise    Plan  assess circumference after having chemo weekly now , assess compression and check on ordering with DME company     Clinical Decision Making  Limited treatment options, no task modification necessary    OT Home Exercise Plan  see pt instruction     Consulted and Agree with Plan of Care  Patient       Patient will benefit from skilled therapeutic intervention in order to improve the following deficits and impairments:  Impaired flexibility, Increased edema, Impaired UE functional use, Decreased knowledge of precautions  Visit Diagnosis: Postmastectomy lymphedema syndrome  Stiffness of right shoulder, not elsewhere classified  Chronic right-sided low back pain with right-sided sciatica    Problem List Patient Active Problem List   Diagnosis Date Noted  . Malignant neoplasm of upper-outer quadrant of right breast in female, estrogen receptor positive ([place]) 12/07/2017  . Allergic conjunctivitis of both eyes 09/16/2017  . Family history of breast cancer 10/13/2016  . Dysplasia of cervix, low grade (CIN 1) 10/13/2016  . Thrombocytosis ([place]) 03/16/2016  . Hemoglobin C trait ([place]) 11/29/2015  .  Hematuria 02/21/2015  . Vitamin D deficiency 02/21/2015  . Sterilization consult 02/21/2015  .  Low back pain   . Morbid obesity ([place])   . Hyperlipidemia   . Hypertension   . IFG (impaired fasting glucose)   . Migraines     Rosalyn Gess OTR/L,CLT 04/19/2018, 4:15 PM  [facility] [address], [place], [address] Phone: [phone]   Fax:  [phone]  Name: Mckenzie Lopez MRN: [id number] Date of Birth: 05-11-1986

## 2018-04-25 DIAGNOSIS — C50811 Malignant neoplasm of overlapping sites of right female breast: Secondary | ICD-10-CM | POA: Diagnosis not present

## 2018-04-25 DIAGNOSIS — T451X5A Adverse effect of antineoplastic and immunosuppressive drugs, initial encounter: Secondary | ICD-10-CM | POA: Diagnosis not present

## 2018-04-25 DIAGNOSIS — I427 Cardiomyopathy due to drug and external agent: Secondary | ICD-10-CM | POA: Diagnosis not present

## 2018-04-25 DIAGNOSIS — Z170 Estrogen receptor positive status [ER+]: Secondary | ICD-10-CM | POA: Diagnosis not present

## 2018-04-25 DIAGNOSIS — Z5111 Encounter for antineoplastic chemotherapy: Secondary | ICD-10-CM | POA: Diagnosis not present

## 2018-04-26 ENCOUNTER — Ambulatory Visit: Payer: BLUE CROSS/BLUE SHIELD | Admitting: Occupational Therapy

## 2018-04-26 DIAGNOSIS — G8929 Other chronic pain: Secondary | ICD-10-CM | POA: Diagnosis not present

## 2018-04-26 DIAGNOSIS — I972 Postmastectomy lymphedema syndrome: Secondary | ICD-10-CM

## 2018-04-26 DIAGNOSIS — M5441 Lumbago with sciatica, right side: Secondary | ICD-10-CM

## 2018-04-26 DIAGNOSIS — M25611 Stiffness of right shoulder, not elsewhere classified: Secondary | ICD-10-CM | POA: Diagnosis not present

## 2018-04-26 NOTE — Patient Instructions (Signed)
Wear the tubigrip soft on hand to elbow and tubigrip F on upper arm And then do 8 cm short stretch from hand to upper arm

## 2018-04-26 NOTE — Therapy (Signed)
[facility] [address], [place], [address] Phone: [phone]   Fax:  [phone]  Occupational Therapy Treatment  Patient Details  Name: Mckenzie Lopez MRN: [id number] Date of Birth: 07-Dec-1985 Referring Provider (OT): Garlan Fair   Encounter Date: 04/26/2018  OT End of Session - 04/26/18 1733    Visit Number  6    Number of Visits  12    Date for OT Re-Evaluation  06/16/18    OT Start Time  1115    OT Stop Time  1150    OT Time Calculation (min)  35 min    Activity Tolerance  Patient tolerated treatment well    Behavior During Therapy  [facility] for tasks assessed/performed       Past Medical History:  Diagnosis Date  . Cancer ([place]) 07/252019   breast  . CHF (congestive heart failure) ([place])    POSTPARTUM 2017/ RESOLVED  . Eczema   . Hemoglobin C trait ([place]) 11/29/2015  . Hyperlipidemia   . Hypertension   . IFG (impaired fasting glucose)   . Low back pain   . Migraines   . Morbid obesity ([place])     Past Surgical History:  Procedure Laterality Date  . AXILLARY LYMPH NODE BIOPSY Right 12/01/2017   METASTATIC MAMMARY CARCINOMA  . AXILLARY LYMPH NODE BIOPSY Left 12/01/2017   NEGATIVE FOR MALIGNANCY  . BREAST BIOPSY Right 12/01/2017   11 and 12 o'clock, INVASIVE MAMMARY CARCINOMA, ER/PR positive HER2 negative    There were no vitals filed for this visit.  Subjective Assessment - 04/26/18 1328    Subjective   Had chemo yesterday -  doing okay - had little pins and needles in my fingers - they having me do ice to my hands while in chemo - arm feeling still good under my arm with using that Komprex foam you gave me- arm feeling okay - I 'm sleeping with the white temp compression your gave me - did not hear back from the ordeing of my garments or insurance - going to call them today     Patient Stated Goals  I want to keep the lympedema under control           LYMPHEDEMA/ONCOLOGY  QUESTIONNAIRE - 04/26/18 1122      Right Upper Extremity Lymphedema   15 cm Proximal to Olecranon Process  47.5 cm    10 cm Proximal to Olecranon Process  44 cm    Olecranon Process  33 cm    15 cm Proximal to Ulnar Styloid Process  32.4 cm    10 cm Proximal to Ulnar Styloid Process  27.5 cm    Just Proximal to Ulnar Styloid Process  22 cm    Across Hand at [employer]  24 cm    At [place] of 2nd Digit  7.5 cm    At [facility] of Thumb  7.8 cm       Pt measurement taken  See flow sheet  wrist still mostly increase compare to L UE  - but arm still increase but less than 2 cm  Pt wear Komprex foam to under arm and breast and scapula on the R - pt report helps for tightness and swelling under arm - await Jovipak breast pad  And then wear isotoner glove night time , soft tubigrip on hand to elbow and tubigrip F from forearm to upper arm  Did add this date for pt to do  one 8 cm short stretch bandage to hand to upper arm for night time  DME company busy with PA - do not know if still need special code for garments to be covered - because it is a [place] policy  Pt to contact insurance if coverage is approved - pt await compression garments                 OT Education - 04/26/18 1732    Education Details  add one layer of bandages over tubigrip at night time     Person(s) Educated  Patient    Methods  Explanation;Demonstration;Handout    Comprehension  Verbalized understanding;Returned demonstration          OT Long Term Goals - 03/17/18 1906      OT LONG TERM GOAL #1   Title  Pt to be independent in self MLD  and wearing correct compression to maintain lymphedema under control     Baseline  no knowledge of MLD and L UE circumference increase by 11 am in hand by 3.5 cm wris and forearm 1.2 cm - ? if still stage 1     Time  4    Period  Weeks    Status  New    Target Date  04/14/18      OT LONG TERM GOAL #2   Title  Monitor lymphedema in L UE  for pt to maintain or change  homeprogram  through out chemo to keep lymphedema under control     Time  12    Period  Weeks    Status  New    Target Date  06/16/18            Plan - 04/26/18 1734    Clinical Impression Statement  Pt show R UE lymphedema circumference are still increase compare to L but less than 2 cm - with wrist the worse - did add this date one layer  short stretch bandage to R UE  at night time over tubigrip for hand to upper arm while waiting for her compression garments     Occupational performance deficits (Please refer to evaluation for details):  IADL's;Work;Leisure;Play    Rehab Potential  Good    Current Impairments/barriers affecting progress:  chronic conditions     OT Frequency  1x / week    OT Duration  6 weeks    OT Treatment/Interventions  Self-care/ADL training;Manual lymph drainage;Patient/family education;Therapeutic exercise    Plan  assess circumferencd in 2 wks - and if garments in yet     Clinical Decision Making  Limited treatment options, no task modification necessary    OT Home Exercise Plan  see pt instruction     Consulted and Agree with Plan of Care  Patient       Patient will benefit from skilled therapeutic intervention in order to improve the following deficits and impairments:  Impaired flexibility, Increased edema, Impaired UE functional use, Decreased knowledge of precautions  Visit Diagnosis: Postmastectomy lymphedema syndrome  Stiffness of right shoulder, not elsewhere classified  Chronic right-sided low back pain with right-sided sciatica    Problem List Patient Active Problem List   Diagnosis Date Noted  . Malignant neoplasm of upper-outer quadrant of right breast in female, estrogen receptor positive ([place]) 12/07/2017  . Allergic conjunctivitis of both eyes 09/16/2017  . Family history of breast cancer 10/13/2016  . Dysplasia of cervix, low grade (CIN 1) 10/13/2016  . Thrombocytosis ([place]) 03/16/2016  . Hemoglobin C  trait ([place]) 11/29/2015  .  Hematuria 02/21/2015  . Vitamin D deficiency 02/21/2015  . Sterilization consult 02/21/2015  . Low back pain   . Morbid obesity ([place])   . Hyperlipidemia   . Hypertension   . IFG (impaired fasting glucose)   . Migraines     Rosalyn Gess OTR/L,CLT 04/26/2018, 5:39 PM  [facility] [address] [place], [address] Phone: [phone]   Fax:  [phone]  Name: Mckenzie Lopez MRN: [id number] Date of Birth: 1986-01-28

## 2018-05-02 DIAGNOSIS — Z170 Estrogen receptor positive status [ER+]: Secondary | ICD-10-CM | POA: Diagnosis not present

## 2018-05-02 DIAGNOSIS — C50811 Malignant neoplasm of overlapping sites of right female breast: Secondary | ICD-10-CM | POA: Diagnosis not present

## 2018-05-02 DIAGNOSIS — Z5111 Encounter for antineoplastic chemotherapy: Secondary | ICD-10-CM | POA: Diagnosis not present

## 2018-05-09 DIAGNOSIS — Z5111 Encounter for antineoplastic chemotherapy: Secondary | ICD-10-CM | POA: Diagnosis not present

## 2018-05-09 DIAGNOSIS — C50811 Malignant neoplasm of overlapping sites of right female breast: Secondary | ICD-10-CM | POA: Diagnosis not present

## 2018-05-09 DIAGNOSIS — Z170 Estrogen receptor positive status [ER+]: Secondary | ICD-10-CM | POA: Diagnosis not present

## 2018-05-10 DIAGNOSIS — I972 Postmastectomy lymphedema syndrome: Secondary | ICD-10-CM | POA: Diagnosis not present

## 2018-05-10 DIAGNOSIS — C50111 Malignant neoplasm of central portion of right female breast: Secondary | ICD-10-CM | POA: Diagnosis not present

## 2018-05-10 DIAGNOSIS — Z4431 Encounter for fitting and adjustment of external right breast prosthesis: Secondary | ICD-10-CM | POA: Diagnosis not present

## 2018-05-12 ENCOUNTER — Ambulatory Visit: Payer: BLUE CROSS/BLUE SHIELD | Attending: Physician Assistant | Admitting: Occupational Therapy

## 2018-05-12 DIAGNOSIS — I972 Postmastectomy lymphedema syndrome: Secondary | ICD-10-CM

## 2018-05-12 DIAGNOSIS — M25611 Stiffness of right shoulder, not elsewhere classified: Secondary | ICD-10-CM | POA: Diagnosis not present

## 2018-05-12 DIAGNOSIS — G8929 Other chronic pain: Secondary | ICD-10-CM | POA: Insufficient documentation

## 2018-05-12 DIAGNOSIS — M5441 Lumbago with sciatica, right side: Secondary | ICD-10-CM | POA: Insufficient documentation

## 2018-05-12 NOTE — Patient Instructions (Signed)
Cont with tubigrip on arm over isotoner glove - one short stretch bandage over it at night time  And Komprex foam over R trunk under bra - but wear it some more during day too - not only night time  Contact me if custom compression garments come in prior to next weeks appt

## 2018-05-12 NOTE — Therapy (Signed)
[facility] [address], [place], [address] Phone: [phone]   Fax:  [phone]  Occupational Therapy Treatment  Patient Details  Name: Mckenzie Lopez MRN: [id number] Date of Birth: 10-09-1985 Referring Provider (OT): Garlan Fair   Encounter Date: 05/12/2018  OT End of Session - 05/12/18 0912    Visit Number  7    Number of Visits  12    Date for OT Re-Evaluation  06/16/18    OT Start Time  0834    OT Stop Time  0907    OT Time Calculation (min)  33 min    Activity Tolerance  Patient tolerated treatment well    Behavior During Therapy  [facility] for tasks assessed/performed       Past Medical History:  Diagnosis Date  . Cancer ([place]) 07/252019   breast  . CHF (congestive heart failure) ([place])    POSTPARTUM 2017/ RESOLVED  . Eczema   . 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 Hyperlipidemia   . Hypertension   . IFG (impaired fasting glucose)   . Low back pain   . Migraines   . Morbid obesity ([place])     Past Surgical History:  Procedure Laterality Date  . AXILLARY LYMPH NODE BIOPSY Right 12/01/2017   METASTATIC MAMMARY CARCINOMA  . AXILLARY LYMPH NODE BIOPSY Left 12/01/2017   NEGATIVE FOR MALIGNANCY  . BREAST BIOPSY Right 12/01/2017   11 and 12 o'clock, INVASIVE MAMMARY CARCINOMA, ER/PR positive HER2 negative    There were no vitals filed for this visit.  Subjective Assessment - 05/12/18 0907    Subjective   It fell tight and swollen under my arm - some days better than others - I have my mom help me put in that compression pad - and that helps some - and she done the one bandage over my white sleeves at night time - but did not wear to much during day - hand feels okay     Patient Stated Goals  I want to keep the lympedema under control     Currently in Pain?  No/denies          LYMPHEDEMA/ONCOLOGY QUESTIONNAIRE - 05/12/18 0839      Right Upper Extremity Lymphedema   15 cm  Proximal to Olecranon Process  49 cm    10 cm Proximal to Olecranon Process  44.5 cm    Olecranon Process  33 cm    15 cm Proximal to Ulnar Styloid Process  32.5 cm    10 cm Proximal to Ulnar Styloid Process  27 cm    Just Proximal to Ulnar Styloid Process  23 cm    Across Hand at [employer]  24 cm    At [place] of 2nd Digit  8 cm    At [facility] of Thumb  8.2 cm        Pt  R UE measurements taken  See flow sheet - upper arm and wrist increase - pt do report some increase feeling at times under arm area    Pt wear Komprex foam  under arm and breast and scapula on the R - pt report helps for tightness and swelling under arm - await [employer] breast pad - pt to wear it during day too  She has 5 session of chemo left   And then wear isotoner glove night time , soft tubigrip on hand to elbow and tubigrip F from forearm to upper arm  -  new ones cut this date  AND to cont with  one 8 cm short stretch bandage to hand to upper arm for night time over tubigrip  Per DME company garments were ordered                  OT Education - 05/12/18 0912    Education Details  cont with  one layer of bandages over tubigrip at night time  and trunkle Komprex foam more during day too     Person(s) Educated  Patient    Methods  Explanation;Demonstration;Handout    Comprehension  Verbalized understanding;Returned demonstration          OT Long Term Goals - 03/17/18 1906      OT LONG TERM GOAL #1   Title  Pt to be independent in self MLD  and wearing correct compression to maintain lymphedema under control     Baseline  no knowledge of MLD and L UE circumference increase by 11 am in hand by 3.5 cm wris and forearm 1.2 cm - ? if still stage 1     Time  4    Period  Weeks    Status  New    Target Date  04/14/18      OT LONG TERM GOAL #2   Title  Monitor lymphedema in L UE  for pt to maintain or change homeprogram  through out chemo to keep lymphedema under control     Time  12     Period  Weeks    Status  New    Target Date  06/16/18            Plan - 05/12/18 0913    Clinical Impression Statement  Pt show R UE lymphedema circumference are still increase compare to L b- but upper arm increase this date more and pt report increase trunkle tightness - lymphedema - pt to wear komprex foam during day too - custom garments should coming in the next week - with wrist and upper arm  the worse - pt to cont with  one layer  short stretch bandage to R UE  at night time over tubigrip for hand to upper arm while waiting for her compression garments     Occupational performance deficits (Please refer to evaluation for details):  IADL's;Work;Leisure;Play    Rehab Potential  Good    Current Impairments/barriers affecting progress:  chronic conditions     OT Frequency  1x / week    OT Duration  6 weeks    OT Treatment/Interventions  Self-care/ADL training;Manual lymph drainage;Patient/family education;Therapeutic exercise    Plan  assess circumferencd and custom garments fit if in     Clinical Decision Making  Limited treatment options, no task modification necessary    OT Home Exercise Plan  see pt instruction     Consulted and Agree with Plan of Care  Patient       Patient will benefit from skilled therapeutic intervention in order to improve the following deficits and impairments:  Impaired flexibility, Increased edema, Impaired UE functional use, Decreased knowledge of precautions  Visit Diagnosis: Postmastectomy lymphedema syndrome  Stiffness of right shoulder, not elsewhere classified  Chronic right-sided low back pain with right-sided sciatica    Problem List Patient Active Problem List   Diagnosis Date Noted  . Malignant neoplasm of upper-outer quadrant of right breast in female, estrogen receptor positive ([place]) 12/07/2017  . Allergic conjunctivitis of both eyes 09/16/2017  . Family history of  breast cancer 10/13/2016  . Dysplasia of cervix, low grade (CIN  1) 10/13/2016  . Thrombocytosis ([place]) 03/16/2016  . 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 Hematuria 02/21/2015  . Vitamin D deficiency 02/21/2015  . Sterilization consult 02/21/2015  . Low back pain   . Morbid obesity ([place])   . Hyperlipidemia   . Hypertension   . IFG (impaired fasting glucose)   . Migraines     DuPreez, Maureen OTR/L,CLT 05/12/2018, 9:15 AM  [facility] [address] [place], [address] Phone: [phone]   Fax:  [phone]  Name: Jonell Season Astacio MRN: [id number] Date of Birth: Dec 12, 1985

## 2018-05-16 DIAGNOSIS — Z170 Estrogen receptor positive status [ER+]: Secondary | ICD-10-CM | POA: Diagnosis not present

## 2018-05-16 DIAGNOSIS — Z5111 Encounter for antineoplastic chemotherapy: Secondary | ICD-10-CM | POA: Diagnosis not present

## 2018-05-16 DIAGNOSIS — C50811 Malignant neoplasm of overlapping sites of right female breast: Secondary | ICD-10-CM | POA: Diagnosis not present

## 2018-05-20 ENCOUNTER — Ambulatory Visit: Payer: BLUE CROSS/BLUE SHIELD | Admitting: Occupational Therapy

## 2018-05-20 DIAGNOSIS — G8929 Other chronic pain: Secondary | ICD-10-CM | POA: Diagnosis not present

## 2018-05-20 DIAGNOSIS — M5441 Lumbago with sciatica, right side: Secondary | ICD-10-CM | POA: Diagnosis not present

## 2018-05-20 DIAGNOSIS — M25611 Stiffness of right shoulder, not elsewhere classified: Secondary | ICD-10-CM | POA: Diagnosis not present

## 2018-05-20 DIAGNOSIS — I972 Postmastectomy lymphedema syndrome: Secondary | ICD-10-CM | POA: Diagnosis not present

## 2018-05-20 NOTE — Therapy (Signed)
[facility] [address], [place], [address] Phone: [phone]   Fax:  [phone]  Occupational Therapy Treatment  Patient Details  Name: Mckenzie Lopez MRN: [id number] Date of Birth: Feb 18, 1986 Referring Provider (OT): Garlan Fair   Encounter Date: 05/20/2018  OT End of Session - 05/20/18 1532    Visit Number  8    Number of Visits  12    Date for OT Re-Evaluation  06/16/18    OT Start Time  0935    OT Stop Time  1008    OT Time Calculation (min)  33 min    Activity Tolerance  Patient tolerated treatment well    Behavior During Therapy  [facility] for tasks assessed/performed       Past Medical History:  Diagnosis Date  . Cancer ([place]) 07/252019   breast  . CHF (congestive heart failure) ([place])    POSTPARTUM 2017/ RESOLVED  . Eczema   . 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 Hyperlipidemia   . Hypertension   . IFG (impaired fasting glucose)   . Low back pain   . Migraines   . Morbid obesity ([place])     Past Surgical History:  Procedure Laterality Date  . AXILLARY LYMPH NODE BIOPSY Right 12/01/2017   METASTATIC MAMMARY CARCINOMA  . AXILLARY LYMPH NODE BIOPSY Left 12/01/2017   NEGATIVE FOR MALIGNANCY  . BREAST BIOPSY Right 12/01/2017   11 and 12 o'clock, INVASIVE MAMMARY CARCINOMA, ER/PR positive HER2 negative    There were no vitals filed for this visit.  Subjective Assessment - 05/20/18 1530    Subjective   I am doing okay- chemo good - some numbness more in my R fingers than L - still did not hear anything about my compression     Patient Stated Goals  I want to keep the lympedema under control     Currently in Pain?  No/denies          LYMPHEDEMA/ONCOLOGY QUESTIONNAIRE - 05/20/18 0949      Right Upper Extremity Lymphedema   15 cm Proximal to Olecranon Process  49 cm    10 cm Proximal to Olecranon Process  44.5 cm    Olecranon Process  33 cm    15 cm Proximal to Ulnar  Styloid Process  31.8 cm    10 cm Proximal to Ulnar Styloid Process  26.8 cm    Just Proximal to Ulnar Styloid Process  22.8 cm    Across Hand at [employer]  23.5 cm    At [place] of 2nd Digit  7.5 cm    At [facility] of Thumb  8 cm         Pt  R UE measurements taken See flow sheet - proximal upper arm  increase - pt do report some increase feeling at times under arm area - and tubi grip at night time roll   Pt wear Komprex foam  under arm and breast and scapula on the R - pt report helps for tightness and swelling under arm -contact DME -  Jov[employer]ak breast pad is in - pt to pick up  - pt to wear it during day too  She has 4 session of chemo left   And then wear isotoner glove night time , soft tubigrip on hand to elbow and tubigrip F from forearm to upper arm  - new ones cut this date - it rolls at top - provided wide velcro  to prevent rolling  AND to cont with  one 8 cm short stretch bandage to hand to upper arm for night time over tubigrip as needed  Per DME company garments will be in next week                OT Education - 05/20/18 1531    Education Details  cont with  one layer of bandages over tubigrip at night time  and trunkle jovipak more during day too     Person(s) Educated  Patient    Methods  Explanation;Demonstration;Handout    Comprehension  Verbalized understanding;Returned demonstration          OT Long Term Goals - 03/17/18 1906      OT LONG TERM GOAL #1   Title  Pt to be independent in self MLD  and wearing correct compression to maintain lymphedema under control     Baseline  no knowledge of MLD and L UE circumference increase by 11 am in hand by 3.5 cm wris and forearm 1.2 cm - ? if still stage 1     Time  4    Period  Weeks    Status  New    Target Date  04/14/18      OT LONG TERM GOAL #2   Title  Monitor lymphedema in L UE  for pt to maintain or change homeprogram  through out chemo to keep lymphedema under control     Time  12     Period  Weeks    Status  New    Target Date  06/16/18            Plan - 05/20/18 1532    Clinical Impression Statement  Pt show R UE lymphedema still increase but mostly in upper arm - provided velcro to prevent tubigrip from rolling -DME company says  [employer] el[employer] soft sleeve adn glove should be in next week - but unilataral jovipak is in - pt to wear as much as she can because of upper arm and under arm lymphedema worse - pt to cont temp compression until sleeve come in -and will fax info to pump DME for pump to be use for trunkle lymphedema     Occupational performance deficits (Please refer to evaluation for details):  IADL's;Work;Leisure;Play    Current Impairments/barriers affecting progress:  chronic conditions     OT Frequency  1x / week    OT Duration  6 weeks    OT Treatment/Interventions  Self-care/ADL training;Manual lymph drainage;Patient/family education;Therapeutic exercise    Plan  assess circumferencd and custom garments fit if in     Clinical Decision Making  Limited treatment options, no task modification necessary    OT Home Exercise Plan  see pt instruction     Consulted and Agree with Plan of Care  Patient       Patient will benefit from skilled therapeutic intervention in order to improve the following deficits and impairments:  Impaired flexibility, Increased edema, Impaired UE functional use, Decreased knowledge of precautions  Visit Diagnosis: Postmastectomy lymphedema syndrome  Stiffness of right shoulder, not elsewhere classified  Chronic right-sided low back pain with right-sided sciatica    Problem List Patient Active Problem List   Diagnosis Date Noted  . Malignant neoplasm of upper-outer quadrant of right breast in female, estrogen receptor positive ([place]) 12/07/2017  . Allergic conjunctivitis of both eyes 09/16/2017  . Family history of breast cancer 10/13/2016  . Dysplasia of cervix, low grade (  CIN 1) 10/13/2016  . Thrombocytosis ([place])  03/16/2016  . 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 Hematuria 02/21/2015  . Vitamin D deficiency 02/21/2015  . Sterilization consult 02/21/2015  . Low back pain   . Morbid obesity ([place])   . Hyperlipidemia   . Hypertension   . IFG (impaired fasting glucose)   . Migraines     DuPreez, Maureen OTR/L,CLT 05/20/2018, 3:37 PM  [facility] [address], [place], [address] Phone: [phone]   Fax:  [phone]  Name: Mckenzie Lopez MRN: [id number] Date of Birth: 12-31-1985

## 2018-05-20 NOTE — Patient Instructions (Signed)
Wear jovipak as much as can -  And tubigrip compression on R UE in night time and as needed in day

## 2018-05-23 DIAGNOSIS — R200 Anesthesia of skin: Secondary | ICD-10-CM | POA: Diagnosis not present

## 2018-05-23 DIAGNOSIS — R197 Diarrhea, unspecified: Secondary | ICD-10-CM | POA: Diagnosis not present

## 2018-05-23 DIAGNOSIS — C50811 Malignant neoplasm of overlapping sites of right female breast: Secondary | ICD-10-CM | POA: Diagnosis not present

## 2018-05-23 DIAGNOSIS — R5383 Other fatigue: Secondary | ICD-10-CM | POA: Diagnosis not present

## 2018-05-23 DIAGNOSIS — C773 Secondary and unspecified malignant neoplasm of axilla and upper limb lymph nodes: Secondary | ICD-10-CM | POA: Diagnosis not present

## 2018-05-23 DIAGNOSIS — Z5111 Encounter for antineoplastic chemotherapy: Secondary | ICD-10-CM | POA: Diagnosis not present

## 2018-05-23 DIAGNOSIS — Z9011 Acquired absence of right breast and nipple: Secondary | ICD-10-CM | POA: Diagnosis not present

## 2018-05-23 DIAGNOSIS — C50411 Malignant neoplasm of upper-outer quadrant of right female breast: Secondary | ICD-10-CM | POA: Diagnosis not present

## 2018-05-23 DIAGNOSIS — K219 Gastro-esophageal reflux disease without esophagitis: Secondary | ICD-10-CM | POA: Diagnosis not present

## 2018-05-23 DIAGNOSIS — I890 Lymphedema, not elsewhere classified: Secondary | ICD-10-CM | POA: Diagnosis not present

## 2018-05-23 DIAGNOSIS — Z170 Estrogen receptor positive status [ER+]: Secondary | ICD-10-CM | POA: Diagnosis not present

## 2018-05-23 DIAGNOSIS — G629 Polyneuropathy, unspecified: Secondary | ICD-10-CM | POA: Diagnosis not present

## 2018-05-23 DIAGNOSIS — D649 Anemia, unspecified: Secondary | ICD-10-CM | POA: Diagnosis not present

## 2018-05-27 ENCOUNTER — Ambulatory Visit: Payer: BLUE CROSS/BLUE SHIELD | Admitting: Occupational Therapy

## 2018-05-27 DIAGNOSIS — I972 Postmastectomy lymphedema syndrome: Secondary | ICD-10-CM | POA: Diagnosis not present

## 2018-05-27 DIAGNOSIS — G8929 Other chronic pain: Secondary | ICD-10-CM

## 2018-05-27 DIAGNOSIS — M5441 Lumbago with sciatica, right side: Secondary | ICD-10-CM | POA: Diagnosis not present

## 2018-05-27 DIAGNOSIS — M25611 Stiffness of right shoulder, not elsewhere classified: Secondary | ICD-10-CM | POA: Diagnosis not present

## 2018-05-27 NOTE — Therapy (Signed)
[facility] [address], [place], [address] Phone: [phone]   Fax:  [phone]  Occupational Therapy Treatment  Patient Details  Name: Mckenzie Lopez MRN: [id number] Date of Birth: 1985-10-11 Referring Provider (OT): Garlan Fair   Encounter Date: 05/27/2018  OT End of Session - 05/27/18 1023    Visit Number  9    Number of Visits  12    Date for OT Re-Evaluation  06/16/18    OT Start Time  0945    OT Stop Time  1010    OT Time Calculation (min)  25 min    Activity Tolerance  Patient tolerated treatment well    Behavior During Therapy  [facility] for tasks assessed/performed       Past Medical History:  Diagnosis Date  . Cancer ([place]) 07/252019   breast  . CHF (congestive heart failure) ([place])    POSTPARTUM 2017/ RESOLVED  . Eczema   . Hemoglobin C trait ([place]) 11/29/2015  . Hyperlipidemia   . Hypertension   . IFG (impaired fasting glucose)   . Low back pain   . Migraines   . Morbid obesity ([place])     Past Surgical History:  Procedure Laterality Date  . AXILLARY LYMPH NODE BIOPSY Right 12/01/2017   METASTATIC MAMMARY CARCINOMA  . AXILLARY LYMPH NODE BIOPSY Left 12/01/2017   NEGATIVE FOR MALIGNANCY  . BREAST BIOPSY Right 12/01/2017   11 and 12 o'clock, INVASIVE MAMMARY CARCINOMA, ER/PR positive HER2 negative    There were no vitals filed for this visit.  Subjective Assessment - 05/27/18 1020    Subjective   I have 3 more chemo sessions - then radiation - doing okay - I thought my hand was down until about Wed - did pick up my sleeve yesterday and wore it about 6 hours - felt good     Pertinent History   R modified radical mastectomy with lymph node dissection; negative margins; 5 of 21 lymph nodes positive for metastatic carcinoma;01/31/18 Chemotherapy Initiated: Dose-dense doxorubicin-cyclophosphamide (ddAC) q2 weeks x 4 cycles followed by weekly paclitaxel (weekly-T).    Patient  Stated Goals  I want to keep the lympedema under control     Currently in Pain?  No/denies          LYMPHEDEMA/ONCOLOGY QUESTIONNAIRE - 05/27/18 0947      Right Upper Extremity Lymphedema   15 cm Proximal to Olecranon Process  49.5 cm    10 cm Proximal to Olecranon Process  45.5 cm    Olecranon Process  33 cm    15 cm Proximal to Ulnar Styloid Process  32 cm    10 cm Proximal to Ulnar Styloid Process  27.5 cm    Just Proximal to Ulnar Styloid Process  21.1 cm    Across Hand at [employer]  23 cm    At [place] of 2nd Digit  7.9 cm    At [facility] of Thumb  8 cm         Assess circumference - see flow sheet- still fluctuate - upper arm and foreram from [facility]  Upper arm did increase mostly from [facility] - jovipak pt ed on wearing correctly and to wear as much as can to clear trunkle lymphedema to provided space for upper arm lymphedema to drain  Recommend strongly pump with trunkle vest   Pt ed on wearing of custom Jobst Elvarex soft sleeve and glove   Glove fits great - sleeve  looked great - but top band is narrow and length not into 2 fingers out of axilla - sleeve is sliding  Did contact Rep to see if we can get the sleeve made with wider band and closer to  axilla   Pt to cont with night time temp compression until pump in place              OT Education - 05/27/18 1023    Education Details  cont with  one layer of bandages over tubigrip at night time ; new daytime sleeve and glove and  trunkle jovipak as much as she can    Person(s) Educated  Patient    Methods  Explanation;Demonstration;Handout    Comprehension  Verbalized understanding;Returned demonstration          OT Long Term Goals - 03/17/18 1906      OT LONG TERM GOAL #1   Title  Pt to be independent in self MLD  and wearing correct compression to maintain lymphedema under control     Baseline  no knowledge of MLD and L UE circumference increase by 11 am in hand by 3.5 cm wris and forearm 1.2 cm - ? if  still stage 1     Time  4    Period  Weeks    Status  New    Target Date  04/14/18      OT LONG TERM GOAL #2   Title  Monitor lymphedema in L UE  for pt to maintain or change homeprogram  through out chemo to keep lymphedema under control     Time  12    Period  Weeks    Status  New    Target Date  06/16/18            Plan - 05/27/18 1024    Clinical Impression Statement  Pt arrive with new daytime custom Job[employer] Elvarex sleeve and glove - pt ed on donning and doffing /wearing - top band to narrow and not  fingers out of axilla - sleeve sliding some- contact DME company to redo top band to prevent sliding - glove fits well -and pt ed wearing  jovipak  correctly - pt to return in week and 1/2 - for remeasure - upper arm and forearm still fluctaute     Occupational performance deficits (Please refer to evaluation for details):  IADL's;Work;Leisure;Play    Rehab Potential  Good    Current Impairments/barriers affecting progress:  chronic conditions     OT Frequency  1x / week    OT Duration  6 weeks    OT Treatment/Interventions  Self-care/ADL training;Manual lymph drainage;Patient/family education;Therapeutic exercise    Plan  assess circumferencd and  use of custom garments wearing - check on pump with REP    Clinical Decision Making  Limited treatment options, no task modification necessary    OT Home Exercise Plan  see pt instruction     Consulted and Agree with Plan of Care  Patient       Patient will benefit from skilled therapeutic intervention in order to improve the following deficits and impairments:  Impaired flexibility, Increased edema, Impaired UE functional use, Decreased knowledge of precautions  Visit Diagnosis: Postmastectomy lymphedema syndrome  Stiffness of right shoulder, not elsewhere classified  Chronic right-sided low back pain with right-sided sciatica    Problem List Patient Active Problem List   Diagnosis Date Noted  . Malignant neoplasm of  upper-outer quadrant of right breast in female,  estrogen receptor positive ([place]) 12/07/2017  . Allergic conjunctivitis of both eyes 09/16/2017  . Family history of breast cancer 10/13/2016  . Dysplasia of cervix, low grade (CIN 1) 10/13/2016  . Thrombocytosis ([place]) 03/16/2016  . Hemoglobin C trait ([place]) 11/29/2015  . Hematuria 02/21/2015  . Vitamin D deficiency 02/21/2015  . Sterilization consult 02/21/2015  . Low back pain   . Morbid obesity ([place])   . Hyperlipidemia   . Hypertension   . IFG (impaired fasting glucose)   . Migraines     Latarshia Jersey OTR/L,CLT 05/27/2018, 10:29 AM  [facility] [address] [place], [address] Phone: [phone]   Fax:  [phone]  Name: Mckenzie Lopez MRN: [id number] Date of Birth: 1985-09-14

## 2018-05-27 NOTE — Patient Instructions (Signed)
Wear her daytime Jobst Elvarex soft glove and sleeve during day  And jovi pak as much as she can  Night time - isotoner glove, and soft tubigrip/and tubigrip E

## 2018-05-31 DIAGNOSIS — C50811 Malignant neoplasm of overlapping sites of right female breast: Secondary | ICD-10-CM | POA: Diagnosis not present

## 2018-05-31 DIAGNOSIS — Z5111 Encounter for antineoplastic chemotherapy: Secondary | ICD-10-CM | POA: Diagnosis not present

## 2018-05-31 DIAGNOSIS — Z170 Estrogen receptor positive status [ER+]: Secondary | ICD-10-CM | POA: Diagnosis not present

## 2018-05-31 DIAGNOSIS — Z79899 Other long term (current) drug therapy: Secondary | ICD-10-CM | POA: Diagnosis not present

## 2018-06-05 ENCOUNTER — Other Ambulatory Visit: Payer: Self-pay | Admitting: Family Medicine

## 2018-06-06 DIAGNOSIS — Z5111 Encounter for antineoplastic chemotherapy: Secondary | ICD-10-CM | POA: Diagnosis not present

## 2018-06-06 DIAGNOSIS — Z170 Estrogen receptor positive status [ER+]: Secondary | ICD-10-CM | POA: Diagnosis not present

## 2018-06-06 DIAGNOSIS — C50811 Malignant neoplasm of overlapping sites of right female breast: Secondary | ICD-10-CM | POA: Diagnosis not present

## 2018-06-07 ENCOUNTER — Ambulatory Visit: Payer: BLUE CROSS/BLUE SHIELD | Admitting: Occupational Therapy

## 2018-06-07 DIAGNOSIS — M25611 Stiffness of right shoulder, not elsewhere classified: Secondary | ICD-10-CM | POA: Diagnosis not present

## 2018-06-07 DIAGNOSIS — I972 Postmastectomy lymphedema syndrome: Secondary | ICD-10-CM

## 2018-06-07 DIAGNOSIS — M5441 Lumbago with sciatica, right side: Secondary | ICD-10-CM | POA: Diagnosis not present

## 2018-06-07 DIAGNOSIS — G8929 Other chronic pain: Secondary | ICD-10-CM | POA: Diagnosis not present

## 2018-06-07 NOTE — Therapy (Signed)
[facility] [address] [place], [address] Phone: [phone]   Fax:  [phone]  Occupational Therapy Treatment  Patient Details  Name: Mckenzie Lopez MRN: [id number] Date of Birth: 04/15/1986 Referring Provider (OT): Garlan Fair   Encounter Date: 06/07/2018  OT End of Session - 06/07/18 1028    Visit Number  10    Number of Visits  12    Date for OT Re-Evaluation  06/16/18    OT Start Time  0950    OT Stop Time  1017    OT Time Calculation (min)  27 min    Activity Tolerance  Patient tolerated treatment well    Behavior During Therapy  [facility] for tasks assessed/performed       Past Medical History:  Diagnosis Date  . Cancer ([place]) 07/252019   breast  . CHF (congestive heart failure) ([place])    POSTPARTUM 2017/ RESOLVED  . Eczema   . Hemoglobin C trait ([place]) 11/29/2015  . Hyperlipidemia   . Hypertension   . IFG (impaired fasting glucose)   . Low back pain   . Migraines   . Morbid obesity ([place])     Past Surgical History:  Procedure Laterality Date  . AXILLARY LYMPH NODE BIOPSY Right 12/01/2017   METASTATIC MAMMARY CARCINOMA  . AXILLARY LYMPH NODE BIOPSY Left 12/01/2017   NEGATIVE FOR MALIGNANCY  . BREAST BIOPSY Right 12/01/2017   11 and 12 o'clock, INVASIVE MAMMARY CARCINOMA, ER/PR positive HER2 negative    There were no vitals filed for this visit.  Subjective Assessment - 06/07/18 1025    Subjective   I'm having my last chemo this coming Monday - my arm swelling felt good this past week wtih daytime garment and glove but today it feel more swollen since yesterdays chemo - and my fingers are feeling more numb - I do wear my isotoner glove about 50% of day - just getting use to my new compression glove     Patient Stated Goals  I want to keep the lympedema under control     Currently in Pain?  No/denies          LYMPHEDEMA/ONCOLOGY QUESTIONNAIRE - 06/07/18 1001      Right  Upper Extremity Lymphedema   15 cm Proximal to Olecranon Process  50 cm    10 cm Proximal to Olecranon Process  46.3 cm    Olecranon Process  33.6 cm    15 cm Proximal to Ulnar Styloid Process  32.3 cm    10 cm Proximal to Ulnar Styloid Process  27.5 cm    Just Proximal to Ulnar Styloid Process  23 cm    Across Hand at [employer]  24.8 cm    At [place] of 2nd Digit  7.7 cm    At [facility] of Thumb  8 cm         Assess circumference - see flow sheet- increase compare to last time - but had chemo yesterday  Donn  jovipak for pt - and pt ed on wearing correctly and to wear as much as can to clear trunkle lymphedema to provided space for upper arm lymphedema to drain  Recommend strongly pump with trunkle vest - but insurance deductible not met- will get her temp pump for arm - will be fitted this week per Rep   Pt ed on wearing of custom Jobst Elvarex soft sleeve and glove   Glove fits great - sleeve  looked great -  They are ordering her one with wide band - narrow band makes  Sleeve slide some    Pt to cont with night time temp compression until pump in place           Cont with daytime compression sleeve and glove - will be fitted with a temporary pump - to use 45-60 min in am and pm  And cont with tubigrip and isotoner glove at night time       OT Education - 06/07/18 1027    Education Details  use of compression and did reorder wider band on daysleeve    Person(s) Educated  Patient    Methods  Explanation;Demonstration;Handout    Comprehension  Verbalized understanding;Returned demonstration          OT Long Term Goals - 03/17/18 1906      OT LONG TERM GOAL #1   Title  Pt to be independent in self MLD  and wearing correct compression to maintain lymphedema under control     Baseline  no knowledge of MLD and L UE circumference increase by 11 am in hand by 3.5 cm wris and forearm 1.2 cm - ? if still stage 1     Time  4    Period  Weeks    Status  New    Target  Date  04/14/18      OT LONG TERM GOAL #2   Title  Monitor lymphedema in L UE  for pt to maintain or change homeprogram  through out chemo to keep lymphedema under control     Time  12    Period  Weeks    Status  New    Target Date  06/16/18            Plan - 06/07/18 1028    Clinical Impression Statement  Pt arrive with her garments off- her mom could not help her this am to donn - pt measurement increase some from hand to upper arm - but had chemo yesterday - pt is getting fit with temporary pump this week in use in combination with her daytime garmenst - and will hopely decrease her measurements prior to starting radiation - she is meeting with radiation on 7th Febr     Occupational performance deficits (Please refer to evaluation for details):  IADL's;Work;Leisure;Play    Rehab Potential  Good    Current Impairments/barriers affecting progress:  chronic conditions     OT Frequency  Biweekly    OT Duration  4 weeks    OT Treatment/Interventions  Self-care/ADL training;Manual lymph drainage;Patient/family education;Therapeutic exercise    Plan  assess circumferencd and  use of custom garments and pump     Clinical Decision Making  Limited treatment options, no task modification necessary    OT Home Exercise Plan  see pt instruction     Consulted and Agree with Plan of Care  Patient       Patient will benefit from skilled therapeutic intervention in order to improve the following deficits and impairments:  Impaired flexibility, Increased edema, Impaired UE functional use, Decreased knowledge of precautions  Visit Diagnosis: Postmastectomy lymphedema syndrome    Problem List Patient Active Problem List   Diagnosis Date Noted  . Malignant neoplasm of upper-outer quadrant of right breast in female, estrogen receptor positive ([place]) 12/07/2017  . Allergic conjunctivitis of both eyes 09/16/2017  . Family history of breast cancer 10/13/2016  . Dysplasia of cervix, low grade (CIN  1) 10/13/2016  . Thrombocytosis ([place]) 03/16/2016  . Hemoglobin C trait ([place]) 11/29/2015  . Hematuria 02/21/2015  . Vitamin D deficiency 02/21/2015  . Sterilization consult 02/21/2015  . Low back pain   . Morbid obesity ([place])   . Hyperlipidemia   . Hypertension   . IFG (impaired fasting glucose)   . Migraines     DuPreez, Maureen OTR/L,CLT 06/07/2018, 10:34 AM  [facility] [address] [place], [address] Phone: [phone]   Fax:  [phone]  Name: Jaionna Kenlyn Lose MRN: [id number] Date of Birth: April 21, 1986

## 2018-06-07 NOTE — Patient Instructions (Signed)
Cont with daytime compression sleeve and glove - will be fitted with a temporary pump - to use 45-60 min in am and pm  And cont with tubigrip and isotoner glove at night time

## 2018-06-13 DIAGNOSIS — M25652 Stiffness of left hip, not elsewhere classified: Secondary | ICD-10-CM | POA: Diagnosis not present

## 2018-06-13 DIAGNOSIS — D649 Anemia, unspecified: Secondary | ICD-10-CM | POA: Diagnosis not present

## 2018-06-13 DIAGNOSIS — R200 Anesthesia of skin: Secondary | ICD-10-CM | POA: Diagnosis not present

## 2018-06-13 DIAGNOSIS — M25651 Stiffness of right hip, not elsewhere classified: Secondary | ICD-10-CM | POA: Diagnosis not present

## 2018-06-13 DIAGNOSIS — Z170 Estrogen receptor positive status [ER+]: Secondary | ICD-10-CM | POA: Diagnosis not present

## 2018-06-13 DIAGNOSIS — G629 Polyneuropathy, unspecified: Secondary | ICD-10-CM | POA: Diagnosis not present

## 2018-06-13 DIAGNOSIS — I890 Lymphedema, not elsewhere classified: Secondary | ICD-10-CM | POA: Diagnosis not present

## 2018-06-13 DIAGNOSIS — Z5111 Encounter for antineoplastic chemotherapy: Secondary | ICD-10-CM | POA: Diagnosis not present

## 2018-06-13 DIAGNOSIS — C50811 Malignant neoplasm of overlapping sites of right female breast: Secondary | ICD-10-CM | POA: Diagnosis not present

## 2018-06-13 DIAGNOSIS — C50411 Malignant neoplasm of upper-outer quadrant of right female breast: Secondary | ICD-10-CM | POA: Diagnosis not present

## 2018-06-13 DIAGNOSIS — Z9011 Acquired absence of right breast and nipple: Secondary | ICD-10-CM | POA: Diagnosis not present

## 2018-06-16 ENCOUNTER — Ambulatory Visit: Payer: BLUE CROSS/BLUE SHIELD | Attending: Physician Assistant | Admitting: Occupational Therapy

## 2018-06-16 DIAGNOSIS — G8929 Other chronic pain: Secondary | ICD-10-CM | POA: Diagnosis not present

## 2018-06-16 DIAGNOSIS — M5441 Lumbago with sciatica, right side: Secondary | ICD-10-CM | POA: Insufficient documentation

## 2018-06-16 DIAGNOSIS — I972 Postmastectomy lymphedema syndrome: Secondary | ICD-10-CM | POA: Diagnosis not present

## 2018-06-16 DIAGNOSIS — M25611 Stiffness of right shoulder, not elsewhere classified: Secondary | ICD-10-CM | POA: Diagnosis not present

## 2018-06-16 NOTE — Therapy (Signed)
[facility] [address], [place], [address] Phone: [phone]   Fax:  [phone]  Occupational Therapy Treatment  Patient Details  Name: Mckenzie Lopez MRN: [id number] Date of Birth: 1985-12-17 Referring Provider (OT): Garlan Fair   Encounter Date: 06/16/2018  OT End of Session - 06/16/18 1356    Visit Number  11    Number of Visits  15    Date for OT Re-Evaluation  08/11/18    OT Start Time  0940    OT Stop Time  1008    OT Time Calculation (min)  28 min    Activity Tolerance  Patient tolerated treatment well    Behavior During Therapy  [facility] for tasks assessed/performed       Past Medical History:  Diagnosis Date  . Cancer ([place]) 07/252019   breast  . CHF (congestive heart failure) ([place])    POSTPARTUM 2017/ RESOLVED  . Eczema   . Hemoglobin C trait ([place]) 11/29/2015  . Hyperlipidemia   . Hypertension   . IFG (impaired fasting glucose)   . Low back pain   . Migraines   . Morbid obesity ([place])     Past Surgical History:  Procedure Laterality Date  . AXILLARY LYMPH NODE BIOPSY Right 12/01/2017   METASTATIC MAMMARY CARCINOMA  . AXILLARY LYMPH NODE BIOPSY Left 12/01/2017   NEGATIVE FOR MALIGNANCY  . BREAST BIOPSY Right 12/01/2017   11 and 12 o'clock, INVASIVE MAMMARY CARCINOMA, ER/PR positive HER2 negative    There were no vitals filed for this visit.  Subjective Assessment - 06/16/18 1353    Subjective   I am done with chemo - appt with radiation Friday -I just picked up my sleeve with wide band -and it feels much better and I am loving the tempary pump - I met my deductible  - so are getting me pump with the chest /or vest part     Patient Stated Goals  I want to keep the lympedema under control     Currently in Pain?  No/denies          LYMPHEDEMA/ONCOLOGY QUESTIONNAIRE - 06/16/18 0949      Right Upper Extremity Lymphedema   15 cm Proximal to Olecranon Process  49.5 cm     10 cm Proximal to Olecranon Process  46.3 cm    Olecranon Process  33.3 cm    15 cm Proximal to Ulnar Styloid Process  33.4 cm    10 cm Proximal to Ulnar Styloid Process  28 cm    Just Proximal to Ulnar Styloid Process  22.1 cm    Across Hand at [employer]  23.3 cm    At [place] of 2nd Digit  7.8 cm    At [facility] of Thumb  8 cm         Assess circumference - see flow sheet- Pt finished chemo - radiation starting in the next week or 2  Pt to cont with  jovipak - and pt ed on wearing correctly and to wear as much as can to clear trunkle lymphedema to provided space for upper arm lymphedema to drain  Pt has loaner pump- per pt she is going to receive new pump  Will recommend one with trunkle vest - to clear chest and trunkle lymphedema  Pt ed on wearing of custom Job[employer] Elvarex soft sleeve and glove  - with wider band - that she got today  Glove fits great -  sleeve looked great -      Cont with daytime compression sleeve and glove - and  pump - to use 45-60 min in am and pm                OT Education - 06/16/18 1355    Education Details  use of compression sleeve and pump     Person(s) Educated  Patient    Methods  Explanation;Demonstration;Handout    Comprehension  Verbalized understanding;Returned demonstration          OT Long Term Goals - 06/16/18 1359      OT LONG TERM GOAL #1   Title  Pt to be independent in self MLD  and wearing correct compression to maintain lymphedema under control     Status  Achieved      OT LONG TERM GOAL #2   Title  Monitor lymphedema in L UE  for pt to maintain or change homeprogram  through out chemo to keep lymphedema under control     Baseline  need pump with chest or vest piece - await     Time  3    Period  Weeks    Status  On-going    Target Date  07/07/18            Plan - 06/16/18 1356    Clinical Impression Statement  Pt arrive with her new Jobst Elvarex soft sleev with wider band - much better fit and not  sliding down - or rolling - pt using temp pump and measurements - but still increase 3.5 cm in upper arm - pt to get her pump with chest or vest piece - because pt has trunkle lymphedema - and contribute to upper arm  still increase 3.5 cm - forearm still increase 2.1 cm - pt to contact me for follow up when she gets her permanent pump and used it for about 4-5 days     Occupational performance deficits (Please refer to evaluation for details):  IADL's;Work;Leisure;Play    Rehab Potential  Good    Current Impairments/barriers affecting progress:  chronic conditions     OT Frequency  Biweekly    OT Duration  8 weeks    OT Treatment/Interventions  Self-care/ADL training;Manual lymph drainage;Patient/family education;Therapeutic exercise    Plan  assess circumferencd and  use of custom garments and permanent pump with vest    Clinical Decision Making  Limited treatment options, no task modification necessary    OT Home Exercise Plan  see pt instruction     Consulted and Agree with Plan of Care  Patient       Patient will benefit from skilled therapeutic intervention in order to improve the following deficits and impairments:  Impaired flexibility, Increased edema, Impaired UE functional use, Decreased knowledge of precautions  Visit Diagnosis: Postmastectomy lymphedema syndrome - Plan: Ot plan of care cert/re-cert  Stiffness of right shoulder, not elsewhere classified - Plan: Ot plan of care cert/re-cert  Chronic right-sided low back pain with right-sided sciatica - Plan: Ot plan of care cert/re-cert    Problem List Patient Active Problem List   Diagnosis Date Noted  . Malignant neoplasm of upper-outer quadrant of right breast in female, estrogen receptor positive ([place]) 12/07/2017  . Allergic conjunctivitis of both eyes 09/16/2017  . Family history of breast cancer 10/13/2016  . Dysplasia of cervix, low grade (CIN 1) 10/13/2016  . Thrombocytosis ([place]) 03/16/2016  . Hemoglobin C trait  ([place]) 11/29/2015  . Hematuria 02/21/2015  .  Vitamin D deficiency 02/21/2015  . Sterilization consult 02/21/2015  . Low back pain   . Morbid obesity ([place])   . Hyperlipidemia   . Hypertension   . IFG (impaired fasting glucose)   . Migraines     Jaquavis Felmlee OTR/L,CLT 06/16/2018, 3:35 PM  [facility] [address], [place], [address] Phone: [phone]   Fax:  [phone]  Name: Tanai Floraine Buechler MRN: [id number] Date of Birth: 15-Mar-1986

## 2018-06-16 NOTE — Patient Instructions (Signed)
Use pump 2  X day 45-60 min  And Jobst elvarex soft sleeve and glove during day

## 2018-06-17 DIAGNOSIS — Z170 Estrogen receptor positive status [ER+]: Secondary | ICD-10-CM | POA: Diagnosis not present

## 2018-06-17 DIAGNOSIS — C50811 Malignant neoplasm of overlapping sites of right female breast: Secondary | ICD-10-CM | POA: Diagnosis not present

## 2018-06-20 ENCOUNTER — Other Ambulatory Visit: Payer: Self-pay | Admitting: Family Medicine

## 2018-06-21 ENCOUNTER — Other Ambulatory Visit: Payer: Self-pay | Admitting: Family Medicine

## 2018-06-22 ENCOUNTER — Encounter: Payer: Self-pay | Admitting: Family Medicine

## 2018-06-22 ENCOUNTER — Ambulatory Visit (INDEPENDENT_AMBULATORY_CARE_PROVIDER_SITE_OTHER): Payer: BLUE CROSS/BLUE SHIELD | Admitting: Family Medicine

## 2018-06-22 VITALS — BP 122/80 | HR 98 | Temp 98.5°F | Ht 65.0 in | Wt 305.0 lb

## 2018-06-22 DIAGNOSIS — E782 Mixed hyperlipidemia: Secondary | ICD-10-CM

## 2018-06-22 DIAGNOSIS — D473 Essential (hemorrhagic) thrombocythemia: Secondary | ICD-10-CM | POA: Diagnosis not present

## 2018-06-22 DIAGNOSIS — N870 Mild cervical dysplasia: Secondary | ICD-10-CM

## 2018-06-22 DIAGNOSIS — I10 Essential (primary) hypertension: Secondary | ICD-10-CM | POA: Diagnosis not present

## 2018-06-22 DIAGNOSIS — R7301 Impaired fasting glucose: Secondary | ICD-10-CM | POA: Diagnosis not present

## 2018-06-22 DIAGNOSIS — Z170 Estrogen receptor positive status [ER+]: Secondary | ICD-10-CM

## 2018-06-22 DIAGNOSIS — R635 Abnormal weight gain: Secondary | ICD-10-CM

## 2018-06-22 DIAGNOSIS — H1013 Acute atopic conjunctivitis, bilateral: Secondary | ICD-10-CM

## 2018-06-22 DIAGNOSIS — D75839 Thrombocytosis, unspecified: Secondary | ICD-10-CM

## 2018-06-22 DIAGNOSIS — C50411 Malignant neoplasm of upper-outer quadrant of right female breast: Secondary | ICD-10-CM

## 2018-06-22 DIAGNOSIS — E559 Vitamin D deficiency, unspecified: Secondary | ICD-10-CM

## 2018-06-22 NOTE — Assessment & Plan Note (Signed)
Managed by GYN at [facility]

## 2018-06-22 NOTE — Assessment & Plan Note (Signed)
Limit saturated fats; check lipids today 

## 2018-06-22 NOTE — Assessment & Plan Note (Signed)
Offered nutrition referral

## 2018-06-22 NOTE — Assessment & Plan Note (Signed)
Taking supplement.

## 2018-06-22 NOTE — Assessment & Plan Note (Signed)
Recent Cr and K+ already checked at [facility]; controlled BP today; try to follow DASH guidelines

## 2018-06-22 NOTE — Assessment & Plan Note (Signed)
Managed at [facility]

## 2018-06-22 NOTE — Patient Instructions (Addendum)
Check out the information at familydoctor.org entitled "Nutrition for Weight Loss: What You Need to Know about Fad Diets" Try to lose between 1-2 pounds per week by taking in fewer calories and burning off more calories You can succeed by limiting portions, limiting foods dense in calories and fat, becoming more active, and drinking 8 glasses of water a day (64 ounces) Don't skip meals, especially breakfast, as skipping meals may alter your metabolism Do not use over-the-counter weight loss pills or gimmicks that claim rapid weight loss A healthy BMI (or body mass index) is between 18.5 and 24.9 You can calculate your ideal BMI at the [place] website ClubMonetize.fr We'll have you see the allergist and the nutritionist in [place] We'll get labs today If you have not heard anything from my staff in a week about any orders/referrals/studies from today, please contact us here to follow-up [phone]  Obesity, Adult Obesity is the condition of having too much total body fat. Being overweight or obese means that your weight is greater than what is considered healthy for your body size. Obesity is determined by a measurement called BMI. BMI is an estimate of body fat and is calculated from height and weight. For adults, a BMI of 30 or higher is considered obese. Obesity can eventually lead to other health concerns and major illnesses, including:  Stroke.  Coronary artery disease (CAD).  Type 2 diabetes.  Some types of cancer, including cancers of the colon, breast, uterus, and gallbladder.  Osteoarthritis.  High blood pressure (hypertension).  High cholesterol.  Sleep apnea.  Gallbladder stones.  Infertility problems. What are the causes? The main cause of obesity is taking in (consuming) more calories than your body uses for energy. Other factors that contribute to this condition may include:  Being born with genes that make you more  likely to become obese.  Having a medical condition that causes obesity. These conditions include: ? Hypothyroidism. ? Polycystic ovarian syndrome (PCOS). ? Binge-eating disorder. ? Cushing syndrome.  Taking certain medicines, such as steroids, antidepressants, and seizure medicines.  Not being physically active (sedentary lifestyle).  Living where there are limited places to exercise safely or buy healthy foods.  Not getting enough sleep. What increases the risk? The following factors may increase your risk of this condition:  Having a family history of obesity.  Being a woman of African-American descent.  Being a man of Hispanic descent. What are the signs or symptoms? Having excessive body fat is the main symptom of this condition. How is this diagnosed? This condition may be diagnosed based on:  Your symptoms.  Your medical history.  A physical exam. Your health care provider may measure: ? Your BMI. If you are an adult with a BMI between 25 and less than 30, you are considered overweight. If you are an adult with a BMI of 30 or higher, you are considered obese. ? The distances around your hips and your waist (circumferences). These may be compared to each other to help diagnose your condition. ? Your skinfold thickness. Your health care provider may gently pinch a fold of your skin and measure it. How is this treated? Treatment for this condition often includes changing your lifestyle. Treatment may include some or all of the following:  Dietary changes. Work with your health care provider and a dietitian to set a weight-loss goal that is healthy and reasonable for you. Dietary changes may include eating: ? Smaller portions. A portion size is the amount of a particular  food that is healthy for you to eat at one time. This varies from person to person. ? Low-calorie or low-fat options. ? More whole grains, fruits, and vegetables.  Regular physical activity. This may  include aerobic activity (cardio) and strength training.  Medicine to help you lose weight. Your health care provider may prescribe medicine if you are unable to lose 1 pound a week after 6 weeks of eating more healthily and doing more physical activity.  Surgery. Surgical options may include gastric banding and gastric bypass. Surgery may be done if: ? Other treatments have not helped to improve your condition. ? You have a BMI of 40 or higher. ? You have life-threatening health problems related to obesity. Follow these instructions at home:  Eating and drinking   Follow recommendations from your health care provider about what you eat and drink. Your health care provider may advise you to: ? Limit fast foods, sweets, and processed snack foods. ? Choose low-fat options, such as low-fat milk instead of whole milk. ? Eat 5 or more servings of fruits or vegetables every day. ? Eat at home more often. This gives you more control over what you eat. ? Choose healthy foods when you eat out. ? Learn what a healthy portion size is. ? Keep low-fat snacks on hand. ? Avoid sugary drinks, such as soda, fruit juice, iced tea sweetened with sugar, and flavored milk. ? Eat a healthy breakfast.  Drink enough water to keep your urine clear or pale yellow.  Do not go without eating for long periods of time (do not fast) or follow a fad diet. Fasting and fad diets can be unhealthy and even dangerous. Physical Activity  Exercise regularly, as told by your health care provider. Ask your health care provider what types of exercise are safe for you and how often you should exercise.  Warm up and stretch before being active.  Cool down and stretch after being active.  Rest between periods of activity. Lifestyle  Limit the time that you spend in front of your TV, computer, or video game system.  Find ways to reward yourself that do not involve food.  Limit alcohol intake to no more than 1 drink a  day for nonpregnant women and 2 drinks a day for men. One drink equals 12 oz of beer, 5 oz of wine, or 1 oz of hard liquor. General instructions  Keep a weight loss journal to keep track of the food you eat and how much you exercise you get.  Take over-the-counter and prescription medicines only as told by your health care provider.  Take vitamins and supplements only as told by your health care provider.  Consider joining a support group. Your health care provider may be able to recommend a support group.  Keep all follow-up visits as told by your health care provider. This is important. Contact a health care provider if:  You are unable to meet your weight loss goal after 6 weeks of dietary and lifestyle changes. This information is not intended to replace advice given to you by your health care provider. Make sure you discuss any questions you have with your health care provider. Document Released: 06/04/2004 Document Revised: 09/30/2015 Document Reviewed: 02/13/2015 Elsevier Interactive Patient Education  2019 Elsevier Inc.  Preventing Unhealthy [employer], Adult Staying at a healthy weight is important to your overall health. When fat builds up in your body, you may become overweight or obese. Being overweight or obese increases your risk  of developing certain health problems, such as heart disease, diabetes, sleeping problems, joint problems, and some types of cancer. Unhealthy weight gain is often the result of making unhealthy food choices or not getting enough exercise. You can make changes to your lifestyle to prevent obesity and stay as healthy as possible. What nutrition changes can be made?   Eat only as much as your body needs. To do this: ? Pay attention to signs that you are hungry or full. Stop eating as soon as you feel full. ? If you feel hungry, try drinking water first before eating. Drink enough water so your urine is clear or pale yellow. ? Eat smaller portions.  Pay attention to portion sizes when eating out. ? Look at serving sizes on food labels. Most foods contain more than one serving per container. ? Eat the recommended number of calories for your gender and activity level. For most active people, a daily total of 2,000 calories is appropriate. If you are trying to lose weight or are not very active, you may need to eat fewer calories. Talk with your health care provider or a diet and nutrition specialist (dietitian) about how many calories you need each day.  Choose healthy foods, such as: ? Fruits and vegetables. At each meal, try to fill at least half of your plate with fruits and vegetables. ? Whole grains, such as whole-wheat bread, brown rice, and quinoa. ? Lean meats, such as chicken or fish. ? Other healthy proteins, such as beans, eggs, or tofu. ? Healthy fats, such as nuts, seeds, fatty fish, and olive oil. ? Low-fat or fat-free dairy products.  Check food labels, and avoid food and drinks that: ? Are high in calories. ? Have added sugar. ? Are high in sodium. ? Have saturated fats or trans fats.  Cook foods in healthier ways, such as by baking, broiling, or grilling.  Make a meal plan for the week, and shop with a grocery list to help you stay on track with your purchases. Try to avoid going to the grocery store when you are hungry.  When grocery shopping, try to shop around the outside of the store first, where the fresh foods are. Doing this helps you to avoid prepackaged foods, which can be high in sugar, salt (sodium), and fat. What lifestyle changes can be made?   Exercise for 30 or more minutes on 5 or more days each week. Exercising may include brisk walking, yard work, biking, running, swimming, and team sports like basketball and soccer. Ask your health care provider which exercises are safe for you.  Do muscle-strengthening activities, such as lifting weights or using resistance bands, on 2 or more days a week.  Do not  use any products that contain nicotine or tobacco, such as cigarettes and e-cigarettes. If you need help quitting, ask your health care provider.  Limit alcohol intake to no more than 1 drink a day for nonpregnant women and 2 drinks a day for men. One drink equals 12 oz of beer, 5 oz of wine, or 1 oz of hard liquor.  Try to get 7-9 hours of sleep each night. What other changes can be made?  Keep a food and activity journal to keep track of: ? What you ate and how many calories you had. Remember to count the calories in sauces, dressings, and side dishes. ? Whether you were active, and what exercises you did. ? Your calorie, weight, and activity goals.  Check your weight regularly.  Track any changes. If you notice you have gained weight, make changes to your diet or activity routine.  Avoid taking weight-loss medicines or supplements. Talk to your health care provider before starting any new medicine or supplement.  Talk to your health care provider before trying any new diet or exercise plan. Why are these changes important? Eating healthy, staying active, and having healthy habits can help you to prevent obesity. Those changes also:  Help you manage stress and emotions.  Help you connect with friends and family.  Improve your self-esteem.  Improve your sleep.  Prevent long-term health problems. What can happen if changes are not made? Being obese or overweight can cause you to develop joint or bone problems, which can make it hard for you to stay active or do activities you enjoy. Being obese or overweight also puts stress on your heart and lungs and can lead to health problems like diabetes, heart disease, and some cancers. Where to find more information Talk with your health care provider or a dietitian about healthy eating and healthy lifestyle choices. You may also find information from:  U.S. Department of Agriculture, MyPlate: FormerBoss.no  American Heart  Association: www.heart.org  Centers for Disease Control and Prevention: http://www.wolf.info/ Summary  Staying at a healthy weight is important to your overall health. It helps you to prevent certain diseases and health problems, such as heart disease, diabetes, joint problems, sleep disorders, and some types of cancer.  Being obese or overweight can cause you to develop joint or bone problems, which can make it hard for you to stay active or do activities you enjoy.  You can prevent unhealthy weight gain by eating a healthy diet, exercising regularly, not smoking, limiting alcohol, and getting enough sleep.  Talk with your health care provider or a dietitian for guidance about healthy eating and healthy lifestyle choices. This information is not intended to replace advice given to you by your health care provider. Make sure you discuss any questions you have with your health care provider. Document Released: 04/28/2016 Document Revised: 02/05/2017 Document Reviewed: 06/03/2016 Elsevier Interactive Patient Education  2019 [employer].

## 2018-06-22 NOTE — Assessment & Plan Note (Signed)
Offered testing; refer to allergist

## 2018-06-22 NOTE — Progress Notes (Signed)
BP 122/80   Pulse 98   Temp 98.5 F (36.9 C)   Ht _0  (1.651 m)   Wt (!) 305 lb (138.3 kg)   LMP 02/08/2018   SpO2 98%   BMI 50.75 kg/m    Subjective:    Patient ID: Mckenzie Lopez, female    DOB: 1986/05/08, 33 y.o.   MRN: [id number]  HPI: Mckenzie Lopez is a 33 y.o. female  Chief Complaint  Patient presents with  . Follow-up    HPI   HTN; controlled today; no problems with the medicines; checks BP away from here; 120s and 130s, just once in a while in the 140s  Breast cancer; runs in the family (aunt and grandmother maternal); genetic testing negative S/p mastectomy; no problems after surgery Starts radiation in 2 weeks; daily for 5 weeks; goes for marking  She has gained a lot of weight; not eating any more than before; she used to be active; not at work; used to be on steroids during chemo Mild anemia noted in [place]; [facility] is just watching; no extra irone CMP reviewed, just done May 31, 2018  Prediabetes; no dry mouth; mother had grandfather had diabetes; trying to avoid sugary drinks; mostly drinking water  High cholesterol; not a steak person; more chicken than red meat; more fish; hamburger and gravy just once in a while; likes fruits, cabbages and turnip greens and green beans  Allergies; uses flonase during allergy season; not sure what she is allergic to, pollen tires her up; mowing grass gets her going, eyes itching and sneezing really bad  Vitamin D deficiency; energy level is good; not tired; taking vitamin D now  Pap smears through [facility]  Depression screen [facility] 2/9 06/22/2018 12/20/2017 09/16/2017 07/28/2017 01/28/2017  Decreased Interest 0 0 0 0 0  Down, Depressed, Hopeless 0 0 0 0 0  PHQ - 2 Score 0 0 0 0 0  Altered sleeping 0 - - - -  Tired, decreased energy 0 - - - -  Change in appetite 0 - - - -  Feeling bad or failure about yourself  0 - - - -  Trouble concentrating 0 - - - -  Moving slowly or fidgety/restless 0 - -  - -  Suicidal thoughts 0 - - - -  PHQ-9 Score 0 - - - -  Difficult doing work/chores Not difficult at all - - - -   Fall Risk  06/22/2018 12/20/2017 09/16/2017 07/28/2017 01/28/2017  Falls in the past year? 0 No No No No    Relevant past medical, surgical, family and social history reviewed Past Medical History:  Diagnosis Date  . Cancer ([place]) 07/252019   breast  . CHF (congestive heart failure) ([place])    POSTPARTUM 2017/ RESOLVED  . Eczema   . 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 Hyperlipidemia   . Hypertension   . IFG (impaired fasting glucose)   . Low back pain   . Migraines   . Morbid obesity ([place])    Past Surgical History:  Procedure Laterality Date  . AXILLARY LYMPH NODE BIOPSY Right 12/01/2017   METASTATIC MAMMARY CARCINOMA  . AXILLARY LYMPH NODE BIOPSY Left 12/01/2017   NEGATIVE FOR MALIGNANCY  . BREAST BIOPSY Right 12/01/2017   11 and 12 o'clock, INVASIVE MAMMARY CARCINOMA, ER/PR positive HER2 negative  . MASTECTOMY Right 01/05/2018   Family History  Problem Relation Age of Onset  . Diabetes Mother   . Hypertension Mother   .  Thyroid disease Mother   . Breast cancer Maternal Grandmother        ?age  . Diabetes Maternal Grandfather   . Hypertension Maternal Grandfather   . Seizures Maternal Grandfather   . Breast cancer Maternal Aunt        early 52's  . Diabetes Sister   . Diabetes Paternal Grandmother   . Heart disease Neg Hx   . Stroke Neg Hx   . COPD Neg Hx   . Ovarian cancer Neg Hx    Social History   Tobacco Use  . Smoking status: Never Smoker  . Smokeless tobacco: Never Used  Substance Use Topics  . Alcohol use: No  . Drug use: No     Office Visit from 06/22/2018 in [facility]  AUDIT-C Score  0      Interim medical history since last visit reviewed. Allergies and medications reviewed  Review of Systems Per HPI unless specifically indicated above     Objective:    BP 122/80   Pulse 98   Temp 98.5 F (36.9 C)    Ht _0  (1.651 m)   Wt (!) 305 lb (138.3 kg)   LMP 02/08/2018   SpO2 98%   BMI 50.75 kg/m   Wt Readings from Last 3 Encounters:  06/22/18 (!) 305 lb (138.3 kg)  12/20/17 264 lb 14.4 oz (120.2 kg)  12/07/17 269 lb (122 kg)    Physical Exam Constitutional:      General: She is not in acute distress.    Appearance: She is well-developed. She is obese. She is not diaphoretic.  HENT:     Head: Normocephalic and atraumatic.  Eyes:     General: No scleral icterus. Neck:     Thyroid: No thyromegaly.  Cardiovascular:     Rate and Rhythm: Normal rate and regular rhythm.     Heart sounds: Normal heart sounds. No murmur.  Pulmonary:     Effort: Pulmonary effort is normal. No respiratory distress.     Breath sounds: Normal breath sounds. No wheezing.  Abdominal:     General: Bowel sounds are normal. There is no distension.     Palpations: Abdomen is soft.  Skin:    General: Skin is warm and dry.     Coloration: Skin is not pale.  Neurological:     Mental Status: She is alert.  Psychiatric:        Behavior: Behavior normal.        Thought Content: Thought content normal.        Judgment: Judgment normal.     Results for orders placed or performed in visit on 12/20/17  Lipid panel  Result Value Ref Range   Cholesterol 185 <200 mg/dL   HDL 49 (L) >50 mg/dL   Triglycerides 58 <150 mg/dL   LDL Cholesterol (Calc) 122 (H) mg/dL (calc)   Total CHOL/HDL Ratio 3.8 <5.0 (calc)   Non-HDL Cholesterol (Calc) 136 (H) <130 mg/dL (calc)  Hemoglobin A1c  Result Value Ref Range   Hgb A1c MFr Bld 4.8 <5.7 % of total Hgb   Mean Plasma Glucose 91 (calc)   eAG (mmol/L) 5.0 (calc)  COMPLETE METABOLIC PANEL WITH GFR  Result Value Ref Range   Glucose, Bld 80 65 - 99 mg/dL   BUN 10 7 - 25 mg/dL   Creat 0.81 0.50 - 1.10 mg/dL   GFR, Est Non African American 95 > OR = 60 mL/min/1.63m   GFR, Est African American 110 >  OR = 60 mL/min/1.63m   BUN/Creatinine Ratio NOT APPLICABLE 6 - 22 (calc)     Sodium 138 135 - 146 mmol/L   Potassium 3.8 3.5 - 5.3 mmol/L   Chloride 106 98 - 110 mmol/L   CO2 24 20 - 32 mmol/L   Calcium 9.2 8.6 - 10.2 mg/dL   Total Protein 6.9 6.1 - 8.1 g/dL   Albumin 4.4 3.6 - 5.1 g/dL   Globulin 2.5 1.9 - 3.7 g/dL (calc)   AG Ratio 1.8 1.0 - 2.5 (calc)   Total Bilirubin 0.3 0.2 - 1.2 mg/dL   Alkaline phosphatase (APISO) 43 33 - 115 U/L   AST 10 10 - 30 U/L   ALT 9 6 - 29 U/L  CBC with Differential/Platelet  Result Value Ref Range   WBC 4.6 3.8 - 10.8 Thousand/uL   RBC 4.37 3.80 - 5.10 Million/uL   Hemoglobin 12.2 11.7 - 15.5 g/dL   HCT 35.0 35.0 - 45.0 %   MCV 80.1 80.0 - 100.0 fL   MCH 27.9 27.0 - 33.0 pg   MCHC 34.9 32.0 - 36.0 g/dL   RDW 14.2 11.0 - 15.0 %   Platelets 536 (H) 140 - 400 Thousand/uL   MPV 8.5 7.5 - 12.5 fL   Neutro Abs 2,673 1,500 - 7,800 cells/uL   Lymphs Abs 1,467 850 - 3,900 cells/uL   WBC mixed population 290 200 - 950 cells/uL   Eosinophils Absolute 110 15 - 500 cells/uL   Basophils Absolute 60 0 - 200 cells/uL   Neutrophils Relative % 58.1 %   Total Lymphocyte 31.9 %   Monocytes Relative 6.3 %   Eosinophils Relative 2.4 %   Basophils Relative 1.3 %  VITAMIN D 25 Hydroxy (Vit-D Deficiency, Fractures)  Result Value Ref Range   Vit D, 25-Hydroxy 27 (L) 30 - 100 ng/mL      Assessment & Plan:   Problem List Items Addressed This Visit      Cardiovascular and Mediastinum   Hypertension (Chronic)    [facility]; try to follow DASH guidelines        Endocrine   IFG (impaired fasting glucose) (Chronic)    Asymptomatic, but check A1c      Relevant Orders   Hemoglobin A1c     Genitourinary   Dysplasia of cervix, low grade (CIN 1)    [facility]       Hematopoietic and Hemostatic   Thrombocytosis (HCC) (Chronic)    Noted in labs from [facility]        Other   Vitamin D deficiency    Taking supplement      Morbid obesity (HCC) (Chronic)     Offered nutrition referral      Relevant Orders   Amb ref to Medical Nutrition Therapy-MNT   Malignant neoplasm of upper-outer quadrant of right breast in female, estrogen receptor positive ([place]    [facility]     Relevant Medications   letrozole (FQu[place]zsite 2.5 MG tablet   Hyperlipidemia    Limit saturated fats; check lipids today      Relevant Orders   Lipid panel   Allergic conjunctivitis of both eyes    Offered testing; refer to allergist      Relevant Orders   Ambulatory referral to Allergy    Other Visit Diagnoses    Weight gain    -  Primary   Relevant Orders  T4, free   TSH       Follow up plan: Return in about 3 months (around 09/20/2018).  An after-visit summary was printed and given to the patient at [place].  Please see the patient instructions which may contain other information and recommendations beyond what is mentioned above in the assessment and plan.  No orders of the defined types were placed in this encounter.   Orders Placed This Encounter  Procedures  . T4, free  . TSH  . Lipid panel  . Hemoglobin A1c  . Ambulatory referral to Allergy  . Amb ref to Medical Nutrition Therapy-MNT

## 2018-06-22 NOTE — Assessment & Plan Note (Signed)
Asymptomatic, but check A1c

## 2018-06-22 NOTE — Assessment & Plan Note (Signed)
Noted in labs from [facility]

## 2018-06-23 LAB — LIPID PANEL
Cholesterol: 185 mg/dL (ref <200)
HDL: 42 mg/dL — ABNORMAL LOW (ref >=50)
LDL Cholesterol (Calc): 121 mg/dL — ABNORMAL HIGH
Non-HDL Cholesterol (Calc): 143 mg/dL — ABNORMAL HIGH (ref <130)
Total CHOL/HDL Ratio: 4.4 (calc) (ref <5.0)
Triglycerides: 113 mg/dL (ref <150)

## 2018-06-23 LAB — HEMOGLOBIN A1C
Hgb A1c MFr Bld: 5 %{Hb} (ref <5.7)
Mean Plasma Glucose: 97 (calc)
eAG (mmol/L): 5.4 (calc)

## 2018-06-23 LAB — T4, FREE: Free T4: 1.2 ng/dL (ref 0.8–1.8)

## 2018-06-23 LAB — TSH: TSH: 1.8 m[IU]/L

## 2018-06-24 DIAGNOSIS — C50811 Malignant neoplasm of overlapping sites of right female breast: Secondary | ICD-10-CM | POA: Diagnosis not present

## 2018-06-24 DIAGNOSIS — Z170 Estrogen receptor positive status [ER+]: Secondary | ICD-10-CM | POA: Diagnosis not present

## 2018-06-24 DIAGNOSIS — C50911 Malignant neoplasm of unspecified site of right female breast: Secondary | ICD-10-CM | POA: Diagnosis not present

## 2018-07-05 DIAGNOSIS — C50811 Malignant neoplasm of overlapping sites of right female breast: Secondary | ICD-10-CM | POA: Diagnosis not present

## 2018-07-06 DIAGNOSIS — C50811 Malignant neoplasm of overlapping sites of right female breast: Secondary | ICD-10-CM | POA: Diagnosis not present

## 2018-07-07 DIAGNOSIS — C50811 Malignant neoplasm of overlapping sites of right female breast: Secondary | ICD-10-CM | POA: Diagnosis not present

## 2018-07-11 DIAGNOSIS — Z9221 Personal history of antineoplastic chemotherapy: Secondary | ICD-10-CM | POA: Diagnosis not present

## 2018-07-11 DIAGNOSIS — E559 Vitamin D deficiency, unspecified: Secondary | ICD-10-CM | POA: Diagnosis not present

## 2018-07-11 DIAGNOSIS — C773 Secondary and unspecified malignant neoplasm of axilla and upper limb lymph nodes: Secondary | ICD-10-CM | POA: Diagnosis not present

## 2018-07-11 DIAGNOSIS — Z510 Encounter for antineoplastic radiation therapy: Secondary | ICD-10-CM | POA: Diagnosis not present

## 2018-07-11 DIAGNOSIS — Z9189 Other specified personal risk factors, not elsewhere classified: Secondary | ICD-10-CM | POA: Diagnosis not present

## 2018-07-11 DIAGNOSIS — Z170 Estrogen receptor positive status [ER+]: Secondary | ICD-10-CM | POA: Diagnosis not present

## 2018-07-11 DIAGNOSIS — C50811 Malignant neoplasm of overlapping sites of right female breast: Secondary | ICD-10-CM | POA: Diagnosis not present

## 2018-07-11 DIAGNOSIS — G629 Polyneuropathy, unspecified: Secondary | ICD-10-CM | POA: Diagnosis not present

## 2018-07-11 DIAGNOSIS — Z9011 Acquired absence of right breast and nipple: Secondary | ICD-10-CM | POA: Diagnosis not present

## 2018-07-11 DIAGNOSIS — D649 Anemia, unspecified: Secondary | ICD-10-CM | POA: Diagnosis not present

## 2018-07-11 DIAGNOSIS — L03113 Cellulitis of right upper limb: Secondary | ICD-10-CM | POA: Diagnosis not present

## 2018-07-11 DIAGNOSIS — I890 Lymphedema, not elsewhere classified: Secondary | ICD-10-CM | POA: Diagnosis not present

## 2018-07-11 DIAGNOSIS — L03115 Cellulitis of right lower limb: Secondary | ICD-10-CM | POA: Diagnosis not present

## 2018-07-12 DIAGNOSIS — C50811 Malignant neoplasm of overlapping sites of right female breast: Secondary | ICD-10-CM | POA: Diagnosis not present

## 2018-07-13 DIAGNOSIS — C50811 Malignant neoplasm of overlapping sites of right female breast: Secondary | ICD-10-CM | POA: Diagnosis not present

## 2018-07-14 DIAGNOSIS — C50811 Malignant neoplasm of overlapping sites of right female breast: Secondary | ICD-10-CM | POA: Diagnosis not present

## 2018-07-15 DIAGNOSIS — C50811 Malignant neoplasm of overlapping sites of right female breast: Secondary | ICD-10-CM | POA: Diagnosis not present

## 2018-07-18 DIAGNOSIS — C50811 Malignant neoplasm of overlapping sites of right female breast: Secondary | ICD-10-CM | POA: Diagnosis not present

## 2018-07-19 DIAGNOSIS — C50811 Malignant neoplasm of overlapping sites of right female breast: Secondary | ICD-10-CM | POA: Diagnosis not present

## 2018-07-20 DIAGNOSIS — C50811 Malignant neoplasm of overlapping sites of right female breast: Secondary | ICD-10-CM | POA: Diagnosis not present

## 2018-07-21 DIAGNOSIS — C50811 Malignant neoplasm of overlapping sites of right female breast: Secondary | ICD-10-CM | POA: Diagnosis not present

## 2018-07-22 DIAGNOSIS — C50811 Malignant neoplasm of overlapping sites of right female breast: Secondary | ICD-10-CM | POA: Diagnosis not present

## 2018-07-25 DIAGNOSIS — C50811 Malignant neoplasm of overlapping sites of right female breast: Secondary | ICD-10-CM | POA: Diagnosis not present

## 2018-07-26 DIAGNOSIS — C50811 Malignant neoplasm of overlapping sites of right female breast: Secondary | ICD-10-CM | POA: Diagnosis not present

## 2018-07-27 DIAGNOSIS — C50811 Malignant neoplasm of overlapping sites of right female breast: Secondary | ICD-10-CM | POA: Diagnosis not present

## 2018-07-28 DIAGNOSIS — C50811 Malignant neoplasm of overlapping sites of right female breast: Secondary | ICD-10-CM | POA: Diagnosis not present

## 2018-07-29 DIAGNOSIS — C50811 Malignant neoplasm of overlapping sites of right female breast: Secondary | ICD-10-CM | POA: Diagnosis not present

## 2018-08-01 DIAGNOSIS — C50811 Malignant neoplasm of overlapping sites of right female breast: Secondary | ICD-10-CM | POA: Diagnosis not present

## 2018-08-02 DIAGNOSIS — C50811 Malignant neoplasm of overlapping sites of right female breast: Secondary | ICD-10-CM | POA: Diagnosis not present

## 2018-08-03 DIAGNOSIS — C50811 Malignant neoplasm of overlapping sites of right female breast: Secondary | ICD-10-CM | POA: Diagnosis not present

## 2018-08-04 DIAGNOSIS — C50811 Malignant neoplasm of overlapping sites of right female breast: Secondary | ICD-10-CM | POA: Diagnosis not present

## 2018-08-05 DIAGNOSIS — Z510 Encounter for antineoplastic radiation therapy: Secondary | ICD-10-CM | POA: Diagnosis not present

## 2018-08-05 DIAGNOSIS — C50811 Malignant neoplasm of overlapping sites of right female breast: Secondary | ICD-10-CM | POA: Diagnosis not present

## 2018-08-08 DIAGNOSIS — C50811 Malignant neoplasm of overlapping sites of right female breast: Secondary | ICD-10-CM | POA: Diagnosis not present

## 2018-08-08 DIAGNOSIS — Z170 Estrogen receptor positive status [ER+]: Secondary | ICD-10-CM | POA: Diagnosis not present

## 2018-08-09 DIAGNOSIS — C50811 Malignant neoplasm of overlapping sites of right female breast: Secondary | ICD-10-CM | POA: Diagnosis not present

## 2018-08-10 DIAGNOSIS — C50811 Malignant neoplasm of overlapping sites of right female breast: Secondary | ICD-10-CM | POA: Diagnosis not present

## 2018-08-11 DIAGNOSIS — C50811 Malignant neoplasm of overlapping sites of right female breast: Secondary | ICD-10-CM | POA: Diagnosis not present

## 2018-08-12 DIAGNOSIS — C50811 Malignant neoplasm of overlapping sites of right female breast: Secondary | ICD-10-CM | POA: Diagnosis not present

## 2018-08-15 DIAGNOSIS — C50811 Malignant neoplasm of overlapping sites of right female breast: Secondary | ICD-10-CM | POA: Diagnosis not present

## 2018-08-16 DIAGNOSIS — C50811 Malignant neoplasm of overlapping sites of right female breast: Secondary | ICD-10-CM | POA: Diagnosis not present

## 2018-08-23 DIAGNOSIS — I972 Postmastectomy lymphedema syndrome: Secondary | ICD-10-CM | POA: Diagnosis not present

## 2018-09-05 DIAGNOSIS — C773 Secondary and unspecified malignant neoplasm of axilla and upper limb lymph nodes: Secondary | ICD-10-CM | POA: Diagnosis not present

## 2018-09-05 DIAGNOSIS — G629 Polyneuropathy, unspecified: Secondary | ICD-10-CM | POA: Diagnosis not present

## 2018-09-05 DIAGNOSIS — D649 Anemia, unspecified: Secondary | ICD-10-CM | POA: Diagnosis not present

## 2018-09-05 DIAGNOSIS — Z9011 Acquired absence of right breast and nipple: Secondary | ICD-10-CM | POA: Diagnosis not present

## 2018-09-05 DIAGNOSIS — I890 Lymphedema, not elsewhere classified: Secondary | ICD-10-CM | POA: Diagnosis not present

## 2018-09-05 DIAGNOSIS — Z170 Estrogen receptor positive status [ER+]: Secondary | ICD-10-CM | POA: Diagnosis not present

## 2018-09-05 DIAGNOSIS — C50811 Malignant neoplasm of overlapping sites of right female breast: Secondary | ICD-10-CM | POA: Diagnosis not present

## 2018-09-05 DIAGNOSIS — E559 Vitamin D deficiency, unspecified: Secondary | ICD-10-CM | POA: Diagnosis not present

## 2018-09-08 ENCOUNTER — Ambulatory Visit: Payer: Medicaid Other | Attending: Physician Assistant | Admitting: Occupational Therapy

## 2018-09-08 ENCOUNTER — Other Ambulatory Visit: Payer: Self-pay

## 2018-09-08 DIAGNOSIS — I972 Postmastectomy lymphedema syndrome: Secondary | ICD-10-CM | POA: Diagnosis not present

## 2018-09-08 NOTE — Therapy (Signed)
[facility] [address], [place], [address] Phone: [phone]   Fax:  [phone]  Occupational Therapy Treatment  Patient Details  Name: Tabbetha Julieana Lopez MRN: [id number] Date of Birth: 1985/07/11 Referring Provider (OT): Garlan Fair   Encounter Date: 09/08/2018  OT End of Session - 09/08/18 1548    Visit Number  12    Number of Visits  14    Date for OT Re-Evaluation  10/20/18    OT Start Time  1000    OT Stop Time  1058    OT Time Calculation (min)  58 min    Activity Tolerance  Patient tolerated treatment well    Behavior During Therapy  [facility] for tasks assessed/performed       Past Medical History:  Diagnosis Date  . Cancer ([place]) 07/252019   breast  . CHF (congestive heart failure) ([place])    POSTPARTUM 2017/ RESOLVED  . Eczema   . Hemoglobin C trait ([place]) 11/29/2015  . Hyperlipidemia   . Hypertension   . IFG (impaired fasting glucose)   . Low back pain   . Migraines   . Morbid obesity ([place])     Past Surgical History:  Procedure Laterality Date  . AXILLARY LYMPH NODE BIOPSY Right 12/01/2017   METASTATIC MAMMARY CARCINOMA  . AXILLARY LYMPH NODE BIOPSY Left 12/01/2017   NEGATIVE FOR MALIGNANCY  . BREAST BIOPSY Right 12/01/2017   11 and 12 o'clock, INVASIVE MAMMARY CARCINOMA, ER/PR positive HER2 negative  . MASTECTOMY Right 01/05/2018    There were no vitals filed for this visit.  Subjective Assessment - 09/08/18 1543    Subjective   ( finished my radiation , got my real pump - open areas is healing on my breast and under my arm - wearing my sleeve but do not put it on after my am pump - I put on my arm feels like it is swelling -  i have still neuropathy in my feet and fingers     Patient Stated Goals  I want to keep the lympedema under control     Currently in Pain?  No/denies          LYMPHEDEMA/ONCOLOGY QUESTIONNAIRE - 09/08/18 1027      Right Upper Extremity  Lymphedema   15 cm Proximal to Olecranon Process  49.5 cm    10 cm Proximal to Olecranon Process  44.8 cm    Olecranon Process  34 cm    15 cm Proximal to Ulnar Styloid Process  32.5 cm    10 cm Proximal to Ulnar Styloid Process  28.5 cm    Just Proximal to Ulnar Styloid Process  21.6 cm    Across Hand at [employer]  23 cm    At [place] of 2nd Digit  8 cm    At [facility] of Thumb  8 cm      Left Upper Extremity Lymphedema   15 cm Proximal to Olecranon Process  45.5 cm    10 cm Proximal to Olecranon Process  42 cm    Olecranon Process  31.5 cm    15 cm Proximal to Ulnar Styloid Process  30 cm    10 cm Proximal to Ulnar Styloid Process  26 cm    Just Proximal to Ulnar Styloid Process  20.5 cm    Across Hand at [employer]  20.5 cm    At [place] of 2nd Digit  7 cm  At [facility] of Thumb  7.5 cm      measurement taken - R UE did decrease in upper arm, forearm and hand - but still increase compare to L UE - bra rolling on R side - pt in need for correct bras to provide correct compression  Just started wearing bra or jovipak because of radiation burns  Did not use her new pump with trunkle vest until this week    Fibrosis still present in forearm and distal upper arm - will assess if pump in 3 wks will decrease that      Pt to cont with daytime compression sleeve and glove but to  put on in less than hour after am pump  Cont with pm pump too Provided her for night time new  tubigrip soft for hand to elbow , and size E for wrist to upper arm  With isotoner glove  Make sure her bra has wide strap under arm - for compression - and can start using jovipak breast pad again             OT Education - 09/08/18 1548    Education Details  homeprogram    Person(s) Educated  Patient    Methods  Explanation;Demonstration;Handout    Comprehension  Verbalized understanding;Returned demonstration          OT Long Term Goals - 09/08/18 1554      OT LONG TERM GOAL #1   Title  Pt to  be independent in self MLD  and wearing correct compression to maintain lymphedema under control     Status  Achieved      OT LONG TERM GOAL #2   Title  Monitor lymphedema in R  UE  for pt to decrease /maintain circumference with use of new pump and daytime compression     Baseline  just started using pump this week - had radiation burns - jovipak she was uanble to use yet     Time  6    Period  Weeks    Status  New    Target Date  10/20/18      OT LONG TERM GOAL #3   Title  Fibrosis in L foream and upper arm decrease with use of pump and daytime compression to prevent cellulitis     Baseline  Fribrosis in foream and distal upper arm - due to long standing lymphedema and not having correct pump     Time  6    Period  Weeks    Status  New    Target Date  10/20/18            Plan - 09/08/18 1549    Clinical Impression Statement  Pt arrive with no compression sleeve on - her radiation she is finished with since last time seen - did get her pump and chest vest for trunkle lymphedema  and showed decrease in measurement in R UE since last time- but stilll increase compare to L by 4 cm in upper arm , 3.5 cm at elbow , 2.3 in forearm , and 3 cm in hand - reinforce pt to donn compression sleeve for daytime in less than hour after AM pump use - and provided her new tubigrip for night time compression - pt just started pump because of radiation burns -pt to follow up in 3 wks - and contact DME company about correct bra for compression for chest /trunkle lymphedema - her bra rolls and  are narrow  under arm     Occupational performance deficits (Please refer to evaluation for details):  IADL's;Work;Leisure;Play    Body Structure / Function / Physical Skills  ADL;Scar mobility;Edema;Sensation;UE functional use    Rehab Potential  Good    Clinical Decision Making  Limited treatment options, no task modification necessary    Modification or Assistance to Complete Evaluation   Min-Moderate  modification of tasks or assist with assess necessary to complete eval    OT Frequency  --   every 3 wks   OT Duration  6 weeks    OT Treatment/Interventions  Self-care/ADL training;Manual lymph drainage;Patient/family education;Therapeutic exercise;Scar mobilization;DME and/or AE instruction    Plan  assess circumferencd and  use of custom garments and permanent pump with vest    OT Home Exercise Plan  see pt instruction     Consulted and Agree with Plan of Care  Patient       Patient will benefit from skilled therapeutic intervention in order to improve the following deficits and impairments:  Body Structure / Function / Physical Skills  Visit Diagnosis: Postmastectomy lymphedema syndrome - Plan: Ot plan of care cert/re-cert    Problem List Patient Active Problem List   Diagnosis Date Noted  . Malignant neoplasm of upper-outer quadrant of right breast in female, estrogen receptor positive ([place]) 12/07/2017  . Allergic conjunctivitis of both eyes 09/16/2017  . Family history of breast cancer 10/13/2016  . Dysplasia of cervix, low grade (CIN 1) 10/13/2016  . Thrombocytosis ([place]) 03/16/2016  . Hemoglobin C trait ([place]) 11/29/2015  . Hematuria 02/21/2015  . Vitamin D deficiency 02/21/2015  . Sterilization consult 02/21/2015  . Low back pain   . Morbid obesity ([place])   . Hyperlipidemia   . Hypertension   . IFG (impaired fasting glucose)   . Migraines     Borna Wessinger OTR/L,CLT 09/08/2018, 4:03 PM  [facility] [address] [place], [address] Phone: [phone]   Fax:  [phone]  Name: [facility] MRN: [id number] Date of Birth: [birth year]

## 2018-09-08 NOTE — Patient Instructions (Signed)
Pt to cont with daytime compression sleeve and glove but put on in less than hour after am pump  Cont with pm pump too Provided her for night time new  tubigrip soft for hand to elbow , and size E for wrist to upper arm  With isotoner glove  Make sure her bra has wide strap under arm - for compression -

## 2018-09-20 ENCOUNTER — Ambulatory Visit (INDEPENDENT_AMBULATORY_CARE_PROVIDER_SITE_OTHER): Payer: Medicaid Other | Admitting: Nurse Practitioner

## 2018-09-20 ENCOUNTER — Encounter: Payer: Self-pay | Admitting: Nurse Practitioner

## 2018-09-20 ENCOUNTER — Other Ambulatory Visit: Payer: Self-pay

## 2018-09-20 VITALS — BP 133/83 | HR 97 | Temp 97.4°F | Ht 65.0 in | Wt 288.0 lb

## 2018-09-20 DIAGNOSIS — E7849 Other hyperlipidemia: Secondary | ICD-10-CM

## 2018-09-20 DIAGNOSIS — Z170 Estrogen receptor positive status [ER+]: Secondary | ICD-10-CM | POA: Diagnosis not present

## 2018-09-20 DIAGNOSIS — I10 Essential (primary) hypertension: Secondary | ICD-10-CM | POA: Diagnosis not present

## 2018-09-20 DIAGNOSIS — C50411 Malignant neoplasm of upper-outer quadrant of right female breast: Secondary | ICD-10-CM

## 2018-09-20 DIAGNOSIS — K219 Gastro-esophageal reflux disease without esophagitis: Secondary | ICD-10-CM | POA: Diagnosis not present

## 2018-09-20 NOTE — Progress Notes (Signed)
Virtual Visit via Video Note  I connected with Mckenzie Lopez on 09/20/18 at  8:40 AM EDT by a video enabled telemedicine application and verified that I am speaking with the correct person using two identifiers.   Staff discussed the limitations of evaluation and management by telemedicine and the availability of in person appointments. The patient expressed understanding and agreed to proceed.  Patient location: home  My location: work office Other people present:  none HPI  Hypertension rx amlodipine '5mg'$  daily, and hydralazine '50mg'$  TID and labetalol '200mg'$  TID.  States she has been on this regimen since she had her daughter due to hypertensive crisis. Her blood pressure has been well controlled on this regimen and she has no issues with this.  BP Readings from Last 3 Encounters:  09/20/18 133/83  06/22/18 122/80  12/20/17 126/84   GERD Takes famotidine at night time, takes it as needed maybe every other day at the maximum.   Hyperlipidemia Not presently on any medications for cholesterol.  Lab Results  Component Value Date   CHOL 185 06/22/2018   HDL 42 (L) 06/22/2018   LDLCALC 121 (H) 06/22/2018   TRIG 113 06/22/2018   CHOLHDL 4.4 06/22/2018   Breast Cancer- right breast Has completed radiation and chemotherapy, has a follow-up in a few weeks. States last scans did not show metatstasis. She is presently taking femara and pentoxyfylline.   Morbid obesity  Had tried saxenda for 4 months with some improvement. States she was getting a lot of steroids when doing chemo and gained weight.   Lab Results  Component Value Date   HGBA1C 5.0 06/22/2018       PHQ2/9: Depression screen [facility] 2/9 09/20/2018 06/22/2018 12/20/2017 09/16/2017 07/28/2017  Decreased Interest 0 0 0 0 0  Down, Depressed, Hopeless 0 0 0 0 0  PHQ - 2 Score 0 0 0 0 0  Altered sleeping 0 0 - - -  Tired, decreased energy 0 0 - - -  Change in appetite 0 0 - - -  Feeling bad or failure about yourself  0 0 - - -   Trouble concentrating 0 0 - - -  Moving slowly or fidgety/restless 0 0 - - -  Suicidal thoughts 0 0 - - -  PHQ-9 Score 0 0 - - -  Difficult doing work/chores Not difficult at all Not difficult at all - - -    PHQ reviewed. Negative  Patient Active Problem List   Diagnosis Date Noted  . Malignant neoplasm of upper-outer quadrant of right breast in female, estrogen receptor positive ([place]) 12/07/2017  . Allergic conjunctivitis of both eyes 09/16/2017  . Family history of breast cancer 10/13/2016  . Dysplasia of cervix, low grade (CIN 1) 10/13/2016  . Thrombocytosis ([place]) 03/16/2016  . Hemoglobin C trait ([place]) 11/29/2015  . Hematuria 02/21/2015  . Vitamin D deficiency 02/21/2015  . Sterilization consult 02/21/2015  . Low back pain   . Morbid obesity ([place])   . Hyperlipidemia   . Hypertension   . IFG (impaired fasting glucose)   . Migraines     Past Medical History:  Diagnosis Date  . Cancer ([place]) 07/252019   breast  . CHF (congestive heart failure) ([place])    POSTPARTUM 2017/ RESOLVED  . Eczema   . Hemoglobin C trait ([place]) 11/29/2015  . Hyperlipidemia   . Hypertension   . IFG (impaired fasting glucose)   . Low back pain   . Migraines   . Morbid obesity ([place])  Past Surgical History:  Procedure Laterality Date  . AXILLARY LYMPH NODE BIOPSY Right 12/01/2017   METASTATIC MAMMARY CARCINOMA  . AXILLARY LYMPH NODE BIOPSY Left 12/01/2017   NEGATIVE FOR MALIGNANCY  . BREAST BIOPSY Right 12/01/2017   11 and 12 o'clock, INVASIVE MAMMARY CARCINOMA, ER/PR positive HER2 negative  . MASTECTOMY Right 01/05/2018    Social History   Tobacco Use  . Smoking status: Never Smoker  . Smokeless tobacco: Never Used  Substance Use Topics  . Alcohol use: No     Current Outpatient Medications:  .  amLODipine (NORVASC) 5 MG tablet, Take 1 tablet (5 mg total) by mouth daily., Disp: 90 tablet, Rfl: 3 .  betamethasone dipropionate (DIPROLENE) 0.05 % ointment, Apply 1 application  topically 2 (two) times daily as needed. For rash on wrist, Disp: , Rfl: 0 .  Cholecalciferol (VITAMIN D) 2000 UNITS CAPS, Take 2,000 Units by mouth daily., Disp: , Rfl:  .  EPIDUO FORTE 0.3-2.5 % GEL, Apply 1 application topically at bedtime., Disp: , Rfl: 2 .  famotidine (PEPCID) 40 MG tablet, Take 40 mg by mouth as needed. , Disp: , Rfl:  .  fluticasone (FLONASE) 50 MCG/ACT nasal spray, Place 2 sprays into both nostrils daily as needed. (Patient taking differently: Place 2 sprays into both nostrils daily as needed for allergies. ), Disp: 48 g, Rfl: 3 .  hydrALAZINE (APRESOLINE) 50 MG tablet, TAKE 1 TABLET BY MOUTH 3  TIMES DAILY, Disp: 270 tablet, Rfl: 2 .  hydrocortisone valerate cream (WESTCORT) 0.2 %, Apply 1 application topically as needed (itching). , Disp: , Rfl:  .  labetalol (NORMODYNE) 200 MG tablet, TAKE 1 TABLET BY MOUTH 3  TIMES DAILY, Disp: 270 tablet, Rfl: 1 .  letrozole (FEMARA) 2.5 MG tablet, Take 2.5 mg by mouth daily. , Disp: , Rfl:  .  Olopatadine HCl 0.2 % SOLN, One drop in each eye daily (Patient taking differently: Place 1 drop into both eyes daily as needed (for allergy eyes). ), Disp: 2.5 mL, Rfl: 11 .  pentoxifylline (TRENTAL) 400 MG CR tablet, Take 400 mg by mouth 3 (three) times daily with meals. , Disp: , Rfl:  .  Vitamin D, Ergocalciferol, (DRISDOL) 1.25 MG (50000 UT) CAPS capsule, Take 50,000 Units by mouth every 7 (seven) days. , Disp: , Rfl:  .  Blood Pressure Monitoring (BLOOD PRESSURE MONITOR/L CUFF) MISC, Large cuff; check BP; fluctuating blood pressures; HTN; LON 99 months, Disp: 1 each, Rfl: 0 .  Insulin Pen Needle (PEN NEEDLES) 31G X 8 MM MISC, Use with Saxenda pen; inject subcutaneously once a day, Disp: 30 each, Rfl: 2 .  SAXENDA 18 MG/3ML SOPN, INJECT 3 MG INTO THE SKIN DAILY (Patient not taking: Reported on 06/22/2018), Disp: 5 pen, Rfl: 1  Allergies  Allergen Reactions  . Chlorthalidone Other (See Comments)    Severe hypokalemia    ROS   No other  specific complaints in a complete review of systems (except as listed in HPI above).  Objective  Vitals:   09/20/18 0837  BP: 133/83  Pulse: 79  Temp: (!) 97.4 F (36.3 C)  TempSrc: Oral  Weight: 288 lb (130.6 kg)  Height: '5\' 5"'$  (1.651 m)     Body mass index is 47.93 kg/m.  Nursing Note and Vital Signs reviewed.  Physical Exam   Constitutional: Patient appears well-developed and well-nourished. No distress.  HENT: Head: Normocephalic and atraumatic. Cardiovascular: Normal rate Pulmonary/Chest: Effort normal  Neurological: alert and oriented, speech normal.  Skin:  No rash noted. No erythema.  Psychiatric: Patient has a normal mood and affect. behavior is normal. Judgment and thought content normal.    Assessment & Plan  1. Essential hypertension Stable continue medications, discussed with continued weight loss we can consider altering medications for easier regimen in the future.   2. Malignant neoplasm of upper-outer quadrant of right breast in female, estrogen receptor positive ([place]) Continue follow up with specialists   3. GERD without esophagitis Much improved off of chemo  4. Other hyperlipidemia Discussed diet   5. Morbid obesity ([place]) Discussed quarantine diet, has been losing weight since off of steroids      Follow Up Instructions:   3 months routine  I discussed the assessment and treatment plan with the patient. The patient was provided an opportunity to ask questions and all were answered. The patient agreed with the plan and demonstrated an understanding of the instructions.   The patient was advised to call back or seek an in-person evaluation if the symptoms worsen or if the condition fails to improve as anticipated.  I provided 21 minutes of non-face-to-face time during this encounter, 16 minutes on video and additional time in chart review.   Fredderick Severance, NP

## 2018-09-22 ENCOUNTER — Other Ambulatory Visit: Payer: Self-pay | Admitting: Nurse Practitioner

## 2018-09-22 ENCOUNTER — Telehealth: Payer: Self-pay | Admitting: Family Medicine

## 2018-09-22 MED ORDER — LABETALOL HCL 200 MG PO TABS: ORAL_TABLET | ORAL | 1 refills | Status: DC

## 2018-09-22 MED ORDER — HYDRALAZINE HCL 50 MG PO TABS: 50.0000 mg | ORAL_TABLET | Freq: Three times a day (TID) | ORAL | 2 refills | Status: DC

## 2018-09-22 MED ORDER — AMLODIPINE BESYLATE 5 MG PO TABS: 5.0000 mg | ORAL_TABLET | Freq: Every day | ORAL | 3 refills | Status: DC

## 2018-09-22 NOTE — Telephone Encounter (Signed)
Refills sent

## 2018-09-22 NOTE — Telephone Encounter (Signed)
Copied from [place]. Topic: Quick Communication - Rx Refill/Question >> Sep 22, 2018  9:57 AM Mcneil, Jacinto Reap wrote: Medication: amLODipine (NORVASC) 5 MG tablet, labetalol (NORMODYNE) 200 MG tablet, and hydrALAZINE (APRESOLINE) 50 MG tablet  Has the patient contacted their pharmacy? yes   Preferred Pharmacy (with phone number or street name): [id number] [address], [place] [phone] (Phone)  [phone] (Fax)  Agent: Please be advised that RX refills may take up to 3 business days. We ask that you follow-up with your pharmacy.

## 2018-09-29 ENCOUNTER — Other Ambulatory Visit: Payer: Self-pay

## 2018-09-29 ENCOUNTER — Ambulatory Visit: Payer: Medicaid Other | Attending: Physician Assistant | Admitting: Occupational Therapy

## 2018-09-29 DIAGNOSIS — M5441 Lumbago with sciatica, right side: Secondary | ICD-10-CM | POA: Insufficient documentation

## 2018-09-29 DIAGNOSIS — I972 Postmastectomy lymphedema syndrome: Secondary | ICD-10-CM | POA: Diagnosis not present

## 2018-09-29 DIAGNOSIS — G8929 Other chronic pain: Secondary | ICD-10-CM | POA: Diagnosis not present

## 2018-09-29 DIAGNOSIS — M25611 Stiffness of right shoulder, not elsewhere classified: Secondary | ICD-10-CM | POA: Insufficient documentation

## 2018-09-29 NOTE — Patient Instructions (Signed)
Pt to do her AAROM stretches and ROM on wall for shoulder flexion , and ABD on wall  10 reps  2 x day  And pect stretch in corner - 10 reps hold 5-10 sec  Scar massage on R breast/chest and under arm   Cont with pump 2 x day  And compression sleeve and glove daytime  And tubigrip and isotoner glove night time

## 2018-09-29 NOTE — Therapy (Signed)
[facility] [address] [place], [address] Phone: [phone]   Fax:  [phone]  Occupational Therapy Treatment  Patient Details  Name: Mckenzie [facility] MRN: [id number] Date of Birth: 1986/01/14 Referring Provider (OT): Garlan Fair   Encounter Date: 09/29/2018  OT End of Session - 09/29/18 1054    Visit Number  13    Number of Visits  14    Date for OT Re-Evaluation  10/20/18    Authorization - Visit Number  1    Authorization - Number of Visits  3    OT Start Time  1001    OT Stop Time  1041    OT Time Calculation (min)  40 min    Activity Tolerance  Patient tolerated treatment well    Behavior During Therapy  [facility] for tasks assessed/performed       Past Medical History:  Diagnosis Date  . Cancer ([place]) 07/252019   breast  . CHF (congestive heart failure) ([place])    POSTPARTUM 2017/ RESOLVED  . Eczema   . Hemoglobin C trait ([place]) 11/29/2015  . Hyperlipidemia   . Hypertension   . IFG (impaired fasting glucose)   . Low back pain   . Migraines   . Morbid obesity ([place])     Past Surgical History:  Procedure Laterality Date  . AXILLARY LYMPH NODE BIOPSY Right 12/01/2017   METASTATIC MAMMARY CARCINOMA  . AXILLARY LYMPH NODE BIOPSY Left 12/01/2017   NEGATIVE FOR MALIGNANCY  . BREAST BIOPSY Right 12/01/2017   11 and 12 o'clock, INVASIVE MAMMARY CARCINOMA, ER/PR positive HER2 negative  . MASTECTOMY Right 01/05/2018    There were no vitals filed for this visit.  Subjective Assessment - 09/29/18 1050    Subjective   Doing okay - love the pump - I can tell my arm is softer - but my shoulder bother me some times more - feels tighter and some soreness in my shoulder with some shooting pain     Patient Stated Goals  I want to keep the lympedema under control     Currently in Pain?  No/denies         [facility] OT Assessment - 09/29/18 0001      AROM   Right Shoulder Extension  70 Degrees     Right Shoulder Flexion  145 Degrees    Right Shoulder ABduction  150 Degrees    Left Shoulder Extension  70 Degrees    Left Shoulder Flexion  180 Degrees    Left Shoulder ABduction  180 Degrees      LYMPHEDEMA/ONCOLOGY QUESTIONNAIRE - 09/29/18 1010      Right Upper Extremity Lymphedema   15 cm Proximal to Olecranon Process  49.3 cm    10 cm Proximal to Olecranon Process  43.5 cm    Olecranon Process  33.2 cm    15 cm Proximal to Ulnar Styloid Process  31.5 cm    10 cm Proximal to Ulnar Styloid Process  27 cm    Just Proximal to Ulnar Styloid Process  22.5 cm    Across Hand at [employer]  22.8 cm    At [place] of 2nd Digit  8 cm    At [facility] of Thumb  7.8 cm        Pt arrive with her daytime compression on - she showed decrease circumference in whole R UE except wrist - increase   pt ed on wearing sleeve and glove  again - to make sure compression correct and length at wrist   Pt using her new pump 2 x day am and pm  And jovipak on breast    Pt do show decrease AROM in R shoulder - increase stiffness on chest wall since after radiation - pt ed on soft tissue mobs  Pt report increase discomfort and pain in shoulder at times -and shooting pain in arm  Review AAROM and stretches for R shoulder Flexion , ABD and pect stretch   to be done - 2 x day to decrease stiffness                 OT Education - 09/29/18 1053    Education Details  soft tissue tightness , ROM to R shoulder - and compression /pump use     Person(s) Educated  Patient    Methods  Explanation;Demonstration;Handout    Comprehension  Verbalized understanding;Returned demonstration          OT Long Term Goals - 09/08/18 1554      OT LONG TERM GOAL #1   Title  Pt to be independent in self MLD  and wearing correct compression to maintain lymphedema under control     Status  Achieved      OT LONG TERM GOAL #2   Title  Monitor lymphedema in R  UE  for pt to decrease /maintain circumference with  use of new pump and daytime compression     Baseline  just started using pump this week - had radiation burns - jovipak she was uanble to use yet     Time  6    Period  Weeks    Status  New    Target Date  10/20/18      OT LONG TERM GOAL #3   Title  Fibrosis in L foream and upper arm decrease with use of pump and daytime compression to prevent cellulitis     Baseline  Fribrosis in foream and distal upper arm - due to long standing lymphedema and not having correct pump     Time  6    Period  Weeks    Status  New    Target Date  10/20/18            Plan - 09/29/18 1054    Clinical Impression Statement  Pt arrive with her daytime compression sleeve and glove on - report she has been wearing that during day and using the pump am and pm  - pt finished radiation the last 8 wks and pt report increase tightness and stiffness with pain in R shoulder - pt show decrease AROM in R shoulder compare to prior to radiation - pt to focus on PROM and AAROM HEP and review wtih pt again this date - as well as some soft tissue mobs /massage - and cont with compression and pump - showed great progress in decrease of circumference  since last time     Occupational performance deficits (Please refer to evaluation for details):  IADL's;Work;Leisure;Play    Body Structure / Function / Physical Skills  ADL;Scar mobility;Edema;Sensation;UE functional use    Rehab Potential  Good    Clinical Decision Making  Limited treatment options, no task modification necessary    OT Frequency  Biweekly    OT Duration  4 weeks    OT Treatment/Interventions  Self-care/ADL training;Manual lymph drainage;Patient/family education;Therapeutic exercise;Scar mobilization;DME and/or AE instruction    Plan  assess  ROM and soft  tissue R shoulder and chest - circumferencd and  use of custom garments and permanent pump with vest    OT Home Exercise Plan  see pt instruction     Consulted and Agree with Plan of Care  Patient        Patient will benefit from skilled therapeutic intervention in order to improve the following deficits and impairments:  Body Structure / Function / Physical Skills  Visit Diagnosis: Postmastectomy lymphedema syndrome  Stiffness of right shoulder, not elsewhere classified  Chronic right-sided low back pain with right-sided sciatica    Problem List Patient Active Problem List   Diagnosis Date Noted  . Malignant neoplasm of upper-outer quadrant of right breast in female, estrogen receptor positive ([place]) 12/07/2017  . Allergic conjunctivitis of both eyes 09/16/2017  . Family history of breast cancer 10/13/2016  . Dysplasia of cervix, low grade (CIN 1) 10/13/2016  . Thrombocytosis ([place]) 03/16/2016  . Hemoglobin C trait ([place]) 11/29/2015  . Hematuria 02/21/2015  . Vitamin D deficiency 02/21/2015  . Sterilization consult 02/21/2015  . Low back pain   . Morbid obesity ([place])   . Hyperlipidemia   . Hypertension   . IFG (impaired fasting glucose)   . Migraines     Ayyub Krall OTR/L,CLT 09/29/2018, 3:12 PM  [facility] [address], [place], [address] Phone: [phone]   Fax:  [phone]  Name: Mckenzie [facility] MRN: [id number] Date of Birth: May 29, 1985

## 2018-10-04 DIAGNOSIS — Z170 Estrogen receptor positive status [ER+]: Secondary | ICD-10-CM | POA: Diagnosis not present

## 2018-10-04 DIAGNOSIS — G629 Polyneuropathy, unspecified: Secondary | ICD-10-CM | POA: Diagnosis not present

## 2018-10-04 DIAGNOSIS — C50811 Malignant neoplasm of overlapping sites of right female breast: Secondary | ICD-10-CM | POA: Diagnosis not present

## 2018-10-06 DIAGNOSIS — C50111 Malignant neoplasm of central portion of right female breast: Secondary | ICD-10-CM | POA: Diagnosis not present

## 2018-10-06 DIAGNOSIS — Z4431 Encounter for fitting and adjustment of external right breast prosthesis: Secondary | ICD-10-CM | POA: Diagnosis not present

## 2018-10-13 ENCOUNTER — Ambulatory Visit: Payer: Medicaid Other | Attending: Physician Assistant | Admitting: Occupational Therapy

## 2018-10-13 ENCOUNTER — Other Ambulatory Visit: Payer: Self-pay

## 2018-10-13 DIAGNOSIS — I972 Postmastectomy lymphedema syndrome: Secondary | ICD-10-CM | POA: Insufficient documentation

## 2018-10-13 DIAGNOSIS — M5441 Lumbago with sciatica, right side: Secondary | ICD-10-CM | POA: Insufficient documentation

## 2018-10-13 DIAGNOSIS — M25611 Stiffness of right shoulder, not elsewhere classified: Secondary | ICD-10-CM | POA: Insufficient documentation

## 2018-10-13 DIAGNOSIS — G8929 Other chronic pain: Secondary | ICD-10-CM

## 2018-10-13 NOTE — Patient Instructions (Signed)
Same as last time  

## 2018-10-13 NOTE — Therapy (Signed)
[place] [address], [place], [address] Phone: [phone]   Fax:  [phone]  Occupational Therapy Treatment  Patient Details  Name: Mckenzie Lopez MRN: [id number] Date of Birth: 1986-02-22 Referring Provider (OT): Garlan Fair   Encounter Date: 10/13/2018  OT End of Session - 10/13/18 1047    Visit Number  14    Number of Visits  15    Date for OT Re-Evaluation  06/13/18    Authorization - Visit Number  2    Authorization - Number of Visits  3    OT Start Time  1001    OT Stop Time  2778    OT Time Calculation (min)  43 min    Activity Tolerance  Patient tolerated treatment well    Behavior During Therapy  [facility] for tasks assessed/performed       Past Medical History:  Diagnosis Date  . Cancer ([place]) 07/252019   breast  . CHF (congestive heart failure) ([place])    POSTPARTUM 2017/ RESOLVED  . Eczema   . Hemoglobin C trait ([place]) 11/29/2015  . Hyperlipidemia   . Hypertension   . IFG (impaired fasting glucose)   . Low back pain   . Migraines   . Morbid obesity ([place])     Past Surgical History:  Procedure Laterality Date  . AXILLARY LYMPH NODE BIOPSY Right 12/01/2017   METASTATIC MAMMARY CARCINOMA  . AXILLARY LYMPH NODE BIOPSY Left 12/01/2017   NEGATIVE FOR MALIGNANCY  . BREAST BIOPSY Right 12/01/2017   11 and 12 o'clock, INVASIVE MAMMARY CARCINOMA, ER/PR positive HER2 negative  . MASTECTOMY Right 01/05/2018    There were no vitals filed for this visit.  Subjective Assessment - 10/13/18 1046    Subjective   Doing okay - my shoulder pain is better and I can reach over head- not as tight - neuropathy still the same - my arm do not feel to tight or heavy - doing my pump and sleeve     Patient Stated Goals  I want to keep the lympedema under control     Currently in Pain?  No/denies         [facility] OT Assessment - 10/13/18 0001      AROM   Right Shoulder Flexion  158 Degrees     Right Shoulder ABduction  156 Degrees      Strength   Right Hand Grip (lbs)  55    Right Hand Lateral Pinch  15 lbs    Right Hand 3 Point Pinch  11 lbs    Left Hand Grip (lbs)  54    Left Hand Lateral Pinch  17 lbs    Left Hand 3 Point Pinch  13 lbs      LYMPHEDEMA/ONCOLOGY QUESTIONNAIRE - 10/13/18 1005      Right Upper Extremity Lymphedema   15 cm Proximal to Olecranon Process  49.5 cm    10 cm Proximal to Olecranon Process  46.5 cm    Olecranon Process  35.4 cm    15 cm Proximal to Ulnar Styloid Process  34 cm    10 cm Proximal to Ulnar Styloid Process  28 cm    Just Proximal to Ulnar Styloid Process  22 cm    Across Hand at [employer]  22.4 cm    At [place] of 2nd Digit  7.4 cm    At [facility] of Thumb  7.8 cm  Pt arrive without her compression sleeve and glove- pt did no pump this am yet after dropping of  Her daughter  Her circumference decrease in L  hand and wrist this week - but forearm to upper arm increase significantly - see flow sheet  upon questioning - pt did eat pizza yesterday and can be some water retention   Pt do report cont neuropathy in fingers and feet - was started per pt on Gabapentin 5/21 - grip and prehension strength assess - see flow sheet  Pt pain in R shoulder improved as well as her ROM - she has been doing her shoulder AAROM for ABD and flexion on wall with great results -as well as pect stretches on corner  - see flowsheet with AROM improvements  Pt to cont with these AAROM   Provided her with new tubigrip soft for hand to elbow and tubigrip F for wrist to upper arm to wear at night time in combination of isotoner glove   Will reassess her next week - but around lunch time appt -after she has done pump and wore her daytime compression sleeve               OT Education - 10/13/18 1047    Education Details  soft tissue tightness , ROM to R shoulder - and compression /pump use     Person(s) Educated  Patient    Methods   Explanation;Demonstration;Handout    Comprehension  Verbalized understanding;Returned demonstration          OT Long Term Goals - 09/08/18 1554      OT LONG TERM GOAL #1   Title  Pt to be independent in self MLD  and wearing correct compression to maintain lymphedema under control     Status  Achieved      OT LONG TERM GOAL #2   Title  Monitor lymphedema in R  UE  for pt to decrease /maintain circumference with use of new pump and daytime compression     Baseline  just started using pump this week - had radiation burns - jovipak she was uanble to use yet     Time  6    Period  Weeks    Status  New    Target Date  10/20/18      OT LONG TERM GOAL #3   Title  Fibrosis in L foream and upper arm decrease with use of pump and daytime compression to prevent cellulitis     Baseline  Fribrosis in foream and distal upper arm - due to long standing lymphedema and not having correct pump     Time  6    Period  Weeks    Status  New    Target Date  10/20/18            Plan - 10/13/18 1048    Clinical Impression Statement  Pt show increase R shoulder AROM since 2 wks ago - when she had some increase stiffness and pain because of radiation finished aboutr 10 wks ago - pt report no shoulder pain - cont neuropathy in hands and feet - this date her circumference was increase significantly in forearm to upper arm - pt did not pump this am yet and come without her daysleeve in - she report she had pizza yesterday - pt to follow up with me one more session next week - after doing her pump in am and wearing her sleeve - will make appt around lunch  time     Occupational performance deficits (Please refer to evaluation for details):  IADL's;Work;Leisure;Play    Body Structure / Function / Physical Skills  ADL;Scar mobility;Edema;Sensation;UE functional use    Rehab Potential  Good    Clinical Decision Making  Limited treatment options, no task modification necessary    Modification or Assistance to  Complete Evaluation   Min-Moderate modification of tasks or assist with assess necessary to complete eval    OT Frequency  1x / week    OT Duration  2 weeks    OT Treatment/Interventions  Self-care/ADL training;Manual lymph drainage;Patient/family education;Therapeutic exercise;Scar mobilization;DME and/or AE instruction    Plan  assess  ROM and soft tissue R shoulder and chest - circumferencd and  use of custom garments and permanent pump with vest    OT Home Exercise Plan  see pt instruction     Consulted and Agree with Plan of Care  Patient       Patient will benefit from skilled therapeutic intervention in order to improve the following deficits and impairments:  Body Structure / Function / Physical Skills  Visit Diagnosis: Postmastectomy lymphedema syndrome  Stiffness of right shoulder, not elsewhere classified  Chronic right-sided low back pain with right-sided sciatica    Problem List Patient Active Problem List   Diagnosis Date Noted  . Malignant neoplasm of upper-outer quadrant of right breast in female, estrogen receptor positive ([place]) 12/07/2017  . Allergic conjunctivitis of both eyes 09/16/2017  . Family history of breast cancer 10/13/2016  . Dysplasia of cervix, low grade (CIN 1) 10/13/2016  . Thrombocytosis ([place]) 03/16/2016  . Hemoglobin C trait ([place]) 11/29/2015  . Hematuria 02/21/2015  . Vitamin D deficiency 02/21/2015  . Sterilization consult 02/21/2015  . Low back pain   . Morbid obesity ([place])   . Hyperlipidemia   . Hypertension   . IFG (impaired fasting glucose)   . Migraines     Camp Gopal OTR/L,CLT 10/13/2018, 10:53 AM  [place] [facility] [address], [place], [address] Phone: [phone]   Fax:  [phone]  Name: Mckenzie Lopez MRN: [id number] Date of Birth: [birth year]

## 2018-10-20 ENCOUNTER — Other Ambulatory Visit: Payer: Self-pay

## 2018-10-20 ENCOUNTER — Ambulatory Visit: Payer: Medicaid Other | Admitting: Occupational Therapy

## 2018-10-20 DIAGNOSIS — G8929 Other chronic pain: Secondary | ICD-10-CM | POA: Diagnosis not present

## 2018-10-20 DIAGNOSIS — M25611 Stiffness of right shoulder, not elsewhere classified: Secondary | ICD-10-CM | POA: Diagnosis not present

## 2018-10-20 DIAGNOSIS — I972 Postmastectomy lymphedema syndrome: Secondary | ICD-10-CM | POA: Diagnosis not present

## 2018-10-20 DIAGNOSIS — M5441 Lumbago with sciatica, right side: Secondary | ICD-10-CM | POA: Diagnosis not present

## 2018-10-20 NOTE — Patient Instructions (Signed)
Cont with same HEP  

## 2018-10-20 NOTE — Therapy (Signed)
[facility] [address] [place], [address] Phone: [phone]   Fax:  [phone]  Occupational Therapy Treatment  Patient Details  Name: Mckenzie Lopez MRN: [id number] Date of Birth: Mar 27, 1986 Referring Provider (OT): Garlan Fair   Encounter Date: 10/20/2018  OT End of Session - 10/20/18 1420    Visit Number  15    Number of Visits  15    Date for OT Re-Evaluation  10/20/18    Authorization - Visit Number  3    Authorization - Number of Visits  3    OT Start Time  1330    OT Stop Time  1408    OT Time Calculation (min)  38 min    Activity Tolerance  Patient tolerated treatment well    Behavior During Therapy  [facility] for tasks assessed/performed       Past Medical History:  Diagnosis Date  . Cancer ([place]) 07/252019   breast  . CHF (congestive heart failure) ([place])    POSTPARTUM 2017/ RESOLVED  . Eczema   . Hemoglobin C trait ([place]) 11/29/2015  . Hyperlipidemia   . Hypertension   . IFG (impaired fasting glucose)   . Low back pain   . Migraines   . Morbid obesity ([place])     Past Surgical History:  Procedure Laterality Date  . AXILLARY LYMPH NODE BIOPSY Right 12/01/2017   METASTATIC MAMMARY CARCINOMA  . AXILLARY LYMPH NODE BIOPSY Left 12/01/2017   NEGATIVE FOR MALIGNANCY  . BREAST BIOPSY Right 12/01/2017   11 and 12 o'clock, INVASIVE MAMMARY CARCINOMA, ER/PR positive HER2 negative  . MASTECTOMY Right 01/05/2018    There were no vitals filed for this visit.  Subjective Assessment - 10/20/18 1418    Subjective   Doing okay - my arm do no feel tight - has some shoulder pain at night - but feels like tight muscle or stress - done my sleeves and night/morning pump - and did not eat pizza last night    Pertinent History   R modified radical mastectomy with lymph node dissection; negative margins; 5 of 21 lymph nodes positive for metastatic carcinoma;01/31/18 Chemotherapy Initiated:  Dose-dense doxorubicin-cyclophosphamide (ddAC) q2 weeks x 4 cycles followed by weekly paclitaxel (weekly-T).    Patient Stated Goals  I want to keep the lympedema under control     Currently in Pain?  No/denies          LYMPHEDEMA/ONCOLOGY QUESTIONNAIRE - 10/20/18 1331      Right Upper Extremity Lymphedema   15 cm Proximal to Olecranon Process  49.5 cm    10 cm Proximal to Olecranon Process  46.4 cm    Olecranon Process  34 cm    15 cm Proximal to Ulnar Styloid Process  33 cm    10 cm Proximal to Ulnar Styloid Process  28.5 cm    Just Proximal to Ulnar Styloid Process  21 cm    Across Hand at [employer]  23 cm    At [place] of 2nd Digit  7.4 cm    At [facility] of Thumb  7.6 cm        Pt arrive with her  [employer] custom elvarex soft compression sleeve and glove on - she do pump in am and pm    Her circumference decrease in L  wrist , 15 cm forearm, elbow and stayed same proximal upper arm  But increase in 10 cm forearm and distal upper arm  -  see flow sheet   Pt do report cont neuropathy in fingers and feet - was started per pt on Gabapentin 5/21 -   Pt pain in R shoulder improved as well as her ROM - she has been doing her shoulder AAROM for ABD and flexion on wall with great results -as well as pect stretches on corner  - see flowsheet with AROM improvements  But report still sometimes at night time shoulder pain - feels like tight   pt is tight and tender over L upper traps - pt to do some massage at home and can do stretches in shower for L upper traps    Provided her  Last time with new tubigrip soft for hand to elbow and tubigrip F for wrist to upper arm to wear at night time in combination of new  isotoner glove today   Pt to contact me in Aug about replacement for her daysleeve and glove - assess if needs night time compression sleeve               OT Education - 10/20/18 1419    Education Details  discharge instruction - wil follow up with me in Aug for new  sleeve and glove    Person(s) Educated  Patient    Methods  Explanation;Demonstration;Handout    Comprehension  Verbalized understanding;Returned demonstration          OT Long Term Goals - 10/20/18 1424      OT LONG TERM GOAL #1   Title  Pt to be independent in self MLD  and wearing correct compression to maintain lymphedema under control     Status  Achieved      OT LONG TERM GOAL #2   Title  Monitor lymphedema in R  UE  for pt to decrease /maintain circumference with use of new pump and daytime compression     Baseline  pt maintain at 4.4 cm increase at upper arm, 2.5 cm at elbow, forearm and hand , and .5 at wrist    Status  Achieved      OT LONG TERM GOAL #3   Title  Fibrosis in L foream and upper arm decrease with use of pump and daytime compression to prevent cellulitis     Status  Achieved            Plan - 10/20/18 1421    Clinical Impression Statement  Pt showed some tightness in R upper trap - showed some HEP stretches - pt measurements fluctuate since Febr - prior to radiation to now but able to keep it under control with pump am and pm - and custom jobs elvarex soft sleeve and glove - pt to cont with homeprogram for 2 months -and contact me in Aug when her sleeve need to be replace - will assess at that time if she needs night time compression - at the moment she wears isotoner glove with tubigrip foream and upper arm    Occupational performance deficits (Please refer to evaluation for details):  IADL's;Work;Leisure;Play    Body Structure / Function / Physical Skills  ADL;Scar mobility;Edema;Sensation;UE functional use    Rehab Potential  Good    Clinical Decision Making  Limited treatment options, no task modification necessary    Modification or Assistance to Complete Evaluation   Min-Moderate modification of tasks or assist with assess necessary to complete eval    Plan  discharge with homeprogram    OT Home Exercise Plan  see pt instruction  Consulted and  Agree with Plan of Care  Patient       Patient will benefit from skilled therapeutic intervention in order to improve the following deficits and impairments:  Body Structure / Function / Physical Skills  Visit Diagnosis: Stiffness of right shoulder, not elsewhere classified  Chronic right-sided low back pain with right-sided sciatica    Problem List Patient Active Problem List   Diagnosis Date Noted  . Malignant neoplasm of upper-outer quadrant of right breast in female, estrogen receptor positive ([place]) 12/07/2017  . Allergic conjunctivitis of both eyes 09/16/2017  . Family history of breast cancer 10/13/2016  . Dysplasia of cervix, low grade (CIN 1) 10/13/2016  . Thrombocytosis ([place]) 03/16/2016  . Hemoglobin C trait ([place]) 11/29/2015  . Hematuria 02/21/2015  . Vitamin D deficiency 02/21/2015  . Sterilization consult 02/21/2015  . Low back pain   . Morbid obesity ([place])   . Hyperlipidemia   . Hypertension   . IFG (impaired fasting glucose)   . Migraines     Sabrine Patchen OTR/L,CLT 10/20/2018, 2:27 PM  [place] [facility] [address], [place], [address] Phone: [phone]   Fax:  [phone]  Name: Mckenzie Lopez MRN: [id number] Date of Birth: [birth year]

## 2018-10-31 DIAGNOSIS — Z170 Estrogen receptor positive status [ER+]: Secondary | ICD-10-CM | POA: Diagnosis not present

## 2018-10-31 DIAGNOSIS — C50811 Malignant neoplasm of overlapping sites of right female breast: Secondary | ICD-10-CM | POA: Diagnosis not present

## 2018-11-07 ENCOUNTER — Encounter: Payer: Self-pay | Admitting: Obstetrics and Gynecology

## 2018-11-07 NOTE — Progress Notes (Signed)
Chief Complaint  Patient presents with  . Gynecologic Exam  . [employer] Employee     HPI:      Mckenzie Lopez is a 33 y.o. G1P1001 who LMP was Patient's last menstrual period was 02/08/2018 (exact date)., presents today for her annual examination. Her menses are absent since 10/19 chemo start, now on femara. No BTB since. Pt plans to have [facility] 7/20.  Has vasomotor sx with femara.  Sex activity: not sexually active Last Pap: 10/13/16  Results were: no abnormalities /neg HPV DNA  Hx of STDs: chlamydia, CIN 1 in 2014  Last mammo: 11/26/17 Results: Birads-5. Bx with Dr. Bary Castilla showed RT intraductal carcinoma with mets to axillary lymph nodes. Did chemo at [facility], now on Femara for 10 yrs. 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 Bary Castilla; panel testing not done. Plans for [facility] 7/20  There is a FH of breast cancer in her MGM and mat aunt, genetic testing not done by them. Pt is BRCA neg but panel testing not done. Will do today. There is no FH of ovarian cancer. The patient does do self-breast exams.   Tobacco use: The patient denies current or previous tobacco use. Alcohol use: none Exercise: moderately active  She does not get adequate calcium but does get Vitamin D in her diet.  She has her labs managed with PCP.   Past Medical History:  Diagnosis Date  . BRCA negative 2019   Dr. Bary Castilla  . CHF (congestive heart failure) ([place])    POSTPARTUM 2017/ RESOLVED  . Eczema   . Hemoglobin C trait ([place]) 11/29/2015  . Hyperlipidemia   . Hypertension   . IFG (impaired fasting glucose)   . Intraductal carcinoma of right breast 11/2017   ER/PR+; Her2neu neg; mets to LN  . Low back pain   . Migraines   . Morbid obesity ([place])     Past Surgical History:  Procedure Laterality Date  . AXILLARY LYMPH NODE BIOPSY Right 12/01/2017   METASTATIC MAMMARY CARCINOMA  . AXILLARY LYMPH NODE BIOPSY Left 12/01/2017   NEGATIVE FOR MALIGNANCY  . BREAST BIOPSY Right 12/01/2017   11  and 12 o'clock, INVASIVE MAMMARY CARCINOMA, ER/PR positive HER2 negative  . MASTECTOMY Right 01/05/2018    Family History  Problem Relation Age of Onset  . Diabetes Mother   . Hypertension Mother   . Thyroid disease Mother   . Breast cancer Maternal Grandmother        ?age  . Diabetes Maternal Grandfather   . Hypertension Maternal Grandfather   . Seizures Maternal Grandfather   . Breast cancer Maternal Aunt        early 4's  . Diabetes Sister   . Diabetes Paternal Grandmother   . Heart disease Neg Hx   . Stroke Neg Hx   . COPD Neg Hx   . Ovarian cancer Neg Hx     Social History   Socioeconomic History  . Marital status: Single    Spouse name: Not on file  . Number of children: Not on file  . Years of education: Not on file  . Highest education level: Not on file  Occupational History  . Not on file  Social Needs  . Financial resource strain: Not on file  . Food insecurity    Worry: Not on file    Inability: Not on file  . Transportation needs    Medical: Not on file    Non-medical: Not on file  Tobacco Use  .  Smoking status: Never Smoker  . Smokeless tobacco: Never Used  Substance and Sexual Activity  . Alcohol use: No  . Drug use: No  . Sexual activity: Not Currently    Birth control/protection: None  Lifestyle  . Physical activity    Days per week: Not on file    Minutes per session: Not on file  . Stress: Not on file  Relationships  . Social Herbalist on phone: Not on file    Gets together: Not on file    Attends religious service: Not on file    Active member of club or organization: Not on file    Attends meetings of clubs or organizations: Not on file    Relationship status: Not on file  . Intimate partner violence    Fear of current or ex partner: Not on file    Emotionally abused: Not on file    Physically abused: Not on file    Forced sexual activity: Not on file  Other Topics Concern  . Not on file  Social History Narrative   . Not on file    Current Outpatient Medications on File Prior to Visit  Medication Sig Dispense Refill  . amLODipine (NORVASC) 5 MG tablet Take 1 tablet (5 mg total) by mouth daily. 90 tablet 3  . Blood Pressure Monitoring (BLOOD PRESSURE MONITOR/L CUFF) MISC Large cuff; check BP; fluctuating blood pressures; HTN; LON 99 months 1 each 0  . Cholecalciferol (VITAMIN D) 2000 UNITS CAPS Take 2,000 Units by mouth daily.    . famotidine (PEPCID) 40 MG tablet Take 40 mg by mouth as needed.     . fluticasone (FLONASE) 50 MCG/ACT nasal spray Place 2 sprays into both nostrils daily as needed. (Patient taking differently: Place 2 sprays into both nostrils daily as needed for allergies. ) 48 g 3  . gabapentin (NEURONTIN) 100 MG capsule     . hydrALAZINE (APRESOLINE) 50 MG tablet Take 1 tablet (50 mg total) by mouth 3 (three) times daily. 270 tablet 2  . hydrocortisone valerate cream (WESTCORT) 0.2 % Apply 1 application topically as needed (itching).     . labetalol (NORMODYNE) 200 MG tablet TAKE 1 TABLET BY MOUTH 3  TIMES DAILY 270 tablet 1  . letrozole (FEMARA) 2.5 MG tablet Take 2.5 mg by mouth daily.     . Olopatadine HCl 0.2 % SOLN One drop in each eye daily (Patient taking differently: Place 1 drop into both eyes daily as needed (for allergy eyes). ) 2.5 mL 11  . pentoxifylline (TRENTAL) 400 MG CR tablet Take 400 mg by mouth 3 (three) times daily with meals.     . Vitamin D, Ergocalciferol, (DRISDOL) 1.25 MG (50000 UT) CAPS capsule Take 50,000 Units by mouth every 7 (seven) days.      No current facility-administered medications on file prior to visit.     ROS:  Review of Systems  Constitutional: Negative for fatigue, fever and unexpected weight change.  Respiratory: Negative for cough, shortness of breath and wheezing.   Cardiovascular: Negative for chest pain, palpitations and leg swelling.  Gastrointestinal: Negative for blood in stool, constipation, diarrhea, nausea and vomiting.   Endocrine: Negative for cold intolerance, heat intolerance and polyuria.  Genitourinary: Negative for dyspareunia, dysuria, flank pain, frequency, genital sores, hematuria, menstrual problem, pelvic pain, urgency, vaginal bleeding, vaginal discharge and vaginal pain.  Musculoskeletal: Positive for joint swelling. Negative for back pain and myalgias.  Skin: Negative for rash.  Neurological: Positive  for numbness. Negative for dizziness, syncope, light-headedness and headaches.  Hematological: Negative for adenopathy.  Psychiatric/Behavioral: Negative for agitation, confusion, sleep disturbance and suicidal ideas. The patient is not nervous/anxious.      Objective: BP (!) 146/98   Ht '5\' 5"'$  (1.651 m)   Wt 292 lb 6.4 oz (132.6 kg)   LMP 02/08/2018 (Exact Date)   BMI 48.66 kg/m    Physical Exam Constitutional:      Appearance: She is well-developed.  Genitourinary:     Vulva, vagina, uterus, right adnexa and left adnexa normal.     No vulval lesion or tenderness noted.     No vaginal discharge, erythema or tenderness.     No cervical motion tenderness or polyp.     Uterus is not enlarged or tender.     No right or left adnexal mass present.     Right adnexa not tender.     Left adnexa not tender.  Neck:     Musculoskeletal: Normal range of motion.     Thyroid: No thyromegaly.  Cardiovascular:     Rate and Rhythm: Normal rate and regular rhythm.     Heart sounds: Normal heart sounds. No murmur.  Pulmonary:     Effort: Pulmonary effort is normal.     Breath sounds: Normal breath sounds.  Chest:     Breasts:        Right: Absent. No skin change or tenderness.        Left: No mass, nipple discharge, skin change or tenderness.     Comments: RT BREAST ABSENT Abdominal:     Palpations: Abdomen is soft.     Tenderness: There is no abdominal tenderness. There is no guarding.  Musculoskeletal: Normal range of motion.  Neurological:     General: No focal deficit present.      Mental Status: She is alert and oriented to person, place, and time.     Cranial Nerves: No cranial nerve deficit.  Skin:    General: Skin is warm and dry.  Psychiatric:        Mood and Affect: Mood normal.        Behavior: Behavior normal.        Thought Content: Thought content normal.        Judgment: Judgment normal.  Vitals signs reviewed.     Assessment/Plan: Encounter for annual routine gynecological examination -   Cervical cancer screening - Plan: IGP, Aptima HPV,   Screening for HPV (human papillomavirus) - Plan: IGP, Aptima HPV,   Malignant neoplasm of upper-outer quadrant of right breast in female, estrogen receptor positive ([place]) - Plan: [facility] w/o BRCA; Update panel testing done through [facility] today. Will call with results. Being followed by [place]. On femara.  Family history of breast cancer - Plan: [facility] w/o BRCA, Update testing today.         GYN counsel breast self exam, mammography screening, adequate intake of calcium and vitamin D, diet and exercise     F/U  Return in about 1 year (around 11/08/2019).  Alicia B. Copland, PA-C 11/08/2018 9:54 AM

## 2018-11-08 ENCOUNTER — Other Ambulatory Visit: Payer: Self-pay

## 2018-11-08 ENCOUNTER — Encounter: Payer: Self-pay | Admitting: Obstetrics and Gynecology

## 2018-11-08 ENCOUNTER — Ambulatory Visit (INDEPENDENT_AMBULATORY_CARE_PROVIDER_SITE_OTHER): Payer: Medicaid Other | Admitting: Obstetrics and Gynecology

## 2018-11-08 VITALS — BP 146/98 | Ht 65.0 in | Wt 292.4 lb

## 2018-11-08 DIAGNOSIS — Z170 Estrogen receptor positive status [ER+]: Secondary | ICD-10-CM | POA: Diagnosis not present

## 2018-11-08 DIAGNOSIS — Z0000 Encounter for general adult medical examination without abnormal findings: Secondary | ICD-10-CM | POA: Diagnosis not present

## 2018-11-08 DIAGNOSIS — Z803 Family history of malignant neoplasm of breast: Secondary | ICD-10-CM

## 2018-11-08 DIAGNOSIS — C50411 Malignant neoplasm of upper-outer quadrant of right female breast: Secondary | ICD-10-CM

## 2018-11-08 DIAGNOSIS — Z124 Encounter for screening for malignant neoplasm of cervix: Secondary | ICD-10-CM

## 2018-11-08 DIAGNOSIS — Z01419 Encounter for gynecological examination (general) (routine) without abnormal findings: Secondary | ICD-10-CM

## 2018-11-08 DIAGNOSIS — Z1151 Encounter for screening for human papillomavirus (HPV): Secondary | ICD-10-CM

## 2018-11-08 NOTE — Patient Instructions (Signed)
I value your feedback and entrusting us with your care. If you get a [place] patient survey, I would appreciate you taking the time to let us know about your experience today. Thank you! 

## 2018-11-09 DIAGNOSIS — E8940 Asymptomatic postprocedural ovarian failure: Secondary | ICD-10-CM

## 2018-11-09 HISTORY — DX: Asymptomatic postprocedural ovarian failure: E89.40

## 2018-11-16 LAB — IGP, APTIMA HPV: HPV Aptima: NEGATIVE

## 2018-11-18 DIAGNOSIS — Z01818 Encounter for other preprocedural examination: Secondary | ICD-10-CM | POA: Diagnosis not present

## 2018-11-18 DIAGNOSIS — Z452 Encounter for adjustment and management of vascular access device: Secondary | ICD-10-CM | POA: Diagnosis not present

## 2018-11-18 DIAGNOSIS — Z170 Estrogen receptor positive status [ER+]: Secondary | ICD-10-CM | POA: Diagnosis not present

## 2018-11-18 DIAGNOSIS — Z6841 Body Mass Index (BMI) 40.0 and over, adult: Secondary | ICD-10-CM | POA: Diagnosis not present

## 2018-11-18 DIAGNOSIS — C50811 Malignant neoplasm of overlapping sites of right female breast: Secondary | ICD-10-CM | POA: Diagnosis not present

## 2018-11-19 LAB — VISTASEQ HERED CANCER W/O BRCA: Result Summary: NEGATIVE

## 2018-11-21 DIAGNOSIS — Z1159 Encounter for screening for other viral diseases: Secondary | ICD-10-CM | POA: Diagnosis not present

## 2018-11-21 DIAGNOSIS — Z01812 Encounter for preprocedural laboratory examination: Secondary | ICD-10-CM | POA: Diagnosis not present

## 2018-11-22 ENCOUNTER — Encounter: Payer: Self-pay | Admitting: Obstetrics and Gynecology

## 2018-11-22 DIAGNOSIS — Z853 Personal history of malignant neoplasm of breast: Secondary | ICD-10-CM | POA: Diagnosis not present

## 2018-11-22 DIAGNOSIS — I110 Hypertensive heart disease with heart failure: Secondary | ICD-10-CM | POA: Diagnosis not present

## 2018-11-22 DIAGNOSIS — I509 Heart failure, unspecified: Secondary | ICD-10-CM | POA: Diagnosis not present

## 2018-11-22 DIAGNOSIS — Z6841 Body Mass Index (BMI) 40.0 and over, adult: Secondary | ICD-10-CM | POA: Diagnosis not present

## 2018-11-22 DIAGNOSIS — Z803 Family history of malignant neoplasm of breast: Secondary | ICD-10-CM | POA: Diagnosis not present

## 2018-11-22 DIAGNOSIS — Z4009 Encounter for prophylactic removal of other organ: Secondary | ICD-10-CM | POA: Diagnosis not present

## 2018-11-22 DIAGNOSIS — N800 Endometriosis of uterus: Secondary | ICD-10-CM | POA: Diagnosis not present

## 2018-11-22 DIAGNOSIS — Z4002 Encounter for prophylactic removal of ovary: Secondary | ICD-10-CM | POA: Diagnosis not present

## 2018-11-22 DIAGNOSIS — Z8249 Family history of ischemic heart disease and other diseases of the circulatory system: Secondary | ICD-10-CM | POA: Diagnosis not present

## 2018-11-22 DIAGNOSIS — Z79899 Other long term (current) drug therapy: Secondary | ICD-10-CM | POA: Diagnosis not present

## 2018-11-22 DIAGNOSIS — Z170 Estrogen receptor positive status [ER+]: Secondary | ICD-10-CM | POA: Diagnosis not present

## 2018-11-22 DIAGNOSIS — C50811 Malignant neoplasm of overlapping sites of right female breast: Secondary | ICD-10-CM | POA: Diagnosis not present

## 2018-11-28 HISTORY — PX: ABDOMINAL HYSTERECTOMY: SHX81

## 2018-12-21 ENCOUNTER — Encounter: Payer: Self-pay | Admitting: Family Medicine

## 2018-12-21 ENCOUNTER — Other Ambulatory Visit: Payer: Self-pay

## 2018-12-21 ENCOUNTER — Ambulatory Visit (INDEPENDENT_AMBULATORY_CARE_PROVIDER_SITE_OTHER): Payer: Medicaid Other | Admitting: Family Medicine

## 2018-12-21 VITALS — BP 112/68 | HR 89 | Temp 97.8°F | Ht 65.0 in | Wt 288.0 lb

## 2018-12-21 DIAGNOSIS — K219 Gastro-esophageal reflux disease without esophagitis: Secondary | ICD-10-CM | POA: Diagnosis not present

## 2018-12-21 DIAGNOSIS — I890 Lymphedema, not elsewhere classified: Secondary | ICD-10-CM

## 2018-12-21 DIAGNOSIS — I10 Essential (primary) hypertension: Secondary | ICD-10-CM | POA: Diagnosis not present

## 2018-12-21 DIAGNOSIS — R200 Anesthesia of skin: Secondary | ICD-10-CM | POA: Diagnosis not present

## 2018-12-21 DIAGNOSIS — Z170 Estrogen receptor positive status [ER+]: Secondary | ICD-10-CM

## 2018-12-21 DIAGNOSIS — E782 Mixed hyperlipidemia: Secondary | ICD-10-CM | POA: Diagnosis not present

## 2018-12-21 DIAGNOSIS — C50411 Malignant neoplasm of upper-outer quadrant of right female breast: Secondary | ICD-10-CM | POA: Diagnosis not present

## 2018-12-21 NOTE — Progress Notes (Signed)
Name: Mckenzie Lopez   MRN: [id number]    DOB: [birth year]   Date:12/21/2018       Progress Note  Subjective  Chief Complaint  Chief Complaint  Patient presents with  . Follow-up    3 month follow up    I connected with  [name]  on 12/21/18 at 10:00 AM EDT by a video enabled telemedicine application and verified that I am speaking with the correct person using two identifiers.  I discussed the limitations of evaluation and management by telemedicine and the availability of in person appointments. The patient expressed understanding and agreed to proceed. Staff also discussed with the patient that there may be a patient responsible charge related to this service. Patient Location: Home Provider Location: Home Additional Individuals present: None  HPI  Hypertension: rx amlodipine '5mg'$  daily, and hydralazine '50mg'$  TID and labetalol '200mg'$  TID. States she has been on this regimen since she had her daughter due to hypertensive crisis. Her blood pressure has been well controlled on this regimen and she has no issues with this.  127/77, 112/68 today. Denies chest pain, shortness of breath, BLE edema, headaches, lightheadedness or dizziness.  GERD: Takes famotidine at night time PRN, takes it as needed maybe every other day at the maximum. Doing well on this regimen, no difficulty swallowing, blood in stool, or abdominal pain.   Hyperlipidemia:  Not presently on any medications for cholesterol. She has not family history of MI/CVA at age younger than 35yo.  Last check was February 2020.   Breast Cancer- right breast with residual chemotherapy/Radiation SE's:  Has completed radiation and chemotherapy.  States last scans did not show metatstasis. She is presently taking femara and pentoxyfylline.  She does note lymphadema in the RIGHT arm and some numbness in her bilateral hands.  She was referred to a neurologist on 01/20/2019 for the numbness.  Has a sleeve for her swelling and  was seeing a PT, but insurance stopped covering, so she is doing home exercises. Has lymphedema pump BID.  She is working on getting disability at this time due to her hand numbness.  Morbid obesity/IFG:  Had tried saxenda for 4 months with some improvement. States she was getting a lot of steroids when doing chemo and gained weight. She is trying to walk in her neighborhood 3 days a week for at least 30 minutes. Discussed dietary changes in great detail. Last A1C was normal.  Denies polyuria, polyphagia, or polyuria.  She has taken [place] and wold like to restart, will check with oncology first and let me know. Oral medications caused palpitations and chest tightness in the past.  No family history of thyroid cancer or personal hx thyroid cancer or pancreatitis.  Patient Active Problem List   Diagnosis Date Noted  . Malignant neoplasm of upper-outer quadrant of right breast in female, estrogen receptor positive ([place]) 12/07/2017  . Allergic conjunctivitis of both eyes 09/16/2017  . Family history of breast cancer 10/13/2016  . Dysplasia of cervix, low grade (CIN 1) 10/13/2016  . Thrombocytosis ([place]) 03/16/2016  . Hemoglobin C trait ([place]) 11/29/2015  . Hematuria 02/21/2015  . Vitamin D deficiency 02/21/2015  . Sterilization consult 02/21/2015  . Low back pain   . Morbid obesity ([place])   . Hyperlipidemia   . Hypertension   . IFG (impaired fasting glucose)   . Migraines     Past Surgical History:  Procedure Laterality Date  . AXILLARY LYMPH NODE BIOPSY Right 12/01/2017   METASTATIC  MAMMARY CARCINOMA  . AXILLARY LYMPH NODE BIOPSY Left 12/01/2017   NEGATIVE FOR MALIGNANCY  . BREAST BIOPSY Right 12/01/2017   11 and 12 o'clock, INVASIVE MAMMARY CARCINOMA, ER/PR positive HER2 negative  . MASTECTOMY Right 01/05/2018    Family History  Problem Relation Age of Onset  . Diabetes Mother   . Hypertension Mother   . Thyroid disease Mother   . Breast cancer Maternal Grandmother        ?age   . Diabetes Maternal Grandfather   . Hypertension Maternal Grandfather   . Seizures Maternal Grandfather   . Breast cancer Maternal Aunt        early 21's  . Diabetes Sister   . Diabetes Paternal Grandmother   . Heart disease Neg Hx   . Stroke Neg Hx   . COPD Neg Hx   . Ovarian cancer Neg Hx     Social History   Socioeconomic History  . Marital status: Single    Spouse name: Not on file  . Number of children: Not on file  . Years of education: Not on file  . Highest education level: Not on file  Occupational History  . Not on file  Social Needs  . Financial resource strain: Not on file  . Food insecurity    Worry: Not on file    Inability: Not on file  . Transportation needs    Medical: Not on file    Non-medical: Not on file  Tobacco Use  . Smoking status: Never Smoker  . Smokeless tobacco: Never Used  Substance and Sexual Activity  . Alcohol use: No  . Drug use: No  . Sexual activity: Not Currently    Birth control/protection: None  Lifestyle  . Physical activity    Days per week: Not on file    Minutes per session: Not on file  . Stress: Not on file  Relationships  . Social Herbalist on phone: Not on file    Gets together: Not on file    Attends religious service: Not on file    Active member of club or organization: Not on file    Attends meetings of clubs or organizations: Not on file    Relationship status: Not on file  . Intimate partner violence    Fear of current or ex partner: Not on file    Emotionally abused: Not on file    Physically abused: Not on file    Forced sexual activity: Not on file  Other Topics Concern  . Not on file  Social History Narrative  . Not on file     Current Outpatient Medications:  .  amLODipine (NORVASC) 5 MG tablet, Take 1 tablet (5 mg total) by mouth daily., Disp: 90 tablet, Rfl: 3 .  Blood Pressure Monitoring (BLOOD PRESSURE MONITOR/L CUFF) MISC, Large cuff; check BP; fluctuating blood pressures; HTN;  LON 99 months, Disp: 1 each, Rfl: 0 .  famotidine (PEPCID) 40 MG tablet, Take 40 mg by mouth as needed. , Disp: , Rfl:  .  fluticasone (FLONASE) 50 MCG/ACT nasal spray, Place 2 sprays into both nostrils daily as needed. (Patient taking differently: Place 2 sprays into both nostrils daily as needed for allergies. ), Disp: 48 g, Rfl: 3 .  gabapentin (NEURONTIN) 100 MG capsule, , Disp: , Rfl:  .  hydrALAZINE (APRESOLINE) 50 MG tablet, Take 1 tablet (50 mg total) by mouth 3 (three) times daily., Disp: 270 tablet, Rfl: 2 .  labetalol (NORMODYNE)  200 MG tablet, TAKE 1 TABLET BY MOUTH 3  TIMES DAILY, Disp: 270 tablet, Rfl: 1 .  letrozole (FEMARA) 2.5 MG tablet, Take 2.5 mg by mouth daily. , Disp: , Rfl:  .  Olopatadine HCl 0.2 % SOLN, One drop in each eye daily (Patient taking differently: Place 1 drop into both eyes daily as needed (for allergy eyes). ), Disp: 2.5 mL, Rfl: 11 .  pentoxifylline (TRENTAL) 400 MG CR tablet, Take 400 mg by mouth 3 (three) times daily with meals. , Disp: , Rfl:  .  Vitamin D, Ergocalciferol, (DRISDOL) 1.25 MG (50000 UT) CAPS capsule, Take 50,000 Units by mouth every 7 (seven) days. , Disp: , Rfl:  .  Cholecalciferol (VITAMIN D) 2000 UNITS CAPS, Take 2,000 Units by mouth daily., Disp: , Rfl:  .  hydrocortisone valerate cream (WESTCORT) 0.2 %, Apply 1 application topically as needed (itching). , Disp: , Rfl:   Allergies  Allergen Reactions  . Chlorthalidone Other (See Comments)    Severe hypokalemia    I personally reviewed active problem list, medication list, allergies, notes from last encounter, lab results with the patient/caregiver today.   ROS  Constitutional: Negative for fever or weight change.  Respiratory: Negative for cough and shortness of breath.   Cardiovascular: Negative for chest pain or palpitations.  Gastrointestinal: Negative for abdominal pain, no bowel changes.  Musculoskeletal: Negative for gait problem or joint swelling.  Skin: Negative for  rash.  Neurological: Negative for dizziness or headache.  No other specific complaints in a complete review of systems (except as listed in HPI above).  Objective  Virtual encounter, vitals not obtained.  Body mass index is 47.93 kg/m.  Physical Exam Constitutional: Patient appears well-developed and well-nourished. No distress.  HENT: Head: Normocephalic and atraumatic.  Neck: Normal range of motion. Pulmonary/Chest: Effort normal. No respiratory distress. Speaking in complete sentences Neurological: Pt is alert and oriented to person, place, and time. Coordination, speech are normal.  Psychiatric: Patient has a normal mood and affect. behavior is normal. Judgment and thought content normal.  No results found for this or any previous visit (from the past 72 hour(s)).  PHQ2/9: Depression screen [facility] 2/9 12/21/2018 09/20/2018 06/22/2018 12/20/2017 09/16/2017  Decreased Interest 0 0 0 0 0  Down, Depressed, Hopeless 0 0 0 0 0  PHQ - 2 Score 0 0 0 0 0  Altered sleeping 0 0 0 - -  Tired, decreased energy 0 0 0 - -  Change in appetite 0 0 0 - -  Feeling bad or failure about yourself  0 0 0 - -  Trouble concentrating 0 0 0 - -  Moving slowly or fidgety/restless 0 0 0 - -  Suicidal thoughts 0 0 0 - -  PHQ-9 Score 0 0 0 - -  Difficult doing work/chores Not difficult at all Not difficult at all Not difficult at all - -   PHQ-2/9 Result is negative.    Fall Risk: Fall Risk  12/21/2018 09/20/2018 06/22/2018 12/20/2017 09/16/2017  Falls in the past year? 0 0 0 No No  Number falls in past yr: 0 - - - -  Injury with Fall? 0 - - - -     Assessment & Plan  1. Malignant neoplasm of upper-outer quadrant of right breast in female, estrogen receptor positive ([place]) - Seeing oncology  2. Lymphedema of arm - Lengthy discussion regarding her treatment, she is doing okay with home therapy with the help of her mom at this time.  3. Bilateral hand numbness - Seeing neurosurgery in 1 month.  4.  Essential hypertension - Stable, doing well on current regimen so we will maintain.  5. GERD without esophagitis - Stable on famotidine PRN, avoiding triggering foods.  6. Mixed hyperlipidemia - Discussed diet in great detail, she will read through her AVS on Mychart as well.  7. Morbid obesity ([place]) - She will talk with her oncologist, if [facility], will proceed with Saxenda Rx.  She is walking and will start really working on her diet. - Discussed importance of 150 minutes of physical activity weekly, eat two servings of fish weekly, eat one serving of tree nuts ( cashews, pistachios, pecans, almonds.Marland Kitchen) every other day, eat 6 servings of fruit/vegetables daily and drink plenty of water and avoid sweet beverages.    I discussed the assessment and treatment plan with the patient. The patient was provided an opportunity to ask questions and all were answered. The patient agreed with the plan and demonstrated an understanding of the instructions.  The patient was advised to call back or seek an in-person evaluation if the symptoms worsen or if the condition fails to improve as anticipated.  I provided 19 minutes of non-face-to-face time during this encounter.

## 2018-12-21 NOTE — Patient Instructions (Signed)
Your LDL is above normal.  The LDL is the "lousy" or bad cholesterol. Over time and in combination with inflammation and other factors, this contributes to plaque which in turn may lead to stroke and/or heart attack down the road.  Sometimes high LDL is primarily genetic, and people might be eating all the right foods but still have high numbers.  Other times, there is room for improvement in one's diet and eating healthier can bring this number down and potentially reduce one's risk of heart attack and/or stroke.  Your LDL level should be below 100. If you have diabetes or a possible heart problem, your LDL should be below 70.  Some strategies to focus on to help improve your LDL levels:  - Eat 20 to 30 grams of fiber every day.  - Eat Foods such as fruits and vegetables, whole grains, beans, peas, nuts, and seeds can help lower LDL. - Avoid Saturated fats - Dairy foods - such as butter, cream, ghee, regular-fat milk and cheese. Meat - such as fatty cuts of beef, pork and lamb, processed meats like salami, sausages and the skin on chicken. Lard., fatty snack foods, cakes, biscuits, pies and deep fried foods) - Avoid smoking    Fat and Cholesterol Restricted Eating Plan Getting too much fat and cholesterol in your diet may cause health problems. Choosing the right foods helps keep your fat and cholesterol at normal levels. This can keep you from getting certain diseases. Your doctor may recommend an eating plan that includes:  Total fat: ______% or less of total calories a day.  Saturated fat: ______% or less of total calories a day.  Cholesterol: less than _________mg a day.  Fiber: ______g a day. What are tips for following this plan? Meal planning  At meals, divide your plate into four equal parts: ? Fill one-half of your plate with vegetables and green salads. ? Fill one-fourth of your plate with whole grains. ? Fill one-fourth of your plate with low-fat (lean) protein foods.  Eat  fish that is high in omega-3 fats at least two times a week. This includes mackerel, tuna, sardines, and salmon.  Eat foods that are high in fiber, such as whole grains, beans, apples, broccoli, carrots, peas, and barley. General tips   Work with your doctor to lose weight if you need to.  Avoid: ? Foods with added sugar. ? Fried foods. ? Foods with partially hydrogenated oils.  Limit alcohol intake to no more than 1 drink a day for nonpregnant women and 2 drinks a day for men. One drink equals 12 oz of beer, 5 oz of wine, or 1 oz of hard liquor. Reading food labels  Check food labels for: ? Trans fats. ? Partially hydrogenated oils. ? Saturated fat (g) in each serving. ? Cholesterol (mg) in each serving. ? Fiber (g) in each serving.  Choose foods with healthy fats, such as: ? Monounsaturated fats. ? Polyunsaturated fats. ? Omega-3 fats.  Choose grain products that have whole grains. Look for the word "whole" as the first word in the ingredient list. Cooking  Cook foods using low-fat methods. These include baking, boiling, grilling, and broiling.  Eat more home-cooked foods. Eat at restaurants and buffets less often.  Avoid cooking using saturated fats, such as butter, cream, palm oil, palm kernel oil, and coconut oil. Recommended foods  Fruits  All fresh, canned (in natural juice), or frozen fruits. Vegetables  Fresh or frozen vegetables (raw, steamed, roasted, or grilled). Green salads.  Grains  Whole grains, such as whole wheat or whole grain breads, crackers, cereals, and pasta. Unsweetened oatmeal, bulgur, barley, quinoa, or brown rice. Corn or whole wheat flour tortillas. Meats and other protein foods  Ground beef (85% or leaner), grass-fed beef, or beef trimmed of fat. Skinless chicken or [place]. Ground chicken or [place]. Pork trimmed of fat. All fish and seafood. Egg whites. Dried beans, peas, or lentils. Unsalted nuts or seeds. Unsalted canned beans. Nut  butters without added sugar or oil. Dairy  Low-fat or nonfat dairy products, such as skim or 1% milk, 2% or reduced-fat cheeses, low-fat and fat-free ricotta or cottage cheese, or plain low-fat and nonfat yogurt. Fats and oils  Tub margarine without trans fats. Light or reduced-fat mayonnaise and salad dressings. Avocado. Olive, canola, sesame, or safflower oils. The items listed above may not be a complete list of foods and beverages you can eat. Contact a dietitian for more information. Foods to avoid Fruits  Canned fruit in heavy syrup. Fruit in cream or butter sauce. Fried fruit. Vegetables  Vegetables cooked in cheese, cream, or butter sauce. Fried vegetables. Grains  White bread. White pasta. White rice. Cornbread. Bagels, pastries, and croissants. Crackers and snack foods that contain trans fat and hydrogenated oils. Meats and other protein foods  Fatty cuts of meat. Ribs, chicken wings, bacon, sausage, bologna, salami, chitterlings, fatback, hot dogs, bratwurst, and packaged lunch meats. Liver and organ meats. Whole eggs and egg yolks. Chicken and [place] with skin. Fried meat. Dairy  Whole or 2% milk, cream, half-and-half, and cream cheese. Whole milk cheeses. Whole-fat or sweetened yogurt. Full-fat cheeses. Nondairy creamers and whipped toppings. Processed cheese, cheese spreads, and cheese curds. Beverages  Alcohol. Sugar-sweetened drinks such as sodas, lemonade, and fruit drinks. Fats and oils  Butter, stick margarine, lard, shortening, ghee, or bacon fat. Coconut, palm kernel, and palm oils. Sweets and desserts  Corn syrup, sugars, honey, and molasses. Candy. Jam and jelly. Syrup. Sweetened cereals. Cookies, pies, cakes, donuts, muffins, and ice cream. The items listed above may not be a complete list of foods and beverages you should avoid. Contact a dietitian for more information. Summary  Choosing the right foods helps keep your fat and cholesterol at normal levels.  This can keep you from getting certain diseases.  At meals, fill one-half of your plate with vegetables and green salads.  Eat high-fiber foods, like whole grains, beans, apples, carrots, peas, and barley.  Limit added sugar, saturated fats, alcohol, and fried foods. This information is not intended to replace advice given to you by your health care provider. Make sure you discuss any questions you have with your health care provider. Document Released: 10/27/2011 Document Revised: 12/29/2017 Document Reviewed: 01/12/2017 Elsevier Patient Education  2020 [employer].

## 2018-12-26 ENCOUNTER — Encounter: Payer: Self-pay | Admitting: Family Medicine

## 2018-12-26 DIAGNOSIS — Z780 Asymptomatic menopausal state: Secondary | ICD-10-CM | POA: Diagnosis not present

## 2018-12-26 DIAGNOSIS — Z90722 Acquired absence of ovaries, bilateral: Secondary | ICD-10-CM | POA: Diagnosis not present

## 2018-12-26 DIAGNOSIS — G629 Polyneuropathy, unspecified: Secondary | ICD-10-CM | POA: Diagnosis not present

## 2018-12-26 DIAGNOSIS — Z79899 Other long term (current) drug therapy: Secondary | ICD-10-CM | POA: Diagnosis not present

## 2018-12-26 DIAGNOSIS — C773 Secondary and unspecified malignant neoplasm of axilla and upper limb lymph nodes: Secondary | ICD-10-CM | POA: Diagnosis not present

## 2018-12-26 DIAGNOSIS — C50811 Malignant neoplasm of overlapping sites of right female breast: Secondary | ICD-10-CM | POA: Diagnosis not present

## 2018-12-26 DIAGNOSIS — E559 Vitamin D deficiency, unspecified: Secondary | ICD-10-CM | POA: Diagnosis not present

## 2018-12-26 DIAGNOSIS — Z170 Estrogen receptor positive status [ER+]: Secondary | ICD-10-CM | POA: Diagnosis not present

## 2018-12-26 DIAGNOSIS — Z9011 Acquired absence of right breast and nipple: Secondary | ICD-10-CM | POA: Diagnosis not present

## 2018-12-26 DIAGNOSIS — Z90710 Acquired absence of both cervix and uterus: Secondary | ICD-10-CM | POA: Diagnosis not present

## 2018-12-26 DIAGNOSIS — R7301 Impaired fasting glucose: Secondary | ICD-10-CM

## 2018-12-27 ENCOUNTER — Encounter: Payer: Self-pay | Admitting: Family Medicine

## 2018-12-27 DIAGNOSIS — E8881 Metabolic syndrome: Secondary | ICD-10-CM

## 2018-12-27 DIAGNOSIS — R7301 Impaired fasting glucose: Secondary | ICD-10-CM

## 2018-12-27 DIAGNOSIS — E782 Mixed hyperlipidemia: Secondary | ICD-10-CM

## 2018-12-27 MED ORDER — SAXENDA 18 MG/3ML ~~LOC~~ SOPN: PEN_INJECTOR | SUBCUTANEOUS | 1 refills | Status: AC

## 2018-12-27 MED ORDER — NOVOFINE 32G X 6 MM MISC: 1.0000 | Freq: Every day | 1 refills | Status: DC

## 2018-12-29 DIAGNOSIS — C50811 Malignant neoplasm of overlapping sites of right female breast: Secondary | ICD-10-CM | POA: Diagnosis not present

## 2018-12-29 DIAGNOSIS — Z9011 Acquired absence of right breast and nipple: Secondary | ICD-10-CM | POA: Diagnosis not present

## 2018-12-29 DIAGNOSIS — Z170 Estrogen receptor positive status [ER+]: Secondary | ICD-10-CM | POA: Diagnosis not present

## 2018-12-29 MED ORDER — VICTOZA 18 MG/3ML ~~LOC~~ SOPN: PEN_INJECTOR | SUBCUTANEOUS | 3 refills | Status: DC

## 2018-12-30 ENCOUNTER — Other Ambulatory Visit: Payer: Self-pay | Admitting: Emergency Medicine

## 2018-12-30 DIAGNOSIS — Z90710 Acquired absence of both cervix and uterus: Secondary | ICD-10-CM | POA: Insufficient documentation

## 2018-12-30 DIAGNOSIS — E782 Mixed hyperlipidemia: Secondary | ICD-10-CM

## 2018-12-30 DIAGNOSIS — E8881 Metabolic syndrome: Secondary | ICD-10-CM

## 2018-12-30 DIAGNOSIS — R7301 Impaired fasting glucose: Secondary | ICD-10-CM

## 2018-12-31 ENCOUNTER — Encounter: Payer: Self-pay | Admitting: Occupational Therapy

## 2019-01-05 MED ORDER — TRULICITY 0.75 MG/0.5ML ~~LOC~~ SOAJ: 0.7500 mg | SUBCUTANEOUS | 1 refills | Status: DC

## 2019-01-05 NOTE — Addendum Note (Signed)
Addended by: Hubbard Hartshorn on: 01/05/2019 10:15 AM   Modules accepted: Orders

## 2019-01-11 DIAGNOSIS — C50811 Malignant neoplasm of overlapping sites of right female breast: Secondary | ICD-10-CM | POA: Diagnosis not present

## 2019-01-11 DIAGNOSIS — Z452 Encounter for adjustment and management of vascular access device: Secondary | ICD-10-CM | POA: Diagnosis not present

## 2019-01-11 DIAGNOSIS — Z170 Estrogen receptor positive status [ER+]: Secondary | ICD-10-CM | POA: Diagnosis not present

## 2019-01-12 ENCOUNTER — Other Ambulatory Visit: Payer: Self-pay

## 2019-01-12 ENCOUNTER — Encounter: Payer: Self-pay | Admitting: Occupational Therapy

## 2019-01-12 ENCOUNTER — Ambulatory Visit: Payer: Medicaid Other | Attending: Physician Assistant | Admitting: Occupational Therapy

## 2019-01-12 DIAGNOSIS — M25611 Stiffness of right shoulder, not elsewhere classified: Secondary | ICD-10-CM | POA: Diagnosis not present

## 2019-01-12 DIAGNOSIS — M6281 Muscle weakness (generalized): Secondary | ICD-10-CM | POA: Insufficient documentation

## 2019-01-12 DIAGNOSIS — G8929 Other chronic pain: Secondary | ICD-10-CM | POA: Insufficient documentation

## 2019-01-12 DIAGNOSIS — M5441 Lumbago with sciatica, right side: Secondary | ICD-10-CM | POA: Diagnosis not present

## 2019-01-12 DIAGNOSIS — I972 Postmastectomy lymphedema syndrome: Secondary | ICD-10-CM | POA: Diagnosis not present

## 2019-01-12 DIAGNOSIS — M25511 Pain in right shoulder: Secondary | ICD-10-CM | POA: Diagnosis not present

## 2019-01-12 NOTE — Patient Instructions (Signed)
Pt provided pulley's to do shoulder flexion and ABD - 12-20 reps pain free  2 x day And gentle pect stretch in corner - 8 reps hold 3 sec Scar and soft tissue massage and mobs on R chest 5 min 2-3 x day

## 2019-01-12 NOTE — Therapy (Signed)
[facility] [address] [place], [address] Phone: [phone]   Fax:  [phone]  Occupational Therapy Re eval   Patient Details  Name: Mckenzie Lopez MRN: [id number] Date of Birth: [birth year] Referring Provider (OT): Garlan Fair   Encounter Date: 01/12/2019  OT End of Session - 01/12/19 1129    Visit Number  1    Number of Visits  6    Date for OT Re-Evaluation  02/23/19    OT Start Time  0915    OT Stop Time  1020    OT Time Calculation (min)  65 min    Activity Tolerance  Patient tolerated treatment well    Behavior During Therapy  [facility] for tasks assessed/performed       Past Medical History:  Diagnosis Date  . BRCA negative 2019   BRCA with Dr. Bary Castilla, 7/20 Vistaseq panel neg with Elmo Putt Copland, PA-C  . CHF (congestive heart failure) ([place])    POSTPARTUM 2017/ RESOLVED  . Eczema   . Hemoglobin C trait ([place]) 11/29/2015  . Hyperlipidemia   . Hypertension   . IFG (impaired fasting glucose)   . Intraductal carcinoma of right breast 11/2017   ER/PR+; Her2neu neg; mets to LN  . Low back pain   . Migraines   . Morbid obesity ([place])     Past Surgical History:  Procedure Laterality Date  . AXILLARY LYMPH NODE BIOPSY Right 12/01/2017   METASTATIC MAMMARY CARCINOMA  . AXILLARY LYMPH NODE BIOPSY Left 12/01/2017   NEGATIVE FOR MALIGNANCY  . BREAST BIOPSY Right 12/01/2017   11 and 12 o'clock, INVASIVE MAMMARY CARCINOMA, ER/PR positive HER2 negative  . MASTECTOMY Right 01/05/2018    There were no vitals filed for this visit.  Subjective Assessment - 01/12/19 1124    Subjective   I seen you last time in June - and about time now to get new daytime compression sleeve and glove , my port come out yesterday - but the other thing that bothers me is that it is harder for me to reach over head - it is so tight in the front of my chest ,pect area - tightness and pain    Pertinent History   R  modified radical mastectomy with lymph node dissection; negative margins; 5 of 21 lymph nodes positive for metastatic carcinoma;01/31/18 Chemotherapy Initiated: Dose-dense doxorubicin-cyclophosphamide (ddAC) q2 weeks x 4 cycles followed by weekly paclitaxel (weekly-T).    Patient Stated Goals  I want to keep the lympedema under control     Currently in Pain?  Yes    Pain Score  10-Worst pain ever   Pt report tightness and pain can increase to 10 at times   Pain Location  --   R anterior chest   Pain Orientation  Right    Pain Descriptors / Indicators  Aching;Tightness    Pain Type  Acute pain;Surgical pain    Pain Onset  More than a month ago    Aggravating Factors   Reaching over head         [facility] OT Assessment - 01/12/19 0001      AROM   Right Shoulder Flexion  110 Degrees    Right Shoulder ABduction  80 Degrees      LYMPHEDEMA/ONCOLOGY QUESTIONNAIRE - 01/12/19 1127      Right Upper Extremity Lymphedema   15 cm Proximal to Olecranon Process  49 cm       Reassess measurements circumference -  increase 1 cm at upper arm and 1.6 cm at wrist   Pt show decrease AROM in R shoulder compare to June -and scar adhere - after she finished radiation -had some radiation burns  Done some scar mobs and soft tissue massage and ed pt to do at home  Will order pt replacement sleeve and glove  And need now night time compression too- Tribute sleeve   Done some AAROM in supine using golf club - but then done Pulleys for shoulder flexion and ABD - and pt done better- add to HEP  And ext rotation stretch in corner with pt  Gentle                 OT Education - 01/12/19 1129    Education Details  findings of reassessment - and HEP review and POC    Person(s) Educated  Patient    Methods  Explanation;Demonstration;Handout    Comprehension  Verbalized understanding;Returned demonstration          OT Long Term Goals - 01/12/19 1231      OT LONG TERM GOAL #1   Title  Pt to  show increase AROM in R shoulder to fix hair and reach into cabinets    Baseline  R shoulder flexion 110 , ABD 80 - was earlier this year 158 degrees - tightnes in anterior chest and under arm    Time  4    Period  Weeks    Status  New    Target Date  02/09/19      OT LONG TERM GOAL #2   Title  Pt to be independent in use of new daytime compression and night time compression sleeve to decrease and maintain R UE circumference    Baseline  From June - upper arm and elbow increase 1 cm , and wrist 1.6 cm - need replacement daytime sleeve and glove -and night time compression too    Time  6    Period  Weeks    Status  New    Target Date  02/23/19      OT LONG TERM GOAL #3   Title  Pt scar tissue and soft tissue adhesion on R anterior chest improve for pt to report less tightness in chest with R shoulder AROM    Baseline  Tightness and with some shooting pain that can increase to 101/0 per pt    Time  6    Period  Weeks    Status  New    Target Date  02/23/19            Plan - 01/12/19 1131    Clinical Impression Statement  Pt had 01/05/18 R modified radical mastectomy with lymph node dissection; negative margins; 5 of 21 lymph nodes positive for metastatic carcinoma;01/31/18 Chemotherapy Initiated and then in 2020 had radiation - she was seen by this OT for R UE lymphedema , R shoulder stiffness - pt done great maintaining her lymphedema with daytime [employer] Elvarex soft sleeve and glove, and pump for AM and PM  as well  R shouler AROM - pt was seen the last time in June- pt return for reassessment for new compression sleeve and glove- and increase tightness and stiffness in R shoulder and anterior chest - pt has increase scar tisuse and tightness where she had some radiation burns , and AROM decrease from 158 to shoulder flexion 110 and ABD 80 - pt show increase circumference in R UE at upperarm and  elbow , as well as wrist about 1 to 1.6 cm - recommend at this time night time sleeve - and OT  intervention for soft tissue and scar tissue mobs and massage, ther ex for shoulder AROM  and fitting of new compression garments    OT Occupational Profile and History  Problem Focused Assessment - Including review of records relating to presenting problem    Occupational performance deficits (Please refer to evaluation for details):  IADL's;Work;Leisure;Play    Body Structure / Function / Physical Skills  ADL;Scar mobility;Edema;Sensation;UE functional use;Flexibility;ROM;Pain;Strength    Rehab Potential  Good    Clinical Decision Making  Limited treatment options, no task modification necessary    Comorbidities Affecting Occupational Performance:  May have comorbidities impacting occupational performance   mastectomy , neuropathy from chemo   Modification or Assistance to Complete Evaluation   Min-Moderate modification of tasks or assist with assess necessary to complete eval    OT Frequency  1x / week    OT Duration  6 weeks    OT Treatment/Interventions  Self-care/ADL training;Manual lymph drainage;Patient/family education;Therapeutic exercise;Scar mobilization;DME and/or AE instruction;Passive range of motion;Manual Therapy    Plan  assess progress with scar chest wall , AROM for shoulder    OT Home Exercise Plan  see pt instruction     Consulted and Agree with Plan of Care  Patient       Patient will benefit from skilled therapeutic intervention in order to improve the following deficits and impairments:   Body Structure / Function / Physical Skills: ADL, Scar mobility, Edema, Sensation, UE functional use, Flexibility, ROM, Pain, Strength       Visit Diagnosis: Stiffness of right shoulder, not elsewhere classified - Plan: Ot plan of care cert/re-cert  Postmastectomy lymphedema syndrome - Plan: Ot plan of care cert/re-cert    Problem List Patient Active Problem List   Diagnosis Date Noted  . Lymphedema of arm 12/21/2018  . Bilateral hand numbness 12/21/2018  . GERD without  esophagitis 12/21/2018  . Malignant neoplasm of upper-outer quadrant of right breast in female, estrogen receptor positive ([place]) 12/07/2017  . Allergic conjunctivitis of both eyes 09/16/2017  . Family history of breast cancer 10/13/2016  . Dysplasia of cervix, low grade (CIN 1) 10/13/2016  . Thrombocytosis ([place]) 03/16/2016  . Hemoglobin C trait ([place]) 11/29/2015  . Hematuria 02/21/2015  . Vitamin D deficiency 02/21/2015  . Sterilization consult 02/21/2015  . Low back pain   . Morbid obesity ([place])   . Hyperlipidemia   . Hypertension   . IFG (impaired fasting glucose)   . Migraines     Salimah Martinovich OTR/L,CLT 01/12/2019, 12:40 PM  [facility] [address], [place], [address] Phone: [phone]   Fax:  [phone]  Name: Audra Jisele Price MRN: [id number] Date of Birth: [birth year]

## 2019-01-17 ENCOUNTER — Other Ambulatory Visit: Payer: Self-pay

## 2019-01-17 ENCOUNTER — Ambulatory Visit (INDEPENDENT_AMBULATORY_CARE_PROVIDER_SITE_OTHER): Payer: Medicaid Other

## 2019-01-17 DIAGNOSIS — Z23 Encounter for immunization: Secondary | ICD-10-CM

## 2019-01-20 ENCOUNTER — Encounter: Payer: Self-pay | Admitting: Occupational Therapy

## 2019-01-20 DIAGNOSIS — Z853 Personal history of malignant neoplasm of breast: Secondary | ICD-10-CM | POA: Diagnosis not present

## 2019-01-20 DIAGNOSIS — R200 Anesthesia of skin: Secondary | ICD-10-CM | POA: Diagnosis not present

## 2019-01-20 DIAGNOSIS — R202 Paresthesia of skin: Secondary | ICD-10-CM | POA: Diagnosis not present

## 2019-01-20 DIAGNOSIS — G620 Drug-induced polyneuropathy: Secondary | ICD-10-CM | POA: Diagnosis not present

## 2019-01-20 DIAGNOSIS — T451X5A Adverse effect of antineoplastic and immunosuppressive drugs, initial encounter: Secondary | ICD-10-CM | POA: Diagnosis not present

## 2019-01-20 DIAGNOSIS — G5793 Unspecified mononeuropathy of bilateral lower limbs: Secondary | ICD-10-CM | POA: Diagnosis not present

## 2019-01-23 ENCOUNTER — Ambulatory Visit: Payer: Medicaid Other | Admitting: Occupational Therapy

## 2019-01-24 DIAGNOSIS — T451X5A Adverse effect of antineoplastic and immunosuppressive drugs, initial encounter: Secondary | ICD-10-CM | POA: Diagnosis not present

## 2019-01-24 DIAGNOSIS — G620 Drug-induced polyneuropathy: Secondary | ICD-10-CM | POA: Diagnosis not present

## 2019-01-25 ENCOUNTER — Other Ambulatory Visit: Payer: Self-pay

## 2019-01-25 ENCOUNTER — Ambulatory Visit: Payer: Medicaid Other | Admitting: Occupational Therapy

## 2019-01-25 DIAGNOSIS — M25511 Pain in right shoulder: Secondary | ICD-10-CM | POA: Diagnosis not present

## 2019-01-25 DIAGNOSIS — M25611 Stiffness of right shoulder, not elsewhere classified: Secondary | ICD-10-CM

## 2019-01-25 DIAGNOSIS — M5441 Lumbago with sciatica, right side: Secondary | ICD-10-CM | POA: Diagnosis not present

## 2019-01-25 DIAGNOSIS — G8929 Other chronic pain: Secondary | ICD-10-CM | POA: Diagnosis not present

## 2019-01-25 DIAGNOSIS — I972 Postmastectomy lymphedema syndrome: Secondary | ICD-10-CM | POA: Diagnosis not present

## 2019-01-25 DIAGNOSIS — M6281 Muscle weakness (generalized): Secondary | ICD-10-CM | POA: Diagnosis not present

## 2019-01-25 NOTE — Therapy (Signed)
[facility] [address], [place], [address] Phone: [phone]   Fax:  [phone]  Occupational Therapy Treatment  Patient Details  Name: Mckenzie Lopez MRN: [id number] Date of Birth: January 16, 1986 Referring Provider (OT): Garlan Fair   Encounter Date: 01/25/2019  OT End of Session - 01/25/19 1636    Visit Number  2    Number of Visits  6    Date for OT Re-Evaluation  02/23/19    OT Start Time  1428    OT Stop Time  1530    OT Time Calculation (min)  62 min    Activity Tolerance  Patient tolerated treatment well    Behavior During Therapy  [facility] for tasks assessed/performed       Past Medical History:  Diagnosis Date  . BRCA negative 2019   BRCA with Dr. Bary Castilla, 7/20 Vistaseq panel neg with Elmo Putt Copland, PA-C  . CHF (congestive heart failure) ([place])    POSTPARTUM 2017/ RESOLVED  . Eczema   . Hemoglobin C trait ([place]) 11/29/2015  . Hyperlipidemia   . Hypertension   . IFG (impaired fasting glucose)   . Intraductal carcinoma of right breast 11/2017   ER/PR+; Her2neu neg; mets to LN  . Low back pain   . Migraines   . Morbid obesity ([place])     Past Surgical History:  Procedure Laterality Date  . AXILLARY LYMPH NODE BIOPSY Right 12/01/2017   METASTATIC MAMMARY CARCINOMA  . AXILLARY LYMPH NODE BIOPSY Left 12/01/2017   NEGATIVE FOR MALIGNANCY  . BREAST BIOPSY Right 12/01/2017   11 and 12 o'clock, INVASIVE MAMMARY CARCINOMA, ER/PR positive HER2 negative  . MASTECTOMY Right 01/05/2018    There were no vitals filed for this visit.  Subjective Assessment - 01/25/19 1632    Subjective   I did do my exercises - maybe little better my shoulder ROM - but still feel pull over chest - and waiting for her to call me to get measure for new compression sleeve    Pertinent History   R modified radical mastectomy with lymph node dissection; negative margins; 5 of 21 lymph nodes positive for metastatic  carcinoma;01/31/18 Chemotherapy Initiated: Dose-dense doxorubicin-cyclophosphamide (ddAC) q2 weeks x 4 cycles followed by weekly paclitaxel (weekly-T).    Patient Stated Goals  I want to keep the lympedema under control     Currently in Pain?  Yes    Pain Score  5     Pain Location  Shoulder    Pain Orientation  Right    Pain Descriptors / Indicators  Aching;Tightness    Pain Type  Surgical pain    Pain Onset  More than a month ago         [facility] OT Assessment - 01/25/19 0001      AROM   Right Shoulder Flexion  122 Degrees    Right Shoulder ABduction  90 Degrees      LYMPHEDEMA/ONCOLOGY QUESTIONNAIRE - 01/25/19 1436      Right Upper Extremity Lymphedema   15 cm Proximal to Olecranon Process  48.5 cm    10 cm Proximal to Olecranon Process  45.5 cm    Olecranon Process  34.5 cm    15 cm Proximal to Ulnar Styloid Process  32.5 cm    10 cm Proximal to Ulnar Styloid Process  28 cm    Just Proximal to Ulnar Styloid Process  23.5 cm    Across Hand at [employer]  23 cm    At [facility] of 2nd Digit  8 cm    At [facility] of Thumb  8 cm       Pt circumference decrease compare to last time - pt hard time getting sleeve all the way up with her neuropathy - pt to get remeasure for new compression sleeve and glove  As well as night sleeve  Told her to ask about Juzo slippy gator - to use to increase ease of donning sleeve   Pt AROM improve see flowsheet  But still tight over anterior chest and under arm - since after radiation ended   Scar massage and mobs done by OT  And soft tissue and myofascial stretch under arm - over lats and shoulder ABD stretch  Into axilla  AROM shoulder ABD and horizontal ABD - in sidelying -  And add to HEP   At start of session try lats stretch in quad position but pt gets cramps - change to on table  Pt to do AAROM on table for shoulder flexion and ABD stretch  10 reps hold 5 sec   Was easier to do and feel pull  Followed at home by  Pulley's for shoulder  flexion and ABD 20 reps each  Side lying - shoulder ABD  And horizontal ABD  10 reps each  On Supine - bilateral shoulder flexion 20 reps   Done kinesiotape scar on chest - 2 parallel 20% pull  And across 3 at 100% pull  Pt ed on precautions and for mom to assist  To replace few times at home - but leave off for 24 hrs if red               OT Education - 01/25/19 1636    Education Details  progress and HEP modified    Person(s) Educated  Patient    Methods  Explanation;Demonstration;Handout    Comprehension  Verbalized understanding;Returned demonstration          OT Long Term Goals - 01/12/19 1231      OT LONG TERM GOAL #1   Title  Pt to show increase AROM in R shoulder to fix hair and reach into cabinets    Baseline  R shoulder flexion 110 , ABD 80 - was earlier this year 158 degrees - tightnes in anterior chest and under arm    Time  4    Period  Weeks    Status  New    Target Date  02/09/19      OT LONG TERM GOAL #2   Title  Pt to be independent in use of new daytime compression and night time compression sleeve to decrease and maintain R UE circumference    Baseline  From June - upper arm and elbow increase 1 cm , and wrist 1.6 cm - need replacement daytime sleeve and glove -and night time compression too    Time  6    Period  Weeks    Status  New    Target Date  02/23/19      OT LONG TERM GOAL #3   Title  Pt scar tissue and soft tissue adhesion on R anterior chest improve for pt to report less tightness in chest with R shoulder AROM    Baseline  Tightness and with some shooting pain that can increase to 101/0 per pt    Time  6    Period  Weeks    Status  New  Target Date  02/23/19            Plan - 01/25/19 1637    Clinical Impression Statement  Pt arrive this date with circumference did decrease since last time - await to get measured for new compression sleeve daytime and night time - AROM in shoulder flexion and ABD did imrpove but  limtied by tightness and scar tissue in R chest and under arm - provided soem soft tissue this date, scar mobs and AROM    OT Occupational Profile and History  Problem Focused Assessment - Including review of records relating to presenting problem    Occupational performance deficits (Please refer to evaluation for details):  IADL's;Work;Leisure;Play    Body Structure / Function / Physical Skills  ADL;Scar mobility;Edema;Sensation;UE functional use;Flexibility;ROM;Pain;Strength    Rehab Potential  Good    Clinical Decision Making  Limited treatment options, no task modification necessary    Comorbidities Affecting Occupational Performance:  May have comorbidities impacting occupational performance    Modification or Assistance to Complete Evaluation   Min-Moderate modification of tasks or assist with assess necessary to complete eval    OT Frequency  1x / week    OT Duration  6 weeks    OT Treatment/Interventions  Self-care/ADL training;Manual lymph drainage;Patient/family education;Therapeutic exercise;Scar mobilization;DME and/or AE instruction;Passive range of motion;Manual Therapy    Plan  assess progress with scar chest wall , AROM for shoulder    OT Home Exercise Plan  see pt instruction     Consulted and Agree with Plan of Care  Patient       Patient will benefit from skilled therapeutic intervention in order to improve the following deficits and impairments:   Body Structure / Function / Physical Skills: ADL, Scar mobility, Edema, Sensation, UE functional use, Flexibility, ROM, Pain, Strength       Visit Diagnosis: Postmastectomy lymphedema syndrome  Stiffness of right shoulder, not elsewhere classified    Problem List Patient Active Problem List   Diagnosis Date Noted  . Lymphedema of arm 12/21/2018  . Bilateral hand numbness 12/21/2018  . GERD without esophagitis 12/21/2018  . Malignant neoplasm of upper-outer quadrant of right breast in female, estrogen receptor  positive ([place]) 12/07/2017  . Allergic conjunctivitis of both eyes 09/16/2017  . Family history of breast cancer 10/13/2016  . Dysplasia of cervix, low grade (CIN 1) 10/13/2016  . Thrombocytosis ([place]) 03/16/2016  . Hemoglobin C trait ([place]) 11/29/2015  . Hematuria 02/21/2015  . Vitamin D deficiency 02/21/2015  . Sterilization consult 02/21/2015  . Low back pain   . Morbid obesity ([place])   . Hyperlipidemia   . Hypertension   . IFG (impaired fasting glucose)   . Migraines     Allante Whitmire OTR/L,CLT 01/25/2019, 4:40 PM  [facility] [address], [place], [address] Phone: [phone]   Fax:  [phone]  Name: Mckenzie Lopez MRN: [id number] Date of Birth: Aug 12, 1985

## 2019-01-25 NOTE — Patient Instructions (Signed)
Pt to do AAROM on table for shoulder flexion and ABD stretch  10 reps hold 5 sec  Pulley's for shoulder flexion and ABD 20 reps each  Side lying - shoulder ABD  And horizontal ABD  10 reps each  On Supine - bilateral shoulder flexion 20 reps   Done kinesiotape scar on chest - 2 parallel 20% pull  And across 3 at 100% pull  Pt ed on precautions  To replace few times at home - but leave off for 24 hrs if redness

## 2019-02-02 ENCOUNTER — Other Ambulatory Visit: Payer: Self-pay

## 2019-02-02 ENCOUNTER — Ambulatory Visit: Payer: Medicaid Other | Admitting: Occupational Therapy

## 2019-02-02 DIAGNOSIS — I972 Postmastectomy lymphedema syndrome: Secondary | ICD-10-CM | POA: Diagnosis not present

## 2019-02-02 DIAGNOSIS — G8929 Other chronic pain: Secondary | ICD-10-CM | POA: Diagnosis not present

## 2019-02-02 DIAGNOSIS — M5441 Lumbago with sciatica, right side: Secondary | ICD-10-CM | POA: Diagnosis not present

## 2019-02-02 DIAGNOSIS — M25511 Pain in right shoulder: Secondary | ICD-10-CM | POA: Diagnosis not present

## 2019-02-02 DIAGNOSIS — M25611 Stiffness of right shoulder, not elsewhere classified: Secondary | ICD-10-CM | POA: Diagnosis not present

## 2019-02-02 DIAGNOSIS — M6281 Muscle weakness (generalized): Secondary | ICD-10-CM | POA: Diagnosis not present

## 2019-02-02 NOTE — Therapy (Signed)
[facility] [address], [place], [address] Phone: [phone]   Fax:  [phone]  Occupational Therapy Treatment  Patient Details  Name: Mckenzie Lopez MRN: [id number] Date of Birth: 1985/08/26 Referring Provider (OT): Garlan Fair   Encounter Date: 02/02/2019  OT End of Session - 02/02/19 0950    Visit Number  3    Number of Visits  6    Date for OT Re-Evaluation  02/23/19    OT Start Time  0950    OT Stop Time  1030    OT Time Calculation (min)  40 min    Activity Tolerance  Patient tolerated treatment well    Behavior During Therapy  [facility] for tasks assessed/performed       Past Medical History:  Diagnosis Date  . BRCA negative 2019   BRCA with Dr. Bary Castilla, 7/20 Vistaseq panel neg with Elmo Putt Copland, PA-C  . CHF (congestive heart failure) ([place])    POSTPARTUM 2017/ RESOLVED  . Eczema   . Hemoglobin C trait ([place]) 11/29/2015  . Hyperlipidemia   . Hypertension   . IFG (impaired fasting glucose)   . Intraductal carcinoma of right breast 11/2017   ER/PR+; Her2neu neg; mets to LN  . Low back pain   . Migraines   . Morbid obesity ([place])     Past Surgical History:  Procedure Laterality Date  . AXILLARY LYMPH NODE BIOPSY Right 12/01/2017   METASTATIC MAMMARY CARCINOMA  . AXILLARY LYMPH NODE BIOPSY Left 12/01/2017   NEGATIVE FOR MALIGNANCY  . BREAST BIOPSY Right 12/01/2017   11 and 12 o'clock, INVASIVE MAMMARY CARCINOMA, ER/PR positive HER2 negative  . MASTECTOMY Right 01/05/2018    There were no vitals filed for this visit.  Subjective Assessment - 02/02/19 0957    Subjective   My bones and joints hurting today - my R shoulder doing okay - but still cannot reach up - feels like scar tissue improving but in the shoulder in the front    Pertinent History   R modified radical mastectomy with lymph node dissection; negative margins; 5 of 21 lymph nodes positive for metastatic  carcinoma;01/31/18 Chemotherapy Initiated: Dose-dense doxorubicin-cyclophosphamide (ddAC) q2 weeks x 4 cycles followed by weekly paclitaxel (weekly-T).    Patient Stated Goals  I want to keep the lympedema under control     Currently in Pain?  Yes    Pain Score  8     Pain Location  --   all the bones and joints   Pain Descriptors / Indicators  Aching;Numbness          LYMPHEDEMA/ONCOLOGY QUESTIONNAIRE - 02/02/19 1001      Right Upper Extremity Lymphedema   10 cm Proximal to Olecranon Process  46 cm    Olecranon Process  35 cm    15 cm Proximal to Ulnar Styloid Process  32.3 cm    10 cm Proximal to Ulnar Styloid Process  28 cm    Just Proximal to Ulnar Styloid Process  23.3 cm    Across Hand at [employer]  23 cm    At [place] of 2nd Digit  8 cm    At [facility] of Thumb  8 cm      Upper arm increase since last time- pt await to get measured for her new replacement Jobst elvarex soft sleeve and night time Jubilee sleeve  Also recommend getting slippy gator to increase ease of donning - pt hard time  donning sleeve because of neuropathy in hands   Pt AROM for shoulder increase to 130 flexion , ABD 100   but cont to have weakness and stiffness -will refer pt at this time for PT to eval and Tx -   Done some scar mobs and soft tissue over pect, under arm and chest - with some myofascial stretch in axilla with ABD and Flexion shoulder in supine   Cont with pulleys for shoulder flexion and ABD  And in Supine shoulder flexion ,abd External rotation in supine with hands behind head                    OT Education - 02/02/19 1743    Education Details  Pt to cont with HEP - refer to PT for R shoulder rehab    Person(s) Educated  Patient    Methods  Explanation;Demonstration;Handout    Comprehension  Verbalized understanding;Returned demonstration          OT Long Term Goals - 02/02/19 1746      OT LONG TERM GOAL #1   Title  Pt to show increase AROM in R shoulder to  fix hair and reach into cabinets    Baseline  R shoulder flexion 110 , ABD 80 - was earlier this year 158 degrees - tightnes in anterior chest and under arm - now this date shoulder flexion 130 , ABD 100    Status  Deferred      OT LONG TERM GOAL #2   Title  Pt to be independent in use of new daytime compression and night time compression sleeve to decrease and maintain R UE circumference    Baseline  From June - upper arm and elbow increase 1 cm , and wrist 1.6 cm - need replacement daytime sleeve and glove -and night time compression too    Time  4    Period  Weeks    Status  On-going    Target Date  02/23/19      OT LONG TERM GOAL #3   Title  Pt scar tissue and soft tissue adhesion on R anterior chest improve for pt to report less tightness in chest with R shoulder AROM    Baseline  Tightness and with some shooting pain    Time  4    Period  Weeks    Status  On-going    Target Date  02/23/19            Plan - 02/02/19 0950    Clinical Impression Statement  Pt report she did not hear back from [place] about getting measured for replacement sleeve and Jubilee night sleeve - did send email -pt to contact me when she gets new sleeve - pt showed increase shouder ABD and flexion - but cont to have increase stiffness/weakneds  since radiation - will recommend at this time PT eval for R shoulder rehab    OT Occupational Profile and History  Problem Focused Assessment - Including review of records relating to presenting problem    Occupational performance deficits (Please refer to evaluation for details):  IADL's;Work;Leisure;Play    Body Structure / Function / Physical Skills  ADL;Scar mobility;Edema;Sensation;UE functional use;Flexibility;ROM;Pain;Strength    Rehab Potential  Good    Clinical Decision Making  Limited treatment options, no task modification necessary    Comorbidities Affecting Occupational Performance:  May have comorbidities impacting occupational performance     Modification or Assistance to Complete Evaluation   Min-Moderate modification  of tasks or assist with assess necessary to complete eval    OT Frequency  1x / week    OT Duration  6 weeks    OT Treatment/Interventions  Self-care/ADL training;Manual lymph drainage;Patient/family education;Therapeutic exercise;Scar mobilization;DME and/or AE instruction;Passive range of motion;Manual Therapy    Plan  assess fit for new compression sleeves    OT Home Exercise Plan  see pt instruction     Consulted and Agree with Plan of Care  Patient       Patient will benefit from skilled therapeutic intervention in order to improve the following deficits and impairments:   Body Structure / Function / Physical Skills: ADL, Scar mobility, Edema, Sensation, UE functional use, Flexibility, ROM, Pain, Strength       Visit Diagnosis: Stiffness of right shoulder, not elsewhere classified  Chronic right-sided low back pain with right-sided sciatica  Postmastectomy lymphedema syndrome    Problem List Patient Active Problem List   Diagnosis Date Noted  . Lymphedema of arm 12/21/2018  . Bilateral hand numbness 12/21/2018  . GERD without esophagitis 12/21/2018  . Malignant neoplasm of upper-outer quadrant of right breast in female, estrogen receptor positive ([place]) 12/07/2017  . Allergic conjunctivitis of both eyes 09/16/2017  . Family history of breast cancer 10/13/2016  . Dysplasia of cervix, low grade (CIN 1) 10/13/2016  . Thrombocytosis ([place]) 03/16/2016  . Hemoglobin C trait ([place]) 11/29/2015  . Hematuria 02/21/2015  . Vitamin D deficiency 02/21/2015  . Sterilization consult 02/21/2015  . Low back pain   . Morbid obesity ([place])   . Hyperlipidemia   . Hypertension   . IFG (impaired fasting glucose)   . Migraines     Inocencio Roy OTR/l,CLT 02/02/2019, 5:49 PM  [facility] [address], [place], [address] Phone:  [phone]   Fax:  [phone]  Name: Yvonda Taya Ashbaugh MRN: [id number] Date of Birth: Yuette 27, 1987

## 2019-02-02 NOTE — Patient Instructions (Signed)
Cont with pulleys for shoulder flexion and ABD  And in Supine shoulder flexion ,abd External rotation in supine with hands behind head    Get measured for her replacement sleeve for daytime  And night time Jubilee sleeve   and contact me if she got it

## 2019-02-07 ENCOUNTER — Ambulatory Visit: Payer: Medicaid Other

## 2019-02-07 ENCOUNTER — Other Ambulatory Visit: Payer: Self-pay

## 2019-02-07 DIAGNOSIS — M25511 Pain in right shoulder: Secondary | ICD-10-CM | POA: Diagnosis not present

## 2019-02-07 DIAGNOSIS — M5441 Lumbago with sciatica, right side: Secondary | ICD-10-CM | POA: Diagnosis not present

## 2019-02-07 DIAGNOSIS — G8929 Other chronic pain: Secondary | ICD-10-CM

## 2019-02-07 DIAGNOSIS — I972 Postmastectomy lymphedema syndrome: Secondary | ICD-10-CM | POA: Diagnosis not present

## 2019-02-07 DIAGNOSIS — M6281 Muscle weakness (generalized): Secondary | ICD-10-CM | POA: Diagnosis not present

## 2019-02-07 DIAGNOSIS — M25611 Stiffness of right shoulder, not elsewhere classified: Secondary | ICD-10-CM | POA: Diagnosis not present

## 2019-02-07 NOTE — Therapy (Signed)
[facility] [address], [place], [address] Phone: [phone]   Fax:  [phone]  Physical Therapy Treatment  Patient Details  Name: Mckenzie Lopez MRN: [id number] Date of Birth: 05-Jul-1985 Referring Provider (PT): [name] PA (Oncology)   Encounter Date: 02/07/2019  PT End of Session - 02/07/19 1459    Visit Number  1    Number of Visits  4    Date for PT Re-Evaluation  04/04/19    Authorization Type  [place] Medicaid    Authorization - Visit Number  0    Authorization - Number of Visits  3    PT Start Time  2080    PT Stop Time  1450    PT Time Calculation (min)  63 min    Activity Tolerance  Patient tolerated treatment well    Behavior During Therapy  [facility] for tasks assessed/performed       Past Medical History:  Diagnosis Date  . BRCA negative 2019   BRCA with Dr. Bary Castilla, 7/20 Vistaseq panel neg with Elmo Putt Copland, PA-C  . CHF (congestive heart failure) ([place])    POSTPARTUM 2017/ RESOLVED  . Eczema   . Hemoglobin C trait ([place]) 11/29/2015  . Hyperlipidemia   . Hypertension   . IFG (impaired fasting glucose)   . Intraductal carcinoma of right breast 11/2017   ER/PR+; Her2neu neg; mets to LN  . Low back pain   . Migraines   . Morbid obesity ([place])     Past Surgical History:  Procedure Laterality Date  . AXILLARY LYMPH NODE BIOPSY Right 12/01/2017   METASTATIC MAMMARY CARCINOMA  . AXILLARY LYMPH NODE BIOPSY Left 12/01/2017   NEGATIVE FOR MALIGNANCY  . BREAST BIOPSY Right 12/01/2017   11 and 12 o'clock, INVASIVE MAMMARY CARCINOMA, ER/PR positive HER2 negative  . MASTECTOMY Right 01/05/2018    There were no vitals filed for this visit.  Subjective Assessment - 02/07/19 1350    Subjective  Pt presenting to OPPT at recommednation of [facility]OT who has been seeing pt for lymphedema management of RUE s/p mastectomy, chemotherapy, radiation.    Pertinent History  Pt finished 6 weeks  radiation on Helyn 15 2020, noticing worsening stiffness in shoulder, difficulty with overhead movements. Pt also had mastectomy in August 2019 with mild dysfunction of RUE, but not nearly as bad as after her radiation. Pt noticed beginnings of RUE lymphedema in late September in 2019 when she began chemotherapy. Pt reports difficulty with overhead movement. Pt also reports pain on right side of trunk from upper trap to anterior shoulder, axilla, and right breast area. Pt reports pain is improved after using her lymphedema arm compression machine, which she uses twice daily. Pt is left handed. 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 Pt is modified independent with self care, but LUE dominant. Pt struggles with buttons so typicaly chooses clothing that she doesn't need to fasten.    How long can you sit comfortably?  10-15 minutes (shoulder)    How long can you stand comfortably?  10-15 minutes (also limited by pedal neuropathy)    How long can you walk comfortably?  about 15 minutes    Currently in Pain?  Yes    Pain Score  8     Pain Location  --   gross shoulder area, anterior  shoulder, upper trap, Rt front lateral chest.   Pain Orientation  Right    Pain Descriptors / Indicators  Aching    Pain Onset  More than a month ago   6-12 months   Pain Frequency  Constant    Aggravating Factors   reaching overhead for functional tasks, bathing/dressing self.    Pain Relieving Factors  arm pump machine; warm water in shower    Effect of Pain on Daily Activities  limited capacity and endurance         [facility] PT Assessment - 02/07/19 0001      Assessment   Medical Diagnosis  Right shoulder pain c mobility restrictions    Referring Provider (PT)  [name] PA   Oncology   Onset Date/Surgical Date  01/05/18    Hand Dominance  Left    Next MD Visit  --   none  pertaining to this issue   Prior Therapy  Partially addressed with OT, but OT is more focused on lymphedema      Precautions   Precautions  --   RUE restrictions   Required Braces or Orthoses  --   RUE lymphedma compression sleeve     Balance Screen   Has the patient fallen in the past 6 months  No    Has the patient had a decrease in activity level because of a fear of falling?   No    Is the patient reluctant to leave their home because of a fear of falling?   No      Home Environment   Living Environment  Private residence    Living Arrangements  Children   3yo DTR   Available Help at Discharge  Family   Pt's mother assists at times   Type of [place] to enter    Entrance Stairs-Number of Steps  4    Entrance Stairs-Rails  Can reach both    [place]  One level      Prior Function   Level of Independence  --   modified independent with self care; pt cannot lift daugher   Vocation  On disability    Leisure  TV and laundry, read with DTR      Observation/Other Assessments-Edema    Edema  --   RUE lymphedema, being managed by OT     AROM   Right Shoulder Extension  15 Degrees    Right Shoulder Flexion  112 Degrees    Right Shoulder ABduction  76 Degrees    Left Shoulder Extension  34 Degrees    Left Shoulder Flexion  176 Degrees    Left Shoulder ABduction  152 Degrees      PROM   PROM Assessment Site  Shoulder    Right/Left Shoulder  Right    Right Shoulder Flexion  130 Degrees   cued pt to perform which is better when supine   Right Shoulder ABduction  65 Degrees    Right Shoulder Internal Rotation  50 Degrees   supine RUE in 45 degrees abduction   Right Shoulder External Rotation  15 Degrees   supine RUE in 45 degrees abduction   Right Shoulder Horizontal ABduction  55 Degrees      Flexibility   Soft Tissue Assessment /Muscle Length  yes   Pec minor: Tape from sternal angle to coracoid: 18Lt; 14 Rt      Strength  Testing: Shoulder:  Elevation 5/5 bilat  ABDCT: 5/5 bilat Extension (seated): 5/5 bilat  GHJ Internal Rotation: 5/5 bilat GHJ External rotation: 4-/5 Right (painful); 5/5 left Shoulder flexion: 4/5 Left; 3/5 Right painful (lacks ER to neutral)  Elbow Flexion: 5/5 Left, 5/5 (subjective weakness and pain)  Elbow extension: 5/5 bilat, painful on Right Pronation/supination of forearm: all 5/5 except 4-/5 RUE supination Wirst flexion extension: grossly 5/5 bilat wth minimal Rt flexion ROM limitations, likely 2/2 effusion.      Treatment this date, uncharged: -MFR to pec major just inferior to clavicle x8 minutes -Scapular retraction stretch x3 minutes in supine      PT Education - 02/07/19 1457    Education Details  Educated pt on goals to address scapular hypomobility, GHJ hypomobility, and strength deficits, as all are contributory in differen tways    Person(s) Educated  Patient    Methods  Explanation;Demonstration    Comprehension  Verbalized understanding;Returned demonstration;Verbal cues required;Tactile cues required;Need further instruction       PT Short Term Goals - 02/07/19 1731      PT SHORT TERM GOAL #1   Title  After 4 weeks pt will demonstrate improved ROM in scapular by 2cm.    Baseline  At eval Left 18cm, RIght 14cm    Time  4    Period  Weeks    Status  New    Target Date  03/07/19        PT Long Term Goals - 02/07/19 1734      PT LONG TERM GOAL #1   Title  After 8 weeks pt to demonstrate improved ROM in Right shoulder P/ROM Flexion to 150 degrees, extension to 20 degrees, and external rotation to 60 degrees.    Time  8    Period  Weeks    Status  New    Target Date  04/04/19      PT LONG TERM GOAL #2   Title  After 8 weeks pt will demonstrate 5/5 strength  in Right elbow flexion, shoulder flexion to improve tolerance to daily self care.    Time  8    Period  Weeks    Status  New    Target Date  04/04/19      PT LONG TERM GOAL #3   Title   After 8 weeks pt to report improved tolerance to AMB >30 minutes without onset/exacerbation of Right shoulder pain.    Baseline  15 minutes AMB tolerated.    Time  8    Period  Weeks    Status  New    Target Date  04/04/19      PT LONG TERM GOAL #4   Title  After 8 weeks pt will report ability to pick up her child without exacerbation of pain or concern of dropping.    Baseline  Unable to pick up her 40lb DTR    Time  8    Period  Weeks    Status  New    Target Date  04/04/19            Plan - 02/07/19 1502    Clinical Impression Statement  Pt presenting to OPPT wth on going difficulty in functional tasks and self care, stemming from >69month stiffness and weakness of RUE.    Personal Factors and Comorbidities  Age;Education;Past/Current Experience;Profession    Examination-Activity Limitations  Bathing;Bed Mobility;Carry;Dressing;Hygiene/Grooming;Lift;Locomotion Level    Examination-Participation Restrictions  Cleaning;Laundry;Community Activity    Stability/Clinical Decision Making  Stable/Uncomplicated    Clinical Decision  Making  Moderate    Rehab Potential  Good    PT Frequency  2x / week    PT Duration  8 weeks    PT Treatment/Interventions  ADLs/Self Care Home Management;Functional mobility training;Therapeutic activities;Therapeutic exercise;Patient/family education;Neuromuscular re-education;Joint Manipulations;Scar mobilization;Manual techniques;Passive range of motion    PT Next Visit Plan  HEP development for general shoulder shrengthening    PT Home Exercise Plan  Already performing pulleys, table slides, retractions from OT; Author modified scap retraction to scapulae only, and added in 20-25x high volume slides.    Consulted and Agree with Plan of Care  Patient       Patient will benefit from skilled therapeutic intervention in order to improve the following deficits and impairments:  Abnormal gait, Decreased activity tolerance, Decreased coordination, Decreased  knowledge of use of DME, Decreased endurance, Decreased mobility, Decreased range of motion, Decreased safety awareness, Decreased strength, Difficulty walking, Decreased scar mobility, Hypermobility, Hypomobility, Impaired perceived functional ability, Increased fascial restricitons, Impaired flexibility, Postural dysfunction, Obesity  Visit Diagnosis: Chronic right shoulder pain  Muscle weakness (generalized)     Problem List Patient Active Problem List   Diagnosis Date Noted  . Lymphedema of arm 12/21/2018  . Bilateral hand numbness 12/21/2018  . GERD without esophagitis 12/21/2018  . Malignant neoplasm of upper-outer quadrant of right breast in female, estrogen receptor positive ([place]) 12/07/2017  . Allergic conjunctivitis of both eyes 09/16/2017  . Family history of breast cancer 10/13/2016  . Dysplasia of cervix, low grade (CIN 1) 10/13/2016  . Thrombocytosis ([place]) 03/16/2016  . Hemoglobin C trait ([place]) 11/29/2015  . Hematuria 02/21/2015  . Vitamin D deficiency 02/21/2015  . Sterilization consult 02/21/2015  . Low back pain   . Morbid obesity ([place])   . Hyperlipidemia   . Hypertension   . IFG (impaired fasting glucose)   . Migraines    5:41 PM, 02/07/19 Etta Grandchild, PT, DPT Physical Therapist - [place] [phone] (Office)    Aalyiah Camberos C 02/07/2019, 5:40 PM  [facility] [address] [place], [address] Phone: [phone]   Fax:  [phone]  Name: Mckenzie Lopez MRN: [id number] Date of Birth: [birth year]

## 2019-02-14 ENCOUNTER — Other Ambulatory Visit: Payer: Self-pay

## 2019-02-14 ENCOUNTER — Ambulatory Visit: Payer: Medicaid Other | Attending: Physician Assistant

## 2019-02-14 DIAGNOSIS — G8929 Other chronic pain: Secondary | ICD-10-CM

## 2019-02-14 DIAGNOSIS — M25511 Pain in right shoulder: Secondary | ICD-10-CM | POA: Insufficient documentation

## 2019-02-14 DIAGNOSIS — M25611 Stiffness of right shoulder, not elsewhere classified: Secondary | ICD-10-CM | POA: Diagnosis not present

## 2019-02-14 DIAGNOSIS — M5441 Lumbago with sciatica, right side: Secondary | ICD-10-CM | POA: Insufficient documentation

## 2019-02-14 DIAGNOSIS — M6281 Muscle weakness (generalized): Secondary | ICD-10-CM | POA: Diagnosis not present

## 2019-02-14 NOTE — Therapy (Signed)
[facility] [address], [place], [address] Phone: [phone]   Fax:  [phone]  Physical Therapy Treatment  Patient Details  Name: Mckenzie Lopez MRN: [id number] Date of Birth: May 15, 1985 Referring Provider (PT): [name] PA (Oncology)   Encounter Date: 02/14/2019  PT End of Session - 02/14/19 0958    Visit Number  2    Number of Visits  4    Date for PT Re-Evaluation  04/04/19    Authorization Type  [place] Medicaid    Authorization - Visit Number  1    Authorization - Number of Visits  3    PT Start Time  [phone]    PT Stop Time  1026    PT Time Calculation (min)  40 min    Activity Tolerance  Patient tolerated treatment well;Patient limited by pain    Behavior During Therapy  [facility] for tasks assessed/performed       Past Medical History:  Diagnosis Date  . BRCA negative 2019   BRCA with Dr. Bary Castilla, 7/20 Vistaseq panel neg with Elmo Putt Copland, PA-C  . CHF (congestive heart failure) ([place])    POSTPARTUM 2017/ RESOLVED  . Eczema   . Hemoglobin C trait ([place]) 11/29/2015  . Hyperlipidemia   . Hypertension   . IFG (impaired fasting glucose)   . Intraductal carcinoma of right breast 11/2017   ER/PR+; Her2neu neg; mets to LN  . Low back pain   . Migraines   . Morbid obesity ([place])     Past Surgical History:  Procedure Laterality Date  . AXILLARY LYMPH NODE BIOPSY Right 12/01/2017   METASTATIC MAMMARY CARCINOMA  . AXILLARY LYMPH NODE BIOPSY Left 12/01/2017   NEGATIVE FOR MALIGNANCY  . BREAST BIOPSY Right 12/01/2017   11 and 12 o'clock, INVASIVE MAMMARY CARCINOMA, ER/PR positive HER2 negative  . MASTECTOMY Right 01/05/2018    There were no vitals filed for this visit.  Subjective Assessment - 02/14/19 0950    Subjective  Pt doing ok today. Was in a lot of pain yesterday sharp UT pain on Right that extends to clavical area. Pt reports pain slightly better today.    Pertinent History   Pt finished 6 weeks radiation on [place] 15 2020, noticing worsening stiffness in shoulder, difficulty with overhead movements. Pt also had mastectomy in August 2019 with mild dysfunction of RUE, but not nearly as bad as after her radiation. Pt noticed beginnings of RUE lymphedema in late September in 2019 when she began chemotherapy. Pt reports difficulty with overhead movement. Pt also reports pain on right side of trunk from upper trap to anterior shoulder, axilla, and right breast area. Pt reports pain is improved after using her lymphedema arm compression machine, which she uses twice daily. Pt is left handed. 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 Pt is modified independent with self care, but LUE dominant. Pt struggles with buttons so typicaly chooses clothing that she doesn't need to fasten.    Currently in Pain?  Yes    Pain Score  7     Pain Location  --   Rt UT pain to anterior upper chest.     INTERVENTION THIS DATE  -Flexion Table Slides X20 -Rotation Table Slides X20 -supine half foam roll wand flexion with pvc 10X5SEC stretch -caudal glide mobilization just before end  range abduction (~70 degrees) 2x30sec Grade 4 -Mobilization with movement: abduction movement with caudal glide mobilization using belt 5x30sec Grade 3, 4 -supine posterior glide mobilization grade 4, 5x10sec -sustained posterior glide with External rotation stretch 5x15sec  -sidelying Right shoulder abduction 1x15 -Supine shoulder flexion A/ROM 0-90 degrees with 2 lb free weight -standing scapular elevation with 10lb weight in each arm 1x10       PT Short Term Goals - 02/07/19 1731      PT SHORT TERM GOAL #1   Title  After 4 weeks pt will demonstrate improved ROM in scapular by 2cm.    Baseline  At eval Left 18cm, RIght 14cm    Time  4    Period  Weeks    Status  New     Target Date  03/07/19        PT Long Term Goals - 02/07/19 1734      PT LONG TERM GOAL #1   Title  After 8 weeks pt to demonstrate improved ROM in Right shoulder P/ROM Flexion to 150 degrees, extension to 20 degrees, and external rotation to 60 degrees.    Time  8    Period  Weeks    Status  New    Target Date  04/04/19      PT LONG TERM GOAL #2   Title  After 8 weeks pt will demonstrate 5/5 strength  in Right elbow flexion, shoulder flexion to improve tolerance to daily self care.    Time  8    Period  Weeks    Status  New    Target Date  04/04/19      PT LONG TERM GOAL #3   Title  After 8 weeks pt to report improved tolerance to AMB >30 minutes without onset/exacerbation of Right shoulder pain.    Baseline  15 minutes AMB tolerated.    Time  8    Period  Weeks    Status  New    Target Date  04/04/19      PT LONG TERM GOAL #4   Title  After 8 weeks pt will report ability to pick up her child without exacerbation of pain or concern of dropping.    Baseline  Unable to pick up her 40lb DTR    Time  8    Period  Weeks    Status  New    Target Date  04/04/19            Plan - 02/14/19 0959    Clinical Impression Statement First treatment session, heavy emphasis on stretching and manual therapy to progress ROM restrictions. Also began some new general shoulder strengthening. In general, patient demonstrating good tolerance to therapy session this date, reasonable accommodations are alllowed in-session to allow adequate rest between activities as needed. All interventional executed without any exacerbation of pain or other symptoms. Pt demonstrates focused motivation to fully participate in therapy to the best of ability. Pt is able to progress some strengthening interventions as well as some balance interventions, although strength deficits remain obvious to author, as well as easy fatiguability.  Pt given intermittent multimodal cues to teach best possible form with  exercises. Pt continues to make steady progress toward most goals. No home exercise updates made at this time.    Rehab Potential  Good    PT Frequency  2x / week    PT Duration  8 weeks    PT Treatment/Interventions  ADLs/Self Care Home Management;Functional mobility  training;Therapeutic activities;Therapeutic exercise;Patient/family education;Neuromuscular re-education;Joint Manipulations;Scar mobilization;Manual techniques;Passive range of motion    PT Next Visit Plan  Review strength HEP    PT Home Exercise Plan  Already performing pulleys, table slides, retractions from OT; Author modified scap retraction to scapulae only, and added in 20-25x high volume slides.    Consulted and Agree with Plan of Care  Patient       Patient will benefit from skilled therapeutic intervention in order to improve the following deficits and impairments:  Abnormal gait, Decreased activity tolerance, Decreased coordination, Decreased knowledge of use of DME, Decreased endurance, Decreased mobility, Decreased range of motion, Decreased safety awareness, Decreased strength, Difficulty walking, Decreased scar mobility, Hypermobility, Hypomobility, Impaired perceived functional ability, Increased fascial restricitons, Impaired flexibility, Postural dysfunction, Obesity  Visit Diagnosis: Chronic right shoulder pain  Muscle weakness (generalized)  Stiffness of right shoulder, not elsewhere classified     Problem List Patient Active Problem List   Diagnosis Date Noted  . Lymphedema of arm 12/21/2018  . Bilateral hand numbness 12/21/2018  . GERD without esophagitis 12/21/2018  . Malignant neoplasm of upper-outer quadrant of right breast in female, estrogen receptor positive ([place]) 12/07/2017  . Allergic conjunctivitis of both eyes 09/16/2017  . Family history of breast cancer 10/13/2016  . Dysplasia of cervix, low grade (CIN 1) 10/13/2016  . Thrombocytosis ([place]) 03/16/2016  . Hemoglobin C trait ([place])  11/29/2015  . Hematuria 02/21/2015  . Vitamin D deficiency 02/21/2015  . Sterilization consult 02/21/2015  . Low back pain   . Morbid obesity ([place])   . Hyperlipidemia   . Hypertension   . IFG (impaired fasting glucose)   . Migraines    10:26 AM, 02/14/19 Etta Grandchild, PT, DPT Physical Therapist - [place] [phone] (Office)    , C 02/14/2019, 10:05 AM  [facility] [address] [place], [address] Phone: [phone]   Fax:  [phone]  Name: Mckenzie Lopez MRN: [id number] Date of Birth: [birth year]

## 2019-02-16 ENCOUNTER — Other Ambulatory Visit: Payer: Self-pay

## 2019-02-16 ENCOUNTER — Ambulatory Visit: Payer: Medicaid Other

## 2019-02-16 DIAGNOSIS — G8929 Other chronic pain: Secondary | ICD-10-CM | POA: Diagnosis not present

## 2019-02-16 DIAGNOSIS — M6281 Muscle weakness (generalized): Secondary | ICD-10-CM

## 2019-02-16 DIAGNOSIS — M25611 Stiffness of right shoulder, not elsewhere classified: Secondary | ICD-10-CM

## 2019-02-16 DIAGNOSIS — M25511 Pain in right shoulder: Secondary | ICD-10-CM | POA: Diagnosis not present

## 2019-02-16 DIAGNOSIS — M5441 Lumbago with sciatica, right side: Secondary | ICD-10-CM | POA: Diagnosis not present

## 2019-02-16 NOTE — Therapy (Signed)
[facility] [address], [place], [address] Phone: [phone]   Fax:  [phone]  Physical Therapy Treatment  Patient Details  Name: Mckenzie Lopez MRN: [id number] Date of Birth: [birth year] Referring Provider (PT): [name] PA (Oncology)   Encounter Date: 02/16/2019  PT End of Session - 02/16/19 1122    Visit Number  3    Number of Visits  4    Date for PT Re-Evaluation  04/04/19    Authorization Type  [place] Medicaid    Authorization - Visit Number  2    Authorization - Number of Visits  3    PT Start Time  1117    PT Stop Time  1157    PT Time Calculation (min)  40 min    Activity Tolerance  Patient tolerated treatment well;Patient limited by pain    Behavior During Therapy  [facility] for tasks assessed/performed       Past Medical History:  Diagnosis Date  . BRCA negative 2019   BRCA with Dr. Bary Castilla, 7/20 Vistaseq panel neg with Elmo Putt Copland, PA-C  . CHF (congestive heart failure) ([place])    POSTPARTUM 2017/ RESOLVED  . Eczema   . Hemoglobin C trait ([place]) 11/29/2015  . Hyperlipidemia   . Hypertension   . IFG (impaired fasting glucose)   . Intraductal carcinoma of right breast 11/2017   ER/PR+; Her2neu neg; mets to LN  . Low back pain   . Migraines   . Morbid obesity ([place])     Past Surgical History:  Procedure Laterality Date  . AXILLARY LYMPH NODE BIOPSY Right 12/01/2017   METASTATIC MAMMARY CARCINOMA  . AXILLARY LYMPH NODE BIOPSY Left 12/01/2017   NEGATIVE FOR MALIGNANCY  . BREAST BIOPSY Right 12/01/2017   11 and 12 o'clock, INVASIVE MAMMARY CARCINOMA, ER/PR positive HER2 negative  . MASTECTOMY Right 01/05/2018    There were no vitals filed for this visit.  Subjective Assessment - 02/16/19 1121    Subjective  Pt reports continued pain in Rt upper trap area still. New HEP additions performed with success pt reports stightness in axillary area when performing.    Pertinent  History  Pt finished 6 weeks radiation on [place] 15 2020, noticing worsening stiffness in shoulder, difficulty with overhead movements. Pt also had mastectomy in August 2019 with mild dysfunction of RUE, but not nearly as bad as after her radiation. Pt noticed beginnings of RUE lymphedema in late September in 2019 when she began chemotherapy. Pt reports difficulty with overhead movement. Pt also reports pain on right side of trunk from upper trap to anterior shoulder, axilla, and right breast area. Pt reports pain is improved after using her lymphedema arm compression machine, which she uses twice daily. Pt is left handed. 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 Pt is modified independent with self care, but LUE dominant. Pt struggles with buttons so typicaly chooses clothing that she doesn't need to fasten.    Pain Score  5     Pain Location  --   Right upper trap area   Pain Orientation  Right       INTERVENTION THIS DATE  -Flexion Table Slides 1x20x1sec; 5x20secH -Rotation Table Slides 5x20sec  -MFR to Right UT in supine -Active release to Right UT seated, pt cued for Left rotation x15, left lateral  flexion x15  -Standing scapular elevation 10x3secH  -standing RUE ABD to 80 degrees 1x10; 1x10 c 1lb free weight (cued to avoid moving beyond point of clean movement) -standing RUE flexion (long level) 0-90 degrees 2x10 -standing elbow flexion 1x5 c 2lb; 1x10 c 4lb  -sidelying Right shoulder ER 1x10 c 2lb free weight -cable row 25lb 1x10 (low chin, hands to axillae, elbows straight back)      PT Short Term Goals - 02/07/19 1731      PT SHORT TERM GOAL #1   Title  After 4 weeks pt will demonstrate improved ROM in scapular by 2cm.    Baseline  At eval Left 18cm, RIght 14cm    Time  4    Period  Weeks    Status  New    Target Date  03/07/19        PT  Long Term Goals - 02/07/19 1734      PT LONG TERM GOAL #1   Title  After 8 weeks pt to demonstrate improved ROM in Right shoulder P/ROM Flexion to 150 degrees, extension to 20 degrees, and external rotation to 60 degrees.    Time  8    Period  Weeks    Status  New    Target Date  04/04/19      PT LONG TERM GOAL #2   Title  After 8 weeks pt will demonstrate 5/5 strength  in Right elbow flexion, shoulder flexion to improve tolerance to daily self care.    Time  8    Period  Weeks    Status  New    Target Date  04/04/19      PT LONG TERM GOAL #3   Title  After 8 weeks pt to report improved tolerance to AMB >30 minutes without onset/exacerbation of Right shoulder pain.    Baseline  15 minutes AMB tolerated.    Time  8    Period  Weeks    Status  New    Target Date  04/04/19      PT LONG TERM GOAL #4   Title  After 8 weeks pt will report ability to pick up her child without exacerbation of pain or concern of dropping.    Baseline  Unable to pick up her 40lb DTR    Time  8    Period  Weeks    Status  New    Target Date  04/04/19            Plan - 02/16/19 1123    Clinical Impression Statement  Continued with cxurrent plan of care, pt toleraing well without signficant pain increase, although stretches are uncomfrotable at times. Pt doing well with HEP compliance an performance. Pt conitnues to makle slow steady progress toward goals in general. Assessed and treated Right Upper trap this session for first time, noted significant trigger-points and dystonia, pt reports tissue feeling less restrictive after manual intervention.    Rehab Potential  Good    PT Frequency  2x / week    PT Duration  8 weeks    PT Treatment/Interventions  ADLs/Self Care Home Management;Functional mobility training;Therapeutic activities;Therapeutic exercise;Patient/family education;Neuromuscular re-education;Joint Manipulations;Scar mobilization;Manual techniques;Passive range of motion    PT Next Visit  Plan  Review strength HEP    PT Home Exercise Plan  Already performing pulleys, table slides, retractions from OT; Author modified scap retraction to scapulae only, and added in 20-25x high volume slides.    Consulted and Agree with  Plan of Care  Patient       Patient will benefit from skilled therapeutic intervention in order to improve the following deficits and impairments:  Abnormal gait, Decreased activity tolerance, Decreased coordination, Decreased knowledge of use of DME, Decreased endurance, Decreased mobility, Decreased range of motion, Decreased safety awareness, Decreased strength, Difficulty walking, Decreased scar mobility, Hypermobility, Hypomobility, Impaired perceived functional ability, Increased fascial restricitons, Impaired flexibility, Postural dysfunction, Obesity  Visit Diagnosis: Chronic right shoulder pain  Muscle weakness (generalized)  Stiffness of right shoulder, not elsewhere classified  Chronic right-sided low back pain with right-sided sciatica     Problem List Patient Active Problem List   Diagnosis Date Noted  . Lymphedema of arm 12/21/2018  . Bilateral hand numbness 12/21/2018  . GERD without esophagitis 12/21/2018  . Malignant neoplasm of upper-outer quadrant of right breast in female, estrogen receptor positive ([place]) 12/07/2017  . Allergic conjunctivitis of both eyes 09/16/2017  . Family history of breast cancer 10/13/2016  . Dysplasia of cervix, low grade (CIN 1) 10/13/2016  . Thrombocytosis ([place]) 03/16/2016  . Hemoglobin C trait ([place]) 11/29/2015  . Hematuria 02/21/2015  . Vitamin D deficiency 02/21/2015  . Sterilization consult 02/21/2015  . Low back pain   . Morbid obesity ([place])   . Hyperlipidemia   . Hypertension   . IFG (impaired fasting glucose)   . Migraines    11:47 AM, 02/16/19 Etta Grandchild, PT, DPT Physical Therapist - [place] [phone] (Office)    Jishnu Jenniges C 02/16/2019, 11:26 AM  [facility] [address], [place], [address] Phone: [phone]   Fax:  [phone]  Name: Mckenzie Lopez MRN: [id number] Date of Birth: [birth year]

## 2019-02-21 ENCOUNTER — Ambulatory Visit: Payer: Medicaid Other | Admitting: Physical Therapy

## 2019-02-21 ENCOUNTER — Other Ambulatory Visit: Payer: Self-pay

## 2019-02-21 DIAGNOSIS — M6281 Muscle weakness (generalized): Secondary | ICD-10-CM | POA: Diagnosis not present

## 2019-02-21 DIAGNOSIS — M25611 Stiffness of right shoulder, not elsewhere classified: Secondary | ICD-10-CM | POA: Diagnosis not present

## 2019-02-21 DIAGNOSIS — G8929 Other chronic pain: Secondary | ICD-10-CM | POA: Diagnosis not present

## 2019-02-21 DIAGNOSIS — M5441 Lumbago with sciatica, right side: Secondary | ICD-10-CM | POA: Diagnosis not present

## 2019-02-21 DIAGNOSIS — M25511 Pain in right shoulder: Secondary | ICD-10-CM | POA: Diagnosis not present

## 2019-02-21 NOTE — Therapy (Signed)
[facility] [place] [facility] [address], [place], [address] Phone: [phone]   Fax:  [phone]  Physical Therapy Treatment  Patient Details  Name: Mckenzie Lopez MRN: [id number] Date of Birth: Dec 29, 1985 Referring Provider (PT): [name] PA (Oncology)   Encounter Date: 02/21/2019  PT End of Session - 02/21/19 1251    Visit Number  4    Number of Visits  12    Date for PT Re-Evaluation  04/04/19    Authorization Type  [place] [employer]    Authorization - Visit Number  3    Authorization - Number of Visits  12    PT Start Time  8299    PT Stop Time  1115    PT Time Calculation (min)  45 min    Activity Tolerance  Patient tolerated treatment well;Patient limited by pain    Behavior During Therapy  [facility] for tasks assessed/performed       Past Medical History:  Diagnosis Date  . BRCA negative 2019   BRCA with Dr. Bary Castilla, 7/20 Vistaseq panel neg with Elmo Putt Copland, PA-C  . CHF (congestive heart failure) ([place])    POSTPARTUM 2017/ RESOLVED  . Eczema   . Hemoglobin C trait ([place]) 11/29/2015  . Hyperlipidemia   . Hypertension   . IFG (impaired fasting glucose)   . Intraductal carcinoma of right breast 11/2017   ER/PR+; Her2neu neg; mets to LN  . Low back pain   . Migraines   . Morbid obesity ([place])     Past Surgical History:  Procedure Laterality Date  . AXILLARY LYMPH NODE BIOPSY Right 12/01/2017   METASTATIC MAMMARY CARCINOMA  . AXILLARY LYMPH NODE BIOPSY Left 12/01/2017   NEGATIVE FOR MALIGNANCY  . BREAST BIOPSY Right 12/01/2017   11 and 12 o'clock, INVASIVE MAMMARY CARCINOMA, ER/PR positive HER2 negative  . MASTECTOMY Right 01/05/2018    There were no vitals filed for this visit.  Subjective Assessment - 02/21/19 1242    Subjective  Reports she is completing her HEP, UE feels heavy and difficulty with motion, tension in ant shoulder and chest.    Pertinent History  Pt finished 6 weeks  radiation on [name] 2020, noticing worsening stiffness in shoulder, difficulty with overhead movements. Pt also had mastectomy in August 2019 with mild dysfunction of RUE, but not nearly as bad as after her radiation. Pt noticed beginnings of RUE lymphedema in late September in 2019 when she began chemotherapy. Pt reports difficulty with overhead movement. Pt also reports pain on right side of trunk from upper trap to anterior shoulder, axilla, and right breast area. Pt reports pain is improved after using her lymphedema arm compression machine, which she uses twice daily. Pt is left handed. 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 Pt is modified independent with self care, but LUE dominant. Pt struggles with buttons so typicaly chooses clothing that she doesn't need to fasten.    How long can you sit comfortably?  10-15 minutes (shoulder)       Ther-Ex Seated AAROM ER with can x20 3sec hold with good carry over following demo Seated thoracic ext over half foam with bilat shoulder flex AAROM with pipe x20 3sec hold  UT stretch 30sec hold Levator stretch 30sec hold  Pec doorway stretch 30sec hold Standing cable rows 25# 2x 10 with  cuing for decreased shoulder hike and true scapular retraction Lat pulldown 15# 2x 10 with good carry over of proper technique following demo and cuing  Manual -STM with trigger point release to R UT and pec major/minor with decreased tension following - PROM in all directions, with G3 mob sustained - GHJ traction x10 10sec traction 10sec relax                        PT Education - 02/21/19 1249    Education Details  therex form, posture    Person(s) Educated  Patient    Methods  Explanation;Demonstration;Tactile cues;Verbal cues    Comprehension  Verbalized understanding;Returned demonstration;Verbal cues  required;Tactile cues required       PT Short Term Goals - 02/07/19 1731      PT SHORT TERM GOAL #1   Title  After 4 weeks pt will demonstrate improved ROM in scapular by 2cm.    Baseline  At eval Left 18cm, RIght 14cm    Time  4    Period  Weeks    Status  New    Target Date  03/07/19        PT Long Term Goals - 02/07/19 1734      PT LONG TERM GOAL #1   Title  After 8 weeks pt to demonstrate improved ROM in Right shoulder P/ROM Flexion to 150 degrees, extension to 20 degrees, and external rotation to 60 degrees.    Time  8    Period  Weeks    Status  New    Target Date  04/04/19      PT LONG TERM GOAL #2   Title  After 8 weeks pt will demonstrate 5/5 strength  in Right elbow flexion, shoulder flexion to improve tolerance to daily self care.    Time  8    Period  Weeks    Status  New    Target Date  04/04/19      PT LONG TERM GOAL #3   Title  After 8 weeks pt to report improved tolerance to AMB >30 minutes without onset/exacerbation of Right shoulder pain.    Baseline  15 minutes AMB tolerated.    Time  8    Period  Weeks    Status  New    Target Date  04/04/19      PT LONG TERM GOAL #4   Title  After 8 weeks pt will report ability to pick up her child without exacerbation of pain or concern of dropping.    Baseline  Unable to pick up her 40lb DTR    Time  8    Period  Weeks    Status  New    Target Date  04/04/19            Plan - 02/22/19 1544    Clinical Impression Statement  Continued motion progression with manual and assisted techniques with success. Patient has full overhead motion following which she is pleased with. PT continued education on neutral posture and it's impact on soulder motion with good understanding and carry over of proper therex technique following demo and cuing. PT will continue progression as able.    Personal Factors and Comorbidities  Age;Education;Past/Current Experience;Profession    Examination-Activity Limitations   Bathing;Bed Mobility;Carry;Dressing;Hygiene/Grooming;Lift;Locomotion Level    Examination-Participation Restrictions  Cleaning;Laundry;Community Activity    Stability/Clinical Decision Making  Evolving/Moderate complexity    Clinical Decision Making  Moderate    Rehab  Potential  Good    PT Frequency  2x / week    PT Duration  8 weeks    PT Treatment/Interventions  ADLs/Self Care Home Management;Functional mobility training;Therapeutic activities;Therapeutic exercise;Patient/family education;Neuromuscular re-education;Joint Manipulations;Scar mobilization;Manual techniques;Passive range of motion    PT Next Visit Plan  Review strength HEP    PT Home Exercise Plan  Already performing pulleys, table slides, retractions from OT; Author modified scap retraction to scapulae only, and added in 20-25x high volume slides.    Consulted and Agree with Plan of Care  Patient       Patient will benefit from skilled therapeutic intervention in order to improve the following deficits and impairments:  Abnormal gait, Decreased activity tolerance, Decreased coordination, Decreased knowledge of use of DME, Decreased endurance, Decreased mobility, Decreased range of motion, Decreased safety awareness, Decreased strength, Difficulty walking, Decreased scar mobility, Hypermobility, Hypomobility, Impaired perceived functional ability, Increased fascial restricitons, Impaired flexibility, Postural dysfunction, Obesity  Visit Diagnosis: Chronic right shoulder pain  Muscle weakness (generalized)  Stiffness of right shoulder, not elsewhere classified  Chronic right-sided low back pain with right-sided sciatica     Problem List Patient Active Problem List   Diagnosis Date Noted  . Lymphedema of arm 12/21/2018  . Bilateral hand numbness 12/21/2018  . GERD without esophagitis 12/21/2018  . Malignant neoplasm of upper-outer quadrant of right breast in female, estrogen receptor positive ([place]) 12/07/2017  .  Allergic conjunctivitis of both eyes 09/16/2017  . Family history of breast cancer 10/13/2016  . Dysplasia of cervix, low grade (CIN 1) 10/13/2016  . Thrombocytosis ([place]) 03/16/2016  . Hemoglobin C trait ([place]) 11/29/2015  . Hematuria 02/21/2015  . Vitamin D deficiency 02/21/2015  . Sterilization consult 02/21/2015  . Low back pain   . Morbid obesity ([place])   . Hyperlipidemia   . Hypertension   . IFG (impaired fasting glucose)   . Migraines    Shelton Silvas PT, DPT Shelton Silvas 02/22/2019, 3:47 PM  [place] [facility] [address] [place], [address] Phone: [phone]   Fax:  [phone]  Name: Mckenzie Lopez MRN: [id number] Date of Birth: [birth year]

## 2019-02-23 ENCOUNTER — Other Ambulatory Visit: Payer: Self-pay

## 2019-02-23 ENCOUNTER — Ambulatory Visit: Payer: Medicaid Other | Admitting: Physical Therapy

## 2019-02-23 ENCOUNTER — Encounter: Payer: Self-pay | Admitting: Physical Therapy

## 2019-02-23 DIAGNOSIS — M6281 Muscle weakness (generalized): Secondary | ICD-10-CM | POA: Diagnosis not present

## 2019-02-23 DIAGNOSIS — M25511 Pain in right shoulder: Secondary | ICD-10-CM

## 2019-02-23 DIAGNOSIS — M5441 Lumbago with sciatica, right side: Secondary | ICD-10-CM | POA: Diagnosis not present

## 2019-02-23 DIAGNOSIS — M25611 Stiffness of right shoulder, not elsewhere classified: Secondary | ICD-10-CM

## 2019-02-23 DIAGNOSIS — G8929 Other chronic pain: Secondary | ICD-10-CM | POA: Diagnosis not present

## 2019-02-23 NOTE — Therapy (Signed)
[facility] [address] [place], [address] Phone: [phone]   Fax:  [phone]  Physical Therapy Treatment  Patient Details  Name: Mckenzie Lopez MRN: [id number] Date of Birth: [birth year] Referring Provider (PT): Mckenzie Cherry PA (Oncology)   Encounter Date: 10/Lopez/2020  PT End of Session - 10/Lopez/20 1452    Visit Number  5    Number of Visits  12    Date for PT Re-Evaluation  04/04/19    Authorization - Visit Number  4    Authorization - Number of Visits  12    PT Start Time  0145    PT Stop Time  0230    PT Time Calculation (min)  45 min    Activity Tolerance  Patient tolerated treatment well;Patient limited by pain    Behavior During Therapy  [facility] for tasks assessed/performed       Past Medical History:  Diagnosis Date  . BRCA negative 2019   BRCA with Dr. Bary Lopez, 7/20 Vistaseq panel neg with Mckenzie Lopez  . CHF (congestive heart failure) ([place])    POSTPARTUM 2017/ RESOLVED  . Eczema   . Hemoglobin C trait ([place]) 11/29/2015  . Hyperlipidemia   . Hypertension   . IFG (impaired fasting glucose)   . Intraductal carcinoma of right breast 11/2017   ER/PR+; Her2neu neg; mets to LN  . Low back pain   . Migraines   . Morbid obesity ([place])     Past Surgical History:  Procedure Laterality Date  . AXILLARY LYMPH NODE BIOPSY Right 12/01/2017   METASTATIC MAMMARY CARCINOMA  . AXILLARY LYMPH NODE BIOPSY Left 12/01/2017   NEGATIVE FOR MALIGNANCY  . BREAST BIOPSY Right 12/01/2017   11 and 12 o'clock, INVASIVE MAMMARY CARCINOMA, ER/PR positive HER2 negative  . MASTECTOMY Right 01/05/2018    There were no vitals filed for this visit.  Subjective Assessment - 10/Lopez/20 1349    Subjective  5/10 pain today, reports more tension in armpit area and chest. Says she thinks motion is getting a little better.    Pertinent History  Pt finished 6 weeks radiation on Mckenzie Lopez 2020, noticing  worsening stiffness in shoulder, difficulty with overhead movements. Pt also had mastectomy in August 2019 with mild dysfunction of RUE, but not nearly as bad as after her radiation. Pt noticed beginnings of RUE lymphedema in late September in 2019 when she began chemotherapy. Pt reports difficulty with overhead movement. Pt also reports pain on right side of trunk from upper trap to anterior shoulder, axilla, and right breast area. Pt reports pain is improved after using her lymphedema arm compression machine, which she uses twice daily. Pt is left handed. 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 Pt is modified independent with self care, but LUE dominant. Pt struggles with buttons so typicaly chooses clothing that she doesn't need to fasten.    How long can you sit comfortably?  10-Lopez minutes (shoulder)    How long can you stand comfortably?  10-Lopez minutes (also limited by pedal neuropathy)    How long can you walk comfortably?  about Lopez minutes          Ther-Ex Supine AAROM shoulder flex 2x 12 with 3-5 sec hold Supine AAROM shoulder abd 2x 12 with 3-5 sec hold Seated AAROM ER with can x20  3sec hold with good carry over following demo Shoulder AROM flex 2x 10 (to about 90d), cuing for eccentric lower good carry over Shoulder AROM abd 2x 10 (to about 90d) Lat pulldown 25#32x 10 with good carry over of proper technique following demo and cuing  Manual -STM with trigger point release to R UT, lymphnode incision site, and pec major/minor with decreased tension following - PROM in all directions, with G3 mob sustained - GHJ traction x10 10sec traction 10sec relax                      PT Education - 10/Lopez/20 1451    Education Details  therex form    Person(s) Educated  Patient    Methods  Explanation;Demonstration;Verbal cues    Comprehension   Verbalized understanding;Returned demonstration;Verbal cues required       PT Short Term Goals - 02/07/19 1731      PT SHORT TERM GOAL #1   Title  After 4 weeks pt will demonstrate improved ROM in scapular by 2cm.    Baseline  At eval Left 18cm, RIght 14cm    Time  4    Period  Weeks    Status  New    Target Date  03/07/19        PT Long Term Goals - 02/07/19 1734      PT LONG TERM GOAL #1   Title  After 8 weeks pt to demonstrate improved ROM in Right shoulder P/ROM Flexion to 150 degrees, extension to 20 degrees, and external rotation to 60 degrees.    Time  8    Period  Weeks    Status  New    Target Date  04/04/19      PT LONG TERM GOAL #2   Title  After 8 weeks pt will demonstrate 5/5 strength  in Right elbow flexion, shoulder flexion to improve tolerance to daily self care.    Time  8    Period  Weeks    Status  New    Target Date  04/04/19      PT LONG TERM GOAL #3   Title  After 8 weeks pt to report improved tolerance to AMB >30 minutes without onset/exacerbation of Right shoulder pain.    Baseline  Lopez minutes AMB tolerated.    Time  8    Period  Weeks    Status  New    Target Date  04/04/19      PT LONG TERM GOAL #4   Title  After 8 weeks pt will report ability to pick up her child without exacerbation of pain or concern of dropping.    Baseline  Unable to pick up her 40lb DTR    Time  8    Period  Weeks    Status  New    Target Date  04/04/19            Plan - 10/Lopez/20 1454    Clinical Impression Statement  PT continued progression for motion and strengthening with good success. Patinet able to comply with all cuing for proper technique/form. Patinet responds well to manual techniques with increased motion following. Encouraged continued HEP compliance for maintainence of availible shoulder ROM    Personal Factors and Comorbidities  Age;Education;Past/Current Experience;Profession    Examination-Activity Limitations  Bathing;Bed  Mobility;Carry;Dressing;Hygiene/Grooming;Lift;Locomotion Level    Examination-Participation Restrictions  Cleaning;Laundry;Community Activity    Stability/Clinical Decision Making  Evolving/Moderate complexity    Clinical Decision Making  Moderate    Rehab Potential  Good    PT Frequency  2x / week    PT Duration  8 weeks    PT Treatment/Interventions  ADLs/Self Care Home Management;Functional mobility training;Therapeutic activities;Therapeutic exercise;Patient/family education;Neuromuscular re-education;Joint Manipulations;Scar mobilization;Manual techniques;Passive range of motion    PT Next Visit Plan  Review strength HEP    PT Home Exercise Plan  Already performing pulleys, table slides, retractions from OT; Author modified scap retraction to scapulae only, and added in 20-25x high volume slides.    Consulted and Agree with Plan of Care  Patient       Patient will benefit from skilled therapeutic intervention in order to improve the following deficits and impairments:  Abnormal gait, Decreased activity tolerance, Decreased coordination, Decreased knowledge of use of DME, Decreased endurance, Decreased mobility, Decreased range of motion, Decreased safety awareness, Decreased strength, Difficulty walking, Decreased scar mobility, Hypermobility, Hypomobility, Impaired perceived functional ability, Increased fascial restricitons, Impaired flexibility, Postural dysfunction, Obesity  Visit Diagnosis: Chronic right shoulder pain  Muscle weakness (generalized)  Stiffness of right shoulder, not elsewhere classified     Problem List Patient Active Problem List   Diagnosis Date Noted  . Lymphedema of arm 12/21/2018  . Bilateral hand numbness 12/21/2018  . GERD without esophagitis 12/21/2018  . Malignant neoplasm of upper-outer quadrant of right breast in female, estrogen receptor positive ([place]) 12/07/2017  . Allergic conjunctivitis of both eyes 09/16/2017  . Family history of breast  cancer 10/13/2016  . Dysplasia of cervix, low grade (CIN 1) 10/13/2016  . Thrombocytosis ([place]) 03/16/2016  . Hemoglobin C trait ([place]) 11/29/2015  . Hematuria 02/21/2015  . Vitamin D deficiency 02/21/2015  . Sterilization consult 02/21/2015  . Low back pain   . Morbid obesity ([place])   . Hyperlipidemia   . Hypertension   . IFG (impaired fasting glucose)   . Migraines    Shelton Silvas PT, DPT Shelton Silvas 10/Lopez/2020, 2:56 PM  [facility] [facility] [address], [place], [address] Phone: [phone]   Fax:  [phone]  Name: Mckenzie Lopez MRN: [id number] Date of Birth: [birth year]

## 2019-03-03 DIAGNOSIS — Z170 Estrogen receptor positive status [ER+]: Secondary | ICD-10-CM | POA: Diagnosis not present

## 2019-03-03 DIAGNOSIS — C50811 Malignant neoplasm of overlapping sites of right female breast: Secondary | ICD-10-CM | POA: Diagnosis not present

## 2019-03-07 DIAGNOSIS — G620 Drug-induced polyneuropathy: Secondary | ICD-10-CM | POA: Diagnosis not present

## 2019-03-07 DIAGNOSIS — T451X5A Adverse effect of antineoplastic and immunosuppressive drugs, initial encounter: Secondary | ICD-10-CM | POA: Diagnosis not present

## 2019-03-13 ENCOUNTER — Other Ambulatory Visit: Payer: Self-pay

## 2019-03-14 ENCOUNTER — Encounter: Payer: Self-pay | Admitting: Family Medicine

## 2019-03-14 DIAGNOSIS — I972 Postmastectomy lymphedema syndrome: Secondary | ICD-10-CM | POA: Diagnosis not present

## 2019-03-14 MED ORDER — LABETALOL HCL 200 MG PO TABS: ORAL_TABLET | ORAL | 1 refills | Status: DC

## 2019-04-14 ENCOUNTER — Other Ambulatory Visit: Payer: Self-pay

## 2019-04-14 DIAGNOSIS — Z20822 Contact with and (suspected) exposure to covid-19: Secondary | ICD-10-CM

## 2019-04-15 LAB — NOVEL CORONAVIRUS, NAA: SARS-CoV-2, NAA: NOT DETECTED

## 2019-04-28 ENCOUNTER — Other Ambulatory Visit: Payer: Self-pay | Admitting: Family Medicine

## 2019-04-28 DIAGNOSIS — R7301 Impaired fasting glucose: Secondary | ICD-10-CM

## 2019-04-28 DIAGNOSIS — E782 Mixed hyperlipidemia: Secondary | ICD-10-CM

## 2019-04-28 DIAGNOSIS — E8881 Metabolic syndrome: Secondary | ICD-10-CM

## 2019-05-25 ENCOUNTER — Other Ambulatory Visit: Payer: Self-pay

## 2019-05-25 ENCOUNTER — Encounter: Payer: Self-pay | Admitting: Physical Therapy

## 2019-05-25 ENCOUNTER — Ambulatory Visit: Payer: Medicaid Other | Attending: Physician Assistant | Admitting: Physical Therapy

## 2019-05-25 DIAGNOSIS — I972 Postmastectomy lymphedema syndrome: Secondary | ICD-10-CM | POA: Insufficient documentation

## 2019-05-25 DIAGNOSIS — M25511 Pain in right shoulder: Secondary | ICD-10-CM | POA: Insufficient documentation

## 2019-05-25 DIAGNOSIS — M6281 Muscle weakness (generalized): Secondary | ICD-10-CM | POA: Diagnosis not present

## 2019-05-25 DIAGNOSIS — M25611 Stiffness of right shoulder, not elsewhere classified: Secondary | ICD-10-CM | POA: Diagnosis not present

## 2019-05-25 DIAGNOSIS — R293 Abnormal posture: Secondary | ICD-10-CM | POA: Diagnosis not present

## 2019-05-25 DIAGNOSIS — G8929 Other chronic pain: Secondary | ICD-10-CM | POA: Diagnosis not present

## 2019-05-25 NOTE — Therapy (Cosign Needed Addendum)
[facility] [address], [place], [address] Phone: [phone]   Fax:  [phone]  Physical Therapy Evaluation  Patient Details  Name: Mckenzie Lopez MRN: [id number] Date of Birth: 04/11/1986 Referring Provider (PT): [name] PA (Oncology)   Encounter Date: 05/25/2019  PT End of Session - 05/25/19 1345    Visit Number  1    Number of Visits  17    Date for PT Re-Evaluation  07/21/19    Authorization Type  [place] Medicaid    Authorization - Visit Number  0    Authorization - Number of Visits  3    PT Start Time  1100    PT Stop Time  1200    PT Time Calculation (min)  60 min    Activity Tolerance  Patient tolerated treatment well    Behavior During Therapy  [facility] for tasks assessed/performed       Past Medical History:  Diagnosis Date  . BRCA negative 2019   BRCA with Dr. Bary Castilla, 7/20 Vistaseq panel neg with Elmo Putt Copland, PA-C  . CHF (congestive heart failure) ([place])    POSTPARTUM 2017/ RESOLVED  . Eczema   . Hemoglobin C trait ([place]) 11/29/2015  . Hyperlipidemia   . Hypertension   . IFG (impaired fasting glucose)   . Intraductal carcinoma of right breast 11/2017   ER/PR+; Her2neu neg; mets to LN  . Low back pain   . Migraines   . Morbid obesity ([place])     Past Surgical History:  Procedure Laterality Date  . AXILLARY LYMPH NODE BIOPSY Right 12/01/2017   METASTATIC MAMMARY CARCINOMA  . AXILLARY LYMPH NODE BIOPSY Left 12/01/2017   NEGATIVE FOR MALIGNANCY  . BREAST BIOPSY Right 12/01/2017   11 and 12 o'clock, INVASIVE MAMMARY CARCINOMA, ER/PR positive HER2 negative  . MASTECTOMY Right 01/05/2018      Subjective Assessment - 05/25/19 1339    Pertinent History  Pt is a 34 year old female with hx of breast cancer and complaints of R shldr pain. Pt is s/p mastectomy and lymphectomy on 01/05/2018, chemotherapy, and radiation that brought on the R shoulder pain and stiffness. Pt  reports skin irritation of the anterior pec, axillary region, and posterior shoulder pain. Pt history of periphreal neuropathy with numbess in bilat hands and feet, c/o of tingling present in R axillary and chest region. Pt reports muscle spasm sensation in upper trap, a "pulling sensation" in axillary region down to the Latissimus Dorsi, and a throbbing sensation in anterior pectoral region. Current pain is 7/10, at worst 10/10, at best 5/10. Pt reports stiffness in R shoulder upon waking and increasing pain throughout the day with activity. Aggravating factors include reaching OH or behind the back and immediate onset and 5 mins for pain to decrease. Easing factors include resting. Pt wears compression sleeve from hand to shoulder most of the day and overnight to decrease swelling. Pt struggles with ADLs including cooking, cleaning, dressing, caring for small child (3y/o daughter) and has been unable to function around the house at previous level from radiation. Pt goal is to decrease pain and increase shoulder ROM and strength for ADLs.    Limitations  Lifting;House hold activities    Diagnostic tests  No imaging completed recently    Patient Stated Goals  To complete ADLs    Currently in Pain?  Yes    Pain Score  7     Pain Location  Axilla  Pain Orientation  Right    Pain Descriptors / Indicators  Throbbing;Tightness;Constant;Heaviness;Sharp;Numbness;Tingling;Cramping    Pain Type  Chronic pain    Pain Onset  More than a month ago    Pain Frequency  Constant    Aggravating Factors   reaching overhead for functional tasks, gripping objects, cooking, cleaning, dressing,    Pain Relieving Factors  rest, compression sleeve    Effect of Pain on Daily Activities  inability to perform ADLs limited by pain and strength deficits                   Objective measurements completed on examination: See above findings.     Shoulder Eval  Clear: Cervical AROM - clear  Elbow ROM  ROM  WNL  MMT   FLEX- 5/5  EXT- 5/5  Seated:  AROM RIGHT LEFT  Flexion 90 * 115  ABD 82* 125  IR  At 90 ABD limited WNL  ER At 90 ABD limited WNL  HBB Iliac crest Not tested  [facility] Base of occiput Not tested   MMT RIGHT LEFT  Shoulder Flexion 4/5 5/5  Scap ABD and upward rot-Serratus    Shoulder ABD 4/5 5/5  IR 4/5 hard 5/5  ER 4+/5 5/5    Supine:  PROM RIGHT LEFT  Shoulder Flexion 110 WNL  GH Flexion Not tested WNL  Shoulder ABD 60 WNL  GH ABD Not tested WNL  IR limited WNL  ER limited WNL  HADD painful WNL    PAM: PAM RIGHT LEFT  Inferior wnl   Lateral hypomobile   Anterior Neutral, 90, 120, 160 Not tested   Posterior Neutral, 90, 120, 160 hypomobile   [place] Posterior, Superior, Inferior Not tested   AC Posterior, Superior, Inferior  Not tested     Palpation: On lat in axillary region painful, TTP with grimace from pt with palpation to R lat/teres minor and pec minor near proximal insertion. Trigger points noted throughout UT and ant shoulder musculature   Posture Forward head, rounded shoulders bilat, increased thoracic kyphosis                 Sidelying:  Scapulothoracic Rhythm RIGHT LEFT  Protraction Retraction hypomobile   Elevation Depression Hypomobile    Upward Rotation Downward Rotation hypomobile   Anterior Tilt Posterior Tilt      Prone: AROM/PROM   Extension WNL bilat   MMT R/L  Lower Traps 2-/4+  Rhomboid/Scap retractors 3/4+  I position (scap depression/shoulder ext) 4/4+        Ther-Ex Snow angels in supine 3x 6 1x/day  Doorway pec stretch x30sec 3x/day Table slides x15 2x/day 3 sec hold Education on therex technique, frequency, hold time, rep/set range, and education on muscle lengthening and strengthening needed for restoration of proper length/tension relationship of shoulder and cervical/tspine for neutral posture.      PT Education - 05/25/19 1344    Education Details  PT educated pt on addressing strength and  ROM deficits; HEP recommendations    Person(s) Educated  Patient    Methods  Explanation;Demonstration;Verbal cues;Handout    Comprehension  Verbalized understanding;Returned demonstration;Verbal cues required       PT Short Term Goals - 05/26/19 0901      PT SHORT TERM GOAL #1   Title  In 4 weeks, pt will decrease pain score by 2 point in order to make progress towards demonstrating clinically significant improvement in pain levels.    Baseline  05/25/19 7/10 pain  Time  4    Period  Weeks    Status  New    Target Date  06/22/19      PT SHORT TERM GOAL #2   Title  Pt will be independent with HEP in order to improve strength and balance in order to decrease fall risk and improve function at home and work.    Baseline  05/25/19 HEP given    Time  4    Period  Weeks    Status  New    Target Date  06/22/19        PT Long Term Goals - 05/26/19 0853      PT LONG TERM GOAL #1   Title  In 8 weeks, pt will decrease pain score by 4 points in order to improve functional ADL capability and care for young daughter.    Baseline  05/26/19 7/10 pain    Time  8    Period  Weeks    Status  New    Target Date  07/20/19      PT LONG TERM GOAL #2   Title  In 8 weeks, pt will improve AROM of R shoulder WNL in order to perform personal grooming and household ADLs    Baseline  05/26/19 ER: R occiput IR R PSIS Flexion 90; Abduction 82 both painful    Time  8    Period  Weeks    Status  New    Target Date  07/20/19      PT LONG TERM GOAL #3   Title  In 8 weeks, pt will improve gross R shoulder and periscapular strength to 4+/5 MMT to perform heavy household chores and provide care to young daughter .    Baseline  05/26/19 Flexion/ABD/IR 4/5; lower trapezius 2-/5; scap retractors 3/5; I position 4/5    Time  8    Period  Weeks    Status  New    Target Date  07/20/19      PT LONG TERM GOAL #4   Title  Patient will increase FOTO score to 60 to demonstrate predicted increase in functional  mobility to complete ADLs   Simultaneous filing. User may not have seen previous data.   Baseline  05/26/19 30    Time  8    Period  Weeks    Status  New    Target Date  07/20/19             Plan - 05/25/19 1347    Clinical Impression Statement  Pt is a 34 yo female with history of breast cancer, mastectomy, lymphectomy, radiation, and chemotherapy that lead to R shoulder pain. Pt demonstrates decreased strength with shoulder MMT (FLEX, ABD, IR, ER, EXT), ROM (FLEX, ABD, IR, ER, EXT), scapulothoracic rhythm, gross sensation deficits, hypomobile PAM in posterior and inferior direction, trigger points in pec major and upper trap, scar tissue in R axillary and breast region, numbness and tingling, and pain. Pt limitations include pain, inability to reach overhead and behind the back, inability to horizontally ABD/ADD, and inability to pick up objects. Participation limitations include general activity limited by pain, inability to perform ADLs such as dressing, cooking, cleaning, and caring for her young daughter. Pt demonstrates positive affect and willingness to participate in PT. Pt is a good candidate for PT and shows good rehab potential.    Personal Factors and Comorbidities  Comorbidity 3+;Past/Current Experience;Time since onset of injury/illness/exacerbation;Profession    Examination-Activity Limitations  Caring for Others;Dressing;Carry;Hygiene/Grooming;Bathing  Examination-Participation Restrictions  Cleaning;Laundry;Community Activity    Stability/Clinical Decision Making  Evolving/Moderate complexity    Clinical Decision Making  Moderate    Rehab Potential  Good    PT Frequency  2x / week    PT Treatment/Interventions  ADLs/Self Care Home Management;Aquatic Therapy;Functional mobility training;Therapeutic activities;Therapeutic exercise;Manual techniques;Compression bandaging;Neuromuscular re-education    PT Next Visit Plan  Grip strength, safety questions, mobility, joint mobs,  TrPt relief    PT Home Exercise Plan  doorway pec stretch, supine snow angels, table slides (flex/abd)    Consulted and Agree with Plan of Care  Patient       Patient will benefit from skilled therapeutic intervention in order to improve the following deficits and impairments:  Decreased activity tolerance, Decreased range of motion, Decreased strength, Hypomobility, Impaired sensation, Impaired UE functional use, Pain, Decreased mobility, Decreased scar mobility, Increased edema, Postural dysfunction  Visit Diagnosis: Chronic right shoulder pain  Stiffness of right shoulder, not elsewhere classified  Abnormal posture     Problem List Patient Active Problem List   Diagnosis Date Noted  . Lymphedema of arm 12/21/2018  . Bilateral hand numbness 12/21/2018  . GERD without esophagitis 12/21/2018  . Malignant neoplasm of upper-outer quadrant of right breast in female, estrogen receptor positive ([place]) 12/07/2017  . Allergic conjunctivitis of both eyes 09/16/2017  . Family history of breast cancer 10/13/2016  . Dysplasia of cervix, low grade (CIN 1) 10/13/2016  . Thrombocytosis ([place]) 03/16/2016  . Hemoglobin C trait ([place]) 11/29/2015  . Hematuria 02/21/2015  . Vitamin D deficiency 02/21/2015  . Sterilization consult 02/21/2015  . Low back pain   . Morbid obesity ([place])   . Hyperlipidemia   . Hypertension   . IFG (impaired fasting glucose)   . Migraines    Shelton Silvas PT, DPT Ivin Booty, SPT Shelton Silvas 05/26/2019, 11:28 AM  [facility] [address], [place], [address] Phone: [phone]   Fax:  [phone]  Name: Jodye Chastity Noland MRN: [id number] Date of Birth: [birth year]

## 2019-05-26 ENCOUNTER — Encounter: Payer: Self-pay | Admitting: Physical Therapy

## 2019-05-26 NOTE — Addendum Note (Signed)
Addended by: Shelton Silvas on: 05/26/2019 11:45 AM   Modules accepted: Orders

## 2019-05-30 NOTE — Addendum Note (Signed)
Addended by: Shelton Silvas on: 05/30/2019 09:53 AM   Modules accepted: Orders

## 2019-06-01 ENCOUNTER — Encounter: Payer: Self-pay | Admitting: Physical Therapy

## 2019-06-01 ENCOUNTER — Other Ambulatory Visit: Payer: Self-pay

## 2019-06-01 ENCOUNTER — Ambulatory Visit: Payer: Medicaid Other | Admitting: Physical Therapy

## 2019-06-01 DIAGNOSIS — M25511 Pain in right shoulder: Secondary | ICD-10-CM | POA: Diagnosis not present

## 2019-06-01 DIAGNOSIS — M6281 Muscle weakness (generalized): Secondary | ICD-10-CM | POA: Diagnosis not present

## 2019-06-01 DIAGNOSIS — R293 Abnormal posture: Secondary | ICD-10-CM

## 2019-06-01 DIAGNOSIS — I972 Postmastectomy lymphedema syndrome: Secondary | ICD-10-CM | POA: Diagnosis not present

## 2019-06-01 DIAGNOSIS — M25611 Stiffness of right shoulder, not elsewhere classified: Secondary | ICD-10-CM | POA: Diagnosis not present

## 2019-06-01 DIAGNOSIS — G8929 Other chronic pain: Secondary | ICD-10-CM

## 2019-06-01 NOTE — Therapy (Addendum)
[facility] [address] [place], [address] Phone: [phone]   Fax:  [phone]  Physical Therapy Treatment  Patient Details  Name: Mckenzie Lopez MRN: [id number] Date of Birth: [birth year] Referring Provider (PT): [name] PA (Oncology)   Encounter Date: 06/01/2019  PT End of Session - 06/01/19 1201    Visit Number  2    Number of Visits  17    Date for PT Re-Evaluation  07/21/19    Authorization Type  [place] Medicaid    Authorization - Visit Number  2    Authorization - Number of Visits  3    PT Start Time  3149    PT Stop Time  1155    PT Time Calculation (min)  40 min    Activity Tolerance  Patient tolerated treatment well    Behavior During Therapy  [facility] for tasks assessed/performed       Past Medical History:  Diagnosis Date  . BRCA negative 2019   BRCA with Dr. Bary Castilla, 7/20 Vistaseq panel neg with Elmo Putt Copland, PA-C  . CHF (congestive heart failure) ([place])    POSTPARTUM 2017/ RESOLVED  . Eczema   . Hemoglobin C trait ([place]) 11/29/2015  . Hyperlipidemia   . Hypertension   . IFG (impaired fasting glucose)   . Intraductal carcinoma of right breast 11/2017   ER/PR+; Her2neu neg; mets to LN  . Low back pain   . Migraines   . Morbid obesity ([place])     Past Surgical History:  Procedure Laterality Date  . AXILLARY LYMPH NODE BIOPSY Right 12/01/2017   METASTATIC MAMMARY CARCINOMA  . AXILLARY LYMPH NODE BIOPSY Left 12/01/2017   NEGATIVE FOR MALIGNANCY  . BREAST BIOPSY Right 12/01/2017   11 and 12 o'clock, INVASIVE MAMMARY CARCINOMA, ER/PR positive HER2 negative  . MASTECTOMY Right 01/05/2018    There were no vitals filed for this visit.  Subjective Assessment - 06/01/19 1116    Subjective  Pt reports 5/10 pain today, with more tension in axillary region and chest. Pt HEP exercises are going well.    Pertinent History  Pt is a 34 year old female with hx of breast cancer and  complaints of R shldr pain. Pt is s/p mastectomy and lymphectomy on 01/05/2018, chemotherapy, and radiation that brought on the R shoulder pain and stiffness. Pt reports skin irritation of the anterior pec, axillary region, and posterior shoulder pain. Pt history of periphreal neuropathy with numbess in bilat hands and feet, c/o of tingling present in R axillary and chest region. Pt reports muscle spasm sensation in upper trap, a "pulling sensation" in axillary region down to the Latissimus Dorsi, and a throbbing sensation in anterior pectoral region. Current pain is 7/10, at worst 10/10, at best 5/10. Pt reports stiffness in R shoulder upon waking and increasing pain throughout the day with activity. Aggravating factors include reaching OH or behind the back and immediate onset and 5 mins for pain to decrease. Easing factors include resting. Pt wears compression sleeve from hand to shoulder most of the day and overnight to decrease swelling. Pt struggles with ADLs including cooking, cleaning, dressing, caring for small child (3y/o daughter) and has been unable to function around the house at previous level from radiation. Pt goal is to decrease pain and increase shoulder ROM and strength for ADLs.    Limitations  Lifting;House hold activities    Diagnostic tests  No imaging completed recently    Patient  Stated Goals  To complete ADLs       THEREX -Shoulder flexion with 1# DB x10, 2# 2x10 with cueing to keep back on table and elbows straight with good carryover -Seated scapular retraction with GTB 1x12; too easy for pt move up to black band; 2x12; too easy for pt can move up weight; pt demonstrates good motor coordination -Foam roll slides on the wall 1x8, 2x8 with YTB at wrist; with cueing for pt to keep arms apart; pt struggled with ER motion due to tight pec musculature -Standing shoulder flexion stretch 2x30 sec stretch    MAN THER -PROM in motions FLEX ABD IR ER -contract relax technique for  shoulder FLEX, ER, IR x2                        PT Education - 06/01/19 1158    Education Details  therex form, HEP continuation    Person(s) Educated  Patient    Methods  Explanation;Demonstration;Verbal cues    Comprehension  Verbalized understanding       PT Short Term Goals - 05/26/19 0901      PT SHORT TERM GOAL #1   Title  In 4 weeks, pt will decrease pain score by 2 point in order to make progress towards demonstrating clinically significant improvement in pain levels.    Baseline  05/25/19 7/10 pain    Time  4    Period  Weeks    Status  New    Target Date  06/22/19      PT SHORT TERM GOAL #2   Title  Pt will be independent with HEP in order to improve strength and balance in order to decrease fall risk and improve function at home and work.    Baseline  05/25/19 HEP given    Time  4    Period  Weeks    Status  New    Target Date  06/22/19        PT Long Term Goals - 05/26/19 0853      PT LONG TERM GOAL #1   Title  In 8 weeks, pt will decrease pain score by 4 points in order to improve functional ADL capability and care for young daughter.    Baseline  05/26/19 7/10 pain    Time  8    Period  Weeks    Status  New    Target Date  07/20/19      PT LONG TERM GOAL #2   Title  In 8 weeks, pt will improve AROM of R shoulder WNL in order to perform personal grooming and household ADLs    Baseline  05/26/19 ER: R occiput IR R PSIS Flexion 90; Abduction 82 both painful    Time  8    Period  Weeks    Status  New    Target Date  07/20/19      PT LONG TERM GOAL #3   Title  In 8 weeks, pt will improve gross R shoulder and periscapular strength to 4+/5 MMT to perform heavy household chores and provide care to young daughter .    Baseline  05/26/19 Flexion/ABD/IR 4/5; lower trapezius 2-/5; scap retractors 3/5; I position 4/5    Time  8    Period  Weeks    Status  New    Target Date  07/20/19      PT LONG TERM GOAL #4   Title  Patient will increase  FOTO score to 60 to demonstrate predicted increase in functional mobility to complete ADLs   Simultaneous filing. User may not have seen previous data.   Baseline  05/26/19 30    Time  8    Period  Weeks    Status  New    Target Date  07/20/19            Plan - 06/01/19 1209    Clinical Impression Statement  PT focused on man ther in order to increase functional range and decrease pain. PT incorporated strength and stability therex and pt responded very well to tx. PT provided motor control cues during scapular retractions, and foam roller wall slides and pt responded well. PT will continue progressing strength therex and continue man ther as appropriate.    Personal Factors and Comorbidities  Comorbidity 3+;Past/Current Experience;Time since onset of injury/illness/exacerbation;Profession    Examination-Activity Limitations  Caring for Others;Dressing;Carry;Hygiene/Grooming;Bathing    Examination-Participation Restrictions  Cleaning;Laundry;Community Activity    Stability/Clinical Decision Making  Evolving/Moderate complexity    Clinical Decision Making  Moderate    Rehab Potential  Good    PT Frequency  2x / week    PT Duration  8 weeks    PT Treatment/Interventions  ADLs/Self Care Home Management;Aquatic Therapy;Functional mobility training;Therapeutic activities;Therapeutic exercise;Manual techniques;Compression bandaging;Neuromuscular re-education    PT Next Visit Plan  safety questions, mobility    PT Home Exercise Plan  doorway pec stretch, supine snow angels, table slides (flex/abd)    Consulted and Agree with Plan of Care  Patient       Patient will benefit from skilled therapeutic intervention in order to improve the following deficits and impairments:  Decreased activity tolerance, Decreased range of motion, Decreased strength, Hypomobility, Impaired sensation, Impaired UE functional use, Pain, Decreased mobility, Decreased scar mobility, Increased edema, Postural  dysfunction  Visit Diagnosis: Chronic right shoulder pain  Stiffness of right shoulder, not elsewhere classified  Abnormal posture     Problem List Patient Active Problem List   Diagnosis Date Noted  . Lymphedema of arm 12/21/2018  . Bilateral hand numbness 12/21/2018  . GERD without esophagitis 12/21/2018  . Malignant neoplasm of upper-outer quadrant of right breast in female, estrogen receptor positive ([place]) 12/07/2017  . Allergic conjunctivitis of both eyes 09/16/2017  . Family history of breast cancer 10/13/2016  . Dysplasia of cervix, low grade (CIN 1) 10/13/2016  . Thrombocytosis ([place]) 03/16/2016  . Hemoglobin C trait ([place]) 11/29/2015  . Hematuria 02/21/2015  . Vitamin D deficiency 02/21/2015  . Sterilization consult 02/21/2015  . Low back pain   . Morbid obesity ([place])   . Hyperlipidemia   . Hypertension   . IFG (impaired fasting glucose)   . Migraines     Shelton Silvas PT, DPT Ivin Booty, SPT Shelton Silvas 06/01/2019, 3:52 PM  [facility] [address], [place], [address] Phone: [phone]   Fax:  [phone]  Name: Hinata Amenda Duclos MRN: [id number] Date of Birth: 03-Jan-1986

## 2019-06-06 ENCOUNTER — Other Ambulatory Visit: Payer: Self-pay

## 2019-06-06 ENCOUNTER — Ambulatory Visit: Payer: Medicaid Other | Admitting: Occupational Therapy

## 2019-06-06 ENCOUNTER — Ambulatory Visit: Payer: Medicaid Other | Admitting: Physical Therapy

## 2019-06-06 ENCOUNTER — Encounter: Payer: Self-pay | Admitting: Physical Therapy

## 2019-06-06 DIAGNOSIS — M6281 Muscle weakness (generalized): Secondary | ICD-10-CM | POA: Diagnosis not present

## 2019-06-06 DIAGNOSIS — G8929 Other chronic pain: Secondary | ICD-10-CM

## 2019-06-06 DIAGNOSIS — R293 Abnormal posture: Secondary | ICD-10-CM | POA: Diagnosis not present

## 2019-06-06 DIAGNOSIS — I972 Postmastectomy lymphedema syndrome: Secondary | ICD-10-CM

## 2019-06-06 DIAGNOSIS — M25511 Pain in right shoulder: Secondary | ICD-10-CM | POA: Diagnosis not present

## 2019-06-06 DIAGNOSIS — M25611 Stiffness of right shoulder, not elsewhere classified: Secondary | ICD-10-CM | POA: Diagnosis not present

## 2019-06-06 NOTE — Therapy (Addendum)
[facility] [address], [place], [address] Phone: [phone]   Fax:  [phone]  Physical Therapy Treatment  Patient Details  Name: Mckenzie Lopez MRN: [id number] Date of Birth: 06-16-1985 Referring Provider (PT): [name] PA (Oncology)   Encounter Date: 06/06/2019  PT End of Session - 06/06/19 1219    Visit Number  3    Number of Visits  17    Date for PT Re-Evaluation  07/21/19    Authorization Type  [place] [employer]    Authorization - Visit Number  2    Authorization - Number of Visits  3    PT Start Time  6834    PT Stop Time  1155    PT Time Calculation (min)  40 min    Equipment Utilized During Treatment  Other (comment)   Dry needling   Activity Tolerance  Patient tolerated treatment well    Behavior During Therapy  [facility] for tasks assessed/performed       Past Medical History:  Diagnosis Date  . BRCA negative 2019   BRCA with Dr. Bary Castilla, 7/20 Vistaseq panel neg with Elmo Putt Copland, PA-C  . CHF (congestive heart failure) ([place])    POSTPARTUM 2017/ RESOLVED  . Eczema   . Hemoglobin C trait ([place]) 11/29/2015  . Hyperlipidemia   . Hypertension   . IFG (impaired fasting glucose)   . Intraductal carcinoma of right breast 11/2017   ER/PR+; Her2neu neg; mets to LN  . Low back pain   . Migraines   . Morbid obesity ([place])     Past Surgical History:  Procedure Laterality Date  . AXILLARY LYMPH NODE BIOPSY Right 12/01/2017   METASTATIC MAMMARY CARCINOMA  . AXILLARY LYMPH NODE BIOPSY Left 12/01/2017   NEGATIVE FOR MALIGNANCY  . BREAST BIOPSY Right 12/01/2017   11 and 12 o'clock, INVASIVE MAMMARY CARCINOMA, ER/PR positive HER2 negative  . MASTECTOMY Right 01/05/2018    There were no vitals filed for this visit.  Subjective Assessment - 06/06/19 1118    Subjective  Pt reports some tightness in upper trap. Pt reports 6/10 pain today in upper trap and axillary region. Pt HEP going  well.    Pertinent History  Pt is a 34 year old female with hx of breast cancer and complaints of R shldr pain. Pt is s/p mastectomy and lymphectomy on 01/05/2018, chemotherapy, and radiation that brought on the R shoulder pain and stiffness. Pt reports skin irritation of the anterior pec, axillary region, and posterior shoulder pain. Pt history of periphreal neuropathy with numbess in bilat hands and feet, c/o of tingling present in R axillary and chest region. Pt reports muscle spasm sensation in upper trap, a "pulling sensation" in axillary region down to the Latissimus Dorsi, and a throbbing sensation in anterior pectoral region. Current pain is 7/10, at worst 10/10, at best 5/10. Pt reports stiffness in R shoulder upon waking and increasing pain throughout the day with activity. Aggravating factors include reaching OH or behind the back and immediate onset and 5 mins for pain to decrease. Easing factors include resting. Pt wears compression sleeve from hand to shoulder most of the day and overnight to decrease swelling. Pt struggles with ADLs including cooking, cleaning, dressing, caring for small child (3y/o daughter) and has been unable to function around the house at previous level from radiation. Pt goal is to decrease pain and increase shoulder ROM and strength for ADLs.  THEREX -Flexion over foam roller 2# 1x8, 3# 1x8 good carryover with motion from manual therapy -ABD with 2# 2x8; good carryover for keeping arms flat on table  -Improvement with supine AROM, but pt reports challenge with AROM  MANTHER -Shoulder PROM in all directions ~7 mins -GHJ Grade I/II mobs for reducing pain levels 30 sec bout lateral x3, inferior x3, posterior x2  -Contract relax 3 sec contract with 10 sec relax x3 shoulder flexion, ABD, ER -PT notes improvement with shoulder AROM and PROM in supine and decreased pain reported by pt    Dry Needling   Dry Needling: (2) 22m .30 needles placed along the R UT  to decrease increased muscular spasms and trigger points with the patient positioned in supine. Patient was educated on risks and benefits of therapy and verbally consents to PT.    Pain level at the end of session: 1/10                   PT Education - 06/06/19 1218    Education Details  therex form, trigger point education of risks and benefits    Person(s) Educated  Patient    Methods  Explanation;Demonstration    Comprehension  Verbalized understanding       PT Short Term Goals - 05/26/19 0901      PT SHORT TERM GOAL #1   Title  In 4 weeks, pt will decrease pain score by 2 point in order to make progress towards demonstrating clinically significant improvement in pain levels.    Baseline  05/25/19 7/10 pain    Time  4    Period  Weeks    Status  New    Target Date  06/22/19      PT SHORT TERM GOAL #2   Title  Pt will be independent with HEP in order to improve strength and balance in order to decrease fall risk and improve function at home and work.    Baseline  05/25/19 HEP given    Time  4    Period  Weeks    Status  New    Target Date  06/22/19        PT Long Term Goals - 05/26/19 0853      PT LONG TERM GOAL #1   Title  In 8 weeks, pt will decrease pain score by 4 points in order to improve functional ADL capability and care for young daughter.    Baseline  05/26/19 7/10 pain    Time  8    Period  Weeks    Status  New    Target Date  07/20/19      PT LONG TERM GOAL #2   Title  In 8 weeks, pt will improve AROM of R shoulder WNL in order to perform personal grooming and household ADLs    Baseline  05/26/19 ER: R occiput IR R PSIS Flexion 90; Abduction 82 both painful    Time  8    Period  Weeks    Status  New    Target Date  07/20/19      PT LONG TERM GOAL #3   Title  In 8 weeks, pt will improve gross R shoulder and periscapular strength to 4+/5 MMT to perform heavy household chores and provide care to young daughter .    Baseline  05/26/19  Flexion/ABD/IR 4/5; lower trapezius 2-/5; scap retractors 3/5; I position 4/5    Time  8    Period  Weeks    Status  New    Target Date  07/20/19      PT LONG TERM GOAL #4   Title  Patient will increase FOTO score to 60 to demonstrate predicted increase in functional mobility to complete ADLs   Simultaneous filing. User may not have seen previous data.   Baseline  05/26/19 30    Time  8    Period  Weeks    Status  New    Target Date  07/20/19            Plan - 06/06/19 1220    Clinical Impression Statement  PT focused on manual therapy to increase funtional range with good success. PT performed grade I/II mobs for pain relief and increased motion. Pt responded well to dry needling and reported noticable decrease of tension in the upper trap. PT instructed pt through gentle resisted AROM and pt had good success with exercises. PT noted significant improvement in supine AROM and pt reported pain decreased to 1/10 after the session. PT will continue with manual therapy and ROM improvement as necessary.    Personal Factors and Comorbidities  Comorbidity 3+;Past/Current Experience;Time since onset of injury/illness/exacerbation;Profession    Examination-Activity Limitations  Caring for Others;Dressing;Carry;Hygiene/Grooming;Bathing    Examination-Participation Restrictions  Cleaning;Laundry;Community Activity    Stability/Clinical Decision Making  Evolving/Moderate complexity    Clinical Decision Making  Moderate    Rehab Potential  Good    PT Frequency  2x / week    PT Duration  8 weeks    PT Treatment/Interventions  ADLs/Self Care Home Management;Aquatic Therapy;Functional mobility training;Therapeutic activities;Therapeutic exercise;Manual techniques;Compression bandaging;Neuromuscular re-education    PT Next Visit Plan  mobility    PT Home Exercise Plan  doorway pec stretch, supine snow angels, table slides (flex/abd)    Consulted and Agree with Plan of Care  Patient        Patient will benefit from skilled therapeutic intervention in order to improve the following deficits and impairments:  Decreased activity tolerance, Decreased range of motion, Decreased strength, Hypomobility, Impaired sensation, Impaired UE functional use, Pain, Decreased mobility, Decreased scar mobility, Increased edema, Postural dysfunction  Visit Diagnosis: Chronic right shoulder pain  Stiffness of right shoulder, not elsewhere classified  Abnormal posture  Muscle weakness (generalized)     Problem List Patient Active Problem List   Diagnosis Date Noted  . Lymphedema of arm 12/21/2018  . Bilateral hand numbness 12/21/2018  . GERD without esophagitis 12/21/2018  . Malignant neoplasm of upper-outer quadrant of right breast in female, estrogen receptor positive ([place]) 12/07/2017  . Allergic conjunctivitis of both eyes 09/16/2017  . Family history of breast cancer 10/13/2016  . Dysplasia of cervix, low grade (CIN 1) 10/13/2016  . Thrombocytosis ([place]) 03/16/2016  . Hemoglobin C trait ([place]) 11/29/2015  . Hematuria 02/21/2015  . Vitamin D deficiency 02/21/2015  . Sterilization consult 02/21/2015  . Low back pain   . Morbid obesity ([place])   . Hyperlipidemia   . Hypertension   . IFG (impaired fasting glucose)   . Migraines    Shelton Silvas PT, DPT Ivin Booty, SPT Shelton Silvas 06/06/2019, 6:01 PM  [facility] [address] [place], [address] Phone: [phone]   Fax:  [phone]  Name: Mckenzie Lopez MRN: [id number] Date of Birth: [birth year]

## 2019-06-07 ENCOUNTER — Telehealth: Payer: Self-pay | Admitting: Family Medicine

## 2019-06-07 NOTE — Therapy (Signed)
[facility] [address], [place], [address] Phone: [phone]   Fax:  [phone]  Occupational Therapy Screen  Patient Details  Name: Mckenzie Lopez MRN: [id number] Date of Birth: [birth year] No data recorded  Encounter Date: 06/06/2019  OT End of Session - 06/06/19 1010    Visit Number  0       Past Medical History:  Diagnosis Date  . BRCA negative 2019   BRCA with Dr. Bary Castilla, 7/20 Vistaseq panel neg with Elmo Putt Copland, PA-C  . CHF (congestive heart failure) ([place])    POSTPARTUM 2017/ RESOLVED  . Eczema   . Hemoglobin C trait ([place]) 11/29/2015  . Hyperlipidemia   . Hypertension   . IFG (impaired fasting glucose)   . Intraductal carcinoma of right breast 11/2017   ER/PR+; Her2neu neg; mets to LN  . Low back pain   . Migraines   . Morbid obesity ([place])     Past Surgical History:  Procedure Laterality Date  . AXILLARY LYMPH NODE BIOPSY Right 12/01/2017   METASTATIC MAMMARY CARCINOMA  . AXILLARY LYMPH NODE BIOPSY Left 12/01/2017   NEGATIVE FOR MALIGNANCY  . BREAST BIOPSY Right 12/01/2017   11 and 12 o'clock, INVASIVE MAMMARY CARCINOMA, ER/PR positive HER2 negative  . MASTECTOMY Right 01/05/2018    There were no vitals filed for this visit.  Subjective Assessment - 06/06/19 1007    Subjective   I did not get my new compression garments yet - they ordered the wrong ones with narrow band - I did try the new night time one - but wearing my old daytime ones - my R arm feel heavier - and my L hand burns at night and at the base of my thumb [name] is hurts when I try and use it    Pertinent History   R modified radical mastectomy with lymph node dissection; negative margins; 5 of 21 lymph nodes positive for metastatic carcinoma;01/31/18 Chemotherapy Initiated: Dose-dense doxorubicin-cyclophosphamide (ddAC) q2 weeks x 4 cycles followed by weekly paclitaxel (weekly-T).           LYMPHEDEMA/ONCOLOGY QUESTIONNAIRE - 06/06/19 1051      Right Upper Extremity Lymphedema   15 cm Proximal to Olecranon Process  50.2 cm    10 cm Proximal to Olecranon Process  47 cm    Olecranon Process  35.5 cm    15 cm Proximal to Ulnar Styloid Process  32.8 cm    10 cm Proximal to Ulnar Styloid Process  28.4 cm    Just Proximal to Ulnar Styloid Process  23.8 cm    Across Hand at [employer]  24.3 cm    At [place] of 2nd Digit  8 cm    At [facility] of Thumb  8 cm      Pt arrive with her old compression daysleeve on R arm and glove  Report her new one - they ordered the wrong one -and still waiting for that  Do have the ones with the narrrow band - that they ordered wrong - but did not wear them  She did wear the new night time Jubilee night sleeve with powersleeve  But do not see as much dimples Want me to check   Measurements taken her R arm circumference increase again since last time measured  Fitted her with the [employer] Elvarex soft that the ordered with narrow band - pt to wear this at this time until wide band comes in. Her  old sleeve not containing her anymore - her measurements increase the last 2 visits.  Pt night time Phineas Semen was fitted - pt not pulling it high up enough because of her increase circumference - she need to ask her mom to help pull it up all the way  After wearing in clinic for 10 min - she had some good lymph flow in the inner upper arm and ulnar forearm. Cont to use pump - will be able to get lymphedema under control if she gets her new compression garments to contain her during day and night time.  Pt report during session she has still neuropathy in hands and feet,  But that her L hand really burns and hurts at night time - volar wrist  And then during day with use - her base of thumb /wrist  Pt has positive Finkelstein test and tender over distal radius - appear pt has DeQuervain with pulling her compression garments up - buckling her daughter in car  seat, picking up things with wide grip  Pt ed on modifying her activities - how she pick up, carry and grip objects. Pt to don contrast 2-3 x day  And fitted with prefab thumb spica to use during day  Will check on pt in 10 days                      OT Long Term Goals - 02/02/19 1746      OT LONG TERM GOAL #1   Title  Pt to show increase AROM in R shoulder to fix hair and reach into cabinets    Baseline  R shoulder flexion 110 , ABD 80 - was earlier this year 158 degrees - tightnes in anterior chest and under arm - now this date shoulder flexion 130 , ABD 100    Status  Deferred      OT LONG TERM GOAL #2   Title  Pt to be independent in use of new daytime compression and night time compression sleeve to decrease and maintain R UE circumference    Baseline  From June - upper arm and elbow increase 1 cm , and wrist 1.6 cm - need replacement daytime sleeve and glove -and night time compression too    Time  4    Period  Weeks    Status  On-going    Target Date  02/23/19      OT LONG TERM GOAL #3   Title  Pt scar tissue and soft tissue adhesion on R anterior chest improve for pt to report less tightness in chest with R shoulder AROM    Baseline  Tightness and with some shooting pain    Time  4    Period  Weeks    Status  On-going    Target Date  02/23/19              Patient will benefit from skilled therapeutic intervention in order to improve the following deficits and impairments:           Visit Diagnosis: Postmastectomy lymphedema syndrome    Problem List Patient Active Problem List   Diagnosis Date Noted  . Lymphedema of arm 12/21/2018  . Bilateral hand numbness 12/21/2018  . GERD without esophagitis 12/21/2018  . Malignant neoplasm of upper-outer quadrant of right breast in female, estrogen receptor positive ([place]) 12/07/2017  . Allergic conjunctivitis of both eyes 09/16/2017  . Family history of breast cancer 10/13/2016  . Dysplasia of  cervix, low grade (CIN 1) 10/13/2016  . Thrombocytosis ([place]) 03/16/2016  . Hemoglobin C trait ([place]) 11/29/2015  . Hematuria 02/21/2015  . Vitamin D deficiency 02/21/2015  . Sterilization consult 02/21/2015  . Low back pain   . Morbid obesity ([place])   . Hyperlipidemia   . Hypertension   . IFG (impaired fasting glucose)   . Migraines     Katrece Roediger OTR/L,CLT 06/07/2019, 10:10 AM  [facility] [address] [place], [address] Phone: [phone]   Fax:  [phone]  Name: Lyrica Mallarie Voorhies MRN: [id number] Date of Birth: 1985/09/03

## 2019-06-07 NOTE — Telephone Encounter (Signed)
Medication Refill - Medication: hydrALAZINE (APRESOLINE) 50 MG tablet   Has the patient contacted their pharmacy? Yes.   (Agent: If no, request that the patient contact the pharmacy for the refill.) (Agent: If yes, when and what did the pharmacy advise?)  Preferred Pharmacy (with phone number or street name):  [facility] [id number], [place] Phone:  [phone]  Fax:  [phone]       Agent: Please be advised that RX refills may take up to 3 business days. We ask that you follow-up with your pharmacy.

## 2019-06-08 MED ORDER — HYDRALAZINE HCL 50 MG PO TABS: 50.0000 mg | ORAL_TABLET | Freq: Three times a day (TID) | ORAL | 2 refills | Status: DC

## 2019-06-08 NOTE — Telephone Encounter (Signed)
Refill is sent

## 2019-06-09 ENCOUNTER — Other Ambulatory Visit: Payer: Self-pay

## 2019-06-09 ENCOUNTER — Encounter: Payer: Self-pay | Admitting: Physical Therapy

## 2019-06-09 ENCOUNTER — Ambulatory Visit: Payer: Medicaid Other | Admitting: Physical Therapy

## 2019-06-09 DIAGNOSIS — M6281 Muscle weakness (generalized): Secondary | ICD-10-CM | POA: Diagnosis not present

## 2019-06-09 DIAGNOSIS — M25511 Pain in right shoulder: Secondary | ICD-10-CM

## 2019-06-09 DIAGNOSIS — M25611 Stiffness of right shoulder, not elsewhere classified: Secondary | ICD-10-CM

## 2019-06-09 DIAGNOSIS — G8929 Other chronic pain: Secondary | ICD-10-CM | POA: Diagnosis not present

## 2019-06-09 DIAGNOSIS — R293 Abnormal posture: Secondary | ICD-10-CM

## 2019-06-09 DIAGNOSIS — I972 Postmastectomy lymphedema syndrome: Secondary | ICD-10-CM

## 2019-06-09 NOTE — Therapy (Addendum)
[facility] [address] [place], [place], [address] Phone: [phone]   Fax:  [phone]  Physical Therapy Treatment  Patient Details  Name: Mckenzie Lopez MRN: [id number] Date of Birth: 10/03/1985 Referring Provider (PT): Rachel Pienknagura PA (Oncology)   Encounter Date: 06/09/2019  PT End of Session - 06/09/19 0949    Visit Number  4    Number of Visits  17    Date for PT Re-Evaluation  07/21/19    Authorization Type  [place] [employer]    Authorization - Visit Number  3    Authorization - Number of Visits  3    PT Start Time  0815    PT Stop Time  0900    PT Time Calculation (min)  45 min    Activity Tolerance  Patient tolerated treatment well    Behavior During Therapy  WFL for tasks assessed/performed       Past Medical History:  Diagnosis Date  . BRCA negative 2019   BRCA with Dr. Byrnett, 7/20 Vistaseq panel neg with Alicia Copland, PA-C  . CHF (congestive heart failure) (HCC)    POSTPARTUM 2017/ RESOLVED  . Eczema   . 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 Hyperlipidemia   . Hypertension   . IFG (impaired fasting glucose)   . Intraductal carcinoma of right breast 11/2017   ER/PR+; Her2neu neg; mets to LN  . Low back pain   . Migraines   . Morbid obesity (HCC)     Past Surgical History:  Procedure Laterality Date  . AXILLARY LYMPH NODE BIOPSY Right 12/01/2017   METASTATIC MAMMARY CARCINOMA  . AXILLARY LYMPH NODE BIOPSY Left 12/01/2017   NEGATIVE FOR MALIGNANCY  . BREAST BIOPSY Right 12/01/2017   11 and 12 o'clock, INVASIVE MAMMARY CARCINOMA, ER/PR positive HER2 negative  . MASTECTOMY Right 01/05/2018    There were no vitals filed for this visit.  Subjective Assessment - 06/09/19 0817    Subjective  Pt responded well to dry needling treatment last session, but reports some continued tension today. Pt has some achy/throbbing sensation in R arm 8/10. Pt HEP going well.    Pertinent  History  Pt is a 33-year old female with hx of breast cancer and complaints of R shldr pain. Pt is s/p mastectomy and lymphectomy on 01/05/2018, chemotherapy, and radiation that brought on the R shoulder pain and stiffness. Pt reports skin irritation of the anterior pec, axillary region, and posterior shoulder pain. Pt history of periphreal neuropathy with numbess in bilat hands and feet, c/o of tingling present in R axillary and chest region. Pt reports muscle spasm sensation in upper trap, a "pulling sensation" in axillary region down to the Latissimus Dorsi, and a throbbing sensation in anterior pectoral region. Current pain is 7/10, at worst 10/10, at best 5/10. Pt reports stiffness in R shoulder upon waking and increasing pain throughout the day with activity. Aggravating factors include reaching OH or behind the back and immediate onset and 5 mins for pain to decrease. Easing factors include resting. Pt wears compression sleeve from hand to shoulder most of the day and overnight to decrease swelling. Pt struggles with ADLs including cooking, cleaning, dressing, caring for small child (3y/o daughter) and has been unable to function around the house at previous level from radiation. Pt goal is to decrease pain and increase shoulder ROM and strength for ADLs.    Limitations  Lifting;House hold activities    How long   can you sit comfortably?  10-15 minutes (shoulder)    How long can you stand comfortably?  10-15 minutes (also limited by pedal neuropathy)    How long can you walk comfortably?  about 15 minutes    Diagnostic tests  No imaging completed recently    Patient Stated Goals  To complete ADLs       THEREX Sitting abd at 90 deg at elbow 3# x6, 7, 8 with cueing for ER and keeping wrists out with good carryover Standing Ys 2x8 with good motion for shoulder flexion  MMT shoulder flexion, abd, IR, ER, lower trap, scapular retractors, shoulder extension   MANTHER -Grades I/II mobs in inferior,  lateral, and posterior x30 sec bouts 3 times in each direction for pain relief and increase ROM; good response from pt for both pain decrease and increased ROM in all directions -PROM in ABD/FLEX/ER holding for 30 sec ea direction x3 -Contract relax in flexion, abd and ER for 3 sec contract and 5 sec relax  FOTO 34  Pain at end of session 2/10                            PT Education - 06/09/19 0949    Education Details  therex form    Person(s) Educated  Patient    Methods  Explanation;Demonstration;Tactile cues    Comprehension  Verbalized understanding       PT Short Term Goals - 06/09/19 0819      PT SHORT TERM GOAL #1   Title  In 4 weeks, pt will decrease pain score by 2 point in order to make progress towards demonstrating clinically significant improvement in pain levels.    Baseline  05/25/19 7/10 pain; 06/09/2019 8/10    Time  4    Period  Weeks    Status  On-going      PT SHORT TERM GOAL #2   Title  Pt will be independent with HEP in order to improve strength and balance in order to decrease fall risk and improve function at home and work.    Baseline  05/25/19 HEP given; compliant with exercise    Time  4    Period  Weeks    Status  On-going        PT Long Term Goals - 06/09/19 0820      PT LONG TERM GOAL #1   Title  In 8 weeks, pt will decrease pain score by 4 points in order to improve functional ADL capability and care for young daughter.    Baseline  05/26/19 7/10 pain; 06/09/2019 8/10 pain    Time  8    Period  Weeks    Status  On-going      PT LONG TERM GOAL #2   Title  In 8 weeks, pt will improve AROM of R shoulder WNL in order to perform personal grooming and household ADLs    Baseline  05/26/19 ER: R occiput IR R PSIS Flexion 90; Abduction 82 both painful; 06/09/2019 ER: R occiput IR PSIS Flexion 100 ABD 95 painful for flexion/ABD    Time  8    Period  Weeks    Status  On-going      PT LONG TERM GOAL #3   Title  In 8 weeks,  pt will improve gross R shoulder and periscapular strength to 4+/5 MMT to perform heavy household chores and provide care to young daughter .      Baseline  05/26/19 Flexion/ABD/IR 4/5; lower trapezius 2-/5; scap retractors 3/5; I position 4/5; 06/09/2019 Flexion 4+/5, ABD 4/5, IR4/5 ER 4/5, lower trap 2+/5, middle trap 3/5, extension 4-/5    Time  8    Period  Weeks    Status  On-going      PT LONG TERM GOAL #4   Title  Patient will increase FOTO score to 60 to demonstrate predicted increase in functional mobility to complete ADLs    Baseline  05/26/19 30; 06/09/2019 34    Time  8    Period  Weeks    Status  On-going            Plan - 06/09/19 0953    Clinical Impression Statement  PT reassessed goals today. Pt made some improvements in AROM, but cannot achieve full range and is still lacking in strength especially for parascapular stabilizers. Pt pain level is higher than at evaluation and pt still has pain with AROM. FOTO score did increase slightly, but pt still needs to improve score. Pt did not meet any goals today, though progress is evident, and will benefit from rehabilitation. PT will continue with manual therapy and PROM with added strength exercises as appropriate.    Personal Factors and Comorbidities  Comorbidity 3+;Past/Current Experience;Time since onset of injury/illness/exacerbation;Profession    Examination-Activity Limitations  Caring for Others;Dressing;Carry;Hygiene/Grooming;Bathing    Examination-Participation Restrictions  Cleaning;Laundry;Community Activity    Stability/Clinical Decision Making  Evolving/Moderate complexity    Clinical Decision Making  Moderate    Rehab Potential  Good    PT Frequency  2x / week    PT Duration  8 weeks    PT Treatment/Interventions  ADLs/Self Care Home Management;Aquatic Therapy;Functional mobility training;Therapeutic activities;Therapeutic exercise;Manual techniques;Compression bandaging;Neuromuscular re-education    PT Next  Visit Plan  mobility    PT Home Exercise Plan  doorway pec stretch, supine snow angels, table slides (flex/abd)    Consulted and Agree with Plan of Care  Patient       Patient will benefit from skilled therapeutic intervention in order to improve the following deficits and impairments:  Decreased activity tolerance, Decreased range of motion, Decreased strength, Hypomobility, Impaired sensation, Impaired UE functional use, Pain, Decreased mobility, Decreased scar mobility, Increased edema, Postural dysfunction  Visit Diagnosis: Postmastectomy lymphedema syndrome  Chronic right shoulder pain  Stiffness of right shoulder, not elsewhere classified  Abnormal posture     Problem List Patient Active Problem List   Diagnosis Date Noted  . Lymphedema of arm 12/21/2018  . Bilateral hand numbness 12/21/2018  . GERD without esophagitis 12/21/2018  . Malignant neoplasm of upper-outer quadrant of right breast in female, estrogen receptor positive (HCC) 12/07/2017  . Allergic conjunctivitis of both eyes 09/16/2017  . Family history of breast cancer 10/13/2016  . Dysplasia of cervix, low grade (CIN 1) 10/13/2016  . Thrombocytosis (HCC) 03/16/2016  . 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 Hematuria 02/21/2015  . Vitamin D deficiency 02/21/2015  . Sterilization consult 02/21/2015  . Low back pain   . Morbid obesity (HCC)   . Hyperlipidemia   . Hypertension   . IFG (impaired fasting glucose)   . Migraines    Chelsea Miller PT, DPT Jennifer Schneible, SPT Chelsea Miller 06/09/2019, 10:17 AM  [facility] [address] [place], [place], [address] Phone: [phone]   Fax:  [phone]  Name: Mckenzie Lopez MRN: [id number] Date of Birth: 11/16/1985   

## 2019-06-15 ENCOUNTER — Other Ambulatory Visit: Payer: Self-pay

## 2019-06-15 ENCOUNTER — Ambulatory Visit: Payer: Medicaid Other | Attending: Physician Assistant | Admitting: Occupational Therapy

## 2019-06-15 ENCOUNTER — Encounter: Payer: Self-pay | Admitting: Occupational Therapy

## 2019-06-15 DIAGNOSIS — G8929 Other chronic pain: Secondary | ICD-10-CM | POA: Insufficient documentation

## 2019-06-15 DIAGNOSIS — R293 Abnormal posture: Secondary | ICD-10-CM | POA: Insufficient documentation

## 2019-06-15 DIAGNOSIS — M25511 Pain in right shoulder: Secondary | ICD-10-CM | POA: Diagnosis not present

## 2019-06-15 DIAGNOSIS — I972 Postmastectomy lymphedema syndrome: Secondary | ICD-10-CM | POA: Insufficient documentation

## 2019-06-15 DIAGNOSIS — M25611 Stiffness of right shoulder, not elsewhere classified: Secondary | ICD-10-CM | POA: Insufficient documentation

## 2019-06-15 NOTE — Therapy (Signed)
[facility] [address], [place], [address] Phone: [phone]   Fax:  [phone]  Occupational Therapy Evaluation  Patient Details  Name: Mckenzie Lopez MRN: [id number] Date of Birth: [birth year] Referring Provider (OT): Garlan Fair   Encounter Date: 06/15/2019  OT End of Session - 06/15/19 1238    Visit Number  1    Number of Visits  3    Date for OT Re-Evaluation  07/27/19    OT Start Time  1145    OT Stop Time  1220    OT Time Calculation (min)  35 min    Activity Tolerance  Patient tolerated treatment well    Behavior During Therapy  [facility] for tasks assessed/performed       Past Medical History:  Diagnosis Date  . BRCA negative 2019   BRCA with Dr. Bary Castilla, 7/20 Vistaseq panel neg with Elmo Putt Copland, PA-C  . CHF (congestive heart failure) ([place])    POSTPARTUM 2017/ RESOLVED  . Eczema   . Hemoglobin C trait ([place]) 11/29/2015  . Hyperlipidemia   . Hypertension   . IFG (impaired fasting glucose)   . Intraductal carcinoma of right breast 11/2017   ER/PR+; Her2neu neg; mets to LN  . Low back pain   . Migraines   . Morbid obesity ([place])     Past Surgical History:  Procedure Laterality Date  . AXILLARY LYMPH NODE BIOPSY Right 12/01/2017   METASTATIC MAMMARY CARCINOMA  . AXILLARY LYMPH NODE BIOPSY Left 12/01/2017   NEGATIVE FOR MALIGNANCY  . BREAST BIOPSY Right 12/01/2017   11 and 12 o'clock, INVASIVE MAMMARY CARCINOMA, ER/PR positive HER2 negative  . MASTECTOMY Right 01/05/2018    There were no vitals filed for this visit.  Subjective Assessment - 06/15/19 1234    Subjective   I did get my new compression sleeve with wide band last week - wearing it - doing okay = my mom is helping me to put on night time and daytime garments - because of my neuropathy I am having a lot of L thumb pain - burning - 9/10 - I am doing pump am and pm    Pertinent History   R modified radical mastectomy  with lymph node dissection; negative margins; 5 of 21 lymph nodes positive for metastatic carcinoma;01/31/18 Chemotherapy Initiated: Dose-dense doxorubicin-cyclophosphamide (ddAC) q2 weeks x 4 cycles followed by weekly paclitaxel (weekly-T).    Patient Stated Goals  I want to keep the lympedema under control     Currently in Pain?  Yes    Pain Score  9     Pain Location  Hand    Pain Orientation  Left    Pain Descriptors / Indicators  Tender;Throbbing;Aching;Burning    Pain Type  Acute pain    Pain Onset  1 to 4 weeks ago    Pain Frequency  Intermittent    Aggravating Factors   over 1st dorsal compartment on L - dequervain        [facility] OT Assessment - 06/15/19 0001      Assessment   Medical Diagnosis  R UE lymphedema     Referring Provider (OT)  Garlan Fair    Onset Date/Surgical Date  01/05/18    Hand Dominance  Left    Prior Therapy  --   address in 2019 end and first half of 2020     Precautions   Precaution Comments  --   R UE lymphedema  Prior Function   Vocation  On disability    Leisure  tv, read with daughter , some house work        LYMPHEDEMA/ONCOLOGY QUESTIONNAIRE - 06/15/19 1150      Right Upper Extremity Lymphedema   15 cm Proximal to Olecranon Process  49 cm    10 cm Proximal to Olecranon Process  46.6 cm    Olecranon Process  34.4 cm    15 cm Proximal to Ulnar Styloid Process  32.4 cm    10 cm Proximal to Ulnar Styloid Process  28.4 cm    Just Proximal to Ulnar Styloid Process  23 cm    Across Hand at [employer]  23.8 cm    At [place] of 2nd Digit  8 cm    At [facility] of Thumb  8.2 cm      Left Upper Extremity Lymphedema   15 cm Proximal to Olecranon Process  45.5 cm    10 cm Proximal to Olecranon Process  42 cm    Olecranon Process  31.5 cm    15 cm Proximal to Ulnar Styloid Process  30 cm    10 cm Proximal to Ulnar Styloid Process  26 cm    Just Proximal to Ulnar Styloid Process  20.5 cm    Across Hand at [employer]  20.5 cm    At  [place] of 2nd Digit  7 cm    At [facility] of Thumb  7.5 cm       Pt did decrease with wearing daytime compression new sleeve and glove since last time measured   pt to wear her new Daytime Jobst Elvarex soft sleeve and glove , and Jubiliee night sleeve for 2 wks and will reassess circumference  And use her pump still in am and pm  Thoracic lymphedema appear under control with pump and jovipak - to cont  And seeing PT for her shoulder rehab             OT Education - 06/15/19 1238    Education Details  wearing of compression sleeve , night , daytime and pump use    Person(s) Educated  Patient    Methods  Explanation;Demonstration;Handout    Comprehension  Verbalized understanding;Returned demonstration          OT Long Term Goals - 06/15/19 1243      OT LONG TERM GOAL #2   Title  Pt to be independent in use of new daytime compression and night time compression sleeve to decrease and maintain R UE circumference    Baseline  From June - upper arm and elbow increase circumference  - just fitted her this past week with replacement sleeves    Time  6    Period  Weeks    Status  New    Target Date  07/27/19            Plan - 06/15/19 1239    Clinical Impression Statement  Pt was seen by this OT since 2019 for R UE lymphedema and thoracic- pt in need end of last year for replacing her daytime and night time compression sleeves - was not providing enough compression anymore and pt's circumference increase - pt arrive this date with new daytime Jobst Elvarex soft sleeve and glove , and Jubilee night time compression sleeve - appear to fit well - pt ed on donning corretcly - mom to assist because of her neuropathy from chemo - pt develop what  appear like Dequervain on L wrist - pt to wear for 2 wks in combination of pump and will assess containment in 2 wks    OT Occupational Profile and History  Problem Focused Assessment - Including review of records relating to presenting problem     Occupational performance deficits (Please refer to evaluation for details):  IADL's;Work;Leisure;Play    Body Structure / Function / Physical Skills  ADL;Scar mobility;Edema;Sensation;UE functional use;Flexibility;ROM;Pain;Strength    Rehab Potential  Good    Clinical Decision Making  Limited treatment options, no task modification necessary    Comorbidities Affecting Occupational Performance:  May have comorbidities impacting occupational performance   chemo induce neuropathy   Modification or Assistance to Complete Evaluation   Min-Moderate modification of tasks or assist with assess necessary to complete eval    OT Frequency  Biweekly    OT Duration  6 weeks    OT Treatment/Interventions  Self-care/ADL training;Manual lymph drainage;Patient/family education;Therapeutic exercise;Scar mobilization;DME and/or AE instruction;Passive range of motion;Manual Therapy    Plan  assess containment of lymphedema with compression and pump    OT Home Exercise Plan  see pt instruction     Consulted and Agree with Plan of Care  Patient       Patient will benefit from skilled therapeutic intervention in order to improve the following deficits and impairments:   Body Structure / Function / Physical Skills: ADL, Scar mobility, Edema, Sensation, UE functional use, Flexibility, ROM, Pain, Strength       Visit Diagnosis: Postmastectomy lymphedema syndrome - Plan: Ot plan of care cert/re-cert    Problem List Patient Active Problem List   Diagnosis Date Noted  . Lymphedema of arm 12/21/2018  . Bilateral hand numbness 12/21/2018  . GERD without esophagitis 12/21/2018  . Malignant neoplasm of upper-outer quadrant of right breast in female, estrogen receptor positive ([place]) 12/07/2017  . Allergic conjunctivitis of both eyes 09/16/2017  . Family history of breast cancer 10/13/2016  . Dysplasia of cervix, low grade (CIN 1) 10/13/2016  . Thrombocytosis ([place]) 03/16/2016  . Hemoglobin C trait ([place])  11/29/2015  . Hematuria 02/21/2015  . Vitamin D deficiency 02/21/2015  . Sterilization consult 02/21/2015  . Low back pain   . Morbid obesity ([place])   . Hyperlipidemia   . Hypertension   . IFG (impaired fasting glucose)   . Migraines     Ariadna Setter OTR/L,CLT 06/15/2019, 12:46 PM  [place] [facility] [address] [place], [address] Phone: [phone]   Fax:  [phone]  Name: Mckenzie Lopez MRN: [id number] Date of Birth: 1986-02-08

## 2019-06-15 NOTE — Patient Instructions (Signed)
See note

## 2019-06-20 ENCOUNTER — Encounter: Payer: Self-pay | Admitting: Physical Therapy

## 2019-06-20 ENCOUNTER — Other Ambulatory Visit: Payer: Self-pay

## 2019-06-20 ENCOUNTER — Ambulatory Visit: Payer: Medicaid Other | Admitting: Physical Therapy

## 2019-06-20 DIAGNOSIS — I972 Postmastectomy lymphedema syndrome: Secondary | ICD-10-CM

## 2019-06-20 DIAGNOSIS — R293 Abnormal posture: Secondary | ICD-10-CM

## 2019-06-20 DIAGNOSIS — G8929 Other chronic pain: Secondary | ICD-10-CM

## 2019-06-20 DIAGNOSIS — M25511 Pain in right shoulder: Secondary | ICD-10-CM | POA: Diagnosis not present

## 2019-06-20 DIAGNOSIS — M25611 Stiffness of right shoulder, not elsewhere classified: Secondary | ICD-10-CM

## 2019-06-20 IMAGING — US US BREAST*R* LIMITED INC AXILLA
1 series · 13 of 25 positions shown · non-contrast
Comparison: Previous exam(s).

CLINICAL DATA: 32-year-old female recalled from baseline screening
mammogram dated 11/19/2017 for right breast masses in left breast
asymmetry.

EXAM:
DIGITAL DIAGNOSTIC BILATERAL MAMMOGRAM WITH CAD AND TOMO
ULTRASOUND BILATERAL BREAST

[Series 1: us breast*right* limited inc axilla · 0.05mm/px · 13 of 28 slices shown]
[im 1/28]
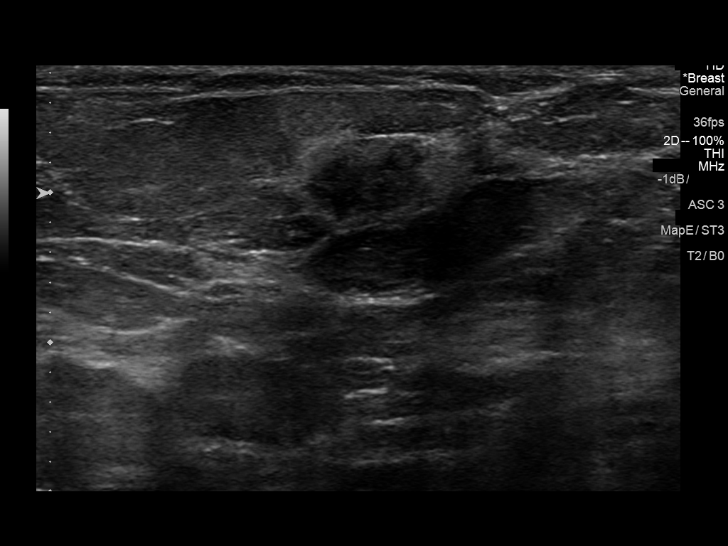
[im 3/28]
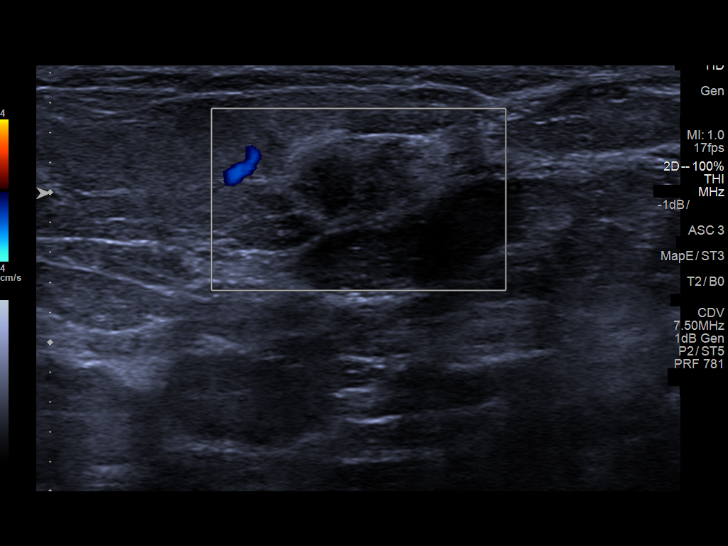
[im 5/28]
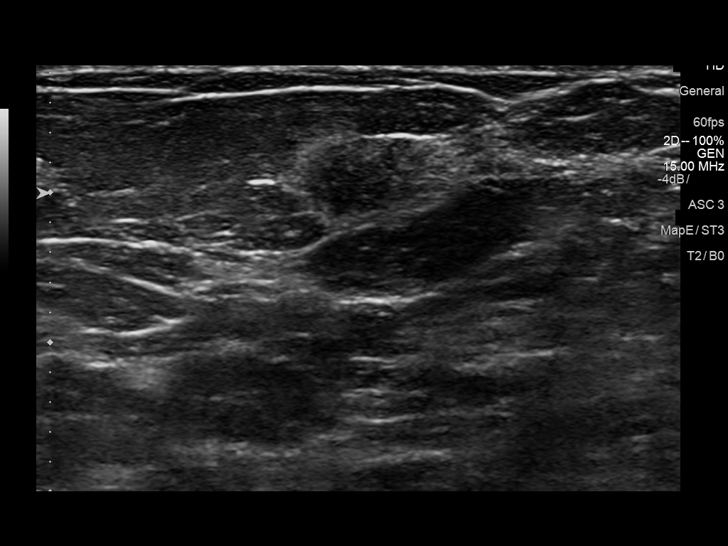
[im 7/28]
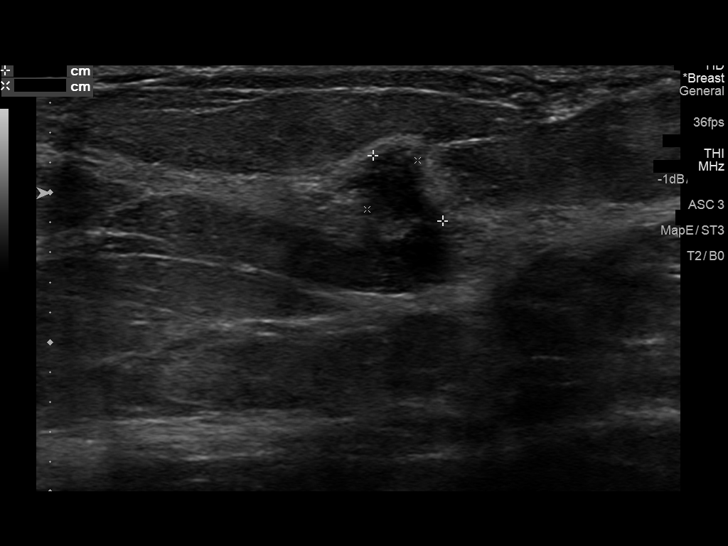
[im 10/28]
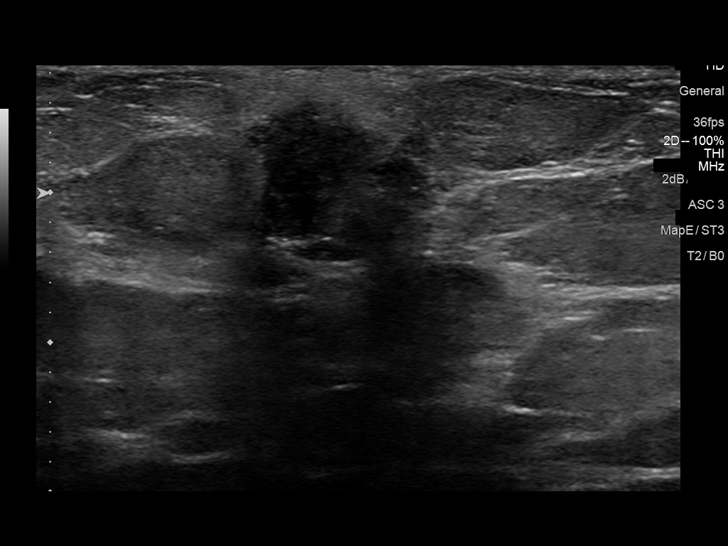
[im 12/28]
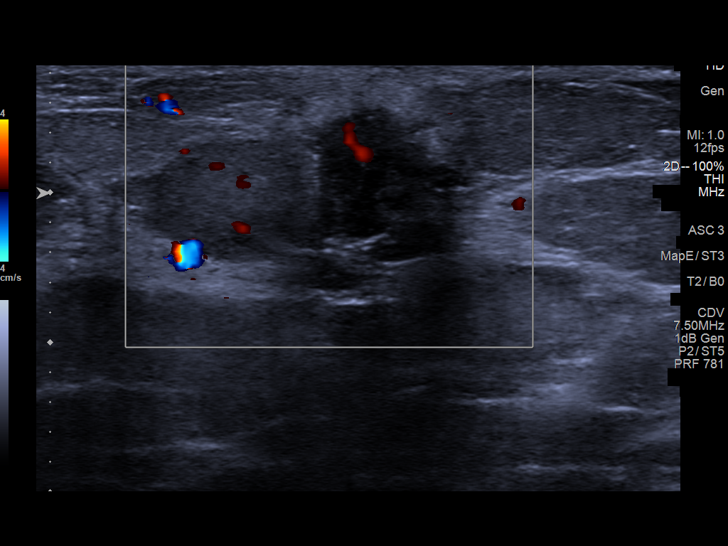
[im 14/28]
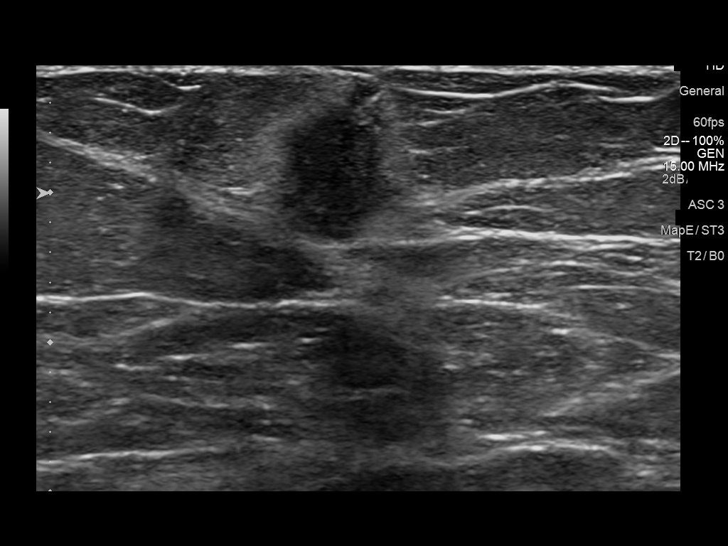
[im 16/28]
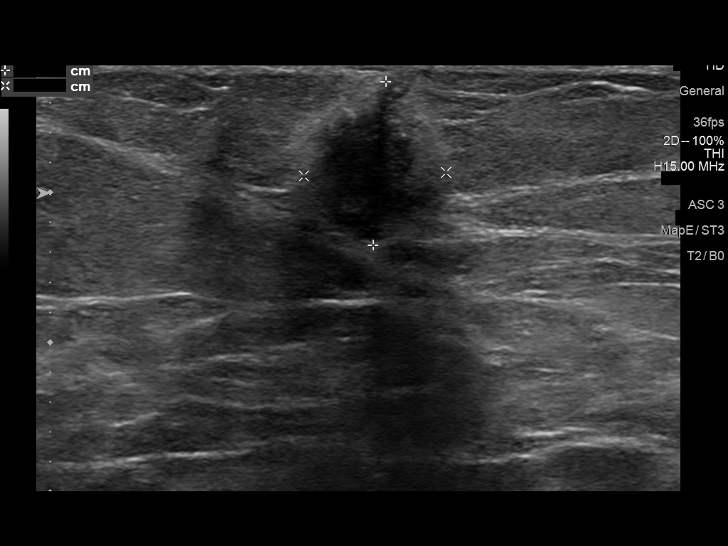
[im 19/28]
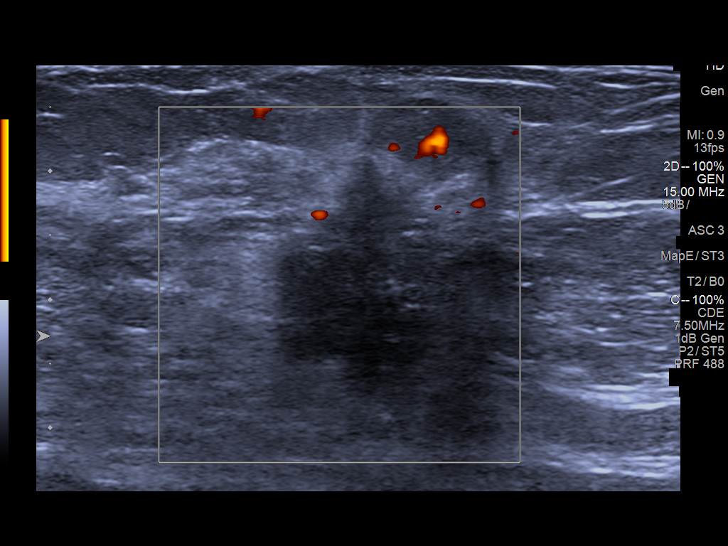
[im 21/28]
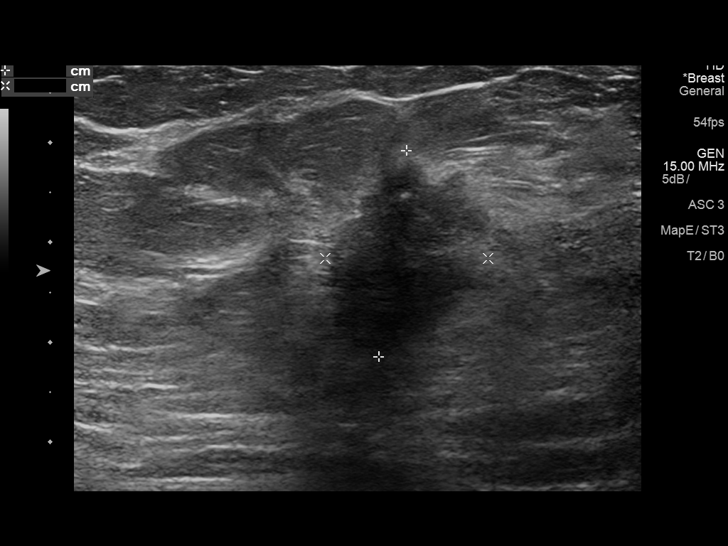
[im 23/28]
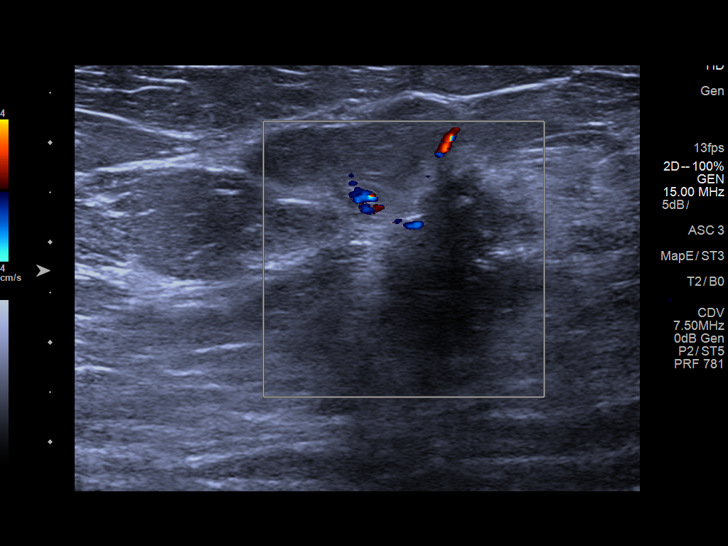
[im 25/28]
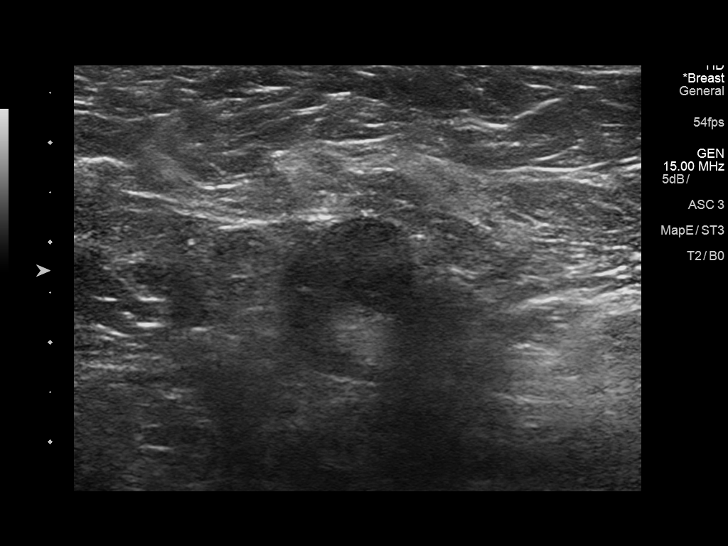
[im 28/28]
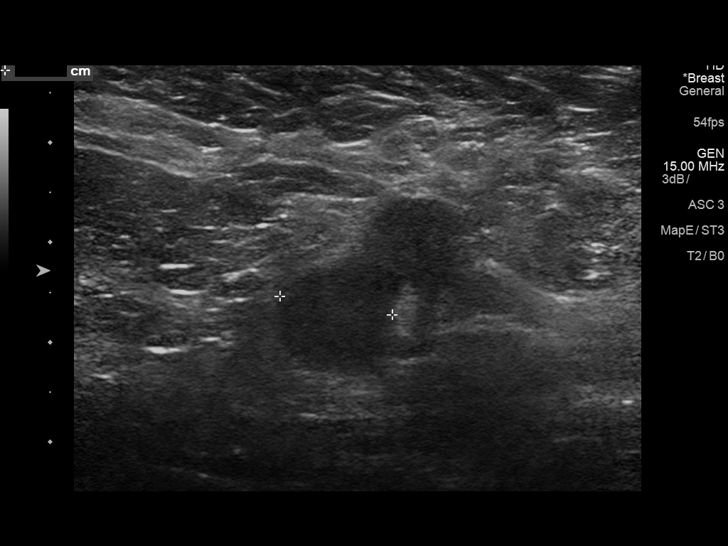

[13 of 25 positions shown; findings below may reference images not displayed]

ACR Breast Density Category b: There are scattered areas of
fibroglandular density.
FINDINGS: Additional mammographic views demonstrate hyperdense spiculated
masses in the upper outer right breast at anterior depth and middle
depth. At least 3 masses are identified mammographically. Further
evaluation with ultrasound was performed.

Additional mammographic views of the upper left breast demonstrate
dispersed mint of the previously identified asymmetry into fatty and
fibroglandular tissue. Precautionary ultrasound was performed.

Mammographic images were processed with CAD.

On physical exam, I palpate a firm 1-2 cm immobile mass in the upper
outer right breast anteriorly.

Targeted ultrasound is performed, showing an irregular hypoechoic
mass with associated vascularity at the 11 o'clock position 4 cm
from the nipple. It measures 2.1 x 1.7 x 1.6 cm. An additional
similar-appearing mass is noted at the 12 o'clock position 2 cm from
the nipple measuring 0.9 x 0.6 x 0.5 cm. A third mass is identified
at the 12 o'clock position 8 cm from the nipple measuring 1.1 x
x 1.0 cm.

Evaluation of the right axilla demonstrates morphologically abnormal
lymph nodes demonstrating up to 1.1 cm of cortical thickening.

Evaluation of the left axilla demonstrates diffuse thickening of a
single left axillary lymph node up to 0.5 cm. No additional
suspicious findings are identified in the left axilla corresponding
with the screening mammographic finding on the left.
IMPRESSION: 1. Three highly suspicious right breast masses corresponding with
the screening mammographic findings. Recommendation is for
ultrasound-guided biopsy of the masses at the 11 o'clock position 4
cm from the nipple and 12 o'clock position 8 cm from the nipple.
2. Suspicious, morphologically abnormal right axillary
lymphadenopathy. Recommendation is for ultrasound-guided biopsy.
3. Indeterminate left axillary lymphadenopathy. Recommendation is
for ultrasound-guided biopsy.

RECOMMENDATION:
Four area ultrasound-guided biopsy of 2 suspicious right breast
masses, a suspicious right axillary lymph node and an indeterminate
left axillary lymph node.

I have discussed the findings and recommendations with the patient.
Results were also provided in writing at the conclusion of the
visit. If applicable, a reminder letter will be sent to the patient
regarding the next appointment.

BI-RADS CATEGORY  5: Highly suggestive of malignancy.

## 2019-06-20 NOTE — Therapy (Addendum)
[facility] [address], [place], [address] Phone: [phone]   Fax:  [phone]  Physical Therapy Treatment  Patient Details  Name: Mckenzie Lopez MRN: [id number] Date of Birth: 01/20/1986 Referring Provider (PT): [name] PA (Oncology)   Encounter Date: 06/20/2019  PT End of Session - 06/20/19 1113    Visit Number  5    Number of Visits  17    Date for PT Re-Evaluation  07/21/19    Authorization Type  [place] Medicaid    Authorization Time Period  06/16/2019-07/27/2019    Authorization - Visit Number  1    Authorization - Number of Visits  12    PT Start Time  1030    PT Stop Time  1110    PT Time Calculation (min)  40 min    Equipment Utilized During Treatment  Other (comment)    Activity Tolerance  Patient tolerated treatment well    Behavior During Therapy  [facility] for tasks assessed/performed       Past Medical History:  Diagnosis Date  . BRCA negative 2019   BRCA with Dr. Bary Castilla, 7/20 Vistaseq panel neg with Elmo Putt Copland, PA-C  . CHF (congestive heart failure) ([place])    POSTPARTUM 2017/ RESOLVED  . Eczema   . Hemoglobin C trait ([place]) 11/29/2015  . Hyperlipidemia   . Hypertension   . IFG (impaired fasting glucose)   . Intraductal carcinoma of right breast 11/2017   ER/PR+; Her2neu neg; mets to LN  . Low back pain   . Migraines   . Morbid obesity ([place])     Past Surgical History:  Procedure Laterality Date  . AXILLARY LYMPH NODE BIOPSY Right 12/01/2017   METASTATIC MAMMARY CARCINOMA  . AXILLARY LYMPH NODE BIOPSY Left 12/01/2017   NEGATIVE FOR MALIGNANCY  . BREAST BIOPSY Right 12/01/2017   11 and 12 o'clock, INVASIVE MAMMARY CARCINOMA, ER/PR positive HER2 negative  . MASTECTOMY Right 01/05/2018    There were no vitals filed for this visit.  Subjective Assessment - 06/20/19 1032    Subjective  Pt reports 5-6/10 R arm pain today. Pt started seeing OT along with PT last  week. Pt HEP has been going well.    Pertinent History  Pt is a 34 year old female with hx of breast cancer and complaints of R shldr pain. Pt is s/p mastectomy and lymphectomy on 01/05/2018, chemotherapy, and radiation that brought on the R shoulder pain and stiffness. Pt reports skin irritation of the anterior pec, axillary region, and posterior shoulder pain. Pt history of periphreal neuropathy with numbess in bilat hands and feet, c/o of tingling present in R axillary and chest region. Pt reports muscle spasm sensation in upper trap, a "pulling sensation" in axillary region down to the Latissimus Dorsi, and a throbbing sensation in anterior pectoral region. Current pain is 7/10, at worst 10/10, at best 5/10. Pt reports stiffness in R shoulder upon waking and increasing pain throughout the day with activity. Aggravating factors include reaching OH or behind the back and immediate onset and 5 mins for pain to decrease. Easing factors include resting. Pt wears compression sleeve from hand to shoulder most of the day and overnight to decrease swelling. Pt struggles with ADLs including cooking, cleaning, dressing, caring for small child (3y/o daughter) and has been unable to function around the house at previous level from radiation. Pt goal is to decrease pain and increase shoulder ROM and strength for ADLs.  Limitations  Lifting;House hold activities    How long can you sit comfortably?  10-15 minutes (shoulder)    How long can you stand comfortably?  10-15 minutes (also limited by pedal neuropathy)    How long can you walk comfortably?  about 15 minutes    Diagnostic tests  No imaging completed recently    Patient Stated Goals  To complete ADLs       THEREX -Wall slides in tri-planar direction (superior/lateral/inferior) up wall with Green band at wrist 2x5 ea hand; with good scapular control, PT corrected trunk lean with good success -OMEGA Lat pull down 20# x10, 25# x10,12 with good scapular  control -Face pulls 10# x10, 15# x10; with cueing to keep elbows at 90 deg ABD and scapular retraction  MANTHER -PROM with holds x15 sec holds for FLEX/ABD -Mobilizations for nferior, Posterior, distraction for 3 bouts 30 sec each -Contract/relax technique for FLEX/ABD x3 contract 3 sec, relax 5 sec                          PT Education - 06/20/19 1101    Education Details  therex form    Person(s) Educated  Patient    Methods  Demonstration;Explanation;Tactile cues    Comprehension  Verbalized understanding       PT Short Term Goals - 06/09/19 0819      PT SHORT TERM GOAL #1   Title  In 4 weeks, pt will decrease pain score by 2 point in order to make progress towards demonstrating clinically significant improvement in pain levels.    Baseline  05/25/19 7/10 pain; 06/09/2019 8/10    Time  4    Period  Weeks    Status  On-going      PT SHORT TERM GOAL #2   Title  Pt will be independent with HEP in order to improve strength and balance in order to decrease fall risk and improve function at home and work.    Baseline  05/25/19 HEP given; compliant with exercise    Time  4    Period  Weeks    Status  On-going        PT Long Term Goals - 06/09/19 0820      PT LONG TERM GOAL #1   Title  In 8 weeks, pt will decrease pain score by 4 points in order to improve functional ADL capability and care for young daughter.    Baseline  05/26/19 7/10 pain; 06/09/2019 8/10 pain    Time  8    Period  Weeks    Status  On-going      PT LONG TERM GOAL #2   Title  In 8 weeks, pt will improve AROM of R shoulder WNL in order to perform personal grooming and household ADLs    Baseline  05/26/19 ER: R occiput IR R PSIS Flexion 90; Abduction 82 both painful; 06/09/2019 ER: R occiput IR PSIS Flexion 100 ABD 95 painful for flexion/ABD    Time  8    Period  Weeks    Status  On-going      PT LONG TERM GOAL #3   Title  In 8 weeks, pt will improve gross R shoulder and periscapular  strength to 4+/5 MMT to perform heavy household chores and provide care to young daughter .    Baseline  05/26/19 Flexion/ABD/IR 4/5; lower trapezius 2-/5; scap retractors 3/5; I position 4/5; 06/09/2019 Flexion 4+/5, ABD 4/5, IR4/5  ER 4/5, lower trap 2+/5, middle trap 3/5, extension 4-/5    Time  8    Period  Weeks    Status  On-going      PT LONG TERM GOAL #4   Title  Patient will increase FOTO score to 60 to demonstrate predicted increase in functional mobility to complete ADLs    Baseline  05/26/19 30; 06/09/2019 34    Time  8    Period  Weeks    Status  On-going            Plan - 06/20/19 1124    Clinical Impression Statement  PT continued with manual therapy with improvement in ROM and progressed strength therex focusing on scapular stability. Pt has good motor control of scpaular stabilizers and is making progress with ROM. Pt has good response to manual with decreasing pain and increasing PROM. PT added new HEP of supine chest openers for long holds and upper trapezius stretch. PT will continue with progressions of strength and man ther as needed.    Personal Factors and Comorbidities  Comorbidity 3+;Past/Current Experience;Time since onset of injury/illness/exacerbation;Profession    Examination-Activity Limitations  Caring for Others;Dressing;Carry;Hygiene/Grooming;Bathing    Examination-Participation Restrictions  Cleaning;Laundry;Community Activity    Stability/Clinical Decision Making  Evolving/Moderate complexity    Clinical Decision Making  Moderate    Rehab Potential  Good    PT Frequency  2x / week    PT Duration  8 weeks    PT Treatment/Interventions  ADLs/Self Care Home Management;Aquatic Therapy;Functional mobility training;Therapeutic activities;Therapeutic exercise;Manual techniques;Compression bandaging;Neuromuscular re-education    PT Next Visit Plan  mobility, parascapular strength    PT Home Exercise Plan  doorway pec stretch, supine snow angels, table slides  (flex/abd), supine long direction pec stretch, lateral neck flexion stretch for upper trap    Consulted and Agree with Plan of Care  Patient       Patient will benefit from skilled therapeutic intervention in order to improve the following deficits and impairments:  Decreased activity tolerance, Decreased range of motion, Decreased strength, Hypomobility, Impaired sensation, Impaired UE functional use, Pain, Decreased mobility, Decreased scar mobility, Increased edema, Postural dysfunction  Visit Diagnosis: Postmastectomy lymphedema syndrome  Chronic right shoulder pain  Stiffness of right shoulder, not elsewhere classified  Abnormal posture     Problem List Patient Active Problem List   Diagnosis Date Noted  . Lymphedema of arm 12/21/2018  . Bilateral hand numbness 12/21/2018  . GERD without esophagitis 12/21/2018  . Malignant neoplasm of upper-outer quadrant of right breast in female, estrogen receptor positive ([place]) 12/07/2017  . Allergic conjunctivitis of both eyes 09/16/2017  . Family history of breast cancer 10/13/2016  . Dysplasia of cervix, low grade (CIN 1) 10/13/2016  . Thrombocytosis ([place]) 03/16/2016  . Hemoglobin C trait ([place]) 11/29/2015  . Hematuria 02/21/2015  . Vitamin D deficiency 02/21/2015  . Sterilization consult 02/21/2015  . Low back pain   . Morbid obesity ([place])   . Hyperlipidemia   . Hypertension   . IFG (impaired fasting glucose)   . Migraines    Shelton Silvas PT, DPT Ivin Booty, SPT Shelton Silvas 06/20/2019, 12:01 PM  [facility] [address] [place], [address] Phone: [phone]   Fax:  [phone]  Name: Tyrina Alejandrina Raimer MRN: [id number] Date of Birth: March 07, 1986

## 2019-06-27 ENCOUNTER — Encounter: Payer: Self-pay | Admitting: Physical Therapy

## 2019-06-27 ENCOUNTER — Ambulatory Visit: Payer: Medicaid Other | Admitting: Occupational Therapy

## 2019-06-27 ENCOUNTER — Other Ambulatory Visit: Payer: Self-pay

## 2019-06-27 ENCOUNTER — Encounter: Payer: Self-pay | Admitting: Family Medicine

## 2019-06-27 ENCOUNTER — Ambulatory Visit: Payer: Medicaid Other | Admitting: Physical Therapy

## 2019-06-27 DIAGNOSIS — G620 Drug-induced polyneuropathy: Secondary | ICD-10-CM | POA: Diagnosis not present

## 2019-06-27 DIAGNOSIS — M25611 Stiffness of right shoulder, not elsewhere classified: Secondary | ICD-10-CM

## 2019-06-27 DIAGNOSIS — Z79811 Long term (current) use of aromatase inhibitors: Secondary | ICD-10-CM | POA: Diagnosis not present

## 2019-06-27 DIAGNOSIS — I972 Postmastectomy lymphedema syndrome: Secondary | ICD-10-CM

## 2019-06-27 DIAGNOSIS — Z9189 Other specified personal risk factors, not elsewhere classified: Secondary | ICD-10-CM | POA: Diagnosis not present

## 2019-06-27 DIAGNOSIS — C50811 Malignant neoplasm of overlapping sites of right female breast: Secondary | ICD-10-CM | POA: Diagnosis not present

## 2019-06-27 DIAGNOSIS — M25511 Pain in right shoulder: Secondary | ICD-10-CM | POA: Diagnosis not present

## 2019-06-27 DIAGNOSIS — G8929 Other chronic pain: Secondary | ICD-10-CM

## 2019-06-27 DIAGNOSIS — I890 Lymphedema, not elsewhere classified: Secondary | ICD-10-CM | POA: Diagnosis not present

## 2019-06-27 DIAGNOSIS — E559 Vitamin D deficiency, unspecified: Secondary | ICD-10-CM | POA: Diagnosis not present

## 2019-06-27 DIAGNOSIS — Z170 Estrogen receptor positive status [ER+]: Secondary | ICD-10-CM | POA: Diagnosis not present

## 2019-06-27 DIAGNOSIS — R293 Abnormal posture: Secondary | ICD-10-CM

## 2019-06-27 NOTE — Therapy (Addendum)
[facility] [address], [place], [address] Phone: [phone]   Fax:  [phone]  Physical Therapy Treatment  Patient Details  Name: Mckenzie Lopez MRN: [id number] Date of Birth: Feb 12, 1986 Referring Provider (PT): [name] PA (Oncology)   Encounter Date: 06/27/2019  PT End of Session - 06/27/19 1707    Visit Number  6    Number of Visits  17    Date for PT Re-Evaluation  07/21/19    Authorization Type  [place] Medicaid    Authorization Time Period  06/16/2019-07/27/2019    Authorization - Visit Number  2    Authorization - Number of Visits  12    PT Start Time  1601    PT Stop Time  1555    PT Time Calculation (min)  40 min    Activity Tolerance  Patient tolerated treatment well    Behavior During Therapy  [facility] for tasks assessed/performed       Past Medical History:  Diagnosis Date  . BRCA negative 2019   BRCA with Dr. Bary Castilla, 7/20 Vistaseq panel neg with Elmo Putt Copland, PA-C  . CHF (congestive heart failure) ([place])    POSTPARTUM 2017/ RESOLVED  . Eczema   . Hemoglobin C trait ([place]) 11/29/2015  . Hyperlipidemia   . Hypertension   . IFG (impaired fasting glucose)   . Intraductal carcinoma of right breast 11/2017   ER/PR+; Her2neu neg; mets to LN  . Low back pain   . Migraines   . Morbid obesity ([place])     Past Surgical History:  Procedure Laterality Date  . AXILLARY LYMPH NODE BIOPSY Right 12/01/2017   METASTATIC MAMMARY CARCINOMA  . AXILLARY LYMPH NODE BIOPSY Left 12/01/2017   NEGATIVE FOR MALIGNANCY  . BREAST BIOPSY Right 12/01/2017   11 and 12 o'clock, INVASIVE MAMMARY CARCINOMA, ER/PR positive HER2 negative  . MASTECTOMY Right 01/05/2018    There were no vitals filed for this visit.  Subjective Assessment - 06/27/19 1517    Subjective  Pt feeling a little tired today, with 5/10 pain today. Pt saw OT today as well. HEP has been going well. Pt does not have any neck  pain today and reports good compliance with upper trap stretching.    Pertinent History  Pt is a 34 year old female with hx of breast cancer and complaints of R shldr pain. Pt is s/p mastectomy and lymphectomy on 01/05/2018, chemotherapy, and radiation that brought on the R shoulder pain and stiffness. Pt reports skin irritation of the anterior pec, axillary region, and posterior shoulder pain. Pt history of periphreal neuropathy with numbess in bilat hands and feet, c/o of tingling present in R axillary and chest region. Pt reports muscle spasm sensation in upper trap, a "pulling sensation" in axillary region down to the Latissimus Dorsi, and a throbbing sensation in anterior pectoral region. Current pain is 7/10, at worst 10/10, at best 5/10. Pt reports stiffness in R shoulder upon waking and increasing pain throughout the day with activity. Aggravating factors include reaching OH or behind the back and immediate onset and 5 mins for pain to decrease. Easing factors include resting. Pt wears compression sleeve from hand to shoulder most of the day and overnight to decrease swelling. Pt struggles with ADLs including cooking, cleaning, dressing, caring for small child (3y/o daughter) and has been unable to function around the house at previous level from radiation. Pt goal is to decrease pain and increase shoulder ROM  and strength for ADLs.    Limitations  Lifting;House hold activities    How long can you sit comfortably?  10-15 minutes (shoulder)    How long can you stand comfortably?  10-15 minutes (also limited by pedal neuropathy)    How long can you walk comfortably?  about 15 minutes    Diagnostic tests  No imaging completed recently    Patient Stated Goals  To complete ADLs        THEREX -Sitting "Y" 2x10; tactile cueing for scapular retraction and keeping shoulders down and back  -Seated ABD to [facility] press at 90 2# weight 3x8; with heavy cueing for keeping shoulders down and back good carryover;  had to do last -OMEGA lat pull down 25x10, with hand cramping with grip that comes and goes   -Wall walks with RTB at wrist 2x4 up and down; cueing for scap retraction   MANTHER -PROM for FLEX/ABD/ER x15-30 sec holds  -Contract/Relax 3sec/5sec 3 bouts for FLEX/ABD/ER  -Inferior/lateral/posterior mobs 3 bouts of 15 sec each  PT notes improvement in ROM during strength after MANTHER                         PT Education - 06/27/19 1706    Education Details  therex form    Person(s) Educated  Patient    Methods  Explanation;Demonstration;Verbal cues    Comprehension  Verbalized understanding;Returned demonstration       PT Short Term Goals - 06/09/19 0819      PT SHORT TERM GOAL #1   Title  In 4 weeks, pt will decrease pain score by 2 point in order to make progress towards demonstrating clinically significant improvement in pain levels.    Baseline  05/25/19 7/10 pain; 06/09/2019 8/10    Time  4    Period  Weeks    Status  On-going      PT SHORT TERM GOAL #2   Title  Pt will be independent with HEP in order to improve strength and balance in order to decrease fall risk and improve function at home and work.    Baseline  05/25/19 HEP given; compliant with exercise    Time  4    Period  Weeks    Status  On-going        PT Long Term Goals - 06/09/19 0820      PT LONG TERM GOAL #1   Title  In 8 weeks, pt will decrease pain score by 4 points in order to improve functional ADL capability and care for young daughter.    Baseline  05/26/19 7/10 pain; 06/09/2019 8/10 pain    Time  8    Period  Weeks    Status  On-going      PT LONG TERM GOAL #2   Title  In 8 weeks, pt will improve AROM of R shoulder WNL in order to perform personal grooming and household ADLs    Baseline  05/26/19 ER: R occiput IR R PSIS Flexion 90; Abduction 82 both painful; 06/09/2019 ER: R occiput IR PSIS Flexion 100 ABD 95 painful for flexion/ABD    Time  8    Period  Weeks    Status   On-going      PT LONG TERM GOAL #3   Title  In 8 weeks, pt will improve gross R shoulder and periscapular strength to 4+/5 MMT to perform heavy household chores and provide care to young daughter .  Baseline  05/26/19 Flexion/ABD/IR 4/5; lower trapezius 2-/5; scap retractors 3/5; I position 4/5; 06/09/2019 Flexion 4+/5, ABD 4/5, IR4/5 ER 4/5, lower trap 2+/5, middle trap 3/5, extension 4-/5    Time  8    Period  Weeks    Status  On-going      PT LONG TERM GOAL #4   Title  Patient will increase FOTO score to 60 to demonstrate predicted increase in functional mobility to complete ADLs    Baseline  05/26/19 30; 06/09/2019 34    Time  8    Period  Weeks    Status  On-going            Plan - 06/27/19 1559    Clinical Impression Statement  PT continued manual therapy with success in pain reduction and increasing ROM in strength therex. PT continued with strength therex focusing on OH motion and parascapular strenghtening with good carryover. Pt reports some discomfort in hand cramping during gripping exercises, that seems to come and go at random. PT will continue to monitor this and make adjustments to grips as needed. PT will continue with progressions and man therapy as needed.    Personal Factors and Comorbidities  Comorbidity 3+;Past/Current Experience;Time since onset of injury/illness/exacerbation;Profession    Examination-Activity Limitations  Caring for Others;Dressing;Carry;Hygiene/Grooming;Bathing    Examination-Participation Restrictions  Cleaning;Laundry;Community Activity    Stability/Clinical Decision Making  Evolving/Moderate complexity    Clinical Decision Making  Moderate    Rehab Potential  Good    PT Frequency  2x / week    PT Duration  8 weeks    PT Treatment/Interventions  ADLs/Self Care Home Management;Aquatic Therapy;Functional mobility training;Therapeutic activities;Therapeutic exercise;Manual techniques;Compression bandaging;Neuromuscular re-education    PT Next  Visit Plan  mobility, parascapular strength    PT Home Exercise Plan  doorway pec stretch, supine snow angels, table slides (flex/abd), supine long direction pec stretch, lateral neck flexion stretch for upper trap    Consulted and Agree with Plan of Care  Patient       Patient will benefit from skilled therapeutic intervention in order to improve the following deficits and impairments:  Decreased activity tolerance, Decreased range of motion, Decreased strength, Hypomobility, Impaired sensation, Impaired UE functional use, Pain, Decreased mobility, Decreased scar mobility, Increased edema, Postural dysfunction  Visit Diagnosis: Postmastectomy lymphedema syndrome  Chronic right shoulder pain  Stiffness of right shoulder, not elsewhere classified  Abnormal posture     Problem List Patient Active Problem List   Diagnosis Date Noted  . Lymphedema of arm 12/21/2018  . Bilateral hand numbness 12/21/2018  . GERD without esophagitis 12/21/2018  . Malignant neoplasm of upper-outer quadrant of right breast in female, estrogen receptor positive ([facility]) 12/07/2017  . Allergic conjunctivitis of both eyes 09/16/2017  . Family history of breast cancer 10/13/2016  . Dysplasia of cervix, low grade (CIN 1) 10/13/2016  . Thrombocytosis ([facility]) 03/16/2016  . 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 Hematuria 02/21/2015  . Vitamin D deficiency 02/21/2015  . Sterilization consult 02/21/2015  . Low back pain   . Morbid obesity ([facility])   . Hyperlipidemia   . Hypertension   . IFG (impaired fasting glucose)   . Migraines     Chelsea Miller PT, DPT Deveion Denz, SPT Chelsea Miller 06/27/2019, 5:39 PM  [facility] [address] [place], [place], [address] Phone: [phone]   Fax:  [phone]  Name: Mckenzie Lopez MRN: [id number] Date of Birth: 08/26/1985   

## 2019-06-27 NOTE — Patient Instructions (Signed)
Pt to cont with her compression sleeves for daytime and night time And lymphedema pump in the am and pm  To keep her R UE lymphedema under control jovipak breast pad and bra for thoracic lymphedema

## 2019-06-27 NOTE — Therapy (Signed)
[facility] [address] [place], [address] Phone: [phone]   Fax:  [phone]  Occupational Therapy Treatment  Patient Details  Name: Mckenzie Lopez MRN: [id number] Date of Birth: Jan 16, 1986 Referring Provider (OT): Garlan Fair   Encounter Date: 06/27/2019  OT End of Session - 06/27/19 1557    Visit Number  2    Number of Visits  3    Date for OT Re-Evaluation  07/27/19    Authorization - Visit Number  1    Authorization - Number of Visits  3    OT Start Time  0626    OT Stop Time  1508    OT Time Calculation (min)  35 min    Activity Tolerance  Patient tolerated treatment well    Behavior During Therapy  [facility] for tasks assessed/performed       Past Medical History:  Diagnosis Date  . BRCA negative 2019   BRCA with Dr. Bary Castilla, 7/20 Vistaseq panel neg with Elmo Putt Copland, PA-C  . CHF (congestive heart failure) ([place])    POSTPARTUM 2017/ RESOLVED  . Eczema   . Hemoglobin C trait ([place]) 11/29/2015  . Hyperlipidemia   . Hypertension   . IFG (impaired fasting glucose)   . Intraductal carcinoma of right breast 11/2017   ER/PR+; Her2neu neg; mets to LN  . Low back pain   . Migraines   . Morbid obesity ([place])     Past Surgical History:  Procedure Laterality Date  . AXILLARY LYMPH NODE BIOPSY Right 12/01/2017   METASTATIC MAMMARY CARCINOMA  . AXILLARY LYMPH NODE BIOPSY Left 12/01/2017   NEGATIVE FOR MALIGNANCY  . BREAST BIOPSY Right 12/01/2017   11 and 12 o'clock, INVASIVE MAMMARY CARCINOMA, ER/PR positive HER2 negative  . MASTECTOMY Right 01/05/2018    There were no vitals filed for this visit.  Subjective Assessment - 06/27/19 1554    Subjective   I seen my CA doctor today - going to change my 10 yrs drug because of my joint pain , and add magnesium for spasms, my sleeves doing ok - my mom is helping me to put on - but my L thumb hurting more - in everything I do - because I am  L handed and favoring my R arm    Pertinent History   R modified radical mastectomy with lymph node dissection; negative margins; 5 of 21 lymph nodes positive for metastatic carcinoma;01/31/18 Chemotherapy Initiated: Dose-dense doxorubicin-cyclophosphamide (ddAC) q2 weeks x 4 cycles followed by weekly paclitaxel (weekly-T).    Patient Stated Goals  I want to keep the lympedema under control     Currently in Pain?  Yes    Pain Score  9     Pain Location  Hand    Pain Orientation  Left    Pain Descriptors / Indicators  Aching;Burning;Constant;Tender;Throbbing    Pain Type  Acute pain    Pain Onset  More than a month ago    Pain Frequency  Constant          LYMPHEDEMA/ONCOLOGY QUESTIONNAIRE - 06/27/19 1446      Right Upper Extremity Lymphedema   15 cm Proximal to Olecranon Process  49.4 cm    10 cm Proximal to Olecranon Process  46.4 cm    Olecranon Process  35 cm    15 cm Proximal to Ulnar Styloid Process  32.4 cm    10 cm Proximal to Ulnar Styloid Process  28 cm  Just Proximal to Ulnar Styloid Process  22.5 cm    Across Hand at [employer]  23.7 cm    At [place] of 2nd Digit  7.8 cm    At [facility] of Thumb  7.8 cm       Pt new compression sleeve and glove maintaining her lymphedema in R UE  And using her night time sleeve - hand is more down in the am and using her pump- using pump in and and pm for about hour  And then she do have jovipak breast pad to use as needed for thoracic lymphedema  Pt to cont with Home program -need to replace daytime compression in 6-8 months    L hand and wrist pain- tenderness over distal radius and Finkelstein test positive Pain over radial wrist and forearm with trigger points  Pain with thumb PA and RA  And wrist flexion , RD, UD  Pt ask family MD for OT order to eval and treat for L DeQuervain tendinitis                 OT Education - 06/27/19 1557    Education Details  wearing of compression sleeve , night , daytime and pump  use    Person(s) Educated  Patient    Methods  Explanation;Demonstration;Handout    Comprehension  Verbalized understanding;Returned demonstration          OT Long Term Goals - 06/27/19 1602      OT LONG TERM GOAL #1   Title  Pt to show increase AROM in R shoulder to fix hair and reach into cabinets    Baseline  PT adressing    Status  Deferred      OT LONG TERM GOAL #2   Title  Pt to be independent in use of new daytime compression and night time compression sleeve to decrease and maintain R UE circumference    Baseline  From June - upper arm and elbow increase circumference  -new compression maintaining her R UE lymphedema    Status  Achieved      OT LONG TERM GOAL #3   Title  Pt scar tissue and soft tissue adhesion on R anterior chest improve for pt to report less tightness in chest with R shoulder AROM    Baseline  PT adressing    Status  Deferred            Plan - 06/27/19 1558    Clinical Impression Statement  Pt doing very well maintaining her R UE and thoracic ymphedema under control - using her [employer] Elvarex soft sleeve and glove and night time compressoin - pump in the am and pm -and jovipak under bra as needed - pt complain the last few months of Dequervain symptoms on L wrist - did provide her with prefab thumb spica before but pain cont- pt ask her family MD for OT order to eval and treat for L dequervain    OT Occupational Profile and History  Problem Focused Assessment - Including review of records relating to presenting problem    Occupational performance deficits (Please refer to evaluation for details):  IADL's;Work;Leisure;Play    Body Structure / Function / Physical Skills  ADL;Scar mobility;Edema;Sensation;UE functional use;Flexibility;ROM;Pain;Strength    Rehab Potential  Good    Clinical Decision Making  Limited treatment options, no task modification necessary    Comorbidities Affecting Occupational Performance:  May have comorbidities impacting  occupational performance    Modification or Assistance  to Complete Evaluation   Min-Moderate modification of tasks or assist with assess necessary to complete eval    OT Treatment/Interventions  Self-care/ADL training;Manual lymph drainage;Patient/family education;Therapeutic exercise;Scar mobilization;DME and/or AE instruction;Passive range of motion;Manual Therapy    Plan  pt to cont with compression garments    OT Home Exercise Plan  see pt instruction     Consulted and Agree with Plan of Care  Patient       Patient will benefit from skilled therapeutic intervention in order to improve the following deficits and impairments:   Body Structure / Function / Physical Skills: ADL, Scar mobility, Edema, Sensation, UE functional use, Flexibility, ROM, Pain, Strength       Visit Diagnosis: Postmastectomy lymphedema syndrome    Problem List Patient Active Problem List   Diagnosis Date Noted  . Lymphedema of arm 12/21/2018  . Bilateral hand numbness 12/21/2018  . GERD without esophagitis 12/21/2018  . Malignant neoplasm of upper-outer quadrant of right breast in female, estrogen receptor positive ([place]) 12/07/2017  . Allergic conjunctivitis of both eyes 09/16/2017  . Family history of breast cancer 10/13/2016  . Dysplasia of cervix, low grade (CIN 1) 10/13/2016  . Thrombocytosis ([place]) 03/16/2016  . Hemoglobin C trait ([place]) 11/29/2015  . Hematuria 02/21/2015  . Vitamin D deficiency 02/21/2015  . Sterilization consult 02/21/2015  . Low back pain   . Morbid obesity ([place])   . Hyperlipidemia   . Hypertension   . IFG (impaired fasting glucose)   . Migraines     Edwin Cherian OTR/L,CLT 06/27/2019, 4:03 PM  [facility] [address] [place], [address] Phone: [phone]   Fax:  [phone]  Name: Honesti Micca Matura MRN: [id number] Date of Birth: [birth year]

## 2019-06-29 ENCOUNTER — Encounter: Payer: Medicaid Other | Admitting: Physical Therapy

## 2019-06-30 ENCOUNTER — Ambulatory Visit: Payer: Medicaid Other | Admitting: Physical Therapy

## 2019-07-04 ENCOUNTER — Ambulatory Visit: Payer: Medicaid Other | Admitting: Physical Therapy

## 2019-07-04 ENCOUNTER — Other Ambulatory Visit: Payer: Self-pay

## 2019-07-04 ENCOUNTER — Encounter: Payer: Self-pay | Admitting: Physical Therapy

## 2019-07-04 DIAGNOSIS — M25611 Stiffness of right shoulder, not elsewhere classified: Secondary | ICD-10-CM | POA: Diagnosis not present

## 2019-07-04 DIAGNOSIS — R293 Abnormal posture: Secondary | ICD-10-CM | POA: Diagnosis not present

## 2019-07-04 DIAGNOSIS — G8929 Other chronic pain: Secondary | ICD-10-CM | POA: Diagnosis not present

## 2019-07-04 DIAGNOSIS — I972 Postmastectomy lymphedema syndrome: Secondary | ICD-10-CM | POA: Diagnosis not present

## 2019-07-04 DIAGNOSIS — M25511 Pain in right shoulder: Secondary | ICD-10-CM | POA: Diagnosis not present

## 2019-07-04 NOTE — Therapy (Addendum)
[facility] [address] [place], [address] Phone: [phone]   Fax:  [phone]  Physical Therapy Treatment  Patient Details  Name: Dalilah Analysia Dungee MRN: [id number] Date of Birth: 12-17-1985 Referring Provider (PT): [name] PA (Oncology)   Encounter Date: 07/04/2019  PT End of Session - 07/04/19 1432    Visit Number  7    Number of Visits  17    Date for PT Re-Evaluation  07/21/19    Authorization Type  [place] Medicaid    Authorization Time Period  06/16/2019-07/27/2019    Authorization - Visit Number  3    Authorization - Number of Visits  12    PT Start Time  1340    PT Stop Time  1425    PT Time Calculation (min)  45 min    Activity Tolerance  Patient tolerated treatment well    Behavior During Therapy  [facility] for tasks assessed/performed       Past Medical History:  Diagnosis Date  . BRCA negative 2019   BRCA with Dr. Bary Castilla, 7/20 Vistaseq panel neg with Elmo Putt Copland, PA-C  . CHF (congestive heart failure) ([place])    POSTPARTUM 2017/ RESOLVED  . Eczema   . Hemoglobin C trait ([place]) 11/29/2015  . Hyperlipidemia   . Hypertension   . IFG (impaired fasting glucose)   . Intraductal carcinoma of right breast 11/2017   ER/PR+; Her2neu neg; mets to LN  . Low back pain   . Migraines   . Morbid obesity ([place])     Past Surgical History:  Procedure Laterality Date  . AXILLARY LYMPH NODE BIOPSY Right 12/01/2017   METASTATIC MAMMARY CARCINOMA  . AXILLARY LYMPH NODE BIOPSY Left 12/01/2017   NEGATIVE FOR MALIGNANCY  . BREAST BIOPSY Right 12/01/2017   11 and 12 o'clock, INVASIVE MAMMARY CARCINOMA, ER/PR positive HER2 negative  . MASTECTOMY Right 01/05/2018    There were no vitals filed for this visit.  Subjective Assessment - 07/04/19 1345    Subjective  Pt feels good today with decreased neck tension, but has increased tenderness during shoulder flexion. Pt 5/10 pain currently. Pt HEP  has been going well with good carryover.    Pertinent History  Pt is a 34 year old female with hx of breast cancer and complaints of R shldr pain. Pt is s/p mastectomy and lymphectomy on 01/05/2018, chemotherapy, and radiation that brought on the R shoulder pain and stiffness. Pt reports skin irritation of the anterior pec, axillary region, and posterior shoulder pain. Pt history of periphreal neuropathy with numbess in bilat hands and feet, c/o of tingling present in R axillary and chest region. Pt reports muscle spasm sensation in upper trap, a "pulling sensation" in axillary region down to the Latissimus Dorsi, and a throbbing sensation in anterior pectoral region. Current pain is 7/10, at worst 10/10, at best 5/10. Pt reports stiffness in R shoulder upon waking and increasing pain throughout the day with activity. Aggravating factors include reaching OH or behind the back and immediate onset and 5 mins for pain to decrease. Easing factors include resting. Pt wears compression sleeve from hand to shoulder most of the day and overnight to decrease swelling. Pt struggles with ADLs including cooking, cleaning, dressing, caring for small child (3y/o daughter) and has been unable to function around the house at previous level from radiation. Pt goal is to decrease pain and increase shoulder ROM and strength for ADLs.    Limitations  Lifting;House hold activities    How long can you sit comfortably?  10-15 minutes (shoulder)    How long can you stand comfortably?  10-15 minutes (also limited by pedal neuropathy)    How long can you walk comfortably?  about 15 minutes    Diagnostic tests  No imaging completed recently    Patient Stated Goals  To complete ADLs        THEREX -Supine ABD with 2# and foam roller under scapula 3x10 for increasing AROM with t spine extension -Supine shoulder flexion and foam roller under scapula 2#  3x10 with slight hold at top of shoulder flexion moment -90 deg ABD to [facility] press  4# for [facility] strengthening with cueing for scapular retraction with OH press with good carryover; unable to get to flexion WNL bilat; R arm lagging  -Face pulls 25# 1x10, 35# 2x8 with good carryover for scapular retraction, cueing to keep from weight pulling shoulder forward   MAN THER -PROM for FLEX, ABD for 3x30 sec hold with inferior and posterior glide for increasing mobility -Grade I/II mobs inferior, lateral glides for pain reduction 3 bouts for 30 sec -Trigger point release in pec major muscle 2x15 sec holds with decreased pain noted and some decreased tension noted by PT -PT notes improved PROM after manual therapy  Pain level after session 4/10                     PT Education - 07/04/19 1431    Education Details  therex form, increasing self-massage for pec muscle tightness    Person(s) Educated  Patient    Methods  Demonstration;Explanation    Comprehension  Verbalized understanding;Returned demonstration       PT Short Term Goals - 06/09/19 0819      PT SHORT TERM GOAL #1   Title  In 4 weeks, pt will decrease pain score by 2 point in order to make progress towards demonstrating clinically significant improvement in pain levels.    Baseline  05/25/19 7/10 pain; 06/09/2019 8/10    Time  4    Period  Weeks    Status  On-going      PT SHORT TERM GOAL #2   Title  Pt will be independent with HEP in order to improve strength and balance in order to decrease fall risk and improve function at home and work.    Baseline  05/25/19 HEP given; compliant with exercise    Time  4    Period  Weeks    Status  On-going        PT Long Term Goals - 06/09/19 0820      PT LONG TERM GOAL #1   Title  In 8 weeks, pt will decrease pain score by 4 points in order to improve functional ADL capability and care for young daughter.    Baseline  05/26/19 7/10 pain; 06/09/2019 8/10 pain    Time  8    Period  Weeks    Status  On-going      PT LONG TERM GOAL #2   Title  In 8  weeks, pt will improve AROM of R shoulder WNL in order to perform personal grooming and household ADLs    Baseline  05/26/19 ER: R occiput IR R PSIS Flexion 90; Abduction 82 both painful; 06/09/2019 ER: R occiput IR PSIS Flexion 100 ABD 95 painful for flexion/ABD    Time  8    Period  Weeks  Status  On-going      PT LONG TERM GOAL #3   Title  In 8 weeks, pt will improve gross R shoulder and periscapular strength to 4+/5 MMT to perform heavy household chores and provide care to young daughter .    Baseline  05/26/19 Flexion/ABD/IR 4/5; lower trapezius 2-/5; scap retractors 3/5; I position 4/5; 06/09/2019 Flexion 4+/5, ABD 4/5, IR4/5 ER 4/5, lower trap 2+/5, middle trap 3/5, extension 4-/5    Time  8    Period  Weeks    Status  On-going      PT LONG TERM GOAL #4   Title  Patient will increase FOTO score to 60 to demonstrate predicted increase in functional mobility to complete ADLs    Baseline  05/26/19 30; 06/09/2019 34    Time  8    Period  Weeks    Status  On-going            Plan - 07/04/19 1433    Clinical Impression Statement  PT continued with manual therapy for increased ROM and pain control with good success. Pt demonstrates improved strength and periscapular coordination, but stil lacks ROM to achieve full motion. PT notes some increased tension in pec major muscle and tried to release trigger points. PT suggested self-massage as part of HEP. PT will continue with progression for strength therex and manual therapy as needed for increasing ROM and pain control.    Personal Factors and Comorbidities  Comorbidity 3+;Past/Current Experience;Time since onset of injury/illness/exacerbation;Profession    Examination-Activity Limitations  Caring for Others;Dressing;Carry;Hygiene/Grooming;Bathing    Examination-Participation Restrictions  Cleaning;Laundry;Community Activity    Stability/Clinical Decision Making  Evolving/Moderate complexity    Clinical Decision Making  Moderate     Rehab Potential  Good    PT Frequency  2x / week    PT Duration  8 weeks    PT Treatment/Interventions  ADLs/Self Care Home Management;Aquatic Therapy;Functional mobility training;Therapeutic activities;Therapeutic exercise;Manual techniques;Compression bandaging;Neuromuscular re-education    PT Next Visit Plan  mobility, parascapular strength    PT Home Exercise Plan  doorway pec stretch, supine snow angels, table slides (flex/abd), supine long direction pec stretch, lateral neck flexion stretch for upper trap    Consulted and Agree with Plan of Care  Patient       Patient will benefit from skilled therapeutic intervention in order to improve the following deficits and impairments:  Decreased activity tolerance, Decreased range of motion, Decreased strength, Hypomobility, Impaired sensation, Impaired UE functional use, Pain, Decreased mobility, Decreased scar mobility, Increased edema, Postural dysfunction  Visit Diagnosis: Chronic right shoulder pain  Stiffness of right shoulder, not elsewhere classified  Abnormal posture     Problem List Patient Active Problem List   Diagnosis Date Noted  . Lymphedema of arm 12/21/2018  . Bilateral hand numbness 12/21/2018  . GERD without esophagitis 12/21/2018  . Malignant neoplasm of upper-outer quadrant of right breast in female, estrogen receptor positive ([place]) 12/07/2017  . Allergic conjunctivitis of both eyes 09/16/2017  . Family history of breast cancer 10/13/2016  . Dysplasia of cervix, low grade (CIN 1) 10/13/2016  . Thrombocytosis ([place]) 03/16/2016  . Hemoglobin C trait ([place]) 11/29/2015  . Hematuria 02/21/2015  . Vitamin D deficiency 02/21/2015  . Sterilization consult 02/21/2015  . Low back pain   . Morbid obesity ([place])   . Hyperlipidemia   . Hypertension   . IFG (impaired fasting glucose)   . Migraines    Shelton Silvas PT, DPT Ivin Booty, SPT [place]  Sabra Heck 07/04/2019, 6:14 PM  [facility] [address], [place], [address] Phone: [phone]   Fax:  [phone]  Name: Paizlee Taresa [place] MRN: [id number] Date of Birth: [birth year]

## 2019-07-10 ENCOUNTER — Encounter: Payer: Self-pay | Admitting: Physical Therapy

## 2019-07-10 ENCOUNTER — Other Ambulatory Visit: Payer: Self-pay

## 2019-07-10 ENCOUNTER — Ambulatory Visit: Payer: Medicaid Other | Attending: Physician Assistant | Admitting: Physical Therapy

## 2019-07-10 ENCOUNTER — Encounter: Payer: Self-pay | Admitting: Occupational Therapy

## 2019-07-10 ENCOUNTER — Ambulatory Visit: Payer: Medicaid Other | Admitting: Occupational Therapy

## 2019-07-10 DIAGNOSIS — M6281 Muscle weakness (generalized): Secondary | ICD-10-CM

## 2019-07-10 DIAGNOSIS — R293 Abnormal posture: Secondary | ICD-10-CM | POA: Diagnosis not present

## 2019-07-10 DIAGNOSIS — M25611 Stiffness of right shoulder, not elsewhere classified: Secondary | ICD-10-CM | POA: Diagnosis not present

## 2019-07-10 DIAGNOSIS — M25632 Stiffness of left wrist, not elsewhere classified: Secondary | ICD-10-CM | POA: Insufficient documentation

## 2019-07-10 DIAGNOSIS — M25532 Pain in left wrist: Secondary | ICD-10-CM | POA: Diagnosis not present

## 2019-07-10 DIAGNOSIS — I972 Postmastectomy lymphedema syndrome: Secondary | ICD-10-CM | POA: Insufficient documentation

## 2019-07-10 DIAGNOSIS — M5441 Lumbago with sciatica, right side: Secondary | ICD-10-CM | POA: Diagnosis not present

## 2019-07-10 DIAGNOSIS — M25511 Pain in right shoulder: Secondary | ICD-10-CM | POA: Insufficient documentation

## 2019-07-10 DIAGNOSIS — G8929 Other chronic pain: Secondary | ICD-10-CM | POA: Diagnosis not present

## 2019-07-10 NOTE — Therapy (Signed)
[facility] [address], [place], [address] Phone: [phone]   Fax:  [phone]  Physical Therapy Treatment  Patient Details  Name: Mckenzie Lopez MRN: [id number] Date of Birth: 1986-01-18 Referring Provider (PT): [name] PA (Oncology)   Encounter Date: 07/10/2019  PT End of Session - 07/10/19 1137    Visit Number  8    Number of Visits  17    Date for PT Re-Evaluation  07/21/19    Authorization Type  [place] Medicaid    Authorization Time Period  06/16/2019-07/27/2019    Authorization - Visit Number  4    Authorization - Number of Visits  12    PT Start Time  1115    PT Stop Time  1200    PT Time Calculation (min)  45 min    Activity Tolerance  Patient tolerated treatment well    Behavior During Therapy  [facility] for tasks assessed/performed       Past Medical History:  Diagnosis Date  . BRCA negative 2019   BRCA with Dr. Bary Castilla, 7/20 Vistaseq panel neg with Elmo Putt Copland, PA-C  . CHF (congestive heart failure) ([place])    POSTPARTUM 2017/ RESOLVED  . Eczema   . Hemoglobin C trait ([place]) 11/29/2015  . Hyperlipidemia   . Hypertension   . IFG (impaired fasting glucose)   . Intraductal carcinoma of right breast 11/2017   ER/PR+; Her2neu neg; mets to LN  . Low back pain   . Migraines   . Morbid obesity ([place])     Past Surgical History:  Procedure Laterality Date  . AXILLARY LYMPH NODE BIOPSY Right 12/01/2017   METASTATIC MAMMARY CARCINOMA  . AXILLARY LYMPH NODE BIOPSY Left 12/01/2017   NEGATIVE FOR MALIGNANCY  . BREAST BIOPSY Right 12/01/2017   11 and 12 o'clock, INVASIVE MAMMARY CARCINOMA, ER/PR positive HER2 negative  . MASTECTOMY Right 01/05/2018    There were no vitals filed for this visit.  Subjective Assessment - 07/10/19 1121    Subjective  Patient reports her shoulder pain is 4/10, and that her shoulder ROM is continuing to improve.    Pertinent History  Pt is a 34-year  old female with hx of breast cancer and complaints of R shldr pain. Pt is s/p mastectomy and lymphectomy on 01/05/2018, chemotherapy, and radiation that brought on the R shoulder pain and stiffness. Pt reports skin irritation of the anterior pec, axillary region, and posterior shoulder pain. Pt history of periphreal neuropathy with numbess in bilat hands and feet, c/o of tingling present in R axillary and chest region. Pt reports muscle spasm sensation in upper trap, a "pulling sensation" in axillary region down to the Latissimus Dorsi, and a throbbing sensation in anterior pectoral region. Current pain is 7/10, at worst 10/10, at best 5/10. Pt reports stiffness in R shoulder upon waking and increasing pain throughout the day with activity. Aggravating factors include reaching OH or behind the back and immediate onset and 5 mins for pain to decrease. Easing factors include resting. Pt wears compression sleeve from hand to shoulder most of the day and overnight to decrease swelling. Pt struggles with ADLs including cooking, cleaning, dressing, caring for small child (3y/o daughter) and has been unable to function around the house at previous level from radiation. Pt goal is to decrease pain and increase shoulder ROM and strength for ADLs.    Limitations  Lifting;House hold activities    How long can you sit comfortably?  10-15 minutes (shoulder)    How long can you stand comfortably?  10-15 minutes (also limited by pedal neuropathy)    How long can you walk comfortably?  about 15 minutes    Diagnostic tests  No imaging completed recently    Patient Stated Goals  To complete ADLs       THEREX -Sidelying ABD with 2# -Supine shoulder flexion and foam roller under scapula 2#  3x10 with slight hold at top of shoulder flexion moment - Scaption 3# bilat with cuing for posture and scapulohumeral rhythm with good carry over -Face pulls 25# 1x10, 35# 2x8 with good carryover for scapular retraction, cueing to keep  from weight pulling shoulder forward   MAN THER -PROM for FLEX, ABD for 3x30 sec hold with inferior and posterior glide for increasing mobility -Grade I/II mobs inferior, lateral glides for pain reduction 3 bouts for 30 sec -Trigger point release in pec major muscle 2x15 sec holds with decreased pain noted and some decreased tension noted by PT -PT notes improved PROM after manual therapy Pain level after session 4/10                         PT Education - 07/10/19 1137    Education Details  therex form    Person(s) Educated  Patient    Methods  Explanation;Demonstration;Verbal cues;Tactile cues    Comprehension  Verbalized understanding;Returned demonstration;Verbal cues required;Tactile cues required       PT Short Term Goals - 06/09/19 0819      PT SHORT TERM GOAL #1   Title  In 4 weeks, pt will decrease pain score by 2 point in order to make progress towards demonstrating clinically significant improvement in pain levels.    Baseline  05/25/19 7/10 pain; 06/09/2019 8/10    Time  4    Period  Weeks    Status  On-going      PT SHORT TERM GOAL #2   Title  Pt will be independent with HEP in order to improve strength and balance in order to decrease fall risk and improve function at home and work.    Baseline  05/25/19 HEP given; compliant with exercise    Time  4    Period  Weeks    Status  On-going        PT Long Term Goals - 06/09/19 0820      PT LONG TERM GOAL #1   Title  In 8 weeks, pt will decrease pain score by 4 points in order to improve functional ADL capability and care for young daughter.    Baseline  05/26/19 7/10 pain; 06/09/2019 8/10 pain    Time  8    Period  Weeks    Status  On-going      PT LONG TERM GOAL #2   Title  In 8 weeks, pt will improve AROM of R shoulder WNL in order to perform personal grooming and household ADLs    Baseline  05/26/19 ER: R occiput IR R PSIS Flexion 90; Abduction 82 both painful; 06/09/2019 ER: R occiput IR  PSIS Flexion 100 ABD 95 painful for flexion/ABD    Time  8    Period  Weeks    Status  On-going      PT LONG TERM GOAL #3   Title  In 8 weeks, pt will improve gross R shoulder and periscapular strength to 4+/5 MMT to perform heavy household chores and provide care  to young daughter .    Baseline  05/26/19 Flexion/ABD/IR 4/5; lower trapezius 2-/5; scap retractors 3/5; I position 4/5; 06/09/2019 Flexion 4+/5, ABD 4/5, IR4/5 ER 4/5, lower trap 2+/5, middle trap 3/5, extension 4-/5    Time  8    Period  Weeks    Status  On-going      PT LONG TERM GOAL #4   Title  Patient will increase FOTO score to 60 to demonstrate predicted increase in functional mobility to complete ADLs    Baseline  05/26/19 30; 06/09/2019 34    Time  8    Period  Weeks    Status  On-going            Plan - 07/10/19 1141    Clinical Impression Statement  PT continued to utilize manual techniques with increased motion and decreased pain following. PT continued therex progression for scauplo-humeral rhythm and strenghtening with good success. Pt is able to comply with all cuing for proper technique with noted muscle fatigue at the end of sets. PT will continue progression as able.    Personal Factors and Comorbidities  Comorbidity 3+;Past/Current Experience;Time since onset of injury/illness/exacerbation;Profession    Examination-Activity Limitations  Caring for Others;Dressing;Carry;Hygiene/Grooming;Bathing    Examination-Participation Restrictions  Cleaning;Laundry;Community Activity    Stability/Clinical Decision Making  Evolving/Moderate complexity    Clinical Decision Making  Moderate    Rehab Potential  Good    PT Frequency  2x / week    PT Duration  8 weeks    PT Treatment/Interventions  ADLs/Self Care Home Management;Aquatic Therapy;Functional mobility training;Therapeutic activities;Therapeutic exercise;Manual techniques;Compression bandaging;Neuromuscular re-education    PT Next Visit Plan  mobility,  parascapular strength    PT Home Exercise Plan  doorway pec stretch, supine snow angels, table slides (flex/abd), supine long direction pec stretch, lateral neck flexion stretch for upper trap    Consulted and Agree with Plan of Care  Patient       Patient will benefit from skilled therapeutic intervention in order to improve the following deficits and impairments:  Decreased activity tolerance, Decreased range of motion, Decreased strength, Hypomobility, Impaired sensation, Impaired UE functional use, Pain, Decreased mobility, Decreased scar mobility, Increased edema, Postural dysfunction  Visit Diagnosis: Chronic right shoulder pain  Stiffness of right shoulder, not elsewhere classified  Abnormal posture     Problem List Patient Active Problem List   Diagnosis Date Noted  . Lymphedema of arm 12/21/2018  . Bilateral hand numbness 12/21/2018  . GERD without esophagitis 12/21/2018  . Malignant neoplasm of upper-outer quadrant of right breast in female, estrogen receptor positive ([place]) 12/07/2017  . Allergic conjunctivitis of both eyes 09/16/2017  . Family history of breast cancer 10/13/2016  . Dysplasia of cervix, low grade (CIN 1) 10/13/2016  . Thrombocytosis ([place]) 03/16/2016  . Hemoglobin C trait ([place]) 11/29/2015  . Hematuria 02/21/2015  . Vitamin D deficiency 02/21/2015  . Sterilization consult 02/21/2015  . Low back pain   . Morbid obesity ([place])   . Hyperlipidemia   . Hypertension   . IFG (impaired fasting glucose)   . Migraines    Shelton Silvas PT, DPT Shelton Silvas 07/10/2019, 12:15 PM  [facility] [address], [place], [address] Phone: [phone]   Fax:  [phone]  Name: Mckenzie Lopez MRN: [id number] Date of Birth: [birth year]

## 2019-07-10 NOTE — Patient Instructions (Signed)
Contrast or heat 2 x day AROM pain free for wrist in all planes  And thumb including opposition 10 reps  Thumb Spica on all the time - off for ADL's and HEP  To decrease pain  Several times during day - ice massage over 1st dorsal compartment

## 2019-07-10 NOTE — Therapy (Signed)
[facility] [address] [place], [address] Phone: [phone]   Fax:  [phone]  Occupational Therapy Evaluation  Patient Details  Name: Mckenzie Lopez MRN: [id number] Date of Birth: [birth year] Referring Provider (OT): Delsa Grana PA   Encounter Date: 07/10/2019  OT End of Session - 07/10/19 1740    Visit Number  1    Number of Visits  10    Date for OT Re-Evaluation  08/21/19    OT Start Time  0950    OT Stop Time  1049    OT Time Calculation (min)  59 min    Activity Tolerance  Patient tolerated treatment well    Behavior During Therapy  [facility] for tasks assessed/performed       Past Medical History:  Diagnosis Date  . BRCA negative 2019   BRCA with Dr. Bary Castilla, 7/20 Vistaseq panel neg with Elmo Putt Copland, PA-C  . CHF (congestive heart failure) ([place])    POSTPARTUM 2017/ RESOLVED  . Eczema   . Hemoglobin C trait ([place]) 11/29/2015  . Hyperlipidemia   . Hypertension   . IFG (impaired fasting glucose)   . Intraductal carcinoma of right breast 11/2017   ER/PR+; Her2neu neg; mets to LN  . Low back pain   . Migraines   . Morbid obesity ([place])     Past Surgical History:  Procedure Laterality Date  . AXILLARY LYMPH NODE BIOPSY Right 12/01/2017   METASTATIC MAMMARY CARCINOMA  . AXILLARY LYMPH NODE BIOPSY Left 12/01/2017   NEGATIVE FOR MALIGNANCY  . BREAST BIOPSY Right 12/01/2017   11 and 12 o'clock, INVASIVE MAMMARY CARCINOMA, ER/PR positive HER2 negative  . MASTECTOMY Right 01/05/2018    There were no vitals filed for this visit.  Subjective Assessment - 07/10/19 1728    Subjective   MY L wrist pain started in early January - and getting worse - I cannot pick up , hold objects , squeeze, doing my daughters hair , putting or puling up my compression sleeves    Pertinent History  R modified radical mastectomy with lymph node dissection; negative margins; 5 of 21 lymph nodes positive for  metastatic carcinoma;01/31/18 Chemotherapy Initiated: Dose-dense doxorubicin-cyclophosphamide (ddAC) q2 weeks x 4 cycles followed by weekly paclitaxel (weekly-T).had radiation - then developed neuropathy form chemo - harder time doing thinkgs with her hand - tendinitis started in January 2021 in L wrist - pulling up her compression sleeve for daytime and night time , pump - doing her daughers hair    Patient Stated Goals  I want the pain better in my L wrist so I can use it to hold , pick up , squeez objects , do my daughters hair, put on my clothes and compression garments    Currently in Pain?  Yes    Pain Score  10-Worst pain ever    Pain Location  Wrist    Pain Orientation  Left    Pain Descriptors / Indicators  Aching;Tender;Throbbing;Shooting;Sharp    Pain Type  Acute pain    Pain Onset  More than a month ago    Pain Frequency  Constant        [facility] OT Assessment - 07/10/19 0001      Assessment   Medical Diagnosis  L deQuervain    Referring Provider (OT)  Delsa Grana PA    Onset Date/Surgical Date  05/12/19    Hand Dominance  Left    Prior Therapy  --  R lymphedema      Precautions   Precaution Comments  --   R UE and thoracic lymphedema   Required Braces or Orthoses  --   L thumb spica      Balance Screen   Has the patient fallen in the past 6 months  No      Prior Function   Vocation  On disability    Leisure  watch tv, play with her 3 yrs daughter , some house work       AROM   Right Wrist Extension  60 Degrees    Right Wrist Flexion  80 Degrees    Right Wrist Radial Deviation  20 Degrees    Right Wrist Ulnar Deviation  30 Degrees    Left Wrist Extension  60 Degrees   pain 10/10   Left Wrist Flexion  80 Degrees   pain 10/10    Left Wrist Radial Deviation  20 Degrees    Left Wrist Ulnar Deviation  30 Degrees   pain 10/10      Strength   Right Hand Grip (lbs)  55    Right Hand Lateral Pinch  15 lbs    Right Hand 3 Point Pinch  17 lbs    Left Hand Grip (lbs)   20    pain 10/10   Left Hand Lateral Pinch  8 lbs   10/10 pain    Left Hand 3 Point Pinch  6 lbs   pain 10/10        fabricated custom thumb spica for pt to wear all the time  Off for    Contrast or heat 2 x day AROM pain free for wrist in all planes  And thumb including opposition 10 reps  Thumb Spica on all the time - off for ADL's and HEP  To decrease pain  Several times during day - ice massage over 1st dorsal co  ionto done this date 2 current small patch 19 min over L 1st dorsal compartment - skin check done prior and after wards -and no issues            OT Education - 07/10/19 1739    Education Details  findings of eval and HEP    Person(s) Educated  Patient    Methods  Explanation;Demonstration;Handout    Comprehension  Verbalized understanding;Returned demonstration       OT Short Term Goals - 07/10/19 1746      OT SHORT TERM GOAL #1   Title  Pain on PRWHE improve with more than 20 points    Baseline  at eval PRWHE for pain 46/50    Time  3    Period  Weeks    Status  New    Target Date  07/31/19      OT SHORT TERM GOAL #2   Title  Pt to be independent in use of splint and HEP to decrease pain in L wrist    Baseline  pain 6-10/10 - PRWHE 46/50 - had pain in over the counter sleeve- now custom thumb spica    Time  2    Period  Weeks    Status  New    Target Date  07/24/19        OT Long Term Goals - 07/10/19 1748      OT LONG TERM GOAL #1   Title  Pain in L wrist increase for pt to wean out of custom thumb spica    Baseline  Pain on PRWHE 46/50 ., and splint all the time    Time  5    Period  Weeks    Status  New    Target Date  08/14/19      OT LONG TERM GOAL #2   Title  Pt L wrist AROM in all planes increase for pt to have not increase symptoms more than 2/10    Baseline  pain 6-10/10 , PRWHE 46/50 - at eval and increase pain with AROM and grip/prehension with use -    Time  6    Period  Weeks    Status  New    Target Date   08/21/19            Plan - 07/10/19 1741    Clinical Impression Statement  Pt is well known to this OT for her R UE and thoracic lymphedema related to her R breast CA, mastectomy with chemo and radiation - pt developed neuropathy in bilateral feet and hands - and wear full time day and night time compression garments on her R UE - she develped L DeQuervain early January per pt - pt pain 6-10/10  and getting worse gradually - having hard time using her L hand in pulling on her sleeves , pull up her pants and socks, fastening things ,doing her daughters hair ,cook , holding book - pt can benefit from OT services to decrease pain and increas ROM and strenght in L dominant hand / wrist    OT Occupational Profile and History  Problem Focused Assessment - Including review of records relating to presenting problem    Occupational performance deficits (Please refer to evaluation for details):  IADL's;Work;Leisure;Play;ADL's    Body Structure / Function / Physical Skills  ADL;IADL;Pain;Strength;ROM;UE functional use;Dexterity    Rehab Potential  Good    Clinical Decision Making  Limited treatment options, no task modification necessary    Comorbidities Affecting Occupational Performance:  May have comorbidities impacting occupational performance   chemo induce neuropathy   Modification or Assistance to Complete Evaluation   Min-Moderate modification of tasks or assist with assess necessary to complete eval    OT Frequency  2x / week    OT Duration  6 weeks   5 wks   OT Treatment/Interventions  Self-care/ADL training;Patient/family education;Therapeutic exercise;DME and/or AE instruction;Passive range of motion;Manual Therapy;Iontophoresis;Contrast Bath;Splinting;Cryotherapy    Plan  assess pain decrease with splint use and ionto    OT Home Exercise Plan  see pt instruction     Consulted and Agree with Plan of Care  Patient       Patient will benefit from skilled therapeutic intervention in order  to improve the following deficits and impairments:   Body Structure / Function / Physical Skills: ADL, IADL, Pain, Strength, ROM, UE functional use, Dexterity       Visit Diagnosis: Pain in left wrist - Plan: Ot plan of care cert/re-cert  Stiffness of left wrist, not elsewhere classified - Plan: Ot plan of care cert/re-cert  Muscle weakness (generalized) - Plan: Ot plan of care cert/re-cert    Problem List Patient Active Problem List   Diagnosis Date Noted  . Lymphedema of arm 12/21/2018  . Bilateral hand numbness 12/21/2018  . GERD without esophagitis 12/21/2018  . Malignant neoplasm of upper-outer quadrant of right breast in female, estrogen receptor positive ([place]) 12/07/2017  . Allergic conjunctivitis of both eyes 09/16/2017  . Family history of breast cancer 10/13/2016  . Dysplasia of cervix, low grade (  CIN 1) 10/13/2016  . Thrombocytosis ([place]) 03/16/2016  . Hemoglobin C trait ([place]) 11/29/2015  . Hematuria 02/21/2015  . Vitamin D deficiency 02/21/2015  . Sterilization consult 02/21/2015  . Low back pain   . Morbid obesity ([place])   . Hyperlipidemia   . Hypertension   . IFG (impaired fasting glucose)   . Migraines     Yamaris Cummings OTR/L,CLT 07/10/2019, 5:52 PM  [facility] [address] [place], [address] Phone: [phone]   Fax:  [phone]  Name: Zaryah Ahmya Bernick MRN: [id number] Date of Birth: Jun 30, 1985

## 2019-07-12 ENCOUNTER — Other Ambulatory Visit: Payer: Self-pay

## 2019-07-12 ENCOUNTER — Ambulatory Visit: Payer: Medicaid Other | Admitting: Physical Therapy

## 2019-07-12 ENCOUNTER — Encounter: Payer: Self-pay | Admitting: Physical Therapy

## 2019-07-12 DIAGNOSIS — M25611 Stiffness of right shoulder, not elsewhere classified: Secondary | ICD-10-CM | POA: Diagnosis not present

## 2019-07-12 DIAGNOSIS — M25532 Pain in left wrist: Secondary | ICD-10-CM | POA: Diagnosis not present

## 2019-07-12 DIAGNOSIS — I972 Postmastectomy lymphedema syndrome: Secondary | ICD-10-CM | POA: Diagnosis not present

## 2019-07-12 DIAGNOSIS — M25511 Pain in right shoulder: Secondary | ICD-10-CM | POA: Diagnosis not present

## 2019-07-12 DIAGNOSIS — M6281 Muscle weakness (generalized): Secondary | ICD-10-CM | POA: Diagnosis not present

## 2019-07-12 DIAGNOSIS — R293 Abnormal posture: Secondary | ICD-10-CM | POA: Diagnosis not present

## 2019-07-12 DIAGNOSIS — G8929 Other chronic pain: Secondary | ICD-10-CM

## 2019-07-12 DIAGNOSIS — M25632 Stiffness of left wrist, not elsewhere classified: Secondary | ICD-10-CM | POA: Diagnosis not present

## 2019-07-12 DIAGNOSIS — M5441 Lumbago with sciatica, right side: Secondary | ICD-10-CM | POA: Diagnosis not present

## 2019-07-12 NOTE — Therapy (Signed)
[facility] [address], [place], [address] Phone: [phone]   Fax:  [phone]  Physical Therapy Treatment  Patient Details  Name: Mckenzie Lopez MRN: [id number] Date of Birth: August 03, 1985 Referring Provider (PT): [name] PA (Oncology)   Encounter Date: 07/12/2019  PT End of Session - 07/12/19 1142    Visit Number  9    Number of Visits  17    Date for PT Re-Evaluation  07/21/19    Authorization Type  [place] Medicaid    Authorization Time Period  06/16/2019-07/27/2019    Authorization - Visit Number  5    Authorization - Number of Visits  12    PT Start Time  1115    PT Stop Time  1156    PT Time Calculation (min)  41 min    Equipment Utilized During Treatment  Other (comment)    Activity Tolerance  Patient tolerated treatment well    Behavior During Therapy  [facility] for tasks assessed/performed       Past Medical History:  Diagnosis Date  . BRCA negative 2019   BRCA with Dr. Bary Castilla, 7/20 Vistaseq panel neg with Elmo Putt Copland, PA-C  . CHF (congestive heart failure) ([place])    POSTPARTUM 2017/ RESOLVED  . Eczema   . Hemoglobin C trait ([place]) 11/29/2015  . Hyperlipidemia   . Hypertension   . IFG (impaired fasting glucose)   . Intraductal carcinoma of right breast 11/2017   ER/PR+; Her2neu neg; mets to LN  . Low back pain   . Migraines   . Morbid obesity ([place])     Past Surgical History:  Procedure Laterality Date  . AXILLARY LYMPH NODE BIOPSY Right 12/01/2017   METASTATIC MAMMARY CARCINOMA  . AXILLARY LYMPH NODE BIOPSY Left 12/01/2017   NEGATIVE FOR MALIGNANCY  . BREAST BIOPSY Right 12/01/2017   11 and 12 o'clock, INVASIVE MAMMARY CARCINOMA, ER/PR positive HER2 negative  . MASTECTOMY Right 01/05/2018    There were no vitals filed for this visit.  Subjective Assessment - 07/12/19 1120    Subjective  Reports her pain is better, but she feels like her stiffness is coming from  her scar tissue.    Pertinent History  Pt is a 34 year old female with hx of breast cancer and complaints of R shldr pain. Pt is s/p mastectomy and lymphectomy on 01/05/2018, chemotherapy, and radiation that brought on the R shoulder pain and stiffness. Pt reports skin irritation of the anterior pec, axillary region, and posterior shoulder pain. Pt history of periphreal neuropathy with numbess in bilat hands and feet, c/o of tingling present in R axillary and chest region. Pt reports muscle spasm sensation in upper trap, a "pulling sensation" in axillary region down to the Latissimus Dorsi, and a throbbing sensation in anterior pectoral region. Current pain is 7/10, at worst 10/10, at best 5/10. Pt reports stiffness in R shoulder upon waking and increasing pain throughout the day with activity. Aggravating factors include reaching OH or behind the back and immediate onset and 5 mins for pain to decrease. Easing factors include resting. Pt wears compression sleeve from hand to shoulder most of the day and overnight to decrease swelling. Pt struggles with ADLs including cooking, cleaning, dressing, caring for small child (3y/o daughter) and has been unable to function around the house at previous level from radiation. Pt goal is to decrease pain and increase shoulder ROM and strength for ADLs.    How long can  you sit comfortably?  10-15 minutes (shoulder)    How long can you stand comfortably?  10-15 minutes (also limited by pedal neuropathy)    How long can you walk comfortably?  about 15 minutes    Diagnostic tests  No imaging completed recently    Patient Stated Goals  To complete ADLs    Pain Onset  More than a month ago        [place] -Sidelying ABD with 3# 3x 10 with min cuing to maintain scapular contact with mat table with good carry over -Supine shoulder flexion and foam roller under scapula 3# 3x10 with slight hold at top of shoulder flexion moment - Seated military press 3# 2x 10 with min  cuing to maintain scapular retraction with good carry over - Seated lat stretch 43mn hold  MAN THER -PROM for FLEX, ABD for 3x30 sec hold with inferior and posterior glide for increasing mobility -Grade I/II mobs inferior, lateral glides for pain reduction 3 bouts for 30 sec -Trigger point release in pec major muscle 2x15 sec holds with decreased pain noted and some decreased tension noted by PT STM to scar incision 575ms                        PT Education - 07/12/19 1142    Education Details  therex form    Person(s) Educated  Patient    Methods  Explanation;Demonstration;Tactile cues;Verbal cues    Comprehension  Verbalized understanding;Returned demonstration;Verbal cues required;Tactile cues required       PT Short Term Goals - 06/09/19 0819      PT SHORT TERM GOAL #1   Title  In 4 weeks, pt will decrease pain score by 2 point in order to make progress towards demonstrating clinically significant improvement in pain levels.    Baseline  05/25/19 7/10 pain; 06/09/2019 8/10    Time  4    Period  Weeks    Status  On-going      PT SHORT TERM GOAL #2   Title  Pt will be independent with HEP in order to improve strength and balance in order to decrease fall risk and improve function at home and work.    Baseline  05/25/19 HEP given; compliant with exercise    Time  4    Period  Weeks    Status  On-going        PT Long Term Goals - 06/09/19 0820      PT LONG TERM GOAL #1   Title  In 8 weeks, pt will decrease pain score by 4 points in order to improve functional ADL capability and care for young daughter.    Baseline  05/26/19 7/10 pain; 06/09/2019 8/10 pain    Time  8    Period  Weeks    Status  On-going      PT LONG TERM GOAL #2   Title  In 8 weeks, pt will improve AROM of R shoulder WNL in order to perform personal grooming and household ADLs    Baseline  05/26/19 ER: R occiput IR R PSIS Flexion 90; Abduction 82 both painful; 06/09/2019 ER: R occiput  IR PSIS Flexion 100 ABD 95 painful for flexion/ABD    Time  8    Period  Weeks    Status  On-going      PT LONG TERM GOAL #3   Title  In 8 weeks, pt will improve gross R shoulder and periscapular strength  to 4+/5 MMT to perform heavy household chores and provide care to young daughter .    Baseline  05/26/19 Flexion/ABD/IR 4/5; lower trapezius 2-/5; scap retractors 3/5; I position 4/5; 06/09/2019 Flexion 4+/5, ABD 4/5, IR4/5 ER 4/5, lower trap 2+/5, middle trap 3/5, extension 4-/5    Time  8    Period  Weeks    Status  On-going      PT LONG TERM GOAL #4   Title  Patient will increase FOTO score to 60 to demonstrate predicted increase in functional mobility to complete ADLs    Baseline  05/26/19 30; 06/09/2019 34    Time  8    Period  Weeks    Status  On-going            Plan - 07/12/19 1156    Clinical Impression Statement  PT utilized scar tissue massage to increase motion with good success. Patient with increased motion and reports no pain following. PT continued therex progression with addition of lat stretch, added to HEP. PT will continue progression as able.    Personal Factors and Comorbidities  Comorbidity 3+;Past/Current Experience;Time since onset of injury/illness/exacerbation;Profession    Examination-Activity Limitations  Caring for Others;Dressing;Carry;Hygiene/Grooming;Bathing    Examination-Participation Restrictions  Cleaning;Laundry;Community Activity    Stability/Clinical Decision Making  Evolving/Moderate complexity    Clinical Decision Making  Moderate    Rehab Potential  Good    PT Frequency  2x / week    PT Duration  8 weeks    PT Treatment/Interventions  ADLs/Self Care Home Management;Aquatic Therapy;Functional mobility training;Therapeutic activities;Therapeutic exercise;Manual techniques;Compression bandaging;Neuromuscular re-education    PT Next Visit Plan  mobility, parascapular strength    PT Home Exercise Plan  doorway pec stretch, supine snow angels,  table slides (flex/abd), supine long direction pec stretch, lateral neck flexion stretch for upper trap    Consulted and Agree with Plan of Care  Patient       Patient will benefit from skilled therapeutic intervention in order to improve the following deficits and impairments:  Decreased activity tolerance, Decreased range of motion, Decreased strength, Hypomobility, Impaired sensation, Impaired UE functional use, Pain, Decreased mobility, Decreased scar mobility, Increased edema, Postural dysfunction  Visit Diagnosis: Muscle weakness (generalized)  Chronic right shoulder pain  Stiffness of right shoulder, not elsewhere classified     Problem List Patient Active Problem List   Diagnosis Date Noted  . Lymphedema of arm 12/21/2018  . Bilateral hand numbness 12/21/2018  . GERD without esophagitis 12/21/2018  . Malignant neoplasm of upper-outer quadrant of right breast in female, estrogen receptor positive ([place]) 12/07/2017  . Allergic conjunctivitis of both eyes 09/16/2017  . Family history of breast cancer 10/13/2016  . Dysplasia of cervix, low grade (CIN 1) 10/13/2016  . Thrombocytosis ([place]) 03/16/2016  . Hemoglobin C trait ([place]) 11/29/2015  . Hematuria 02/21/2015  . Vitamin D deficiency 02/21/2015  . Sterilization consult 02/21/2015  . Low back pain   . Morbid obesity ([place])   . Hyperlipidemia   . Hypertension   . IFG (impaired fasting glucose)   . Migraines    Shelton Silvas PT, DPT Shelton Silvas 07/12/2019, 12:00 PM  [facility] [address], [place], [address] Phone: [phone]   Fax:  [phone]  Name: Felipa Anzley Dibbern MRN: [id number] Date of Birth: [birth year]

## 2019-07-14 ENCOUNTER — Encounter: Payer: Self-pay | Admitting: Occupational Therapy

## 2019-07-14 ENCOUNTER — Other Ambulatory Visit: Payer: Self-pay

## 2019-07-14 ENCOUNTER — Ambulatory Visit: Payer: Medicaid Other | Admitting: Occupational Therapy

## 2019-07-14 DIAGNOSIS — I972 Postmastectomy lymphedema syndrome: Secondary | ICD-10-CM | POA: Diagnosis not present

## 2019-07-14 DIAGNOSIS — M25532 Pain in left wrist: Secondary | ICD-10-CM | POA: Diagnosis not present

## 2019-07-14 DIAGNOSIS — M25511 Pain in right shoulder: Secondary | ICD-10-CM | POA: Diagnosis not present

## 2019-07-14 DIAGNOSIS — M25632 Stiffness of left wrist, not elsewhere classified: Secondary | ICD-10-CM

## 2019-07-14 DIAGNOSIS — M6281 Muscle weakness (generalized): Secondary | ICD-10-CM

## 2019-07-14 DIAGNOSIS — R293 Abnormal posture: Secondary | ICD-10-CM | POA: Diagnosis not present

## 2019-07-14 DIAGNOSIS — G8929 Other chronic pain: Secondary | ICD-10-CM | POA: Diagnosis not present

## 2019-07-14 DIAGNOSIS — M25611 Stiffness of right shoulder, not elsewhere classified: Secondary | ICD-10-CM | POA: Diagnosis not present

## 2019-07-14 DIAGNOSIS — M5441 Lumbago with sciatica, right side: Secondary | ICD-10-CM | POA: Diagnosis not present

## 2019-07-14 NOTE — Therapy (Signed)
[facility] [address] [place], [address] Phone: [phone]   Fax:  [phone]  Occupational Therapy Treatment  Patient Details  Name: Mckenzie Lopez MRN: [id number] Date of Birth: [birth year] Referring Provider (OT): Delsa Grana PA   Encounter Date: 07/14/2019  OT End of Session - 07/14/19 [place]    Visit Number  2    Number of Visits  10    Date for OT Re-Evaluation  08/21/19    Authorization - Visit Number  1    Authorization - Number of Visits  11    OT Start Time  0915    OT Stop Time  1005    OT Time Calculation (min)  50 min    Activity Tolerance  Patient tolerated treatment well    Behavior During Therapy  [facility] for tasks assessed/performed       Past Medical History:  Diagnosis Date  . BRCA negative 2019   BRCA with Dr. Bary Castilla, 7/20 Vistaseq panel neg with Elmo Putt Copland, PA-C  . CHF (congestive heart failure) ([place])    POSTPARTUM 2017/ RESOLVED  . Eczema   . Hemoglobin C trait ([place]) 11/29/2015  . Hyperlipidemia   . Hypertension   . IFG (impaired fasting glucose)   . Intraductal carcinoma of right breast 11/2017   ER/PR+; Her2neu neg; mets to LN  . Low back pain   . Migraines   . Morbid obesity ([place])     Past Surgical History:  Procedure Laterality Date  . AXILLARY LYMPH NODE BIOPSY Right 12/01/2017   METASTATIC MAMMARY CARCINOMA  . AXILLARY LYMPH NODE BIOPSY Left 12/01/2017   NEGATIVE FOR MALIGNANCY  . BREAST BIOPSY Right 12/01/2017   11 and 12 o'clock, INVASIVE MAMMARY CARCINOMA, ER/PR positive HER2 negative  . MASTECTOMY Right 01/05/2018    There were no vitals filed for this visit.  Subjective Assessment - 07/14/19 0914    Subjective   Patient reports she has pain with rotation and thumb extension.  7/10 not as sore to the touch now.    Pertinent History  R modified radical mastectomy with lymph node dissection; negative margins; 5 of 21 lymph nodes positive for  metastatic carcinoma;01/31/18 Chemotherapy Initiated: Dose-dense doxorubicin-cyclophosphamide (ddAC) q2 weeks x 4 cycles followed by weekly paclitaxel (weekly-T).had radiation - then developed neuropathy form chemo - harder time doing thinkgs with her hand - tendinitis started in January 2021 in L wrist - pulling up her compression sleeve for daytime and night time , pump - doing her daughers hair    Patient Stated Goals  I want the pain better in my L wrist so I can use it to hold , pick up , squeez objects , do my daughters hair, put on my clothes and compression garments    Currently in Pain?  Yes    Pain Score  7     Pain Location  Wrist    Pain Orientation  Left    Pain Descriptors / Indicators  Tender;Shooting    Pain Type  Acute pain    Pain Onset  More than a month ago    Pain Frequency  Constant    Multiple Pain Sites  No         [facility] OT Assessment - 07/14/19 1833      Assessment   Medical Diagnosis  L deQuervain    Referring Provider (OT)  Delsa Grana PA    Onset Date/Surgical Date  05/12/19  Hand Dominance  Left    Prior Therapy  --   R lymphedema      Precautions   Precaution Comments  --   R UE and thoracic lymphedema   Required Braces or Orthoses  --   L thumb spica      Balance Screen   Has the patient fallen in the past 6 months  No      Prior Function   Vocation  On disability    Leisure  watch tv, play with her 3 yrs daughter , some house work       AROM   Right Wrist Extension  60 Degrees    Right Wrist Flexion  80 Degrees    Right Wrist Radial Deviation  20 Degrees    Right Wrist Ulnar Deviation  30 Degrees    Left Wrist Extension  60 Degrees   pain 7/10   Left Wrist Flexion  80 Degrees   pain 7/10    Left Wrist Radial Deviation  20 Degrees    Left Wrist Ulnar Deviation  30 Degrees   pain 7/10      Strength   Right Hand Grip (lbs)  55    Right Hand Lateral Pinch  15 lbs    Right Hand 3 Point Pinch  17 lbs    Left Hand Grip (lbs)  20     pain 7/10   Left Hand Lateral Pinch  8 lbs    Left Hand 3 Point Pinch  6 lbs      Left Hand AROM   L Thumb MCP 0-60  50 Degrees    L Thumb Radial ADduction/ABduction 0-55  48    L Thumb Palmar ADduction/ABduction 0-45  38        Patient has custom thumb spica for pt to wear all the time, no complaints or issues with splint wear or fit. Positive finkelstein this date on left.      Performed Contrast as outlined in flowsheet for edema management and to decrease pain, increase ROM.    ROM assessed per flow sheet.   Manual therapy for soft tissue mobilization to left thumb, web space, wrist and hand prior to ROM exercises.    AROM pain free for wrist flexion/extension some pain reported with circumduction of the wrist and with supination of the forearm.  No pain at the thumb with opposition except to small finger.  Performed for 10 reps  Patient to continue with use of Thumb Spica on all the time - off for ADL's and HEP   To decrease pain  Several times during day - ice massage over 1st dorsal compartment   Iontophoresis performed this date per flow sheet with skin check prior to and after with no issues noted.        OT Treatments/Exercises (OP) - 07/14/19 1836      Iontophoresis   Type of Iontophoresis  Dexamethasone    Location  left over 1st dorsal compartment    Dose  medium patch, intensity 2.0    Time  20 mins      LUE Contrast Bath   Time  11 minutes    Comments  to decrease edema and increase ROM             OT Education - 07/14/19 1829    Education Details  contrast for edema, use of ionto    Person(s) Educated  Patient    Methods  Explanation;Demonstration;Handout    Comprehension  Verbalized  understanding;Returned demonstration       OT Short Term Goals - 07/10/19 1746      OT SHORT TERM GOAL #1   Title  Pain on PRWHE improve with more than 20 points    Baseline  at eval PRWHE for pain 46/50    Time  3    Period  Weeks    Status  New     Target Date  07/31/19      OT SHORT TERM GOAL #2   Title  Pt to be independent in use of splint and HEP to decrease pain in L wrist    Baseline  pain 6-10/10 - PRWHE 46/50 - had pain in over the counter sleeve- now custom thumb spica    Time  2    Period  Weeks    Status  New    Target Date  07/24/19        OT Long Term Goals - 07/10/19 1748      OT LONG TERM GOAL #1   Title  Pain in L wrist increase for pt to wean out of custom thumb spica    Baseline  Pain on PRWHE 46/50 ., and splint all the time    Time  5    Period  Weeks    Status  New    Target Date  08/14/19      OT LONG TERM GOAL #2   Title  Pt L wrist AROM in all planes increase for pt to have not increase symptoms more than 2/10    Baseline  pain 6-10/10 , PRWHE 46/50 - at eval and increase pain with AROM and grip/prehension with use -    Time  6    Period  Weeks    Status  New    Target Date  08/21/19            Plan - 07/14/19 1830    Clinical Impression Statement  Patient reports decreased pain this date to 7/10 (decreased from 10/10) and reports decreased tenderness from last session.  Patient wearing thumb spica splint at all times except for ADLs and when performing exercises with no issues.  Patient responding well to iontophoresis but continues to have pain in left hand/wrist.  Pain with circumduction of the wrist and with supination specifically at distal radius.  Continue OT to increase ROM, decrease pain and improve functional use of left UE for necessary daily tasks.    OT Occupational Profile and History  Problem Focused Assessment - Including review of records relating to presenting problem    Occupational performance deficits (Please refer to evaluation for details):  IADL's;Work;Leisure;Play;ADL's    Body Structure / Function / Physical Skills  ADL;IADL;Pain;Strength;ROM;UE functional use;Dexterity    Clinical Decision Making  Limited treatment options, no task modification necessary     Comorbidities Affecting Occupational Performance:  May have comorbidities impacting occupational performance    Modification or Assistance to Complete Evaluation   Min-Moderate modification of tasks or assist with assess necessary to complete eval    OT Frequency  2x / week    OT Duration  6 weeks    OT Treatment/Interventions  Self-care/ADL training;Patient/family education;Therapeutic exercise;DME and/or AE instruction;Passive range of motion;Manual Therapy;Iontophoresis;Contrast Bath;Splinting;Cryotherapy    Consulted and Agree with Plan of Care  Patient       Patient will benefit from skilled therapeutic intervention in order to improve the following deficits and impairments:   Body Structure / Function / Physical Skills: ADL,  IADL, Pain, Strength, ROM, UE functional use, Dexterity       Visit Diagnosis: Muscle weakness (generalized)  Pain in left wrist  Stiffness of left wrist, not elsewhere classified    Problem List Patient Active Problem List   Diagnosis Date Noted  . Lymphedema of arm 12/21/2018  . Bilateral hand numbness 12/21/2018  . GERD without esophagitis 12/21/2018  . Malignant neoplasm of upper-outer quadrant of right breast in female, estrogen receptor positive ([place]) 12/07/2017  . Allergic conjunctivitis of both eyes 09/16/2017  . Family history of breast cancer 10/13/2016  . Dysplasia of cervix, low grade (CIN 1) 10/13/2016  . Thrombocytosis ([place]) 03/16/2016  . Hemoglobin C trait ([place]) 11/29/2015  . Hematuria 02/21/2015  . Vitamin D deficiency 02/21/2015  . Sterilization consult 02/21/2015  . Low back pain   . Morbid obesity ([place])   . Hyperlipidemia   . Hypertension   . IFG (impaired fasting glucose)   . Migraines    Avian Greenawalt T Ranelle Auker, OTR/L, CLT  Zahara Rembert 07/14/2019, 6:56 PM  [facility] [address] [place], [address] Phone: [phone]   Fax:  [phone]  Name: Mckenzie  Maram Lopez MRN: [id number] Date of Birth: 01/30/1986

## 2019-07-17 ENCOUNTER — Other Ambulatory Visit: Payer: Self-pay

## 2019-07-17 ENCOUNTER — Encounter: Payer: Self-pay | Admitting: Physical Therapy

## 2019-07-17 ENCOUNTER — Ambulatory Visit: Payer: Medicaid Other

## 2019-07-17 ENCOUNTER — Ambulatory Visit: Payer: Medicaid Other | Admitting: Occupational Therapy

## 2019-07-17 DIAGNOSIS — M25611 Stiffness of right shoulder, not elsewhere classified: Secondary | ICD-10-CM | POA: Diagnosis not present

## 2019-07-17 DIAGNOSIS — I972 Postmastectomy lymphedema syndrome: Secondary | ICD-10-CM

## 2019-07-17 DIAGNOSIS — M25632 Stiffness of left wrist, not elsewhere classified: Secondary | ICD-10-CM | POA: Diagnosis not present

## 2019-07-17 DIAGNOSIS — M25532 Pain in left wrist: Secondary | ICD-10-CM | POA: Diagnosis not present

## 2019-07-17 DIAGNOSIS — M5441 Lumbago with sciatica, right side: Secondary | ICD-10-CM | POA: Diagnosis not present

## 2019-07-17 DIAGNOSIS — M6281 Muscle weakness (generalized): Secondary | ICD-10-CM | POA: Diagnosis not present

## 2019-07-17 DIAGNOSIS — M25511 Pain in right shoulder: Secondary | ICD-10-CM | POA: Diagnosis not present

## 2019-07-17 DIAGNOSIS — R293 Abnormal posture: Secondary | ICD-10-CM | POA: Diagnosis not present

## 2019-07-17 DIAGNOSIS — G8929 Other chronic pain: Secondary | ICD-10-CM

## 2019-07-17 NOTE — Therapy (Signed)
[facility] [address], [place], [address] Phone: [phone]   Fax:  [phone]  Occupational Therapy Treatment  Patient Details  Name: Mckenzie Lopez MRN: [id number] Date of Birth: Mar 20, 1986 Referring Provider (OT): Delsa Grana PA   Encounter Date: 07/17/2019  OT End of Session - 07/17/19 1442    Visit Number  3    Number of Visits  10    Date for OT Re-Evaluation  08/21/19    Authorization - Visit Number  2    Authorization - Number of Visits  11    OT Start Time  1430    OT Stop Time  1518    OT Time Calculation (min)  48 min    Activity Tolerance  Patient tolerated treatment well    Behavior During Therapy  [facility] for tasks assessed/performed       Past Medical History:  Diagnosis Date  . BRCA negative 2019   BRCA with Dr. Bary Castilla, 7/20 Vistaseq panel neg with Elmo Putt Copland, PA-C  . CHF (congestive heart failure) ([place])    POSTPARTUM 2017/ RESOLVED  . Eczema   . Hemoglobin C trait ([place]) 11/29/2015  . Hyperlipidemia   . Hypertension   . IFG (impaired fasting glucose)   . Intraductal carcinoma of right breast 11/2017   ER/PR+; Her2neu neg; mets to LN  . Low back pain   . Migraines   . Morbid obesity ([place])     Past Surgical History:  Procedure Laterality Date  . AXILLARY LYMPH NODE BIOPSY Right 12/01/2017   METASTATIC MAMMARY CARCINOMA  . AXILLARY LYMPH NODE BIOPSY Left 12/01/2017   NEGATIVE FOR MALIGNANCY  . BREAST BIOPSY Right 12/01/2017   11 and 12 o'clock, INVASIVE MAMMARY CARCINOMA, ER/PR positive HER2 negative  . MASTECTOMY Right 01/05/2018    There were no vitals filed for this visit.  Subjective Assessment - 07/17/19 1440    Subjective   Tenderness over the bony area- but less pain with wrist and thumb motion    Pertinent History  R modified radical mastectomy with lymph node dissection; negative margins; 5 of 21 lymph nodes positive for metastatic carcinoma;01/31/18  Chemotherapy Initiated: Dose-dense doxorubicin-cyclophosphamide (ddAC) q2 weeks x 4 cycles followed by weekly paclitaxel (weekly-T).had radiation - then developed neuropathy form chemo - harder time doing thinkgs with her hand - tendinitis started in January 2021 in L wrist - pulling up her compression sleeve for daytime and night time , pump - doing her daughers hair    Patient Stated Goals  I want the pain better in my L wrist so I can use it to hold , pick up , squeez objects , do my daughters hair, put on my clothes and compression garments    Currently in Pain?  Yes    Pain Score  8     Pain Location  Wrist    Pain Orientation  Left    Pain Descriptors / Indicators  Tender    Pain Type  Acute pain    Pain Onset  More than a month ago    Pain Frequency  Constant          Patient has custom thumb spica for pt to wear all the time, add some mold skin to inside of wrist over distal radius head and ed pt to not pull tight tight on strap. Positive finkelstein this date still    Performed Contrast as outlined in flowsheet for edema management and to  decrease pain, increase ROM.  and to do at home   Less pain with wrist and thumb AROM - still pain with RA of thumb and UD of wrist  Manual therapy for soft tissue mobilization to left thumb, web space, wrist and hand prior to ROM exercises.    Patient to continue with use of Thumb Spica on all the time - off for ADL's and HEP   To decrease pain  Several times during day - ice massage over 1st dorsal compartment  Iontophoresis performed this date per flow sheet with skin check prior and pt kept on patch for hour afterwards           OT Treatments/Exercises (OP) - 07/17/19 0001      Iontophoresis   Type of Iontophoresis  Dexamethasone    Location  left over 1st dorsal compartment    Dose  medium patch, intensity 2.0    Time  20 mins      LUE Contrast Bath   Time  9 minutes    Comments  at [facility] to decreaes pain               OT Education - 07/17/19 1441    Education Details  contrast for edema, use of ionto, splint use    Person(s) Educated  Patient    Methods  Explanation;Demonstration;Handout    Comprehension  Verbalized understanding;Returned demonstration       OT Short Term Goals - 07/10/19 1746      OT SHORT TERM GOAL #1   Title  Pain on PRWHE improve with more than 20 points    Baseline  at eval PRWHE for pain 46/50    Time  3    Period  Weeks    Status  New    Target Date  07/31/19      OT SHORT TERM GOAL #2   Title  Pt to be independent in use of splint and HEP to decrease pain in L wrist    Baseline  pain 6-10/10 - PRWHE 46/50 - had pain in over the counter sleeve- now custom thumb spica    Time  2    Period  Weeks    Status  New    Target Date  07/24/19        OT Long Term Goals - 07/10/19 1748      OT LONG TERM GOAL #1   Title  Pain in L wrist increase for pt to wean out of custom thumb spica    Baseline  Pain on PRWHE 46/50 ., and splint all the time    Time  5    Period  Weeks    Status  New    Target Date  08/14/19      OT LONG TERM GOAL #2   Title  Pt L wrist AROM in all planes increase for pt to have not increase symptoms more than 2/10    Baseline  pain 6-10/10 , PRWHE 46/50 - at eval and increase pain with AROM and grip/prehension with use -    Time  6    Period  Weeks    Status  New    Target Date  08/21/19            Plan - 07/17/19 1443    Clinical Impression Statement  Patient reports decreased pain this date to 8/10 (decreased from 10/10)  tenderness and AROM wrist flexion and thumb RA from eval.  Patient wearing thumb spica  splint at all times except for ADLs and when performing exercises with some increase discomfort at radial head- pt to not pull strap to tight and add some padding to inside of splint .  Patient responding well to iontophoresis but continues to have pain in left hand/wrist.  Pain with wrist UD , thumb RA and Finkelstein.   Continue OT to increase ROM, decrease pain and improve functional use of left UE for necessary daily tasks.    OT Occupational Profile and History  Problem Focused Assessment - Including review of records relating to presenting problem    Occupational performance deficits (Please refer to evaluation for details):  IADL's;Work;Leisure;Play;ADL's    Body Structure / Function / Physical Skills  ADL;IADL;Pain;Strength;ROM;UE functional use;Dexterity    Rehab Potential  Good    Clinical Decision Making  Limited treatment options, no task modification necessary    Comorbidities Affecting Occupational Performance:  May have comorbidities impacting occupational performance    Modification or Assistance to Complete Evaluation   Min-Moderate modification of tasks or assist with assess necessary to complete eval    OT Frequency  2x / week    OT Duration  6 weeks    OT Treatment/Interventions  Self-care/ADL training;Patient/family education;Therapeutic exercise;DME and/or AE instruction;Passive range of motion;Manual Therapy;Iontophoresis;Contrast Bath;Splinting;Cryotherapy    Plan  assess pain decrease with splint use and ionto    OT Home Exercise Plan  see pt instruction     Consulted and Agree with Plan of Care  Patient       Patient will benefit from skilled therapeutic intervention in order to improve the following deficits and impairments:   Body Structure / Function / Physical Skills: ADL, IADL, Pain, Strength, ROM, UE functional use, Dexterity       Visit Diagnosis: Pain in left wrist  Muscle weakness (generalized)  Stiffness of left wrist, not elsewhere classified  Postmastectomy lymphedema syndrome    Problem List Patient Active Problem List   Diagnosis Date Noted  . Lymphedema of arm 12/21/2018  . Bilateral hand numbness 12/21/2018  . GERD without esophagitis 12/21/2018  . Malignant neoplasm of upper-outer quadrant of right breast in female, estrogen receptor positive ([place])  12/07/2017  . Allergic conjunctivitis of both eyes 09/16/2017  . Family history of breast cancer 10/13/2016  . Dysplasia of cervix, low grade (CIN 1) 10/13/2016  . Thrombocytosis ([place]) 03/16/2016  . Hemoglobin C trait ([place]) 11/29/2015  . Hematuria 02/21/2015  . Vitamin D deficiency 02/21/2015  . Sterilization consult 02/21/2015  . Low back pain   . Morbid obesity ([place])   . Hyperlipidemia   . Hypertension   . IFG (impaired fasting glucose)   . Migraines     Derral Colucci  OTR/L,CLT 07/17/2019, 5:12 PM  [facility] [address] [place], [address] Phone: [phone]   Fax:  [phone]  Name: Mckenzie Lopez MRN: [id number] Date of Birth: Dec 13, 1985

## 2019-07-17 NOTE — Therapy (Signed)
[facility] [address], [place], [address] Phone: [phone]   Fax:  [phone]  Physical Therapy Treatment  Patient Details  Name: Mckenzie Lopez MRN: [id number] Date of Birth: Dec 05, 1985 Referring Provider (PT): [name] PA (Oncology)   Encounter Date: 07/17/2019  PT End of Session - 07/17/19 1428    Visit Number  10    Number of Visits  17    Date for PT Re-Evaluation  07/21/19    Authorization Type  [place] [employer]    Authorization Time Period  06/16/2019-07/27/2019    Authorization - Visit Number  6    Authorization - Number of Visits  12    PT Start Time  0145    PT Stop Time  0230    PT Time Calculation (min)  45 min    Equipment Utilized During Treatment  Other (comment)    Activity Tolerance  Patient tolerated treatment well    Behavior During Therapy  [facility] for tasks assessed/performed       Past Medical History:  Diagnosis Date  . BRCA negative 2019   BRCA with Dr. Bary Castilla, 7/20 Vistaseq panel neg with Elmo Putt Copland, PA-C  . CHF (congestive heart failure) ([place])    POSTPARTUM 2017/ RESOLVED  . Eczema   . Hemoglobin C trait ([place]) 11/29/2015  . Hyperlipidemia   . Hypertension   . IFG (impaired fasting glucose)   . Intraductal carcinoma of right breast 11/2017   ER/PR+; Her2neu neg; mets to LN  . Low back pain   . Migraines   . Morbid obesity ([place])     Past Surgical History:  Procedure Laterality Date  . AXILLARY LYMPH NODE BIOPSY Right 12/01/2017   METASTATIC MAMMARY CARCINOMA  . AXILLARY LYMPH NODE BIOPSY Left 12/01/2017   NEGATIVE FOR MALIGNANCY  . BREAST BIOPSY Right 12/01/2017   11 and 12 o'clock, INVASIVE MAMMARY CARCINOMA, ER/PR positive HER2 negative  . MASTECTOMY Right 01/05/2018    There were no vitals filed for this visit.  Subjective Assessment - 07/17/19 1350    Subjective  Patient reported at rest her shoulder is not painful, but when she moves her  arm around it hurts.    Pertinent History  Pt is a 34 year old female with hx of breast cancer and complaints of R shldr pain. Pt is s/p mastectomy and lymphectomy on 01/05/2018, chemotherapy, and radiation that brought on the R shoulder pain and stiffness. Pt reports skin irritation of the anterior pec, axillary region, and posterior shoulder pain. Pt history of periphreal neuropathy with numbess in bilat hands and feet, c/o of tingling present in R axillary and chest region. Pt reports muscle spasm sensation in upper trap, a "pulling sensation" in axillary region down to the Latissimus Dorsi, and a throbbing sensation in anterior pectoral region. Current pain is 7/10, at worst 10/10, at best 5/10. Pt reports stiffness in R shoulder upon waking and increasing pain throughout the day with activity. Aggravating factors include reaching OH or behind the back and immediate onset and 5 mins for pain to decrease. Easing factors include resting. Pt wears compression sleeve from hand to shoulder most of the day and overnight to decrease swelling. Pt struggles with ADLs including cooking, cleaning, dressing, caring for small child (3y/o daughter) and has been unable to function around the house at previous level from radiation. Pt goal is to decrease pain and increase shoulder ROM and strength for ADLs.    Limitations  Lifting;House hold activities    How long can you sit comfortably?  10-15 minutes (shoulder)    How long can you stand comfortably?  10-15 minutes (also limited by pedal neuropathy)    How long can you walk comfortably?  about 15 minutes    Diagnostic tests  No imaging completed recently    Patient Stated Goals  To complete ADLs    Currently in Pain?  Yes    Pain Score  5    with movement   Pain Location  Shoulder    Pain Orientation  Right    Pain Descriptors / Indicators  Tightness   pull   Pain Type  Acute pain    Pain Onset  More than a month ago       THEREX -Sidelying ABD with 3# 2x  10 with min cuing to maintain scapular contact with mat table with good carry over -Supine shoulder flexion and foam roller under scapula 3#  3x10 with slight hold at top of shoulder flexion moment Scap stabilization in supine with band (yellow) 3x30seconds    MAN THER -PROM for FLEX, ABD for 3x30 sec hold with inferior and posterior glide for increasing mobility -Grade I/II mobs inferior, lateral glides for pain reduction 3 bouts for 30 sec -Trigger point release in pec major muscle 2x15 sec holds with decreased pain noted and some decreased tension noted by PT STM to scar incision 58mns Lat stretch in supine with PT assist  Pt response/clinical impression: The patient demonstrated improved ROM during/end of session, as well as a palpable decrease in pec tension. Pt continues to be challenged by RUE movements against gravity, and would benefit from further PT to continue to address deficits and progress towards goals.    PT Education - 07/17/19 1351    Education Details  therex form    Person(s) Educated  Patient    Methods  Explanation;Demonstration;Tactile cues;Verbal cues    Comprehension  Returned demonstration;Verbalized understanding;Verbal cues required;Tactile cues required       PT Short Term Goals - 06/09/19 0819      PT SHORT TERM GOAL #1   Title  In 4 weeks, pt will decrease pain score by 2 point in order to make progress towards demonstrating clinically significant improvement in pain levels.    Baseline  05/25/19 7/10 pain; 06/09/2019 8/10    Time  4    Period  Weeks    Status  On-going      PT SHORT TERM GOAL #2   Title  Pt will be independent with HEP in order to improve strength and balance in order to decrease fall risk and improve function at home and work.    Baseline  05/25/19 HEP given; compliant with exercise    Time  4    Period  Weeks    Status  On-going        PT Long Term Goals - 06/09/19 0820      PT LONG TERM GOAL #1   Title  In 8 weeks, pt  will decrease pain score by 4 points in order to improve functional ADL capability and care for young daughter.    Baseline  05/26/19 7/10 pain; 06/09/2019 8/10 pain    Time  8    Period  Weeks    Status  On-going      PT LONG TERM GOAL #2   Title  In 8 weeks, pt will improve AROM of R shoulder WNL in order to perform  personal grooming and household ADLs    Baseline  05/26/19 ER: R occiput IR R PSIS Flexion 90; Abduction 82 both painful; 06/09/2019 ER: R occiput IR PSIS Flexion 100 ABD 95 painful for flexion/ABD    Time  8    Period  Weeks    Status  On-going      PT LONG TERM GOAL #3   Title  In 8 weeks, pt will improve gross R shoulder and periscapular strength to 4+/5 MMT to perform heavy household chores and provide care to young daughter .    Baseline  05/26/19 Flexion/ABD/IR 4/5; lower trapezius 2-/5; scap retractors 3/5; I position 4/5; 06/09/2019 Flexion 4+/5, ABD 4/5, IR4/5 ER 4/5, lower trap 2+/5, middle trap 3/5, extension 4-/5    Time  8    Period  Weeks    Status  On-going      PT LONG TERM GOAL #4   Title  Patient will increase FOTO score to 60 to demonstrate predicted increase in functional mobility to complete ADLs    Baseline  05/26/19 30; 06/09/2019 34    Time  8    Period  Weeks    Status  On-going            Plan - 07/17/19 1428    Clinical Impression Statement  The patient demonstrated improved ROM during/end of session, as well as a palpable decrease in pec tension. Pt continues to be challenged by RUE movements against gravity, and would benefit from further PT to continue to address deficits and progress towards goals.    Personal Factors and Comorbidities  Comorbidity 3+;Past/Current Experience;Time since onset of injury/illness/exacerbation;Profession    Examination-Activity Limitations  Caring for Others;Dressing;Carry;Hygiene/Grooming;Bathing    Examination-Participation Restrictions  Cleaning;Laundry;Community Activity    Stability/Clinical Decision  Making  Evolving/Moderate complexity    Rehab Potential  Good    PT Frequency  2x / week    PT Duration  8 weeks    PT Treatment/Interventions  ADLs/Self Care Home Management;Aquatic Therapy;Functional mobility training;Therapeutic activities;Therapeutic exercise;Manual techniques;Compression bandaging;Neuromuscular re-education    PT Next Visit Plan  mobility, parascapular strength    PT Home Exercise Plan  doorway pec stretch, supine snow angels, table slides (flex/abd), supine long direction pec stretch, lateral neck flexion stretch for upper trap    Consulted and Agree with Plan of Care  Patient       Patient will benefit from skilled therapeutic intervention in order to improve the following deficits and impairments:  Decreased activity tolerance, Decreased range of motion, Decreased strength, Hypomobility, Impaired sensation, Impaired UE functional use, Pain, Decreased mobility, Decreased scar mobility, Increased edema, Postural dysfunction  Visit Diagnosis: Muscle weakness (generalized)  Pain in left wrist  Stiffness of left wrist, not elsewhere classified  Chronic right shoulder pain  Stiffness of right shoulder, not elsewhere classified  Abnormal posture  Postmastectomy lymphedema syndrome  Chronic right-sided low back pain with right-sided sciatica     Problem List Patient Active Problem List   Diagnosis Date Noted  . Lymphedema of arm 12/21/2018  . Bilateral hand numbness 12/21/2018  . GERD without esophagitis 12/21/2018  . Malignant neoplasm of upper-outer quadrant of right breast in female, estrogen receptor positive ([place]) 12/07/2017  . Allergic conjunctivitis of both eyes 09/16/2017  . Family history of breast cancer 10/13/2016  . Dysplasia of cervix, low grade (CIN 1) 10/13/2016  . Thrombocytosis ([place]) 03/16/2016  . Hemoglobin C trait ([place]) 11/29/2015  . Hematuria 02/21/2015  . Vitamin D deficiency 02/21/2015  .  Sterilization consult 02/21/2015  . Low  back pain   . Morbid obesity ([place])   . Hyperlipidemia   . Hypertension   . IFG (impaired fasting glucose)   . Migraines     Lieutenant Diego PT, DPT 11:42 AM,07/18/19   [facility] [address], [place], [address] Phone: [phone]   Fax:  [phone]  Name: Mckenzie Lopez MRN: [id number] Date of Birth: [birth year]

## 2019-07-17 NOTE — Patient Instructions (Signed)
same

## 2019-07-19 ENCOUNTER — Ambulatory Visit: Payer: Medicaid Other | Admitting: Physical Therapy

## 2019-07-19 ENCOUNTER — Other Ambulatory Visit: Payer: Self-pay

## 2019-07-19 ENCOUNTER — Encounter: Payer: Self-pay | Admitting: Physical Therapy

## 2019-07-19 DIAGNOSIS — M25511 Pain in right shoulder: Secondary | ICD-10-CM | POA: Diagnosis not present

## 2019-07-19 DIAGNOSIS — M25611 Stiffness of right shoulder, not elsewhere classified: Secondary | ICD-10-CM | POA: Diagnosis not present

## 2019-07-19 DIAGNOSIS — M25632 Stiffness of left wrist, not elsewhere classified: Secondary | ICD-10-CM | POA: Diagnosis not present

## 2019-07-19 DIAGNOSIS — M6281 Muscle weakness (generalized): Secondary | ICD-10-CM | POA: Diagnosis not present

## 2019-07-19 DIAGNOSIS — M25532 Pain in left wrist: Secondary | ICD-10-CM

## 2019-07-19 DIAGNOSIS — M5441 Lumbago with sciatica, right side: Secondary | ICD-10-CM | POA: Diagnosis not present

## 2019-07-19 DIAGNOSIS — R293 Abnormal posture: Secondary | ICD-10-CM | POA: Diagnosis not present

## 2019-07-19 DIAGNOSIS — I972 Postmastectomy lymphedema syndrome: Secondary | ICD-10-CM | POA: Diagnosis not present

## 2019-07-19 DIAGNOSIS — G8929 Other chronic pain: Secondary | ICD-10-CM | POA: Diagnosis not present

## 2019-07-19 NOTE — Therapy (Signed)
[facility] [address], [place], [address] Phone: [phone]   Fax:  [phone]  Physical Therapy Treatment  Patient Details  Name: Mckenzie Lopez MRN: [id number] Date of Birth: 04/02/1986 Referring Provider (PT): [name] PA (Oncology)   Encounter Date: 07/19/2019  PT End of Session - 07/19/19 1126    Visit Number  11    Number of Visits  17    Date for PT Re-Evaluation  07/21/19    Authorization Type  [place] Medicaid    Authorization Time Period  06/16/2019-07/27/2019    Authorization - Visit Number  7    Authorization - Number of Visits  12    PT Start Time  1116    PT Stop Time  1200    PT Time Calculation (min)  44 min    Activity Tolerance  Patient tolerated treatment well    Behavior During Therapy  [facility] for tasks assessed/performed       Past Medical History:  Diagnosis Date  . BRCA negative 2019   BRCA with Dr. Bary Castilla, 7/20 Vistaseq panel neg with Elmo Putt Copland, PA-C  . CHF (congestive heart failure) ([place])    POSTPARTUM 2017/ RESOLVED  . Eczema   . Hemoglobin C trait ([place]) 11/29/2015  . Hyperlipidemia   . Hypertension   . IFG (impaired fasting glucose)   . Intraductal carcinoma of right breast 11/2017   ER/PR+; Her2neu neg; mets to LN  . Low back pain   . Migraines   . Morbid obesity ([place])     Past Surgical History:  Procedure Laterality Date  . AXILLARY LYMPH NODE BIOPSY Right 12/01/2017   METASTATIC MAMMARY CARCINOMA  . AXILLARY LYMPH NODE BIOPSY Left 12/01/2017   NEGATIVE FOR MALIGNANCY  . BREAST BIOPSY Right 12/01/2017   11 and 12 o'clock, INVASIVE MAMMARY CARCINOMA, ER/PR positive HER2 negative  . MASTECTOMY Right 01/05/2018    There were no vitals filed for this visit.  Subjective Assessment - 07/19/19 1117    Subjective  Reports 4/10 pain today, overhead motion improving. Thinks sacar massage from previous session helped.    Pertinent History  Pt is a  34 year old female with hx of breast cancer and complaints of R shldr pain. Pt is s/p mastectomy and lymphectomy on 01/05/2018, chemotherapy, and radiation that brought on the R shoulder pain and stiffness. Pt reports skin irritation of the anterior pec, axillary region, and posterior shoulder pain. Pt history of periphreal neuropathy with numbess in bilat hands and feet, c/o of tingling present in R axillary and chest region. Pt reports muscle spasm sensation in upper trap, a "pulling sensation" in axillary region down to the Latissimus Dorsi, and a throbbing sensation in anterior pectoral region. Current pain is 7/10, at worst 10/10, at best 5/10. Pt reports stiffness in R shoulder upon waking and increasing pain throughout the day with activity. Aggravating factors include reaching OH or behind the back and immediate onset and 5 mins for pain to decrease. Easing factors include resting. Pt wears compression sleeve from hand to shoulder most of the day and overnight to decrease swelling. Pt struggles with ADLs including cooking, cleaning, dressing, caring for small child (3y/o daughter) and has been unable to function around the house at previous level from radiation. Pt goal is to decrease pain and increase shoulder ROM and strength for ADLs.    Limitations  Lifting;House hold activities    How long can you sit comfortably?  10-15  minutes (shoulder)    How long can you stand comfortably?  10-15 minutes (also limited by pedal neuropathy)    How long can you walk comfortably?  about 15 minutes    Diagnostic tests  No imaging completed recently    Patient Stated Goals  To complete ADLs    Pain Onset  More than a month ago       Sawpit with 3# 3x 10 with min cuing for scapular movement and full availible ROM - Sidelying ER with towel under elbow for TC 3# DB 3x 10/9/8 with TC for technique initially with good carry over - Prone Y with minimal lift 3x 6/5/5 with max cuing for scapular  retraction with decent carry over - Prone T 3x 10 with good carry over of scapular retraction, fatigue at end of reps - Supine lat stretch 63mn hold  MAN THER -PROM for FLEX, ABD for 3x30 sec hold with inferior and posterior glide for increasing mobility -Grade I/II mobs inferior, lateral glides for pain reduction 3 bouts for 30 sec -Trigger point release in pec major muscle 2x15 sec holds with decreased pain noted and some decreased tension noted by PT STM to scar incision 569ms abd with scar massage                         PT Education - 07/19/19 1126    Education Details  therex form; scar massage technique    Person(s) Educated  Patient    Methods  Explanation;Demonstration;Tactile cues;Verbal cues;Handout    Comprehension  Verbalized understanding;Returned demonstration;Verbal cues required;Tactile cues required       PT Short Term Goals - 06/09/19 0819      PT SHORT TERM GOAL #1   Title  In 4 weeks, pt will decrease pain score by 2 point in order to make progress towards demonstrating clinically significant improvement in pain levels.    Baseline  05/25/19 7/10 pain; 06/09/2019 8/10    Time  4    Period  Weeks    Status  On-going      PT SHORT TERM GOAL #2   Title  Pt will be independent with HEP in order to improve strength and balance in order to decrease fall risk and improve function at home and work.    Baseline  05/25/19 HEP given; compliant with exercise    Time  4    Period  Weeks    Status  On-going        PT Long Term Goals - 06/09/19 0820      PT LONG TERM GOAL #1   Title  In 8 weeks, pt will decrease pain score by 4 points in order to improve functional ADL capability and care for young daughter.    Baseline  05/26/19 7/10 pain; 06/09/2019 8/10 pain    Time  8    Period  Weeks    Status  On-going      PT LONG TERM GOAL #2   Title  In 8 weeks, pt will improve AROM of R shoulder WNL in order to perform personal grooming and household  ADLs    Baseline  05/26/19 ER: R occiput IR R PSIS Flexion 90; Abduction 82 both painful; 06/09/2019 ER: R occiput IR PSIS Flexion 100 ABD 95 painful for flexion/ABD    Time  8    Period  Weeks    Status  On-going      PT LONG TERM GOAL #  3   Title  In 8 weeks, pt will improve gross R shoulder and periscapular strength to 4+/5 MMT to perform heavy household chores and provide care to young daughter .    Baseline  05/26/19 Flexion/ABD/IR 4/5; lower trapezius 2-/5; scap retractors 3/5; I position 4/5; 06/09/2019 Flexion 4+/5, ABD 4/5, IR4/5 ER 4/5, lower trap 2+/5, middle trap 3/5, extension 4-/5    Time  8    Period  Weeks    Status  On-going      PT LONG TERM GOAL #4   Title  Patient will increase FOTO score to 60 to demonstrate predicted increase in functional mobility to complete ADLs    Baseline  05/26/19 30; 06/09/2019 34    Time  8    Period  Weeks    Status  On-going            Plan - 07/19/19 1144    Clinical Impression Statement  PT continued therex progression for increased mobility and strengthening with good success. Patient continues to respond well to manual techniques with decreased tension and increased PROM and AROM following. This session patient is complete prone therex, which is an improvement. PT will continue progression as able.    Personal Factors and Comorbidities  Comorbidity 3+;Past/Current Experience;Time since onset of injury/illness/exacerbation;Profession    Examination-Activity Limitations  Caring for Others;Dressing;Carry;Hygiene/Grooming;Bathing    Examination-Participation Restrictions  Cleaning;Laundry;Community Activity    Stability/Clinical Decision Making  Evolving/Moderate complexity    Clinical Decision Making  Moderate    Rehab Potential  Good    PT Frequency  2x / week    PT Duration  8 weeks    PT Treatment/Interventions  ADLs/Self Care Home Management;Aquatic Therapy;Functional mobility training;Therapeutic activities;Therapeutic  exercise;Manual techniques;Compression bandaging;Neuromuscular re-education    PT Next Visit Plan  mobility, parascapular strength    PT Home Exercise Plan  doorway pec stretch, supine snow angels, table slides (flex/abd), supine long direction pec stretch, lateral neck flexion stretch for upper trap    Consulted and Agree with Plan of Care  Patient       Patient will benefit from skilled therapeutic intervention in order to improve the following deficits and impairments:  Decreased activity tolerance, Decreased range of motion, Decreased strength, Hypomobility, Impaired sensation, Impaired UE functional use, Pain, Decreased mobility, Decreased scar mobility, Increased edema, Postural dysfunction  Visit Diagnosis: Pain in left wrist  Muscle weakness (generalized)     Problem List Patient Active Problem List   Diagnosis Date Noted  . Lymphedema of arm 12/21/2018  . Bilateral hand numbness 12/21/2018  . GERD without esophagitis 12/21/2018  . Malignant neoplasm of upper-outer quadrant of right breast in female, estrogen receptor positive ([place]) 12/07/2017  . Allergic conjunctivitis of both eyes 09/16/2017  . Family history of breast cancer 10/13/2016  . Dysplasia of cervix, low grade (CIN 1) 10/13/2016  . Thrombocytosis ([place]) 03/16/2016  . Hemoglobin C trait ([place]) 11/29/2015  . Hematuria 02/21/2015  . Vitamin D deficiency 02/21/2015  . Sterilization consult 02/21/2015  . Low back pain   . Morbid obesity ([place])   . Hyperlipidemia   . Hypertension   . IFG (impaired fasting glucose)   . Migraines    Shelton Silvas PT, DPT Shelton Silvas 07/19/2019, 11:56 AM  [facility] [address] [place], [address] Phone: [phone]   Fax:  [phone]  Name: Mckenzie Lopez MRN: [id number] Date of Birth: Dec 07, 1985

## 2019-07-20 ENCOUNTER — Other Ambulatory Visit: Payer: Self-pay | Admitting: Family Medicine

## 2019-07-20 ENCOUNTER — Encounter: Payer: Self-pay | Admitting: Family Medicine

## 2019-07-20 MED ORDER — FLUTICASONE PROPIONATE 50 MCG/ACT NA SUSP: 2.0000 | Freq: Every day | NASAL | 0 refills | Status: DC | PRN

## 2019-07-20 NOTE — Telephone Encounter (Signed)
Requested Prescriptions  Pending Prescriptions Disp Refills  . fluticasone (FLONASE) 50 MCG/ACT nasal spray [Pharmacy Med Name: FLUTICASONE 50MCG NASAL SP (120) RX] 48 g     Sig: SHAKE LIQUID AND USE 2 SPRAYS IN EACH NOSTRIL DAILY AS NEEDED FOR ALLERGIES     Ear, Nose, and Throat: Nasal Preparations - Corticosteroids Passed - 07/20/2019  3:08 PM      Passed - Valid encounter within last 12 months    Recent Outpatient Visits          7 months ago Malignant neoplasm of upper-outer quadrant of right breast in female, estrogen receptor positive [facility])   [facility] Hartshorn, FNP   10 months ago Essential hypertension   [facility] [place], Bethel Born, NP   1 year ago Weight gain   [facility], Satira Anis, MD   1 year ago Essential hypertension   [facility]ada, Satira Anis, MD   1 year ago Allergic conjunctivitis of both eyes   [facility] Lada, Satira Anis, MD

## 2019-07-21 ENCOUNTER — Ambulatory Visit: Payer: Medicaid Other | Admitting: Occupational Therapy

## 2019-07-21 ENCOUNTER — Other Ambulatory Visit: Payer: Self-pay

## 2019-07-21 ENCOUNTER — Encounter: Payer: Self-pay | Admitting: Occupational Therapy

## 2019-07-21 DIAGNOSIS — G8929 Other chronic pain: Secondary | ICD-10-CM | POA: Diagnosis not present

## 2019-07-21 DIAGNOSIS — M25511 Pain in right shoulder: Secondary | ICD-10-CM | POA: Diagnosis not present

## 2019-07-21 DIAGNOSIS — M25611 Stiffness of right shoulder, not elsewhere classified: Secondary | ICD-10-CM | POA: Diagnosis not present

## 2019-07-21 DIAGNOSIS — M5441 Lumbago with sciatica, right side: Secondary | ICD-10-CM | POA: Diagnosis not present

## 2019-07-21 DIAGNOSIS — M6281 Muscle weakness (generalized): Secondary | ICD-10-CM | POA: Diagnosis not present

## 2019-07-21 DIAGNOSIS — M25632 Stiffness of left wrist, not elsewhere classified: Secondary | ICD-10-CM

## 2019-07-21 DIAGNOSIS — M25532 Pain in left wrist: Secondary | ICD-10-CM

## 2019-07-21 DIAGNOSIS — R293 Abnormal posture: Secondary | ICD-10-CM | POA: Diagnosis not present

## 2019-07-21 DIAGNOSIS — I972 Postmastectomy lymphedema syndrome: Secondary | ICD-10-CM | POA: Diagnosis not present

## 2019-07-21 NOTE — Therapy (Signed)
[facility] [address] [place], [place], [address] Phone: [phone]   Fax:  [phone]  Occupational Therapy Treatment  Patient Details  Name: Mckenzie Lopez MRN: [id number] Date of Birth: 06/27/1985 Referring Provider (OT): Leisa Tapia PA   Encounter Date: 07/21/2019  OT End of Session - 07/21/19 1607    Visit Number  4    Number of Visits  10    Date for OT Re-Evaluation  08/21/19    Authorization - Visit Number  3    OT Start Time  1000    OT Stop Time  1046    OT Time Calculation (min)  46 min    Activity Tolerance  Patient tolerated treatment well    Behavior During Therapy  WFL for tasks assessed/performed       Past Medical History:  Diagnosis Date  . BRCA negative 2019   BRCA with Dr. Byrnett, 7/20 Vistaseq panel neg with Alicia Copland, PA-C  . CHF (congestive heart failure) ([facility])    POSTPARTUM 2017/ RESOLVED  . Eczema   . 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 Hyperlipidemia   . Hypertension   . IFG (impaired fasting glucose)   . Intraductal carcinoma of right breast 11/2017   ER/PR+; Her2neu neg; mets to LN  . Low back pain   . Migraines   . Morbid obesity ([facility])     Past Surgical History:  Procedure Laterality Date  . AXILLARY LYMPH NODE BIOPSY Right 12/01/2017   METASTATIC MAMMARY CARCINOMA  . AXILLARY LYMPH NODE BIOPSY Left 12/01/2017   NEGATIVE FOR MALIGNANCY  . BREAST BIOPSY Right 12/01/2017   11 and 12 o'clock, INVASIVE MAMMARY CARCINOMA, ER/PR positive HER2 negative  . MASTECTOMY Right 01/05/2018    There were no vitals filed for this visit.  Subjective Assessment - 07/21/19 1006    Subjective   Decreased sharp pains but still aching.    Pertinent History  R modified radical mastectomy with lymph node dissection; negative margins; 5 of 21 lymph nodes positive for metastatic carcinoma;01/31/18 Chemotherapy Initiated: Dose-dense doxorubicin-cyclophosphamide (ddAC) q2  weeks x 4 cycles followed by weekly paclitaxel (weekly-T).had radiation - then developed neuropathy form chemo - harder time doing thinkgs with her hand - tendinitis started in January 2021 in L wrist - pulling up her compression sleeve for daytime and night time , pump - doing her daughers hair    Patient Stated Goals  I want the pain better in my L wrist so I can use it to hold , pick up , squeez objects , do my daughters hair, put on my clothes and compression garments    Currently in Pain?  Yes    Pain Score  7     Pain Location  Wrist    Pain Orientation  Left    Pain Descriptors / Indicators  Aching;Tender    Pain Type  Acute pain    Pain Onset  More than a month ago    Pain Frequency  Constant        Patient hascustom thumb spica for pt to wear all the time with some mole skin to inside of wrist over distal radius head and ed pt to not pull tight tight on strap, patient reports area modified last time is improved.    Positive finkelstein this date   PerformedContrastas outlined in flowsheet for edema management and to decrease pain, increase ROM.  Patient to continue to perform at home.      Less pain with wrist and thumb AROM - still some mild pain with RA of thumb and RD of wrist  Manual therapy for soft tissue mobilization to left thumb, web space, wrist and hand prior to ROM exercises.  Patient to continue with use ofThumb Spica on all the time - off for ADL's and HEP   To decrease pain  Several times during day - ice massage over 1st dorsal compartment  Iontophoresis performed this date per flow sheet with skin check prior and pt kept on patch for hour afterwards            OT Treatments/Exercises (OP) - 07/21/19 1614      Iontophoresis   Type of Iontophoresis  Dexamethasone    Location  left over 1st dorsal compartment    Dose  medium patch, intensity 2.0    Time  20 mins      LUE Contrast Bath   Time  11 minutes    Comments  to decrease edema  and increase ROM             OT Education - 07/21/19 1607    Education Details  contrast for edema, use of ionto, splint use    Person(s) Educated  Patient    Methods  Explanation;Demonstration;Handout    Comprehension  Verbalized understanding;Returned demonstration       OT Short Term Goals - 07/10/19 1746      OT SHORT TERM GOAL #1   Title  Pain on PRWHE improve with more than 20 points    Baseline  at eval PRWHE for pain 46/50    Time  3    Period  Weeks    Status  New    Target Date  07/31/19      OT SHORT TERM GOAL #2   Title  Pt to be independent in use of splint and HEP to decrease pain in L wrist    Baseline  pain 6-10/10 - PRWHE 46/50 - had pain in over the counter sleeve- now custom thumb spica    Time  2    Period  Weeks    Status  New    Target Date  07/24/19        OT Long Term Goals - 07/10/19 1748      OT LONG TERM GOAL #1   Title  Pain in L wrist increase for pt to wean out of custom thumb spica    Baseline  Pain on PRWHE 46/50 ., and splint all the time    Time  5    Period  Weeks    Status  New    Target Date  08/14/19      OT LONG TERM GOAL #2   Title  Pt L wrist AROM in all planes increase for pt to have not increase symptoms more than 2/10    Baseline  pain 6-10/10 , PRWHE 46/50 - at eval and increase pain with AROM and grip/prehension with use -    Time  6    Period  Weeks    Status  New    Target Date  08/21/19            Plan - 07/21/19 1608    Clinical Impression Statement  Patient continues to report decreased pain, 7/10 this date with select movements.  Patient continues to use her thumb spica splint and padding added last session has helped.  Patient remains positive for finkelstein.  Continue with contrast at  home, exercises and splint wear.  Continue with ionto txs to decrease pain and inflammation.    OT Occupational Profile and History  Problem Focused Assessment - Including review of records relating to presenting  problem    Occupational performance deficits (Please refer to evaluation for details):  IADL's;Work;Leisure;Play;ADL's    Body Structure / Function / Physical Skills  ADL;IADL;Pain;Strength;ROM;UE functional use;Dexterity    Rehab Potential  Good    Clinical Decision Making  Limited treatment options, no task modification necessary    Comorbidities Affecting Occupational Performance:  May have comorbidities impacting occupational performance    Modification or Assistance to Complete Evaluation   Min-Moderate modification of tasks or assist with assess necessary to complete eval    OT Frequency  2x / week    OT Duration  6 weeks    OT Treatment/Interventions  Self-care/ADL training;Patient/family education;Therapeutic exercise;DME and/or AE instruction;Passive range of motion;Manual Therapy;Iontophoresis;Contrast Bath;Splinting;Cryotherapy    Consulted and Agree with Plan of Care  Patient       Patient will benefit from skilled therapeutic intervention in order to improve the following deficits and impairments:   Body Structure / Function / Physical Skills: ADL, IADL, Pain, Strength, ROM, UE functional use, Dexterity       Visit Diagnosis: Muscle weakness (generalized)  Stiffness of left wrist, not elsewhere classified  Pain in left wrist    Problem List Patient Active Problem List   Diagnosis Date Noted  . Lymphedema of arm 12/21/2018  . Bilateral hand numbness 12/21/2018  . GERD without esophagitis 12/21/2018  . Malignant neoplasm of upper-outer quadrant of right breast in female, estrogen receptor positive ([place]) 12/07/2017  . Allergic conjunctivitis of both eyes 09/16/2017  . Family history of breast cancer 10/13/2016  . Dysplasia of cervix, low grade (CIN 1) 10/13/2016  . Thrombocytosis ([place]) 03/16/2016  . Hemoglobin C trait ([place]) 11/29/2015  . Hematuria 02/21/2015  . Vitamin D deficiency 02/21/2015  . Sterilization consult 02/21/2015  . Low back pain   . Morbid  obesity ([place])   . Hyperlipidemia   . Hypertension   . IFG (impaired fasting glucose)   . Migraines    Nakesha Ebrahim T Isidoro Santillana, OTR/L, CLT  Hayla Hinger 07/21/2019, 4:20 PM  [facility] [address], [place], [address] Phone: [phone]   Fax:  [phone]  Name: Mckenzie Lopez MRN: [id number] Date of Birth: October 17, 1985

## 2019-07-24 ENCOUNTER — Encounter: Payer: Self-pay | Admitting: Physical Therapy

## 2019-07-24 ENCOUNTER — Other Ambulatory Visit: Payer: Self-pay

## 2019-07-24 ENCOUNTER — Ambulatory Visit: Payer: Medicaid Other | Admitting: Physical Therapy

## 2019-07-24 DIAGNOSIS — M25632 Stiffness of left wrist, not elsewhere classified: Secondary | ICD-10-CM | POA: Diagnosis not present

## 2019-07-24 DIAGNOSIS — M25611 Stiffness of right shoulder, not elsewhere classified: Secondary | ICD-10-CM

## 2019-07-24 DIAGNOSIS — M6281 Muscle weakness (generalized): Secondary | ICD-10-CM

## 2019-07-24 DIAGNOSIS — R293 Abnormal posture: Secondary | ICD-10-CM | POA: Diagnosis not present

## 2019-07-24 DIAGNOSIS — G8929 Other chronic pain: Secondary | ICD-10-CM | POA: Diagnosis not present

## 2019-07-24 DIAGNOSIS — I972 Postmastectomy lymphedema syndrome: Secondary | ICD-10-CM | POA: Diagnosis not present

## 2019-07-24 DIAGNOSIS — M25532 Pain in left wrist: Secondary | ICD-10-CM | POA: Diagnosis not present

## 2019-07-24 DIAGNOSIS — M5441 Lumbago with sciatica, right side: Secondary | ICD-10-CM | POA: Diagnosis not present

## 2019-07-24 DIAGNOSIS — M25511 Pain in right shoulder: Secondary | ICD-10-CM | POA: Diagnosis not present

## 2019-07-24 NOTE — Therapy (Signed)
[facility] [address] [place], [address] Phone: [phone]   Fax:  [phone]  Physical Therapy Treatment  Patient Details  Name: Mckenzie Lopez MRN: [id number] Date of Birth: Marchell 08, 1987 Referring Provider (PT): [name] PA (Oncology)   Encounter Date: 07/24/2019  PT End of Session - 07/24/19 1349    Visit Number  12    Number of Visits  17    Date for PT Re-Evaluation  09/04/19    Authorization Type  [place] [employer]    Authorization Time Period  06/16/2019-07/27/2019    Authorization - Visit Number  8    Authorization - Number of Visits  12    PT Start Time  0145    PT Stop Time  0230    PT Time Calculation (min)  45 min    Equipment Utilized During Treatment  Other (comment)    Activity Tolerance  Patient tolerated treatment well    Behavior During Therapy  [facility] for tasks assessed/performed       Past Medical History:  Diagnosis Date  . BRCA negative 2019   BRCA with Dr. Bary Castilla, 7/20 Vistaseq panel neg with Elmo Putt Copland, PA-C  . CHF (congestive heart failure) ([place])    POSTPARTUM 2017/ RESOLVED  . Eczema   . Hemoglobin C trait ([place]) 11/29/2015  . Hyperlipidemia   . Hypertension   . IFG (impaired fasting glucose)   . Intraductal carcinoma of right breast 11/2017   ER/PR+; Her2neu neg; mets to LN  . Low back pain   . Migraines   . Morbid obesity ([place])     Past Surgical History:  Procedure Laterality Date  . AXILLARY LYMPH NODE BIOPSY Right 12/01/2017   METASTATIC MAMMARY CARCINOMA  . AXILLARY LYMPH NODE BIOPSY Left 12/01/2017   NEGATIVE FOR MALIGNANCY  . BREAST BIOPSY Right 12/01/2017   11 and 12 o'clock, INVASIVE MAMMARY CARCINOMA, ER/PR positive HER2 negative  . MASTECTOMY Right 01/05/2018    There were no vitals filed for this visit.   THEREX -SidelyingABD with3#2x 10; 4# x10 with min cuing for scapular movement and full availible ROM - Lat pulldown x10 20#  x10 25# with good carry over of all cuing for technique - Prone Y with minimal lift 3x 8 with max cuing for scapular retraction with decent carry over - Prone T 3x 10 with good carry over of scapular retraction, fatigue at end of reps - Supine lat stretch 52mn hold  MAN THER -PROM for FLEX, ABD for 3x30 sec hold with inferior and posterior glide for increasing mobility -Grade I/II mobs inferior, lateral glides for pain reduction 3 bouts for 30 sec -Trigger point release in pec major muscle 2x15 sec holds with decreased pain noted and some decreased tension noted by PT STM to scar incision 729ms abd/flex with scar massage                        PT Education - 07/24/19 1423    Education Details  therex form, goal update/POC update    Person(s) Educated  Patient    Methods  Explanation;Demonstration;Tactile cues;Verbal cues    Comprehension  Verbalized understanding;Returned demonstration;Verbal cues required;Tactile cues required       PT Short Term Goals - 07/24/19 1505      PT SHORT TERM GOAL #1   Baseline  05/25/19 7/10 pain; 06/09/2019 8/10; 07/24/19 5/10    Time  4    Period  Weeks    Status  Achieved      PT SHORT TERM GOAL #2   Title  Pt will be independent with HEP in order to improve strength and balance in order to decrease fall risk and improve function at home and work.    Baseline  05/25/19 HEP given; compliant with exercise 07/24/19    Time  4    Period  Weeks    Status  Achieved    Target Date  06/22/19        PT Long Term Goals - 07/24/19 1350      PT LONG TERM GOAL #1   Title  In 8 weeks, pt will decrease pain score by 4 points in order to improve functional ADL capability and care for young daughter.    Baseline  05/26/19 7/10 pain; 06/09/2019 8/10 pain; 07/24/19 5/10    Time  8    Period  Weeks    Status  On-going      PT LONG TERM GOAL #2   Title  In 8 weeks, pt will improve AROM of R shoulder WNL in order to perform personal grooming  and household ADLs    Baseline  05/26/19 ER: R occiput IR R PSIS Flexion 90; Abduction 82 both painful; 06/09/2019 ER: R occiput IR PSIS Flexion 100 ABD 95 painful for flexion/ABD; 03/152021 ER: C5 IR L3 ABD 90d Flex 115d    Time  8    Period  Weeks    Status  Partially Met      PT LONG TERM GOAL #3   Title  In 8 weeks, pt will improve gross R shoulder and periscapular strength to 4+/5 MMT to perform heavy household chores and provide care to young daughter .    Baseline  05/26/19 Flexion/ABD/IR 4/5; lower trapezius 2-/5; scap retractors 3/5; I position 4/5; 06/09/2019 Flexion 4+/5, ABD 4/5, IR4/5 ER 4/5, lower trap 2+/5, middle trap 3/5, extension 4-/5 07/24/19 Flexion 4+/5, ABD 4+/5, IR 4+/5 ER 4+/5, lower trap 3/5, middle trap 4/5, extension 4+/5    Time  8    Period  Weeks    Status  Partially Met      PT LONG TERM GOAL #4   Title  Patient will increase FOTO score to 60 to demonstrate predicted increase in functional mobility to complete ADLs    Baseline  05/26/19 30; 06/09/2019 34    Time  8    Period  Weeks    Status  Deferred            Plan - 07/24/19 1653    Clinical Impression Statement  Goals reasseessed this visit with patient making strong progress. AROM limitations still evident, as well as periscapular strength needed for household ADLs, but patient has improved in both of these categories. Manal techniques continue to improve ROM with patient demonstring full flexion passively following and approx 140d active overhead abd and flex (abd 90d and flex 100d prior to manual). Patinet is continuing to tolerate therex progression well with compliance of cuing for technique. PT will continue progression as able.    Personal Factors and Comorbidities  Comorbidity 3+;Past/Current Experience;Time since onset of injury/illness/exacerbation;Profession    Examination-Activity Limitations  Caring for Others;Dressing;Carry;Hygiene/Grooming;Bathing    Examination-Participation Restrictions   Cleaning;Laundry;Community Activity    Stability/Clinical Decision Making  Evolving/Moderate complexity    Clinical Decision Making  Moderate    Rehab Potential  Good    PT Frequency  2x / week    PT  Duration  8 weeks    PT Treatment/Interventions  ADLs/Self Care Home Management;Aquatic Therapy;Functional mobility training;Therapeutic activities;Therapeutic exercise;Manual techniques;Compression bandaging;Neuromuscular re-education    PT Next Visit Plan  mobility, parascapular strength    PT Home Exercise Plan  doorway pec stretch, supine snow angels, table slides (flex/abd), supine long direction pec stretch, lateral neck flexion stretch for upper trap    Consulted and Agree with Plan of Care  Patient       Patient will benefit from skilled therapeutic intervention in order to improve the following deficits and impairments:  Decreased activity tolerance, Decreased range of motion, Decreased strength, Hypomobility, Impaired sensation, Impaired UE functional use, Pain, Decreased mobility, Decreased scar mobility, Increased edema, Postural dysfunction  Visit Diagnosis: Muscle weakness (generalized)  Chronic right shoulder pain  Stiffness of right shoulder, not elsewhere classified  Abnormal posture     Problem List Patient Active Problem List   Diagnosis Date Noted  . Lymphedema of arm 12/21/2018  . Bilateral hand numbness 12/21/2018  . GERD without esophagitis 12/21/2018  . Malignant neoplasm of upper-outer quadrant of right breast in female, estrogen receptor positive ([place]) 12/07/2017  . Allergic conjunctivitis of both eyes 09/16/2017  . Family history of breast cancer 10/13/2016  . Dysplasia of cervix, low grade (CIN 1) 10/13/2016  . Thrombocytosis ([place]) 03/16/2016  . Hemoglobin C trait ([place]) 11/29/2015  . Hematuria 02/21/2015  . Vitamin D deficiency 02/21/2015  . Sterilization consult 02/21/2015  . Low back pain   . Morbid obesity ([place])   . Hyperlipidemia   .  Hypertension   . IFG (impaired fasting glucose)   . Migraines    Shelton Silvas PT, DPT Shelton Silvas 07/24/2019, 4:56 PM  [facility] [address] [place], [address] Phone: [phone]   Fax:  [phone]  Name: Mckenzie Lopez MRN: [id number] Date of Birth: [birth year]

## 2019-07-25 ENCOUNTER — Ambulatory Visit: Payer: Medicaid Other | Admitting: Occupational Therapy

## 2019-07-25 DIAGNOSIS — M25511 Pain in right shoulder: Secondary | ICD-10-CM | POA: Diagnosis not present

## 2019-07-25 DIAGNOSIS — G8929 Other chronic pain: Secondary | ICD-10-CM | POA: Diagnosis not present

## 2019-07-25 DIAGNOSIS — M25532 Pain in left wrist: Secondary | ICD-10-CM

## 2019-07-25 DIAGNOSIS — M25632 Stiffness of left wrist, not elsewhere classified: Secondary | ICD-10-CM | POA: Diagnosis not present

## 2019-07-25 DIAGNOSIS — M5441 Lumbago with sciatica, right side: Secondary | ICD-10-CM | POA: Diagnosis not present

## 2019-07-25 DIAGNOSIS — I972 Postmastectomy lymphedema syndrome: Secondary | ICD-10-CM | POA: Diagnosis not present

## 2019-07-25 DIAGNOSIS — M6281 Muscle weakness (generalized): Secondary | ICD-10-CM

## 2019-07-25 DIAGNOSIS — M25611 Stiffness of right shoulder, not elsewhere classified: Secondary | ICD-10-CM | POA: Diagnosis not present

## 2019-07-25 DIAGNOSIS — R293 Abnormal posture: Secondary | ICD-10-CM | POA: Diagnosis not present

## 2019-07-25 NOTE — Therapy (Signed)
[facility] [address], [place], [address] Phone: [phone]   Fax:  [phone]  Occupational Therapy Treatment  Patient Details  Name: Mckenzie Lopez MRN: [id number] Date of Birth: 09-07-1985 Referring Provider (OT): Delsa Grana PA   Encounter Date: 07/25/2019  OT End of Session - 07/25/19 1515    Visit Number  5    Number of Visits  10    Date for OT Re-Evaluation  08/21/19    Authorization - Visit Number  5    Authorization - Number of Visits  10    OT Start Time  9242    OT Stop Time  1438    OT Time Calculation (min)  52 min    Activity Tolerance  Patient tolerated treatment well    Behavior During Therapy  [facility] for tasks assessed/performed       Past Medical History:  Diagnosis Date  . BRCA negative 2019   BRCA with Dr. Bary Castilla, 7/20 Vistaseq panel neg with Elmo Putt Copland, PA-C  . CHF (congestive heart failure) ([place])    POSTPARTUM 2017/ RESOLVED  . Eczema   . Hemoglobin C trait ([place]) 11/29/2015  . Hyperlipidemia   . Hypertension   . IFG (impaired fasting glucose)   . Intraductal carcinoma of right breast 11/2017   ER/PR+; Her2neu neg; mets to LN  . Low back pain   . Migraines   . Morbid obesity ([place])     Past Surgical History:  Procedure Laterality Date  . AXILLARY LYMPH NODE BIOPSY Right 12/01/2017   METASTATIC MAMMARY CARCINOMA  . AXILLARY LYMPH NODE BIOPSY Left 12/01/2017   NEGATIVE FOR MALIGNANCY  . BREAST BIOPSY Right 12/01/2017   11 and 12 o'clock, INVASIVE MAMMARY CARCINOMA, ER/PR positive HER2 negative  . MASTECTOMY Right 01/05/2018    There were no vitals filed for this visit.  Subjective Assessment - 07/25/19 1510    Subjective   Pain is better over all except when I put the splint on today I have throbbing pain in my thumb -and still tender down here    Pertinent History  R modified radical mastectomy with lymph node dissection; negative margins; 5 of 21 lymph  nodes positive for metastatic carcinoma;01/31/18 Chemotherapy Initiated: Dose-dense doxorubicin-cyclophosphamide (ddAC) q2 weeks x 4 cycles followed by weekly paclitaxel (weekly-T).had radiation - then developed neuropathy form chemo - harder time doing thinkgs with her hand - tendinitis started in January 2021 in L wrist - pulling up her compression sleeve for daytime and night time , pump - doing her daughers hair    Patient Stated Goals  I want the pain better in my L wrist so I can use it to hold , pick up , squeez objects , do my daughters hair, put on my clothes and compression garments    Currently in Pain?  Yes    Pain Score  5    10/10 over distal radius head   Pain Location  Wrist    Pain Orientation  Left    Pain Descriptors / Indicators  Tender;Throbbing    Pain Type  Acute pain    Pain Onset  More than a month ago    Pain Frequency  Constant         Pt arrive with pain with thumb PA 5/10 and tenderness over 1st dorsal compartment 10/10  But other AROM less than 5/10 including wrist AROM   opposition and thumb PA no pain  Review with  pt fastening splint - top velcro was okay but bottom velcro was to tight and pushing on radial head        Pain free AROM for thumb PA and RA . Opposition to 5th  Reinforce and review with pt as well as wrist AROM flexion,ext, RD, UD, 10 reps 2 x day  After contrast -also to switch to water for cold- to make sure wrist and hand goes into ice - and not only  Radial side of wrist       OT Treatments/Exercises (OP) - 07/25/19 0001      Iontophoresis   Type of Iontophoresis  Dexamethasone    Location  left over 1st dorsal compartment    Dose  medium patch, intensity 2.0    Time  20 mins      LUE Contrast Bath   Time  9 minutes    Comments  decrease edema and pain        tolerate Ionto good - skin check done prior and pt to keep patch for hour        OT Education - 07/25/19 1515    Education Details  contrast for edema, use of  ionto, splint use    Person(s) Educated  Patient    Methods  Explanation;Demonstration;Handout    Comprehension  Verbalized understanding;Returned demonstration       OT Short Term Goals - 07/10/19 1746      OT SHORT TERM GOAL #1   Title  Pain on PRWHE improve with more than 20 points    Baseline  at eval PRWHE for pain 46/50    Time  3    Period  Weeks    Status  New    Target Date  07/31/19      OT SHORT TERM GOAL #2   Title  Pt to be independent in use of splint and HEP to decrease pain in L wrist    Baseline  pain 6-10/10 - PRWHE 46/50 - had pain in over the counter sleeve- now custom thumb spica    Time  2    Period  Weeks    Status  New    Target Date  07/24/19        OT Long Term Goals - 07/10/19 1748      OT LONG TERM GOAL #1   Title  Pain in L wrist increase for pt to wean out of custom thumb spica    Baseline  Pain on PRWHE 46/50 ., and splint all the time    Time  5    Period  Weeks    Status  New    Target Date  08/14/19      OT LONG TERM GOAL #2   Title  Pt L wrist AROM in all planes increase for pt to have not increase symptoms more than 2/10    Baseline  pain 6-10/10 , PRWHE 46/50 - at eval and increase pain with AROM and grip/prehension with use -    Time  6    Period  Weeks    Status  New    Target Date  08/21/19            Plan - 07/25/19 1516    Clinical Impression Statement  Pt cont to be tender over 1st dorsal compartment 10/10  and AROM for thumb RA 7/10 -but other AROM less than 5/10 -pt to keep AROM after contrast pain free- pt was doing AROM WNL and not  pain free - and do not make base of thumb to tight and causing pressure over distal radius - has inside and outside velcro - pain decrease to 3/10 in session with AROM    OT Occupational Profile and History  Problem Focused Assessment - Including review of records relating to presenting problem    Occupational performance deficits (Please refer to evaluation for details):   IADL's;Work;Leisure;Play;ADL's    Body Structure / Function / Physical Skills  ADL;IADL;Pain;Strength;ROM;UE functional use;Dexterity    Rehab Potential  Good    Clinical Decision Making  Limited treatment options, no task modification necessary    Comorbidities Affecting Occupational Performance:  May have comorbidities impacting occupational performance    Modification or Assistance to Complete Evaluation   Min-Moderate modification of tasks or assist with assess necessary to complete eval    OT Frequency  2x / week    OT Duration  4 weeks    OT Treatment/Interventions  Self-care/ADL training;Patient/family education;Therapeutic exercise;DME and/or AE instruction;Passive range of motion;Manual Therapy;Iontophoresis;Contrast Bath;Splinting;Cryotherapy    Plan  assess pain decrease with splint use and ionto    OT Home Exercise Plan  see pt instruction     Consulted and Agree with Plan of Care  Patient       Patient will benefit from skilled therapeutic intervention in order to improve the following deficits and impairments:   Body Structure / Function / Physical Skills: ADL, IADL, Pain, Strength, ROM, UE functional use, Dexterity       Visit Diagnosis: Muscle weakness (generalized)  Pain in left wrist  Stiffness of left wrist, not elsewhere classified    Problem List Patient Active Problem List   Diagnosis Date Noted  . Lymphedema of arm 12/21/2018  . Bilateral hand numbness 12/21/2018  . GERD without esophagitis 12/21/2018  . Malignant neoplasm of upper-outer quadrant of right breast in female, estrogen receptor positive ([place]) 12/07/2017  . Allergic conjunctivitis of both eyes 09/16/2017  . Family history of breast cancer 10/13/2016  . Dysplasia of cervix, low grade (CIN 1) 10/13/2016  . Thrombocytosis ([place]) 03/16/2016  . Hemoglobin C trait ([place]) 11/29/2015  . Hematuria 02/21/2015  . Vitamin D deficiency 02/21/2015  . Sterilization consult 02/21/2015  . Low back pain    . Morbid obesity ([place])   . Hyperlipidemia   . Hypertension   . IFG (impaired fasting glucose)   . Migraines     Nikkita Adeyemi OTR/L,CLT 07/25/2019, 3:19 PM  [facility] [address], [place], [address] Phone: [phone]   Fax:  [phone]  Name: Mckenzie Lopez MRN: [id number] Date of Birth: 1985/11/24

## 2019-07-25 NOTE — Patient Instructions (Signed)
Contrast 2 x day Pain free AROM for thumb PA and RA . Opposition to 5th  And wrist AROM flexion,ext, RD, UD, 10 reps 2 x day  Review again fastening thumb spica not to tight on inside

## 2019-07-26 ENCOUNTER — Encounter: Payer: Medicaid Other | Admitting: Physical Therapy

## 2019-07-26 DIAGNOSIS — G620 Drug-induced polyneuropathy: Secondary | ICD-10-CM | POA: Diagnosis not present

## 2019-07-26 DIAGNOSIS — T451X5A Adverse effect of antineoplastic and immunosuppressive drugs, initial encounter: Secondary | ICD-10-CM | POA: Diagnosis not present

## 2019-07-27 ENCOUNTER — Encounter: Payer: Self-pay | Admitting: Physical Therapy

## 2019-07-27 ENCOUNTER — Other Ambulatory Visit: Payer: Self-pay

## 2019-07-27 ENCOUNTER — Ambulatory Visit: Payer: Medicaid Other | Admitting: Physical Therapy

## 2019-07-27 DIAGNOSIS — M25611 Stiffness of right shoulder, not elsewhere classified: Secondary | ICD-10-CM

## 2019-07-27 DIAGNOSIS — M6281 Muscle weakness (generalized): Secondary | ICD-10-CM

## 2019-07-27 DIAGNOSIS — G8929 Other chronic pain: Secondary | ICD-10-CM | POA: Diagnosis not present

## 2019-07-27 DIAGNOSIS — R293 Abnormal posture: Secondary | ICD-10-CM

## 2019-07-27 DIAGNOSIS — M25532 Pain in left wrist: Secondary | ICD-10-CM | POA: Diagnosis not present

## 2019-07-27 DIAGNOSIS — M25511 Pain in right shoulder: Secondary | ICD-10-CM | POA: Diagnosis not present

## 2019-07-27 DIAGNOSIS — M5441 Lumbago with sciatica, right side: Secondary | ICD-10-CM | POA: Diagnosis not present

## 2019-07-27 DIAGNOSIS — I972 Postmastectomy lymphedema syndrome: Secondary | ICD-10-CM | POA: Diagnosis not present

## 2019-07-27 DIAGNOSIS — M25632 Stiffness of left wrist, not elsewhere classified: Secondary | ICD-10-CM | POA: Diagnosis not present

## 2019-07-27 NOTE — Therapy (Signed)
[facility] [address], [place], [address] Phone: [phone]   Fax:  [phone]  Physical Therapy Treatment/Progress Note Reporting Period 07/22/19 - 07/27/19  Patient Details  Name: Mckenzie Lopez MRN: [id number] Date of Birth: 05-May-1986 Referring Provider (PT): [name] PA (Oncology)   Encounter Date: 07/27/2019  PT End of Session - 07/27/19 1234    Visit Number  13    Number of Visits  17    Date for PT Re-Evaluation  09/04/19    Authorization Type  [place] Medicaid    Authorization Time Period  06/16/2019-07/27/2019    Authorization - Visit Number  9    Authorization - Number of Visits  12    PT Start Time  1030    PT Stop Time  1115    PT Time Calculation (min)  45 min    Activity Tolerance  Patient tolerated treatment well    Behavior During Therapy  [facility] for tasks assessed/performed       Past Medical History:  Diagnosis Date  . BRCA negative 2019   BRCA with Dr. Bary Castilla, 7/20 Vistaseq panel neg with Elmo Putt Copland, PA-C  . CHF (congestive heart failure) ([place])    POSTPARTUM 2017/ RESOLVED  . Eczema   . Hemoglobin C trait ([place]) 11/29/2015  . Hyperlipidemia   . Hypertension   . IFG (impaired fasting glucose)   . Intraductal carcinoma of right breast 11/2017   ER/PR+; Her2neu neg; mets to LN  . Low back pain   . Migraines   . Morbid obesity ([place])     Past Surgical History:  Procedure Laterality Date  . AXILLARY LYMPH NODE BIOPSY Right 12/01/2017   METASTATIC MAMMARY CARCINOMA  . AXILLARY LYMPH NODE BIOPSY Left 12/01/2017   NEGATIVE FOR MALIGNANCY  . BREAST BIOPSY Right 12/01/2017   11 and 12 o'clock, INVASIVE MAMMARY CARCINOMA, ER/PR positive HER2 negative  . MASTECTOMY Right 01/05/2018    There were no vitals filed for this visit.    MAN THER -PROM for FLEX, ABD for 3x30 sec hold with inferior and posterior glide for increasing mobility -Grade I/II mobs  inferior, lateral glides for pain reduction 3 bouts for 30 sec -Trigger point release in pec major muscle 2x15 sec holds with decreased pain noted and some decreased tension noted by PT STM to scar incision 74mnsabd/flex with scar massage   Ther-Ex Patient is able to demonstrate a set of the following (except pulleys which patient verbalized understanding of). Pt requires cuing for correction of technique, and is able to carry over all cuing for proper technique. Patient verbalizes understanding of all frequency, rep/set range, hold time, and when/how to increase/decrease intensity as able.   Access Code: [email or web address]:  Prone Scapular Retraction Arms at Side - 1 x daily - 3 x weekly - 3 sets - 10 reps  Prone Scapular Retraction Y - 1 x daily - 3 x weekly - 3 sets - 10 reps  Supine Shoulder Flexion with Free Weight - 1 x daily - 3 x weekly - 3 sets - 10 reps  Sidelying Shoulder Abduction - 1 x daily - 3 x weekly - 3 sets - 10 reps  Seated Bilateral Shoulder Scaption with Dumbbells - 1 x daily - 3 x weekly - 3 sets - 10 reps  Seated Overhead Press with Dumbbells - 1 x daily - 3 x weekly - 3 sets - 10 reps  Prone Chest Stretch on Chair - 2  x daily - 7 x weekly - 60sec hold  Doorway Pec Stretch at 60 Degrees Abduction with Arm Straight - 2 x daily - 7 x weekly - 60sec hold  Seated Shoulder Flexion AAROM with Pulley Behind - 2 x daily - 7 x weekly - 15 reps - 3-5 hold  Seated Shoulder Abduction AAROM with Pulley Behind - 2 x daily - 7 x weekly - 15 reps - 3-5 hold  Supine Shoulder External Rotation in 45 Degrees Abduction AAROM with Dowel - 2 x daily - 7 x weekly - 15 reps - 3-5 hold                        PT Education - 07/27/19 1233    Education Details  therex form, HEP update, goal review    Person(s) Educated  Patient    Methods  Explanation;Demonstration;Tactile cues;Verbal cues;Handout    Comprehension  Verbalized understanding;Returned demonstration;Verbal cues  required;Tactile cues required       PT Short Term Goals - 07/27/19 1036      PT SHORT TERM GOAL #1   Title  In 4 weeks, pt will decrease pain score by 2 point in order to make progress towards demonstrating clinically significant improvement in pain levels.    Baseline  05/25/19 7/10 pain; 06/09/2019 8/10; 07/24/19 5/10    Time  4    Period  Weeks    Status  Achieved    Target Date  06/22/19      PT SHORT TERM GOAL #2   Title  Pt will be independent with HEP in order to improve strength and balance in order to decrease fall risk and improve function at home and work.    Baseline  05/25/19 HEP given; compliant with exercise 07/24/19    Time  4    Period  Weeks    Status  Achieved        PT Long Term Goals - 07/27/19 1036      PT LONG TERM GOAL #1   Title  In 8 weeks, pt will decrease pain score by 4 points in order to improve functional ADL capability and care for young daughter.    Baseline  05/26/19 7/10 pain; 06/09/2019 8/10 pain; 07/24/19 5/10; 07/27/19 4/10    Time  8    Period  Weeks    Status  Achieved      PT LONG TERM GOAL #2   Title  In 8 weeks, pt will improve AROM of R shoulder WNL in order to perform personal grooming and household ADLs    Baseline  05/26/19 ER: R occiput IR R PSIS Flexion 90; Abduction 82 both painful; 06/09/2019 ER: R occiput IR PSIS Flexion 100 ABD 95 painful for flexion/ABD; 03/152021 ER: C5 IR L3 ABD 90d Flex 115d    Time  8    Period  Weeks    Status  Deferred      PT LONG TERM GOAL #3   Title  In 8 weeks, pt will improve gross R shoulder and periscapular strength to 4+/5 MMT to perform heavy household chores and provide care to young daughter .    Baseline  05/26/19 Flexion/ABD/IR 4/5; lower trapezius 2-/5; scap retractors 3/5; I position 4/5; 06/09/2019 Flexion 4+/5, ABD 4/5, IR4/5 ER 4/5, lower trap 2+/5, middle trap 3/5, extension 4-/5 07/24/19 Flexion 4+/5, ABD 4+/5, IR 4+/5 ER 4+/5, lower trap 3/5, middle trap 4/5, extension 4+/5    Time  8  Period  Weeks    Status  Deferred      PT LONG TERM GOAL #4   Title  Patient will increase FOTO score to 60 to demonstrate predicted increase in functional mobility to complete ADLs    Baseline  05/26/19 30; 06/09/2019 34 07/27/19 48    Time  8    Period  Weeks    Status  On-going            Plan - 07/27/19 1234    Clinical Impression Statement  Patient is continuing to progress toward goals and increase ROM through manual and therex. As patient is running out of appts d/t insurance restricitons robust HEP is reviewed to attempt to maintain motion and strenght made through PT. Patient does continue to have need for skilled services for manual techniques, and continued cuing needed for therex form. PT will continue progression as able to improve ADL functioning and be able to care for small child.    Personal Factors and Comorbidities  Comorbidity 3+;Past/Current Experience;Time since onset of injury/illness/exacerbation;Profession    Examination-Activity Limitations  Caring for Others;Dressing;Carry;Hygiene/Grooming;Bathing    Examination-Participation Restrictions  Cleaning;Laundry;Community Activity    Stability/Clinical Decision Making  Evolving/Moderate complexity    Clinical Decision Making  Moderate    Rehab Potential  Good    PT Frequency  2x / week    PT Duration  8 weeks    PT Treatment/Interventions  ADLs/Self Care Home Management;Aquatic Therapy;Functional mobility training;Therapeutic activities;Therapeutic exercise;Manual techniques;Compression bandaging;Neuromuscular re-education    PT Next Visit Plan  mobility, parascapular strength    PT Home Exercise Plan  doorway pec stretch, supine snow angels, table slides (flex/abd), supine long direction pec stretch, lateral neck flexion stretch for upper trap    Consulted and Agree with Plan of Care  Patient       Patient will benefit from skilled therapeutic intervention in order to improve the following deficits and  impairments:  Decreased activity tolerance, Decreased range of motion, Decreased strength, Hypomobility, Impaired sensation, Impaired UE functional use, Pain, Decreased mobility, Decreased scar mobility, Increased edema, Postural dysfunction  Visit Diagnosis: Muscle weakness (generalized)  Abnormal posture  Stiffness of right shoulder, not elsewhere classified     Problem List Patient Active Problem List   Diagnosis Date Noted  . Lymphedema of arm 12/21/2018  . Bilateral hand numbness 12/21/2018  . GERD without esophagitis 12/21/2018  . Malignant neoplasm of upper-outer quadrant of right breast in female, estrogen receptor positive ([place]) 12/07/2017  . Allergic conjunctivitis of both eyes 09/16/2017  . Family history of breast cancer 10/13/2016  . Dysplasia of cervix, low grade (CIN 1) 10/13/2016  . Thrombocytosis ([place]) 03/16/2016  . Hemoglobin C trait ([place]) 11/29/2015  . Hematuria 02/21/2015  . Vitamin D deficiency 02/21/2015  . Sterilization consult 02/21/2015  . Low back pain   . Morbid obesity ([place])   . Hyperlipidemia   . Hypertension   . IFG (impaired fasting glucose)   . Migraines    Shelton Silvas PT, DPT Shelton Silvas 07/27/2019, 12:39 PM  [facility] [address], [place], [address] Phone: [phone]   Fax:  [phone]  Name: Ciani Damani Rando MRN: [id number] Date of Birth: [birth year]

## 2019-07-28 ENCOUNTER — Ambulatory Visit: Payer: Medicaid Other | Admitting: Occupational Therapy

## 2019-07-28 DIAGNOSIS — M25532 Pain in left wrist: Secondary | ICD-10-CM | POA: Diagnosis not present

## 2019-07-28 DIAGNOSIS — M5441 Lumbago with sciatica, right side: Secondary | ICD-10-CM | POA: Diagnosis not present

## 2019-07-28 DIAGNOSIS — R293 Abnormal posture: Secondary | ICD-10-CM | POA: Diagnosis not present

## 2019-07-28 DIAGNOSIS — M25632 Stiffness of left wrist, not elsewhere classified: Secondary | ICD-10-CM | POA: Diagnosis not present

## 2019-07-28 DIAGNOSIS — M25511 Pain in right shoulder: Secondary | ICD-10-CM | POA: Diagnosis not present

## 2019-07-28 DIAGNOSIS — I972 Postmastectomy lymphedema syndrome: Secondary | ICD-10-CM | POA: Diagnosis not present

## 2019-07-28 DIAGNOSIS — M6281 Muscle weakness (generalized): Secondary | ICD-10-CM | POA: Diagnosis not present

## 2019-07-28 DIAGNOSIS — M25611 Stiffness of right shoulder, not elsewhere classified: Secondary | ICD-10-CM | POA: Diagnosis not present

## 2019-07-28 DIAGNOSIS — G8929 Other chronic pain: Secondary | ICD-10-CM | POA: Diagnosis not present

## 2019-07-28 NOTE — Therapy (Signed)
[place] [facility] [address], [place], [address] Phone: [phone]   Fax:  [phone]  Occupational Therapy Treatment  Patient Details  Name: Mckenzie Lopez MRN: [id number] Date of Birth: 11/14/1985 Referring Provider (OT): Delsa Grana PA   Encounter Date: 07/28/2019  OT End of Session - 07/28/19 1340    Visit Number  6    Number of Visits  10    Date for OT Re-Evaluation  08/21/19    Authorization - Visit Number  6    Authorization - Number of Visits  10    OT Start Time  0835    OT Stop Time  0928    OT Time Calculation (min)  53 min    Activity Tolerance  Patient tolerated treatment well    Behavior During Therapy  [facility] for tasks assessed/performed       Past Medical History:  Diagnosis Date  . BRCA negative 2019   BRCA with Dr. Bary Castilla, 7/20 Vistaseq panel neg with Elmo Putt Copland, PA-C  . CHF (congestive heart failure) ([place])    POSTPARTUM 2017/ RESOLVED  . Eczema   . Hemoglobin C trait ([place]) 11/29/2015  . Hyperlipidemia   . Hypertension   . IFG (impaired fasting glucose)   . Intraductal carcinoma of right breast 11/2017   ER/PR+; Her2neu neg; mets to LN  . Low back pain   . Migraines   . Morbid obesity ([place])     Past Surgical History:  Procedure Laterality Date  . AXILLARY LYMPH NODE BIOPSY Right 12/01/2017   METASTATIC MAMMARY CARCINOMA  . AXILLARY LYMPH NODE BIOPSY Left 12/01/2017   NEGATIVE FOR MALIGNANCY  . BREAST BIOPSY Right 12/01/2017   11 and 12 o'clock, INVASIVE MAMMARY CARCINOMA, ER/PR positive HER2 negative  . MASTECTOMY Right 01/05/2018    There were no vitals filed for this visit.  Subjective Assessment - 07/28/19 1338    Subjective   I did loosen up on the velcro on splint and pain much better- no pain in splint and down to 3/10 at the worse -and on the side little of that bone at the wrist    Pertinent History  R modified radical mastectomy with lymph node  dissection; negative margins; 5 of 21 lymph nodes positive for metastatic carcinoma;01/31/18 Chemotherapy Initiated: Dose-dense doxorubicin-cyclophosphamide (ddAC) q2 weeks x 4 cycles followed by weekly paclitaxel (weekly-T).had radiation - then developed neuropathy form chemo - harder time doing thinkgs with her hand - tendinitis started in January 2021 in L wrist - pulling up her compression sleeve for daytime and night time , pump - doing her daughers hair    Patient Stated Goals  I want the pain better in my L wrist so I can use it to hold , pick up , squeez objects , do my daughters hair, put on my clothes and compression garments    Currently in Pain?  Yes    Pain Location  Wrist    Pain Orientation  Left    Pain Descriptors / Indicators  Tender;Aching    Pain Type  Acute pain    Aggravating Factors   with thumb RA with resistance         Pt arrive with pain  Decrease greatly from last time- did loosen on strap on bottom for splint -and that helped greatly  AROM with RA with resistance 3/10 and and tenderness over 1st dorsal compartment 3/10  NO pain with AROM of wrist in all  planes   opposition and thumb PA no pain    Pt to cont pain free AROM for thumb PA and RA . Opposition to 5th  Reinforce and review with pt as well as wrist AROM flexion,ext, RD, UD, 10 reps 2 x day  After contrast -also to switch to water for cold- to make sure wrist and hand goes into ice - and not only  Radial side of wrist  Done graston tool  nr 2 sweeping over dorsal and volar forearm and wrist- soft tissue by OT in webspace and MC spreads and over radial wrist            skin check done prior - no issues - pt tolerate ionto well   OT Treatments/Exercises (OP) - 07/28/19 0001      Iontophoresis   Type of Iontophoresis  Dexamethasone    Location  left over 1st dorsal compartment    Dose  medium patch, intensity 2.0    Time  20 mins      LUE Contrast Bath   Time  9 minutes    Comments  decrease  edema and pain              OT Education - 07/28/19 1340    Education Details  HEP to cont with and splint fastening    Person(s) Educated  Patient    Methods  Explanation;Demonstration;Handout    Comprehension  Verbalized understanding;Returned demonstration       OT Short Term Goals - 07/10/19 1746      OT SHORT TERM GOAL #1   Title  Pain on PRWHE improve with more than 20 points    Baseline  at eval PRWHE for pain 46/50    Time  3    Period  Weeks    Status  New    Target Date  07/31/19      OT SHORT TERM GOAL #2   Title  Pt to be independent in use of splint and HEP to decrease pain in L wrist    Baseline  pain 6-10/10 - PRWHE 46/50 - had pain in over the counter sleeve- now custom thumb spica    Time  2    Period  Weeks    Status  New    Target Date  07/24/19        OT Long Term Goals - 07/10/19 1748      OT LONG TERM GOAL #1   Title  Pain in L wrist increase for pt to wean out of custom thumb spica    Baseline  Pain on PRWHE 46/50 ., and splint all the time    Time  5    Period  Weeks    Status  New    Target Date  08/14/19      OT LONG TERM GOAL #2   Title  Pt L wrist AROM in all planes increase for pt to have not increase symptoms more than 2/10    Baseline  pain 6-10/10 , PRWHE 46/50 - at eval and increase pain with AROM and grip/prehension with use -    Time  6    Period  Weeks    Status  New    Target Date  08/21/19            Plan - 07/28/19 1341    Clinical Impression Statement  Pt show this date great improvement in pain at 1st dorsal compartment and distal radius head- pt pain decrease  to 3/1 with tenderness and thumb RA with resistance - pain free AROM of wrist and thumb PA , flexion - cont to make progress to decrease pain    OT Occupational Profile and History  Problem Focused Assessment - Including review of records relating to presenting problem    Occupational performance deficits (Please refer to evaluation for details):   IADL's;Work;Leisure;Play;ADL's    Body Structure / Function / Physical Skills  ADL;IADL;Pain;Strength;ROM;UE functional use;Dexterity    Rehab Potential  Good    Clinical Decision Making  Limited treatment options, no task modification necessary    Comorbidities Affecting Occupational Performance:  May have comorbidities impacting occupational performance    Modification or Assistance to Complete Evaluation   Min-Moderate modification of tasks or assist with assess necessary to complete eval    OT Frequency  2x / week    OT Duration  4 weeks    OT Treatment/Interventions  Self-care/ADL training;Patient/family education;Therapeutic exercise;DME and/or AE instruction;Passive range of motion;Manual Therapy;Iontophoresis;Contrast Bath;Splinting;Cryotherapy    Plan  assess pain decrease with splint use and ionto    OT Home Exercise Plan  see pt instruction     Consulted and Agree with Plan of Care  Patient       Patient will benefit from skilled therapeutic intervention in order to improve the following deficits and impairments:   Body Structure / Function / Physical Skills: ADL, IADL, Pain, Strength, ROM, UE functional use, Dexterity       Visit Diagnosis: Pain in left wrist  Stiffness of left wrist, not elsewhere classified  Muscle weakness (generalized)    Problem List Patient Active Problem List   Diagnosis Date Noted  . Lymphedema of arm 12/21/2018  . Bilateral hand numbness 12/21/2018  . GERD without esophagitis 12/21/2018  . Malignant neoplasm of upper-outer quadrant of right breast in female, estrogen receptor positive ([place]) 12/07/2017  . Allergic conjunctivitis of both eyes 09/16/2017  . Family history of breast cancer 10/13/2016  . Dysplasia of cervix, low grade (CIN 1) 10/13/2016  . Thrombocytosis ([place]) 03/16/2016  . Hemoglobin C trait ([place]) 11/29/2015  . Hematuria 02/21/2015  . Vitamin D deficiency 02/21/2015  . Sterilization consult 02/21/2015  . Low back pain    . Morbid obesity ([place])   . Hyperlipidemia   . Hypertension   . IFG (impaired fasting glucose)   . Migraines     Alta Shober OTR/L,CLT 07/28/2019, 1:45 PM  [facility] [address] [place], [address] Phone: [phone]   Fax:  [phone]  Name: Adhira Kristain Filo MRN: [id number] Date of Birth: [birth year]

## 2019-07-28 NOTE — Patient Instructions (Signed)
same

## 2019-08-01 ENCOUNTER — Other Ambulatory Visit: Payer: Self-pay

## 2019-08-01 ENCOUNTER — Ambulatory Visit: Payer: Medicaid Other | Admitting: Occupational Therapy

## 2019-08-01 DIAGNOSIS — R293 Abnormal posture: Secondary | ICD-10-CM | POA: Diagnosis not present

## 2019-08-01 DIAGNOSIS — M25532 Pain in left wrist: Secondary | ICD-10-CM

## 2019-08-01 DIAGNOSIS — M6281 Muscle weakness (generalized): Secondary | ICD-10-CM | POA: Diagnosis not present

## 2019-08-01 DIAGNOSIS — M25611 Stiffness of right shoulder, not elsewhere classified: Secondary | ICD-10-CM | POA: Diagnosis not present

## 2019-08-01 DIAGNOSIS — M25511 Pain in right shoulder: Secondary | ICD-10-CM | POA: Diagnosis not present

## 2019-08-01 DIAGNOSIS — M25632 Stiffness of left wrist, not elsewhere classified: Secondary | ICD-10-CM | POA: Diagnosis not present

## 2019-08-01 DIAGNOSIS — M5441 Lumbago with sciatica, right side: Secondary | ICD-10-CM | POA: Diagnosis not present

## 2019-08-01 DIAGNOSIS — I972 Postmastectomy lymphedema syndrome: Secondary | ICD-10-CM | POA: Diagnosis not present

## 2019-08-01 DIAGNOSIS — G8929 Other chronic pain: Secondary | ICD-10-CM | POA: Diagnosis not present

## 2019-08-01 NOTE — Patient Instructions (Signed)
Add stabilization to thumb -waving wrist and hand into UD  10 reps  isometric for thumb RA and retromassage - 10 reps  Pain free

## 2019-08-01 NOTE — Therapy (Signed)
[facility] [address] [place], [address] Phone: [phone]   Fax:  [phone]  Occupational Therapy Treatment  Patient Details  Name: Mckenzie Lopez MRN: [id number] Date of Birth: [birth year] Referring Provider (OT): Delsa Grana PA   Encounter Date: 08/01/2019  OT End of Session - 08/01/19 1207    Visit Number  7    Number of Visits  10    Date for OT Re-Evaluation  08/21/19    Authorization - Visit Number  6    Authorization - Number of Visits  10    OT Start Time  2409    OT Stop Time  1213    OT Time Calculation (min)  58 min    Activity Tolerance  Patient tolerated treatment well    Behavior During Therapy  [facility] for tasks assessed/performed       Past Medical History:  Diagnosis Date  . BRCA negative 2019   BRCA with Dr. Bary Castilla, 7/20 Vistaseq panel neg with Elmo Putt Copland, PA-C  . CHF (congestive heart failure) ([place])    POSTPARTUM 2017/ RESOLVED  . Eczema   . Hemoglobin C trait ([place]) 11/29/2015  . Hyperlipidemia   . Hypertension   . IFG (impaired fasting glucose)   . Intraductal carcinoma of right breast 11/2017   ER/PR+; Her2neu neg; mets to LN  . Low back pain   . Migraines   . Morbid obesity ([place])     Past Surgical History:  Procedure Laterality Date  . AXILLARY LYMPH NODE BIOPSY Right 12/01/2017   METASTATIC MAMMARY CARCINOMA  . AXILLARY LYMPH NODE BIOPSY Left 12/01/2017   NEGATIVE FOR MALIGNANCY  . BREAST BIOPSY Right 12/01/2017   11 and 12 o'clock, INVASIVE MAMMARY CARCINOMA, ER/PR positive HER2 negative  . MASTECTOMY Right 01/05/2018    There were no vitals filed for this visit.  Subjective Assessment - 08/01/19 1132    Subjective   Feels better - no pain in the splint - and only little bit when using it    Pertinent History  R modified radical mastectomy with lymph node dissection; negative margins; 5 of 21 lymph nodes positive for metastatic carcinoma;01/31/18  Chemotherapy Initiated: Dose-dense doxorubicin-cyclophosphamide (ddAC) q2 weeks x 4 cycles followed by weekly paclitaxel (weekly-T).had radiation - then developed neuropathy form chemo - harder time doing thinkgs with her hand - tendinitis started in January 2021 in L wrist - pulling up her compression sleeve for daytime and night time , pump - doing her daughers hair    Patient Stated Goals  I want the pain better in my L wrist so I can use it to hold , pick up , squeez objects , do my daughters hair, put on my clothes and compression garments    Currently in Pain?  Yes    Pain Score  1     Pain Location  Hand    Pain Orientation  Left    Pain Descriptors / Indicators  Aching    Pain Type  Acute pain    Pain Onset  More than a month ago       Pt tenderness over distal radius head 3/10 -and no pain with AROM in thumb and wrist   1/10 with resistance to Thumb RA  AROM WNL  Pt ed on new HEP:    stabilization to thumb -waving wrist and hand into UD  10 reps  isometric for thumb RA and retromassage - 10 reps  Pain  free            skin check done prior and pt to keep medication patch on for hour   OT Treatments/Exercises (OP) - 08/01/19 0001      Iontophoresis   Type of Iontophoresis  Dexamethasone    Location  left over 1st dorsal compartment    Dose  medium patch, intensity 2.0    Time  20 mins      LUE Contrast Bath   Time  8 minutes    Comments  decrease pain and edema, increase ROM         Done graston tool  nr 2 sweeping over dorsal and volar forearm and wrist- soft tissue by OT in webspace and MC spreads and over radial wrist    Pain free AROM in thumb and wrist afterwards       OT Education - 08/01/19 1207    Education Details  HEP to cont with and splint fastening    Person(s) Educated  Patient    Methods  Explanation;Demonstration;Handout    Comprehension  Verbalized understanding;Returned demonstration       OT Short Term Goals - 07/10/19 1746       OT SHORT TERM GOAL #1   Title  Pain on PRWHE improve with more than 20 points    Baseline  at eval PRWHE for pain 46/50    Time  3    Period  Weeks    Status  New    Target Date  07/31/19      OT SHORT TERM GOAL #2   Title  Pt to be independent in use of splint and HEP to decrease pain in L wrist    Baseline  pain 6-10/10 - PRWHE 46/50 - had pain in over the counter sleeve- now custom thumb spica    Time  2    Period  Weeks    Status  New    Target Date  07/24/19        OT Long Term Goals - 07/10/19 1748      OT LONG TERM GOAL #1   Title  Pain in L wrist increase for pt to wean out of custom thumb spica    Baseline  Pain on PRWHE 46/50 ., and splint all the time    Time  5    Period  Weeks    Status  New    Target Date  08/14/19      OT LONG TERM GOAL #2   Title  Pt L wrist AROM in all planes increase for pt to have not increase symptoms more than 2/10    Baseline  pain 6-10/10 , PRWHE 46/50 - at eval and increase pain with AROM and grip/prehension with use -    Time  6    Period  Weeks    Status  New    Target Date  08/21/19            Plan - 08/01/19 1208    Clinical Impression Statement  Pt is showing decrease pain and tenderness over distal radius head  , pain only about 1/10 wtih thumb RA with resistance - add this date isometric but pt to  keep pain free    OT Occupational Profile and History  Problem Focused Assessment - Including review of records relating to presenting problem    Occupational performance deficits (Please refer to evaluation for details):  IADL's;Work;Leisure;Play;ADL's    Body Structure / Function / Physical  Skills  ADL;IADL;Pain;Strength;ROM;UE functional use;Dexterity    Rehab Potential  Good    Clinical Decision Making  Limited treatment options, no task modification necessary    Comorbidities Affecting Occupational Performance:  May have comorbidities impacting occupational performance    Modification or Assistance to Complete  Evaluation   Min-Moderate modification of tasks or assist with assess necessary to complete eval    OT Frequency  2x / week    OT Duration  --   3wks   OT Treatment/Interventions  Self-care/ADL training;Patient/family education;Therapeutic exercise;DME and/or AE instruction;Passive range of motion;Manual Therapy;Iontophoresis;Contrast Bath;Splinting;Cryotherapy    Plan  assess pain decrease with splint use and ionto    OT Home Exercise Plan  see pt instruction     Consulted and Agree with Plan of Care  Patient       Patient will benefit from skilled therapeutic intervention in order to improve the following deficits and impairments:   Body Structure / Function / Physical Skills: ADL, IADL, Pain, Strength, ROM, UE functional use, Dexterity       Visit Diagnosis: Pain in left wrist  Stiffness of left wrist, not elsewhere classified  Muscle weakness (generalized)    Problem List Patient Active Problem List   Diagnosis Date Noted  . Lymphedema of arm 12/21/2018  . Bilateral hand numbness 12/21/2018  . GERD without esophagitis 12/21/2018  . Malignant neoplasm of upper-outer quadrant of right breast in female, estrogen receptor positive ([place]) 12/07/2017  . Allergic conjunctivitis of both eyes 09/16/2017  . Family history of breast cancer 10/13/2016  . Dysplasia of cervix, low grade (CIN 1) 10/13/2016  . Thrombocytosis ([place]) 03/16/2016  . Hemoglobin C trait ([place]) 11/29/2015  . Hematuria 02/21/2015  . Vitamin D deficiency 02/21/2015  . Sterilization consult 02/21/2015  . Low back pain   . Morbid obesity ([place])   . Hyperlipidemia   . Hypertension   . IFG (impaired fasting glucose)   . Migraines     Baruch Lewers  OTR/L,CLT 08/01/2019, 12:16 PM  [facility] [address], [place], [address] Phone: [phone]   Fax:  [phone]  Name: Dejanee Deyna Carbon MRN: [id number] Date of Birth:  Feb 20, 1986

## 2019-08-02 ENCOUNTER — Encounter: Payer: Medicaid Other | Admitting: Physical Therapy

## 2019-08-03 ENCOUNTER — Ambulatory Visit: Payer: Medicaid Other | Admitting: Occupational Therapy

## 2019-08-03 ENCOUNTER — Other Ambulatory Visit: Payer: Self-pay

## 2019-08-03 DIAGNOSIS — M6281 Muscle weakness (generalized): Secondary | ICD-10-CM

## 2019-08-03 DIAGNOSIS — M25532 Pain in left wrist: Secondary | ICD-10-CM

## 2019-08-03 DIAGNOSIS — M25632 Stiffness of left wrist, not elsewhere classified: Secondary | ICD-10-CM

## 2019-08-03 DIAGNOSIS — G8929 Other chronic pain: Secondary | ICD-10-CM | POA: Diagnosis not present

## 2019-08-03 DIAGNOSIS — R293 Abnormal posture: Secondary | ICD-10-CM | POA: Diagnosis not present

## 2019-08-03 DIAGNOSIS — M25511 Pain in right shoulder: Secondary | ICD-10-CM | POA: Diagnosis not present

## 2019-08-03 DIAGNOSIS — M25611 Stiffness of right shoulder, not elsewhere classified: Secondary | ICD-10-CM | POA: Diagnosis not present

## 2019-08-03 DIAGNOSIS — M5441 Lumbago with sciatica, right side: Secondary | ICD-10-CM | POA: Diagnosis not present

## 2019-08-03 DIAGNOSIS — I972 Postmastectomy lymphedema syndrome: Secondary | ICD-10-CM | POA: Diagnosis not present

## 2019-08-03 NOTE — Therapy (Signed)
[facility] [address], [place], [address] Phone: [phone]   Fax:  [phone]  Occupational Therapy Treatment  Patient Details  Name: Mckenzie Lopez MRN: [id number] Date of Birth: 1986/03/19 Referring Provider (OT): Delsa Grana PA   Encounter Date: 08/03/2019  OT End of Session - 08/03/19 1226    Visit Number  8    Number of Visits  10    Date for OT Re-Evaluation  08/21/19    Authorization - Visit Number  7    Authorization - Number of Visits  10    OT Start Time  9924    OT Stop Time  1140    OT Time Calculation (min)  55 min    Activity Tolerance  Patient tolerated treatment well    Behavior During Therapy  [facility] for tasks assessed/performed       Past Medical History:  Diagnosis Date  . BRCA negative 2019   BRCA with Dr. Bary Castilla, 7/20 Vistaseq panel neg with Elmo Putt Copland, PA-C  . CHF (congestive heart failure) ([place])    POSTPARTUM 2017/ RESOLVED  . Eczema   . Hemoglobin C trait ([place]) 11/29/2015  . Hyperlipidemia   . Hypertension   . IFG (impaired fasting glucose)   . Intraductal carcinoma of right breast 11/2017   ER/PR+; Her2neu neg; mets to LN  . Low back pain   . Migraines   . Morbid obesity ([place])     Past Surgical History:  Procedure Laterality Date  . AXILLARY LYMPH NODE BIOPSY Right 12/01/2017   METASTATIC MAMMARY CARCINOMA  . AXILLARY LYMPH NODE BIOPSY Left 12/01/2017   NEGATIVE FOR MALIGNANCY  . BREAST BIOPSY Right 12/01/2017   11 and 12 o'clock, INVASIVE MAMMARY CARCINOMA, ER/PR positive HER2 negative  . MASTECTOMY Right 01/05/2018    There were no vitals filed for this visit.  Subjective Assessment - 08/03/19 1224    Subjective   Feels good - no pain with my exercises - new exercises and bathing /dressing    Pertinent History  R modified radical mastectomy with lymph node dissection; negative margins; 5 of 21 lymph nodes positive for metastatic  carcinoma;01/31/18 Chemotherapy Initiated: Dose-dense doxorubicin-cyclophosphamide (ddAC) q2 weeks x 4 cycles followed by weekly paclitaxel (weekly-T).had radiation - then developed neuropathy form chemo - harder time doing thinkgs with her hand - tendinitis started in January 2021 in L wrist - pulling up her compression sleeve for daytime and night time , pump - doing her daughers hair    Patient Stated Goals  I want the pain better in my L wrist so I can use it to hold , pick up , squeez objects , do my daughters hair, put on my clothes and compression garments    Currently in Pain?  Yes    Pain Score  1     Pain Location  Hand    Pain Orientation  Left    Aggravating Factors   resistance to RA of thumb         [facility] OT Assessment - 08/03/19 0001      AROM   Left Wrist Extension  74 Degrees    Left Wrist Flexion  88 Degrees    Left Wrist Radial Deviation  20 Degrees    Left Wrist Ulnar Deviation  30 Degrees      Strength   Right Hand Grip (lbs)  38    Right Hand Lateral Pinch  19 lbs  Right Hand 3 Point Pinch  15 lbs    Left Hand Grip (lbs)  48    Left Hand Lateral Pinch  17 lbs    Left Hand 3 Point Pinch  13 lbs      Left Hand AROM   L Thumb Radial ADduction/ABduction 0-55  50    L Thumb Palmar ADduction/ABduction 0-45  65       Great progress in AROM and grip/prehension strength in L hand and wrist- pt done great with upgraded HEP last time  Review last time HEP - see flow sheet with HEP  Pt to cont with same HEP - but painfree     Pt tenderness over distal radius head 3/10 -and no pain with AROM in thumb and wrist and Finkelstein negative   1/10 with resistance to Thumb RA  AROM WNL  REview new HEP from last time:    stabilization to thumb -waving wrist and hand into UD  10 reps  isometric for thumb RA and retroextention - 10 reps  Pain free     skin check done prior and pt to keep medication patch on for hour        OT Treatments/Exercises (OP) -  08/03/19 0001      Cryotherapy   Number Minutes Cryotherapy  2 Minutes    Cryotherapy Location  Hand   1st dorsal compartment    Type of Cryotherapy  Ice massage      Iontophoresis   Type of Iontophoresis  Dexamethasone    Location  left over 1st dorsal compartment    Dose  medium patch, intensity 2.0    Time  19       Wean out of splint 2 hrs on and off  3 days if pain free  3 hrs off and on 2 hrs  And then anther 3 days- if pain free  4 hrs off and 2hrs on  And still night time on splint  Pain free same HEP       OT Education - 08/03/19 1226    Education Details  HEP to cont with and splint weaning    Person(s) Educated  Patient    Methods  Explanation;Demonstration;Handout    Comprehension  Verbalized understanding;Returned demonstration       OT Short Term Goals - 07/10/19 1746      OT SHORT TERM GOAL #1   Title  Pain on PRWHE improve with more than 20 points    Baseline  at eval PRWHE for pain 46/50    Time  3    Period  Weeks    Status  New    Target Date  07/31/19      OT SHORT TERM GOAL #2   Title  Pt to be independent in use of splint and HEP to decrease pain in L wrist    Baseline  pain 6-10/10 - PRWHE 46/50 - had pain in over the counter sleeve- now custom thumb spica    Time  2    Period  Weeks    Status  New    Target Date  07/24/19        OT Long Term Goals - 07/10/19 1748      OT LONG TERM GOAL #1   Title  Pain in L wrist increase for pt to wean out of custom thumb spica    Baseline  Pain on PRWHE 46/50 ., and splint all the time    Time  5  Period  Weeks    Status  New    Target Date  08/14/19      OT LONG TERM GOAL #2   Title  Pt L wrist AROM in all planes increase for pt to have not increase symptoms more than 2/10    Baseline  pain 6-10/10 , PRWHE 46/50 - at eval and increase pain with AROM and grip/prehension with use -    Time  6    Period  Weeks    Status  New    Target Date  08/21/19            Plan - 08/03/19  1227    Clinical Impression Statement  Pt made great progress from [facility] and in last week or 2 - pain decrease to 0/10 with AROM and use during ADL's - pt can start weaning out of splint gradually if pain free -and her ROM and strength increase greatly with no pain -    OT Occupational Profile and History  Problem Focused Assessment - Including review of records relating to presenting problem    Occupational performance deficits (Please refer to evaluation for details):  IADL's;Work;Leisure;Play;ADL's    Body Structure / Function / Physical Skills  ADL;IADL;Pain;Strength;ROM;UE functional use;Dexterity    Rehab Potential  Good    Clinical Decision Making  Limited treatment options, no task modification necessary    Comorbidities Affecting Occupational Performance:  May have comorbidities impacting occupational performance    Modification or Assistance to Complete Evaluation   Min-Moderate modification of tasks or assist with assess necessary to complete eval    OT Frequency  1x / week    OT Duration  2 weeks    OT Treatment/Interventions  Self-care/ADL training;Patient/family education;Therapeutic exercise;DME and/or AE instruction;Passive range of motion;Manual Therapy;Iontophoresis;Contrast Bath;Splinting;Cryotherapy    Plan  assess if pain free iwth weaning - and new HEP    OT Home Exercise Plan  see pt instruction     Consulted and Agree with Plan of Care  Patient       Patient will benefit from skilled therapeutic intervention in order to improve the following deficits and impairments:   Body Structure / Function / Physical Skills: ADL, IADL, Pain, Strength, ROM, UE functional use, Dexterity       Visit Diagnosis: Stiffness of left wrist, not elsewhere classified  Pain in left wrist  Muscle weakness (generalized)    Problem List Patient Active Problem List   Diagnosis Date Noted  . Lymphedema of arm 12/21/2018  . Bilateral hand numbness 12/21/2018  . GERD without esophagitis  12/21/2018  . Malignant neoplasm of upper-outer quadrant of right breast in female, estrogen receptor positive ([place]) 12/07/2017  . Allergic conjunctivitis of both eyes 09/16/2017  . Family history of breast cancer 10/13/2016  . Dysplasia of cervix, low grade (CIN 1) 10/13/2016  . Thrombocytosis ([place]) 03/16/2016  . Hemoglobin C trait ([place]) 11/29/2015  . Hematuria 02/21/2015  . Vitamin D deficiency 02/21/2015  . Sterilization consult 02/21/2015  . Low back pain   . Morbid obesity ([place])   . Hyperlipidemia   . Hypertension   . IFG (impaired fasting glucose)   . Migraines     Darsh Vandevoort OTR/L,CLT 08/03/2019, 12:29 PM  [facility] [address] [place], [address] Phone: [phone]   Fax:  [phone]  Name: Soriya Hortencia Martire MRN: [id number] Date of Birth: 02-02-1986

## 2019-08-03 NOTE — Patient Instructions (Signed)
Wean out of splint 2 hrs on and off  3 days if pain free  3 hrs off and on 2 hrs  And then anther 3 days- if pain free  4 hrs off and 2hrs on  And still night time on splint  Pain free same HEP

## 2019-08-08 ENCOUNTER — Other Ambulatory Visit: Payer: Self-pay

## 2019-08-08 ENCOUNTER — Ambulatory Visit: Payer: Medicaid Other | Admitting: Occupational Therapy

## 2019-08-08 ENCOUNTER — Encounter: Payer: Self-pay | Admitting: Occupational Therapy

## 2019-08-08 DIAGNOSIS — M6281 Muscle weakness (generalized): Secondary | ICD-10-CM | POA: Diagnosis not present

## 2019-08-08 DIAGNOSIS — M25532 Pain in left wrist: Secondary | ICD-10-CM

## 2019-08-08 DIAGNOSIS — M25632 Stiffness of left wrist, not elsewhere classified: Secondary | ICD-10-CM | POA: Diagnosis not present

## 2019-08-08 DIAGNOSIS — M25611 Stiffness of right shoulder, not elsewhere classified: Secondary | ICD-10-CM | POA: Diagnosis not present

## 2019-08-08 DIAGNOSIS — M5441 Lumbago with sciatica, right side: Secondary | ICD-10-CM | POA: Diagnosis not present

## 2019-08-08 DIAGNOSIS — I972 Postmastectomy lymphedema syndrome: Secondary | ICD-10-CM | POA: Diagnosis not present

## 2019-08-08 DIAGNOSIS — G8929 Other chronic pain: Secondary | ICD-10-CM | POA: Diagnosis not present

## 2019-08-08 DIAGNOSIS — M25511 Pain in right shoulder: Secondary | ICD-10-CM | POA: Diagnosis not present

## 2019-08-08 DIAGNOSIS — R293 Abnormal posture: Secondary | ICD-10-CM | POA: Diagnosis not present

## 2019-08-08 NOTE — Therapy (Addendum)
[facility] [address], [place], [address] Phone: [phone]   Fax:  [phone]  Occupational Therapy Treatment  Patient Details  Name: Mckenzie Lopez MRN: [id number] Date of Birth: 04-17-1986 Referring Provider (OT): Delsa Grana PA   Encounter Date: 08/08/2019  OT End of Session - 08/08/19 1856    Visit Number  9    Number of Visits  10    Date for OT Re-Evaluation  08/21/19    Authorization - Visit Number  8    Authorization - Number of Visits  10    OT Start Time  3016    OT Stop Time  1103    OT Time Calculation (min)  48 min    Activity Tolerance  Patient tolerated treatment well    Behavior During Therapy  [facility] for tasks assessed/performed       Past Medical History:  Diagnosis Date  . BRCA negative 2019   BRCA with Dr. Bary Castilla, 7/20 Vistaseq panel neg with Elmo Putt Copland, PA-C  . CHF (congestive heart failure) ([place])    POSTPARTUM 2017/ RESOLVED  . Eczema   . Hemoglobin C trait ([place]) 11/29/2015  . Hyperlipidemia   . Hypertension   . IFG (impaired fasting glucose)   . Intraductal carcinoma of right breast 11/2017   ER/PR+; Her2neu neg; mets to LN  . Low back pain   . Migraines   . Morbid obesity ([place])     Past Surgical History:  Procedure Laterality Date  . AXILLARY LYMPH NODE BIOPSY Right 12/01/2017   METASTATIC MAMMARY CARCINOMA  . AXILLARY LYMPH NODE BIOPSY Left 12/01/2017   NEGATIVE FOR MALIGNANCY  . BREAST BIOPSY Right 12/01/2017   11 and 12 o'clock, INVASIVE MAMMARY CARCINOMA, ER/PR positive HER2 negative  . MASTECTOMY Right 01/05/2018    There were no vitals filed for this visit.  Subjective Assessment - 08/08/19 1846    Subjective   Patient reports she is doing well, no pain this date and no pain with exercises at home    Pertinent History  R modified radical mastectomy with lymph node dissection; negative margins; 5 of 21 lymph nodes positive for metastatic  carcinoma;01/31/18 Chemotherapy Initiated: Dose-dense doxorubicin-cyclophosphamide (ddAC) q2 weeks x 4 cycles followed by weekly paclitaxel (weekly-T).had radiation - then developed neuropathy form chemo - harder time doing thinkgs with her hand - tendinitis started in January 2021 in L wrist - pulling up her compression sleeve for daytime and night time , pump - doing her daughers hair    Patient Stated Goals  I want the pain better in my L wrist so I can use it to hold , pick up , squeez objects , do my daughters hair, put on my clothes and compression garments    Currently in Pain?  No/denies    Pain Score  0-No pain         [facility] OT Assessment - 08/08/19 1907      AROM   Right Wrist Extension  64 Degrees    Right Wrist Flexion  80 Degrees    Right Wrist Radial Deviation  21 Degrees    Right Wrist Ulnar Deviation  30 Degrees      Strength   Right Hand Grip (lbs)  40    Right Hand Lateral Pinch  19 lbs    Right Hand 3 Point Pinch  14 lbs       Following contrast (per flowsheet) Measurements taken per flow sheet  Performance of exercises with stretches, ROM and review of exercises from last session with cues for technique for  stabilization to thumb -waving wrist and hand into UD  10 reps isometric for thumb RA and retroextension - 10 reps  Pain free Splint weaning, currently at 3 hours off, 2 hours on.  Will progress to 4 hours off, 2 hours on.         OT Treatments/Exercises (OP) - 08/08/19 1855      Iontophoresis   Type of Iontophoresis  Dexamethasone    Location  left over 1st dorsal compartment    Dose  medium patch, intensity 2.0    Time  19      LUE Contrast Bath   Time  11 minutes    Comments  decrease pain and edema, increase ROM             OT Education - 08/08/19 1856    Education Details  continue with HEP and weaning from splint    Person(s) Educated  Patient    Methods  Explanation;Demonstration;Handout    Comprehension  Verbalized  understanding;Returned demonstration       OT Short Term Goals - 07/10/19 1746      OT SHORT TERM GOAL #1   Title  Pain on PRWHE improve with more than 20 points    Baseline  at eval PRWHE for pain 46/50    Time  3    Period  Weeks    Status  New    Target Date  07/31/19      OT SHORT TERM GOAL #2   Title  Pt to be independent in use of splint and HEP to decrease pain in L wrist    Baseline  pain 6-10/10 - PRWHE 46/50 - had pain in over the counter sleeve- now custom thumb spica    Time  2    Period  Weeks    Status  New    Target Date  07/24/19        OT Long Term Goals - 07/10/19 1748      OT LONG TERM GOAL #1   Title  Pain in L wrist increase for pt to wean out of custom thumb spica    Baseline  Pain on PRWHE 46/50 ., and splint all the time    Time  5    Period  Weeks    Status  New    Target Date  08/14/19      OT LONG TERM GOAL #2   Title  Pt L wrist AROM in all planes increase for pt to have not increase symptoms more than 2/10    Baseline  pain 6-10/10 , PRWHE 46/50 - at eval and increase pain with AROM and grip/prehension with use -    Time  6    Period  Weeks    Status  New    Target Date  08/21/19            Plan - 08/08/19 1858    Clinical Impression Statement  Patient continues to do well with no pain, increased ROM and now weaning off splint.  Required cues for newer exercises from last session for proper technique.  Grip continues to  improve and continues to remain pain free.  Follow up session in one week to determine further needs.    OT Occupational Profile and History  Problem Focused Assessment - Including review of records relating to presenting problem    Occupational  performance deficits (Please refer to evaluation for details):  IADL's;Work;Leisure;Play;ADL's    Body Structure / Function / Physical Skills  ADL;IADL;Pain;Strength;ROM;UE functional use;Dexterity    Rehab Potential  Good    Clinical Decision Making  Limited treatment  options, no task modification necessary    Comorbidities Affecting Occupational Performance:  May have comorbidities impacting occupational performance    Modification or Assistance to Complete Evaluation   Min-Moderate modification of tasks or assist with assess necessary to complete eval    OT Frequency  1x / week    OT Duration  2 weeks    OT Treatment/Interventions  Self-care/ADL training;Patient/family education;Therapeutic exercise;DME and/or AE instruction;Passive range of motion;Manual Therapy;Iontophoresis;Contrast Bath;Splinting;Cryotherapy    Consulted and Agree with Plan of Care  Patient       Patient will benefit from skilled therapeutic intervention in order to improve the following deficits and impairments:   Body Structure / Function / Physical Skills: ADL, IADL, Pain, Strength, ROM, UE functional use, Dexterity       Visit Diagnosis: Stiffness of left wrist, not elsewhere classified  Pain in left wrist  Muscle weakness (generalized)    Problem List Patient Active Problem List   Diagnosis Date Noted  . Lymphedema of arm 12/21/2018  . Bilateral hand numbness 12/21/2018  . GERD without esophagitis 12/21/2018  . Malignant neoplasm of upper-outer quadrant of right breast in female, estrogen receptor positive ([place]) 12/07/2017  . Allergic conjunctivitis of both eyes 09/16/2017  . Family history of breast cancer 10/13/2016  . Dysplasia of cervix, low grade (CIN 1) 10/13/2016  . Thrombocytosis ([place]) 03/16/2016  . Hemoglobin C trait ([place]) 11/29/2015  . Hematuria 02/21/2015  . Vitamin D deficiency 02/21/2015  . Sterilization consult 02/21/2015  . Low back pain   . Morbid obesity ([place])   . Hyperlipidemia   . Hypertension   . IFG (impaired fasting glucose)   . Migraines    Rowan Pollman T Tymar Polyak, OTR/L, CLT  Roberta Angell 08/08/2019, 7:11 PM  [facility] [address] [place], [address] Phone:  [phone]   Fax:  [phone]  Name: Mckenzie Lopez MRN: [id number] Date of Birth: [birth year]

## 2019-08-11 ENCOUNTER — Encounter: Payer: Medicaid Other | Admitting: Occupational Therapy

## 2019-08-13 IMAGING — US US BREAST*L* LIMITED INC AXILLA
2 series · 5 of 5 positions shown · non-contrast
Comparison: Previous exam(s).

CLINICAL DATA: 32-year-old female recalled from baseline screening
mammogram dated 11/19/2017 for right breast masses in left breast
asymmetry.

EXAM:
DIGITAL DIAGNOSTIC BILATERAL MAMMOGRAM WITH CAD AND TOMO
ULTRASOUND BILATERAL BREAST

[Series 1: us breast*left* limited inc axilla · 0.08mm/px · 3 of 3 slices shown (1 of 2)]
[im 1/3]
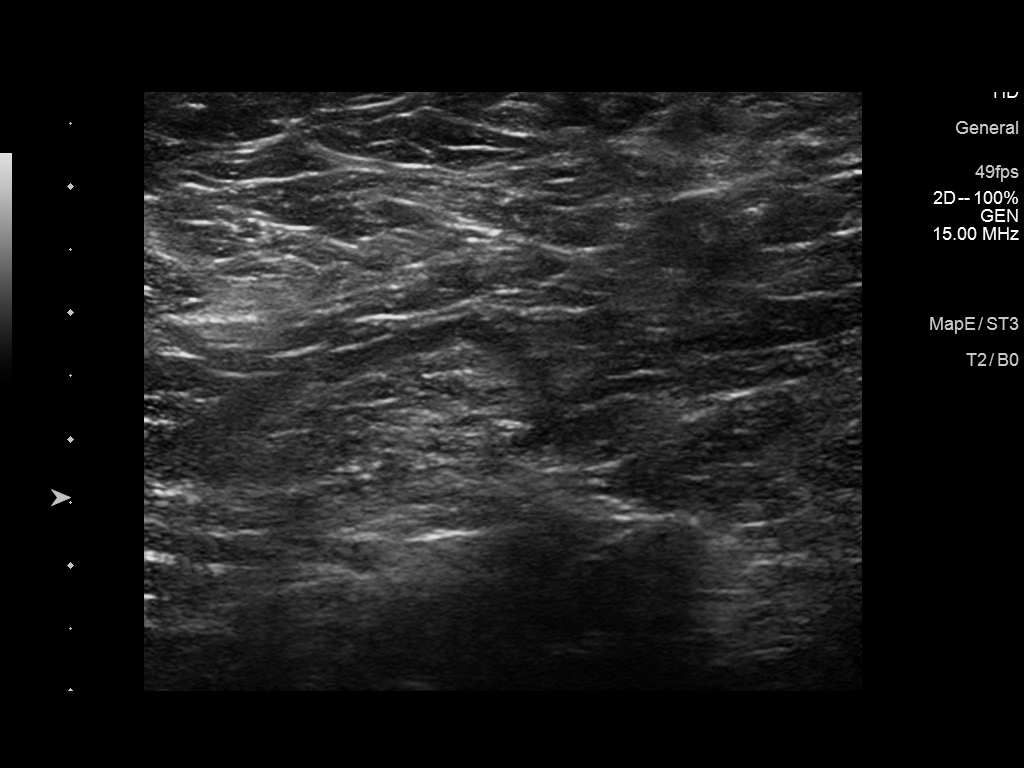
[im 2/3]
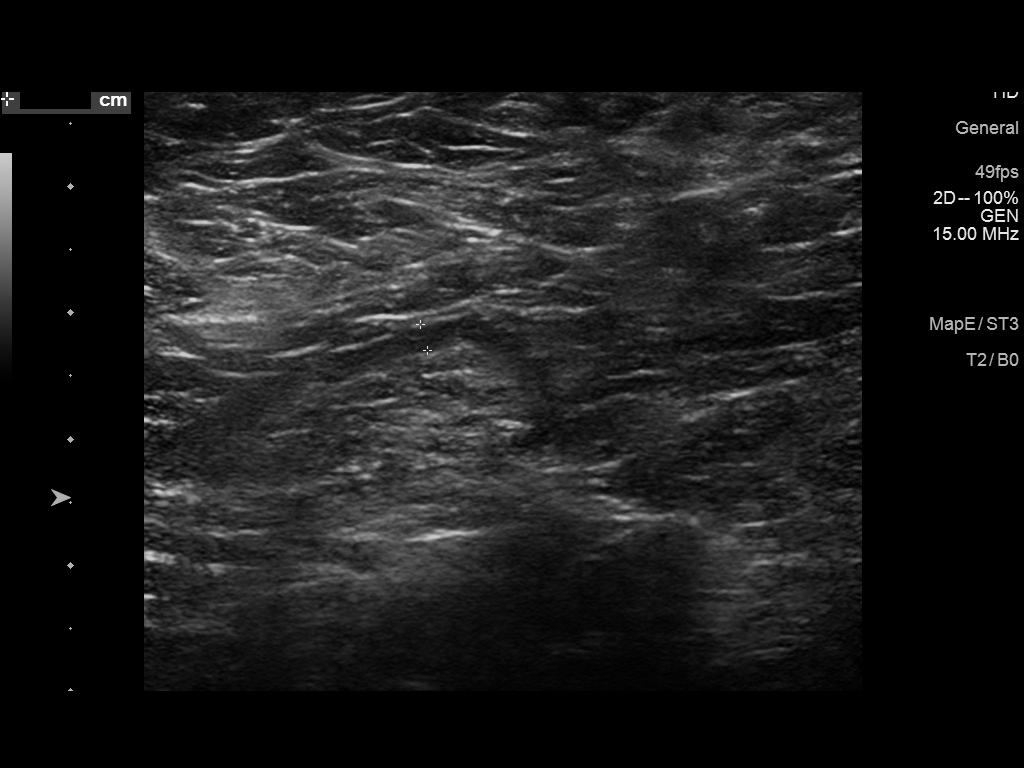
[im 3/3]
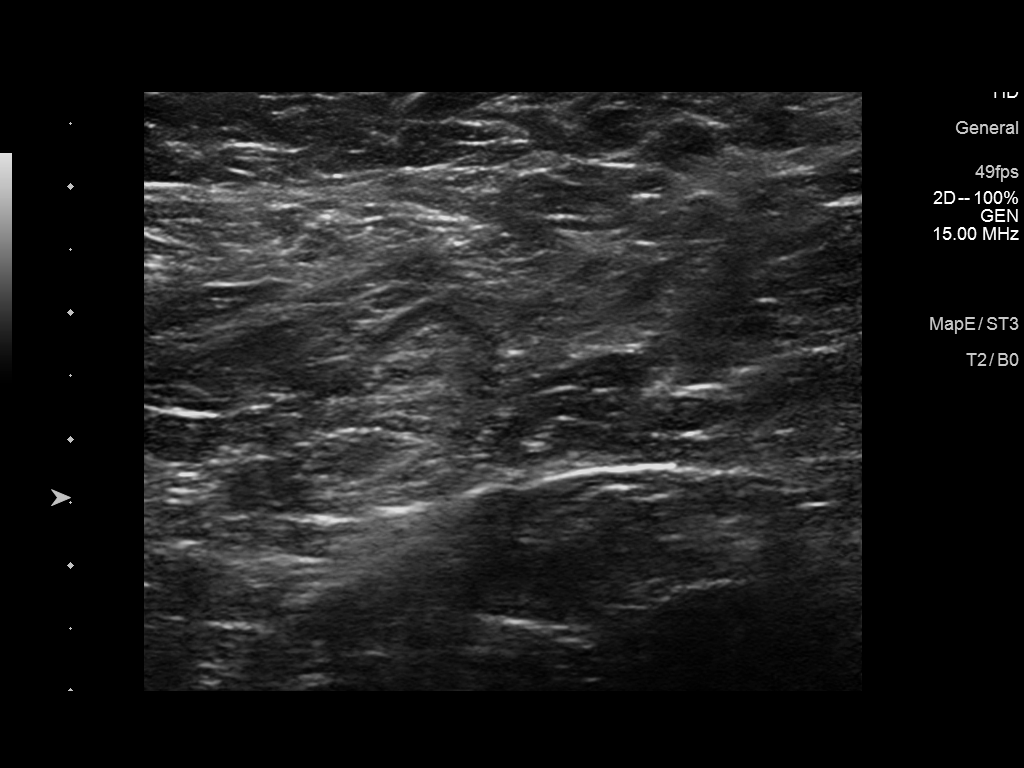

[Series 2: us breast*left* limited inc axilla · 0.07mm/px · 2 of 2 slices shown (2 of 2)]
[im 1/2]
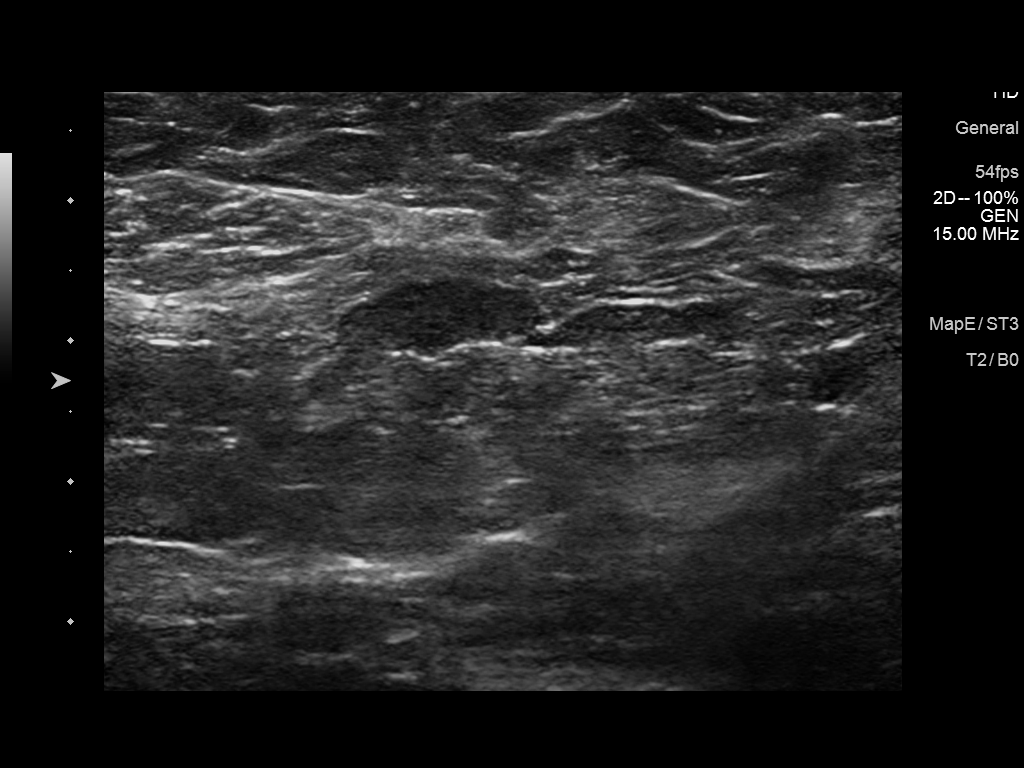
[im 2/2]
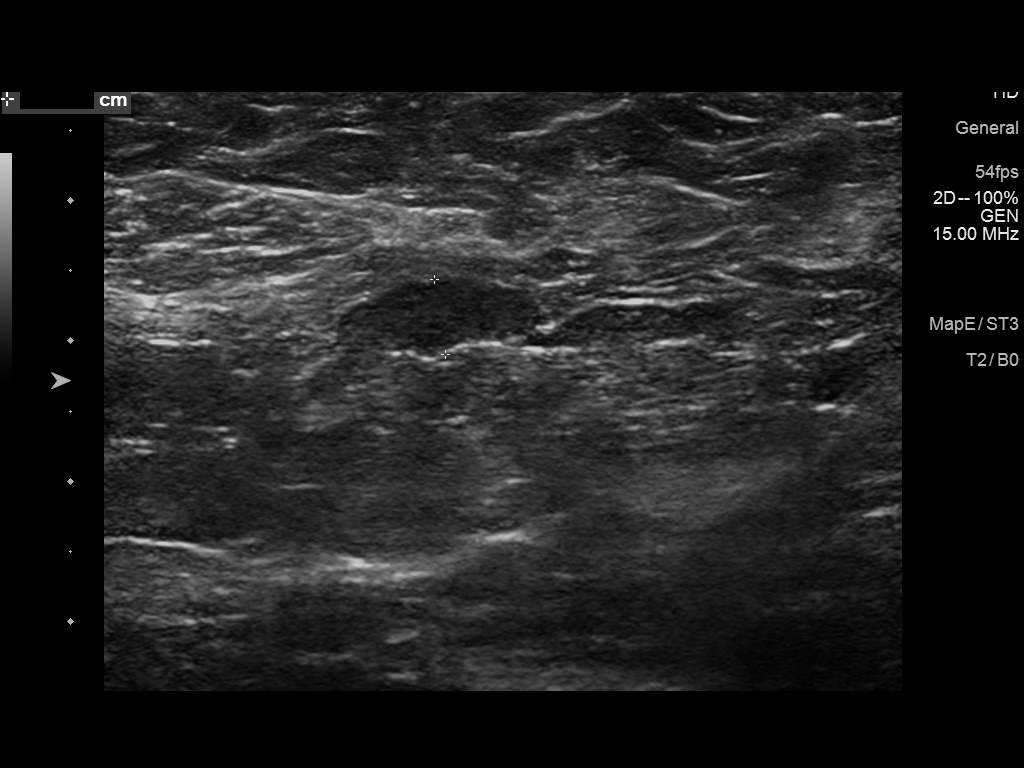

[5 of 5 positions shown; findings below may reference images not displayed]

ACR Breast Density Category b: There are scattered areas of
fibroglandular density.
FINDINGS: Additional mammographic views demonstrate hyperdense spiculated
masses in the upper outer right breast at anterior depth and middle
depth. At least 3 masses are identified mammographically. Further
evaluation with ultrasound was performed.

Additional mammographic views of the upper left breast demonstrate
dispersed mint of the previously identified asymmetry into fatty and
fibroglandular tissue. Precautionary ultrasound was performed.

Mammographic images were processed with CAD.

On physical exam, I palpate a firm 1-2 cm immobile mass in the upper
outer right breast anteriorly.

Targeted ultrasound is performed, showing an irregular hypoechoic
mass with associated vascularity at the 11 o'clock position 4 cm
from the nipple. It measures 2.1 x 1.7 x 1.6 cm. An additional
similar-appearing mass is noted at the 12 o'clock position 2 cm from
the nipple measuring 0.9 x 0.6 x 0.5 cm. A third mass is identified
at the 12 o'clock position 8 cm from the nipple measuring 1.1 x
x 1.0 cm.

Evaluation of the right axilla demonstrates morphologically abnormal
lymph nodes demonstrating up to 1.1 cm of cortical thickening.

Evaluation of the left axilla demonstrates diffuse thickening of a
single left axillary lymph node up to 0.5 cm. No additional
suspicious findings are identified in the left axilla corresponding
with the screening mammographic finding on the left.
IMPRESSION: 1. Three highly suspicious right breast masses corresponding with
the screening mammographic findings. Recommendation is for
ultrasound-guided biopsy of the masses at the 11 o'clock position 4
cm from the nipple and 12 o'clock position 8 cm from the nipple.
2. Suspicious, morphologically abnormal right axillary
lymphadenopathy. Recommendation is for ultrasound-guided biopsy.
3. Indeterminate left axillary lymphadenopathy. Recommendation is
for ultrasound-guided biopsy.

RECOMMENDATION:
Four area ultrasound-guided biopsy of 2 suspicious right breast
masses, a suspicious right axillary lymph node and an indeterminate
left axillary lymph node.

I have discussed the findings and recommendations with the patient.
Results were also provided in writing at the conclusion of the
visit. If applicable, a reminder letter will be sent to the patient
regarding the next appointment.

BI-RADS CATEGORY  5: Highly suggestive of malignancy.

## 2019-08-15 ENCOUNTER — Ambulatory Visit: Payer: Medicaid Other | Attending: Physician Assistant | Admitting: Occupational Therapy

## 2019-08-15 ENCOUNTER — Other Ambulatory Visit: Payer: Self-pay

## 2019-08-15 DIAGNOSIS — M6281 Muscle weakness (generalized): Secondary | ICD-10-CM | POA: Diagnosis not present

## 2019-08-15 DIAGNOSIS — M25532 Pain in left wrist: Secondary | ICD-10-CM | POA: Insufficient documentation

## 2019-08-15 DIAGNOSIS — M25611 Stiffness of right shoulder, not elsewhere classified: Secondary | ICD-10-CM | POA: Diagnosis not present

## 2019-08-15 DIAGNOSIS — M25511 Pain in right shoulder: Secondary | ICD-10-CM | POA: Insufficient documentation

## 2019-08-15 DIAGNOSIS — G8929 Other chronic pain: Secondary | ICD-10-CM | POA: Diagnosis not present

## 2019-08-15 DIAGNOSIS — M25632 Stiffness of left wrist, not elsewhere classified: Secondary | ICD-10-CM | POA: Diagnosis not present

## 2019-08-15 DIAGNOSIS — R293 Abnormal posture: Secondary | ICD-10-CM | POA: Insufficient documentation

## 2019-08-15 NOTE — Patient Instructions (Signed)
Pt to wear soft wrist/thumb wrap 2hrs in am and pm  As well as night time  And then in 3 days stop wearing night time  And if no issues   only wear soft neoprene with activities that will bother thumb  Rest of time off  do have the hard shell to slide on if having increase pain  Review modifications or activities to avoid that will cause symptoms to increase

## 2019-08-15 NOTE — Therapy (Signed)
[facility] [address] [place], [address] Phone: [phone]   Fax:  [phone]  Occupational Therapy Treatment  Patient Details  Name: Mckenzie Lopez MRN: [id number] Date of Birth: May 15, 1985 Referring Provider (OT): Delsa Grana PA   Encounter Date: 08/15/2019  OT End of Session - 08/15/19 1154    Visit Number  10    Number of Visits  11    Date for OT Re-Evaluation  08/29/19    Authorization - Visit Number  9    Authorization - Number of Visits  10    OT Start Time  1115    OT Stop Time  1145    OT Time Calculation (min)  30 min    Activity Tolerance  Patient tolerated treatment well    Behavior During Therapy  [facility] for tasks assessed/performed       Past Medical History:  Diagnosis Date  . BRCA negative 2019   BRCA with Dr. Bary Castilla, 7/20 Vistaseq panel neg with Elmo Putt Copland, PA-C  . CHF (congestive heart failure) ([place])    POSTPARTUM 2017/ RESOLVED  . Eczema   . Hemoglobin C trait ([place]) 11/29/2015  . Hyperlipidemia   . Hypertension   . IFG (impaired fasting glucose)   . Intraductal carcinoma of right breast 11/2017   ER/PR+; Her2neu neg; mets to LN  . Low back pain   . Migraines   . Morbid obesity ([place])     Past Surgical History:  Procedure Laterality Date  . AXILLARY LYMPH NODE BIOPSY Right 12/01/2017   METASTATIC MAMMARY CARCINOMA  . AXILLARY LYMPH NODE BIOPSY Left 12/01/2017   NEGATIVE FOR MALIGNANCY  . BREAST BIOPSY Right 12/01/2017   11 and 12 o'clock, INVASIVE MAMMARY CARCINOMA, ER/PR positive HER2 negative  . MASTECTOMY Right 01/05/2018    There were no vitals filed for this visit.  Subjective Assessment - 08/15/19 1150    Subjective   Doing well - pain at the worse was the last week at 1/10 - wearing splint only at night and middle of day for 2hrs and evening maybe hour or 2    Pertinent History  R modified radical mastectomy with lymph node dissection; negative  margins; 5 of 21 lymph nodes positive for metastatic carcinoma;01/31/18 Chemotherapy Initiated: Dose-dense doxorubicin-cyclophosphamide (ddAC) q2 weeks x 4 cycles followed by weekly paclitaxel (weekly-T).had radiation - then developed neuropathy form chemo - harder time doing thinkgs with her hand - tendinitis started in January 2021 in L wrist - pulling up her compression sleeve for daytime and night time , pump - doing her daughers hair    Patient Stated Goals  I want the pain better in my L wrist so I can use it to hold , pick up , squeez objects , do my daughters hair, put on my clothes and compression garments    Currently in Pain?  No/denies         [facility] OT Assessment - 08/15/19 0001      AROM   Right Wrist Extension  64 Degrees    Right Wrist Flexion  80 Degrees    Right Wrist Radial Deviation  20 Degrees    Right Wrist Ulnar Deviation  30 Degrees    Left Wrist Extension  70 Degrees    Left Wrist Flexion  80 Degrees    Left Wrist Radial Deviation  20 Degrees    Left Wrist Ulnar Deviation  30 Degrees      Strength  Right Hand Grip (lbs)  40    Right Hand Lateral Pinch  20 lbs    Right Hand 3 Point Pinch  17 lbs    Left Hand Grip (lbs)  48    Left Hand Lateral Pinch  25 lbs    Left Hand 3 Point Pinch  19 lbs      AROM and grip/prehension strength assess - great progress and no pain  M/M strength in wrist in all planes 5/5  able to carry and pick up 7 lbs pain free without splint -but more had some pain less than 2/10  Was only wearing thumb spica up to today only 2 hrs in middle of day and late afternoon  As well as night time  modify thumb spica to  soft neoprene wrist and thumb wrap - and to sleep and wear 2 hrs mid of day and evening  And then wean out of that at night time - in 3 days and then only on for day time with activities that cause thumb strain  Do have hard shell to slide on if any increase pain or symptoms occur prior to 2 wks check up                   OT Education - 08/15/19 1153    Education Details  progress, splint weaning and modifications to tasks    Person(s) Educated  Patient    Methods  Explanation;Demonstration;Handout    Comprehension  Verbalized understanding;Returned demonstration       OT Short Term Goals - 08/15/19 1159      OT SHORT TERM GOAL #1   Title  Pain on PRWHE improve with more than 20 points    Baseline  at eval PRWHE for pain 46/50 and now 4/50    Status  Achieved      OT SHORT TERM GOAL #2   Title  Pt to be independent in use of splint and HEP to decrease pain in L wrist    Baseline  pain 6-10/10 - PRWHE 46/50 - had pain in over the counter sleeve- now custom thumb spica - in neoprene splint night time and 15% of day , pain 1/10 and 4/50 on PRWHE    Status  Achieved        OT Long Term Goals - 08/15/19 1200      OT LONG TERM GOAL #1   Title  Pain in L wrist increase for pt to wean out of custom thumb spica    Baseline  Pain on PRWHE 46/50 ., and splint all the time until 2 wks ago - now weaned this date in soft neoprene sof night time and 15% daytime    Status  Achieved      OT LONG TERM GOAL #2   Title  Pt L wrist AROM in all planes increase for pt to have not increase symptoms more than 2/10    Baseline  AROM and grip and prehension strength great progress - see flowsheet- pain 0-1/10    Status  Achieved      OT LONG TERM GOAL #4   Title  Pt to where soft neoprene splint only with high risk actvities on L domimant wrist  and stay symptom/ pain free    Baseline  still feel pull with weight or objects more than 7lbs and wear hard spica splint night time and about 2-4 hrs total during day    Time  2    Period  Weeks    Status  New    Target Date  08/29/19            Plan - 08/15/19 1155    Clinical Impression Statement  Pt made great progress in [facility]ervain symptoms from [facility] - decrease pain with increase  AROM and grip/prehension strength in  L dominant  hand/wrist - pt weaning the past 2 wks out of hard splint and now into soft neoprene splint and pt to wean out of soft splint the next 2 wks and increase activities - pt could do object less than 7 lbs pain free    OT Occupational Profile and History  Problem Focused Assessment - Including review of records relating to presenting problem    Occupational performance deficits (Please refer to evaluation for details):  IADL's;Work;Leisure;Play;ADL's    Body Structure / Function / Physical Skills  ADL;IADL;Pain;Strength;ROM;UE functional use;Dexterity    Rehab Potential  Good    Clinical Decision Making  Limited treatment options, no task modification necessary    Comorbidities Affecting Occupational Performance:  May have comorbidities impacting occupational performance    Modification or Assistance to Complete Evaluation   Min-Moderate modification of tasks or assist with assess necessary to complete eval    OT Frequency  Biweekly    OT Duration  2 weeks    OT Treatment/Interventions  Self-care/ADL training;Patient/family education;Therapeutic exercise;DME and/or AE instruction;Passive range of motion;Manual Therapy;Iontophoresis;Contrast Bath;Splinting;Cryotherapy    Plan  weaning out of splint and return to prior activiites    OT Home Exercise Plan  see pt instruction     Consulted and Agree with Plan of Care  Patient       Patient will benefit from skilled therapeutic intervention in order to improve the following deficits and impairments:   Body Structure / Function / Physical Skills: ADL, IADL, Pain, Strength, ROM, UE functional use, Dexterity       Visit Diagnosis: Pain in left wrist - Plan: Ot plan of care cert/re-cert  Stiffness of left wrist, not elsewhere classified - Plan: Ot plan of care cert/re-cert  Muscle weakness (generalized) - Plan: Ot plan of care cert/re-cert    Problem List Patient Active Problem List   Diagnosis Date Noted  . Lymphedema of arm 12/21/2018  .  Bilateral hand numbness 12/21/2018  . GERD without esophagitis 12/21/2018  . Malignant neoplasm of upper-outer quadrant of right breast in female, estrogen receptor positive ([place]) 12/07/2017  . Allergic conjunctivitis of both eyes 09/16/2017  . Family history of breast cancer 10/13/2016  . Dysplasia of cervix, low grade (CIN 1) 10/13/2016  . Thrombocytosis ([place]) 03/16/2016  . 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 Hematuria 02/21/2015  . Vitamin D deficiency 02/21/2015  . Sterilization consult 02/21/2015  . Low back pain   . Morbid obesity ([place])   . Hyperlipidemia   . Hypertension   . IFG (impaired fasting glucose)   . Migraines     Darold Miley OTR/l,CLT 08/15/2019, 12:08 PM  [facility] [address] [place], [address] Phone: [phone]   Fax:  [phone]  Name: Ceyda Lashea Goda MRN: [id number] Date of Birth: Dec 18, 1985

## 2019-08-17 ENCOUNTER — Ambulatory Visit: Payer: Medicaid Other | Admitting: Physical Therapy

## 2019-08-17 ENCOUNTER — Other Ambulatory Visit: Payer: Self-pay

## 2019-08-17 DIAGNOSIS — M6281 Muscle weakness (generalized): Secondary | ICD-10-CM | POA: Diagnosis not present

## 2019-08-17 DIAGNOSIS — M25611 Stiffness of right shoulder, not elsewhere classified: Secondary | ICD-10-CM | POA: Diagnosis not present

## 2019-08-17 DIAGNOSIS — M25511 Pain in right shoulder: Secondary | ICD-10-CM | POA: Diagnosis not present

## 2019-08-17 DIAGNOSIS — R293 Abnormal posture: Secondary | ICD-10-CM

## 2019-08-17 DIAGNOSIS — G8929 Other chronic pain: Secondary | ICD-10-CM | POA: Diagnosis not present

## 2019-08-17 DIAGNOSIS — M25532 Pain in left wrist: Secondary | ICD-10-CM | POA: Diagnosis not present

## 2019-08-17 DIAGNOSIS — M25632 Stiffness of left wrist, not elsewhere classified: Secondary | ICD-10-CM | POA: Diagnosis not present

## 2019-08-17 NOTE — Therapy (Signed)
[facility] [address], [place], [address] Phone: [phone]   Fax:  [phone]  Physical Therapy Treatment  Patient Details  Name: Mckenzie Lopez MRN: [id number] Date of Birth: [birth year] Referring Provider (PT): [name] PA (Oncology)   Encounter Date: 08/17/2019  PT End of Session - 08/17/19 1055    Visit Number  1    Number of Visits  3    Date for PT Re-Evaluation  11/15/19    Authorization Time Period  08/17/19 - 11/15/19    Authorization - Visit Number  1    Authorization - Number of Visits  3    PT Start Time  1034    PT Stop Time  1115    PT Time Calculation (min)  41 min    Activity Tolerance  Patient tolerated treatment well    Behavior During Therapy  [facility] for tasks assessed/performed       Past Medical History:  Diagnosis Date  . BRCA negative 2019   BRCA with Dr. Bary Castilla, 7/20 Vistaseq panel neg with Elmo Putt Copland, PA-C  . CHF (congestive heart failure) ([place])    POSTPARTUM 2017/ RESOLVED  . Eczema   . 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 Hyperlipidemia   . Hypertension   . IFG (impaired fasting glucose)   . Intraductal carcinoma of right breast 11/2017   ER/PR+; Her2neu neg; mets to LN  . Low back pain   . Migraines   . Morbid obesity ([place])     Past Surgical History:  Procedure Laterality Date  . AXILLARY LYMPH NODE BIOPSY Right 12/01/2017   METASTATIC MAMMARY CARCINOMA  . AXILLARY LYMPH NODE BIOPSY Left 12/01/2017   NEGATIVE FOR MALIGNANCY  . BREAST BIOPSY Right 12/01/2017   11 and 12 o'clock, INVASIVE MAMMARY CARCINOMA, ER/PR positive HER2 negative  . MASTECTOMY Right 01/05/2018    There were no vitals filed for this visit.  Prior to Manual Subjective Assessment - 08/17/19 1038    Subjective  Patient reports no pain in the shoulder today. Has been working on her motion, and this is improving.    Pertinent History  Pt is a 34 year old female with hx of  breast cancer and complaints of R shldr pain. Pt is s/p mastectomy and lymphectomy on 01/05/2018, chemotherapy, and radiation that brought on the R shoulder pain and stiffness. Pt reports skin irritation of the anterior pec, axillary region, and posterior shoulder pain. Pt history of periphreal neuropathy with numbess in bilat hands and feet, c/o of tingling present in R axillary and chest region. Pt reports muscle spasm sensation in upper trap, a "pulling sensation" in axillary region down to the Latissimus Dorsi, and a throbbing sensation in anterior pectoral region. Current pain is 7/10, at worst 10/10, at best 5/10. Pt reports stiffness in R shoulder upon waking and increasing pain throughout the day with activity. Aggravating factors include reaching OH or behind the back and immediate onset and 5 mins for pain to decrease. Easing factors include resting. Pt wears compression sleeve from hand to shoulder most of the day and overnight to decrease swelling. Pt struggles with ADLs including cooking, cleaning, dressing, caring for small child (3y/o daughter) and has been unable to function around the house at previous level from radiation. Pt goal is to decrease pain and increase shoulder ROM and strength for ADLs.    Limitations  Lifting;House hold activities    How long can you stand comfortably?  10-15 minutes (also limited by pedal neuropathy)    How long can you walk comfortably?  about 15 minutes    Diagnostic tests  No imaging completed recently    Patient Stated Goals  To complete ADLs     flex 130d abd 97d IR L5 ER C8  After manual techniques  flex 150d abd 121d IR L1 ER C8  THEREX - Y standing on wall 3x 10 with min cuing to prevent trunk rotation with good carry over following - Bent over flys BW x10; with 1# DB 2x 10 with good carry over of proper technique following initial demo and cuing - Lat pulldown  25# 3x 10 with min cuing to prevent shoulder hiking with good carry  over  MAN THER -PROM for FLEX, ABD for 3x30 sec hold with inferior and posterior glide for increasing mobility -Grade I/II mobs inferior, lateral glides for pain reduction 3 bouts for 30 sec -Trigger point release in pec major muscle 2x15 sec holds with decreased pain noted and some decreased tension noted by PT STM to scar incision 53mnsabd/flex with scar massage                        PT Education - 08/17/19 1055    Education Details  therex form, manual techniques    Person(s) Educated  Patient    Methods  Explanation;Demonstration;Verbal cues    Comprehension  Verbalized understanding;Returned demonstration;Verbal cues required       PT Short Term Goals - 07/27/19 1036      PT SHORT TERM GOAL #1   Title  In 4 weeks, pt will decrease pain score by 2 point in order to make progress towards demonstrating clinically significant improvement in pain levels.    Baseline  05/25/19 7/10 pain; 06/09/2019 8/10; 07/24/19 5/10    Time  4    Period  Weeks    Status  Achieved    Target Date  06/22/19      PT SHORT TERM GOAL #2   Title  Pt will be independent with HEP in order to improve strength and balance in order to decrease fall risk and improve function at home and work.    Baseline  05/25/19 HEP given; compliant with exercise 07/24/19    Time  4    Period  Weeks    Status  Achieved        PT Long Term Goals - 07/27/19 1036      PT LONG TERM GOAL #1   Title  In 8 weeks, pt will decrease pain score by 4 points in order to improve functional ADL capability and care for young daughter.    Baseline  05/26/19 7/10 pain; 06/09/2019 8/10 pain; 07/24/19 5/10; 07/27/19 4/10    Time  8    Period  Weeks    Status  Achieved      PT LONG TERM GOAL #2   Title  In 8 weeks, pt will improve AROM of R shoulder WNL in order to perform personal grooming and household ADLs    Baseline  05/26/19 ER: R occiput IR R PSIS Flexion 90; Abduction 82 both painful; 06/09/2019 ER: R  occiput IR PSIS Flexion 100 ABD 95 painful for flexion/ABD; 03/152021 ER: C5 IR L3 ABD 90d Flex 115d    Time  8    Period  Weeks    Status  Deferred      PT LONG TERM GOAL #3   Title  In 8 weeks, pt will improve gross R shoulder and periscapular strength to 4+/5 MMT to perform heavy household chores and provide care to young daughter .    Baseline  05/26/19 Flexion/ABD/IR 4/5; lower trapezius 2-/5; scap retractors 3/5; I position 4/5; 06/09/2019 Flexion 4+/5, ABD 4/5, IR4/5 ER 4/5, lower trap 2+/5, middle trap 3/5, extension 4-/5 07/24/19 Flexion 4+/5, ABD 4+/5, IR 4+/5 ER 4+/5, lower trap 3/5, middle trap 4/5, extension 4+/5    Time  8    Period  Weeks    Status  Deferred      PT LONG TERM GOAL #4   Title  Patient will increase FOTO score to 60 to demonstrate predicted increase in functional mobility to complete ADLs    Baseline  05/26/19 30; 06/09/2019 34 07/27/19 48    Time  8    Period  Weeks    Status  On-going            Plan - 08/17/19 1105    Clinical Impression Statement  Patient returns to PT following insurance authorization. Patient has done well maintaining motion at home, but has difficulty espeically with overhead and IR behind back. Motion continues to improve following manual techniques. PT utilized therex for strenghtening within range obtained by manual techniques with good success. Patien tis able to comply with all cuing for proper technique/muscle activation. PT will continue progression as able.    Personal Factors and Comorbidities  Comorbidity 3+;Past/Current Experience;Time since onset of injury/illness/exacerbation;Profession    Examination-Activity Limitations  Caring for Others;Dressing;Carry;Hygiene/Grooming;Bathing    Examination-Participation Restrictions  Cleaning;Laundry;Community Activity    Stability/Clinical Decision Making  Evolving/Moderate complexity    Clinical Decision Making  Moderate    Rehab Potential  Good    PT Frequency  2x / week    PT  Duration  8 weeks    PT Treatment/Interventions  ADLs/Self Care Home Management;Aquatic Therapy;Functional mobility training;Therapeutic activities;Therapeutic exercise;Manual techniques;Compression bandaging;Neuromuscular re-education    PT Next Visit Plan  mobility, parascapular strength    PT Home Exercise Plan  doorway pec stretch, supine snow angels, table slides (flex/abd), supine long direction pec stretch, lateral neck flexion stretch for upper trap    Consulted and Agree with Plan of Care  Patient       Patient will benefit from skilled therapeutic intervention in order to improve the following deficits and impairments:  Decreased activity tolerance, Decreased range of motion, Decreased strength, Hypomobility, Impaired sensation, Impaired UE functional use, Pain, Decreased mobility, Decreased scar mobility, Increased edema, Postural dysfunction  Visit Diagnosis: Muscle weakness (generalized)  Abnormal posture  Stiffness of right shoulder, not elsewhere classified  Chronic right shoulder pain     Problem List Patient Active Problem List   Diagnosis Date Noted  . Lymphedema of arm 12/21/2018  . Bilateral hand numbness 12/21/2018  . GERD without esophagitis 12/21/2018  . Malignant neoplasm of upper-outer quadrant of right breast in female, estrogen receptor positive ([place]) 12/07/2017  . Allergic conjunctivitis of both eyes 09/16/2017  . Family history of breast cancer 10/13/2016  . Dysplasia of cervix, low grade (CIN 1) 10/13/2016  . Thrombocytosis ([place]) 03/16/2016  . 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 Hematuria 02/21/2015  . Vitamin D deficiency 02/21/2015  . Sterilization consult 02/21/2015  . Low back pain   . Morbid obesity ([place])   . Hyperlipidemia   . Hypertension   . IFG (impaired fasting glucose)   . Migraines    Shelton Silvas PT, DPT Shelton Silvas 08/17/2019, 12:03  PM  [facility] [address] [place], [address] Phone: [phone]   Fax:  [phone]  Name: Evgenia Nefertiti Mohamad MRN: [id number] Date of Birth: Jul 21, 1985

## 2019-08-18 IMAGING — MG MM BREAST LOCALIZATION CLIP
1 series · 1 of 1 positions shown · non-contrast
Comparison: Previous exam(s).

CLINICAL DATA: 32-year-old patient had a total for
ultrasound-guided biopsies today.

EXAM:
DIAGNOSTIC BILATERAL MAMMOGRAM POST ULTRASOUND BIOPSIES

[L ML]
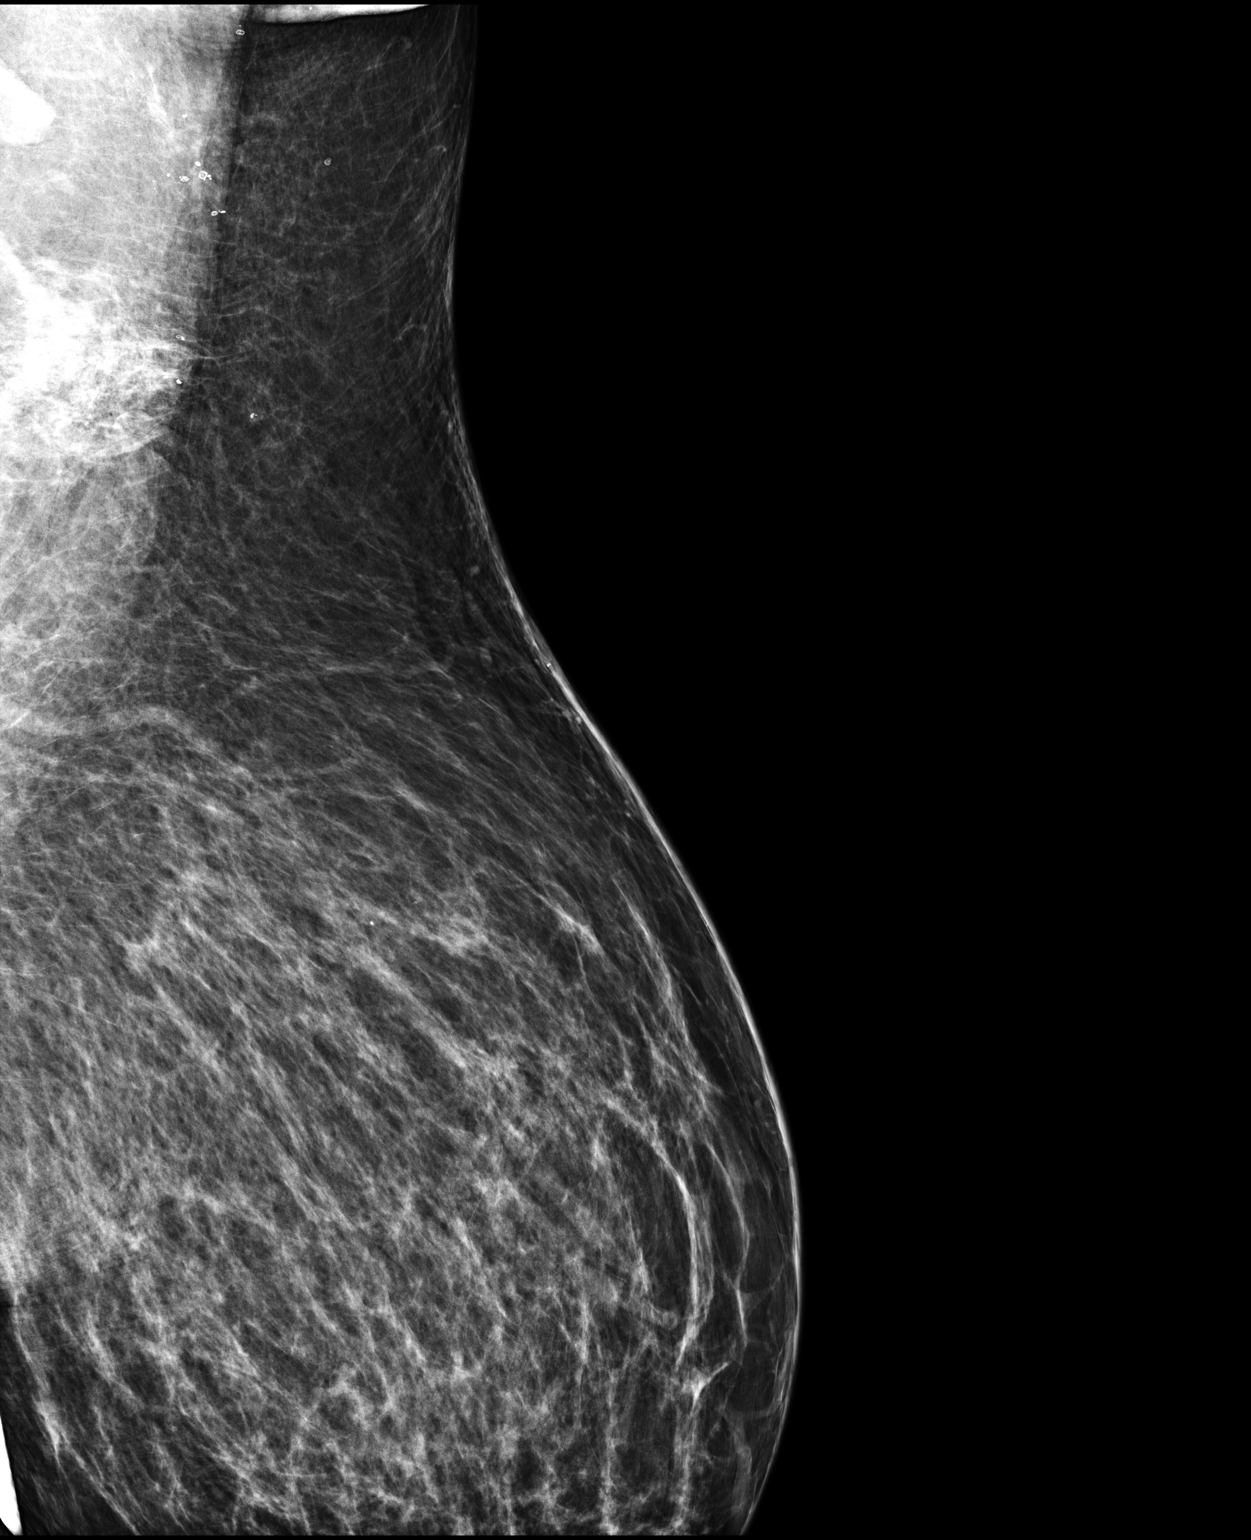

[1 of 1 positions shown; findings below may reference images not displayed]

FINDINGS: Mammographic images were obtained following ultrasound guided biopsy
of a right breast mass at 11 o'clock position, a right breast mass
at [DATE] to 12 o'clock position, a right axillary lymph node, and a
left axillary lymph node.

A ribbon shaped biopsy clip is satisfactorily positioned within a
mass at 11 o'clock position 4 cm from the nipple.

A coil shaped biopsy clip is satisfactorily positioned at a mass at
the [DATE] position 8 cm from the nipple.

A spiral HydroMARK clip projects over the right axilla. This is in
satisfactory position.

A HydroMARK spiral shaped biopsy clip projects over the left axilla
in the expected location of the biopsy.
IMPRESSION: Satisfactory position of biopsy clips in the right breast and
bilateral axillae.

Final Assessment: Post Procedure Mammograms for Marker Placement

## 2019-08-18 IMAGING — MG US BREAST BX W/ LOC DEV EA ADD LESION IMG BX SPEC US GUIDE*R*
1 series · 8 of 8 positions shown · IV contrast (agent unspecified)
Comparison: Previous exam(s).

ADDENDUM:
Pathology of the right breast biopsy, A. BREAST, RIGHT, 11 O'CLOCK 4
CM FROM NIPPLE; ULTRASOUND-GUIDED CORE BIOPSY: INVASIVE MAMMARY
CARCINOMA, NO SPECIAL TYPE. Size of invasive carcinoma: 9 mm in this
sample. Histologic grade of invasive carcinoma: Grade 2.

Pathology of the right breast biopsy, 12:00 o'clock position
revealed B. BREAST, RIGHT, 12 O'CLOCK, 8 CM FROM NIPPLE;
ULTRASOUND-GUIDED CORE BIOPSY: INVASIVE MAMMARY CARCINOMA, NO
SPECIAL TYPE. Size of invasive carcinoma: 5 mm in this sample.
Histologic grade of invasive carcinoma: Grade 2.
Pathology of the right axillary lymph node revealed C. LYMPH NODE,
RIGHT AXILLA; ULTRASOUND-GUIDED CORE BIOPSY: METASTATIC MAMMARY
CARCINOMA, 7 MM, WITHOUT IDENTIFIABLE RESIDUAL NODAL TISSUE.
Comment: The two breast tumors and the right axillary metastasis are
histologically similar. No ductal carcinoma in situ or
lymphovascular invasion is identified among these samples.
ER/PR/HER2: Immunohistochemistry will be performed on block A1, the
larger tumor, with reflex to FISH for HER2 2+. The results will be
reported in an addendum.
These findings were communicated to Lopsang Niz in Dr. [REDACTED] on 12/02/2017 at [DATE]. Read back procedure was performed.
All three biopsies found to be concordant by Dr. Lauber.
Recommendation: Surgical and oncology referrals. Also recommend
bilateral breast MRI without and with contrast for extent of
disease.
Results and recommendations were relayed to [place] by
Nero, Sohrab ([REDACTED]) on 12/02/17. She stated she
will contact the patient and make the referrals. She will make the
surgical referral and defer the breast MRI to the surgeon to order.
Addendum by Chalmers, Kiel on 12/02/17.
CLINICAL DATA: 32-year-old patient presents for 4 ultrasound-guided
core needle biopsies today. This report describes a biopsy of the
mass at [DATE] to 12 o'clock position 8 cm from the nipple in the
right breast.
EXAM:
ULTRASOUND GUIDED RIGHT BREAST CORE NEEDLE BIOPSY

[Series 1: MG view · 0.06mm/px · 8 of 23 slices shown]
[im 1/23]
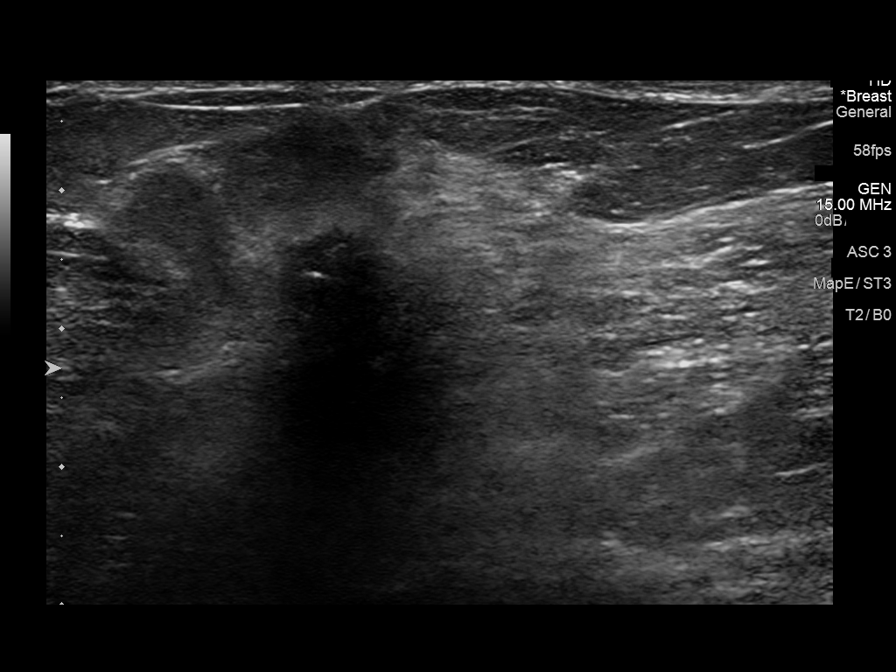
[im 4/23]
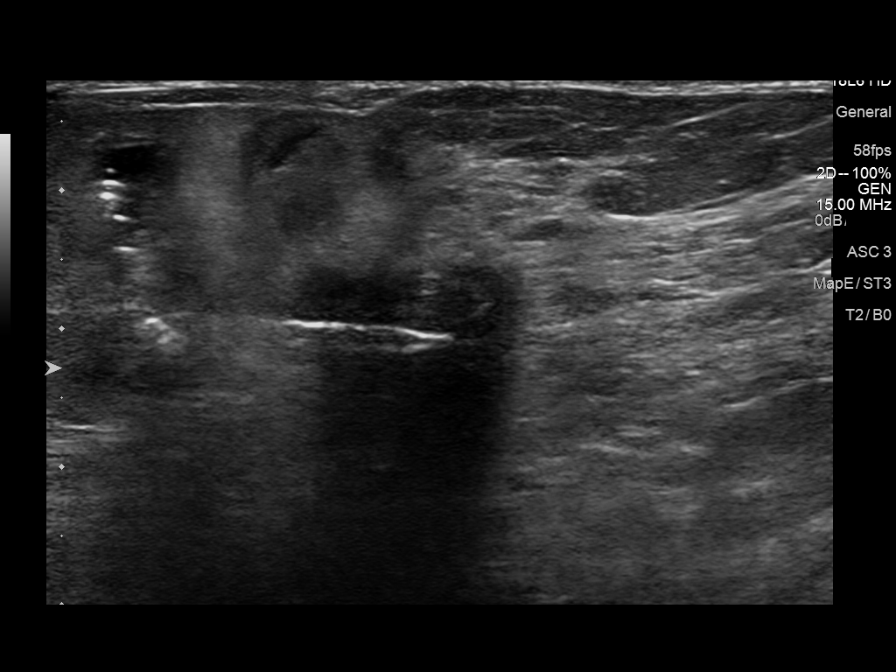
[im 7/23]
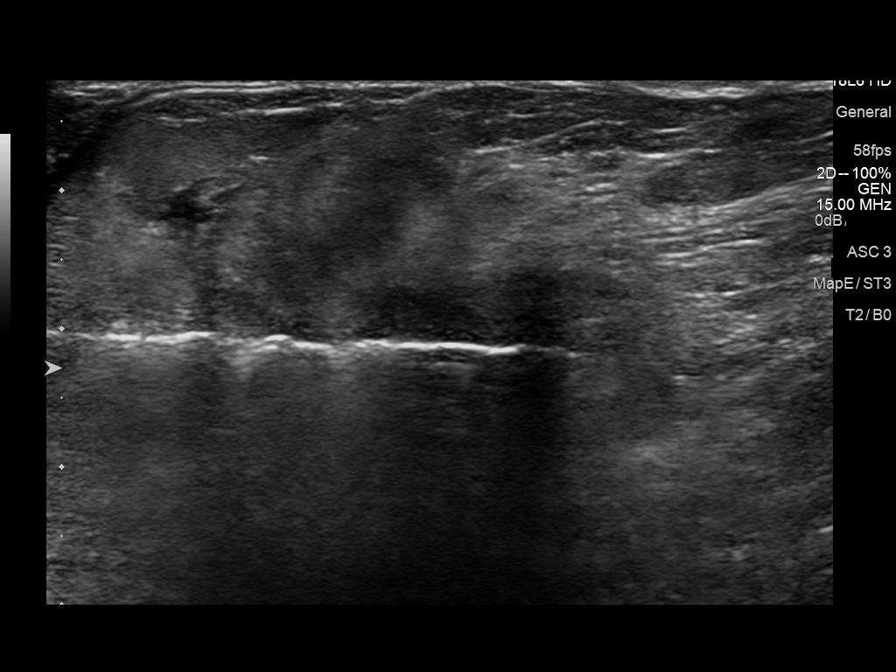
[im 10/23]
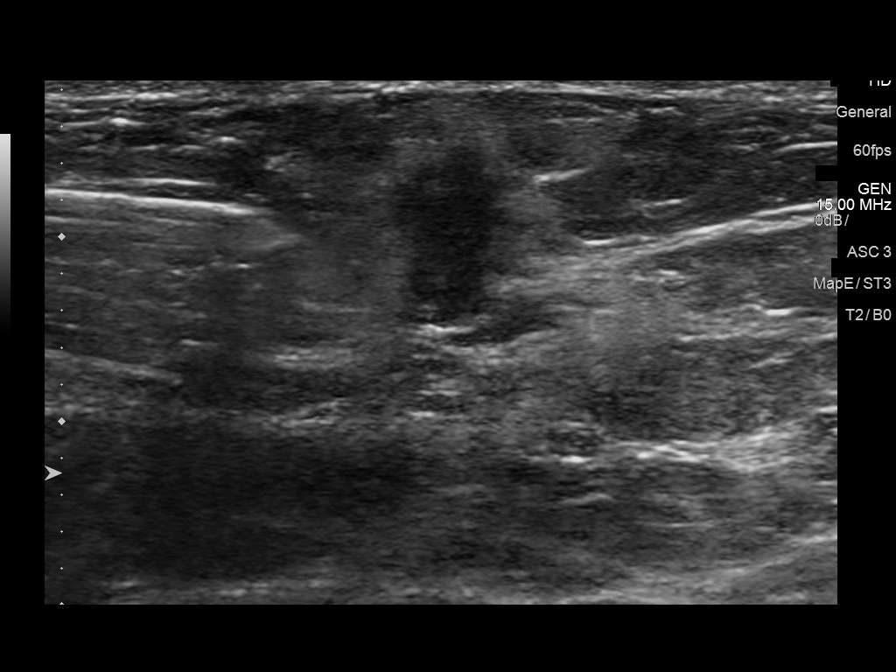
[im 13/23]
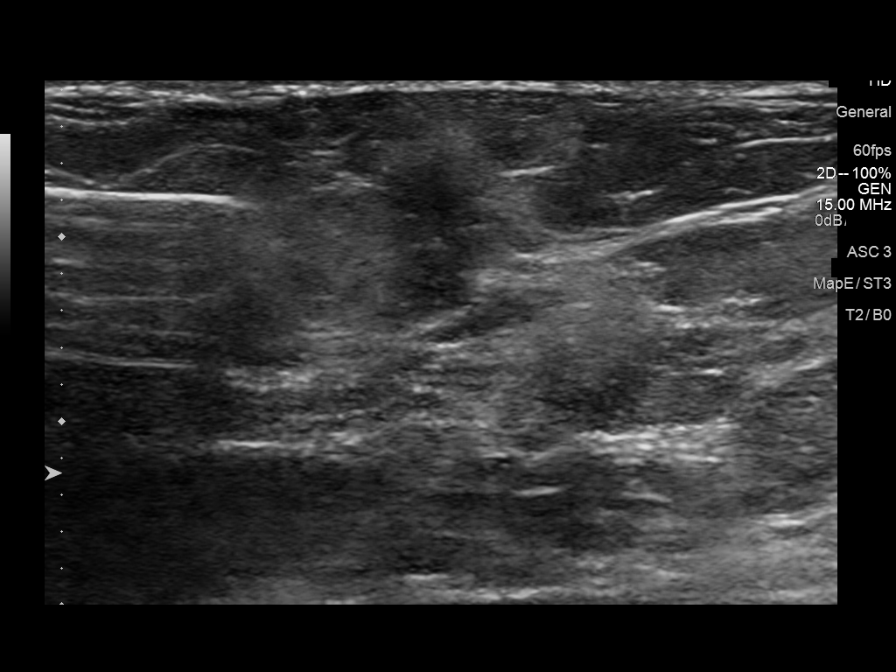
[im 16/23]
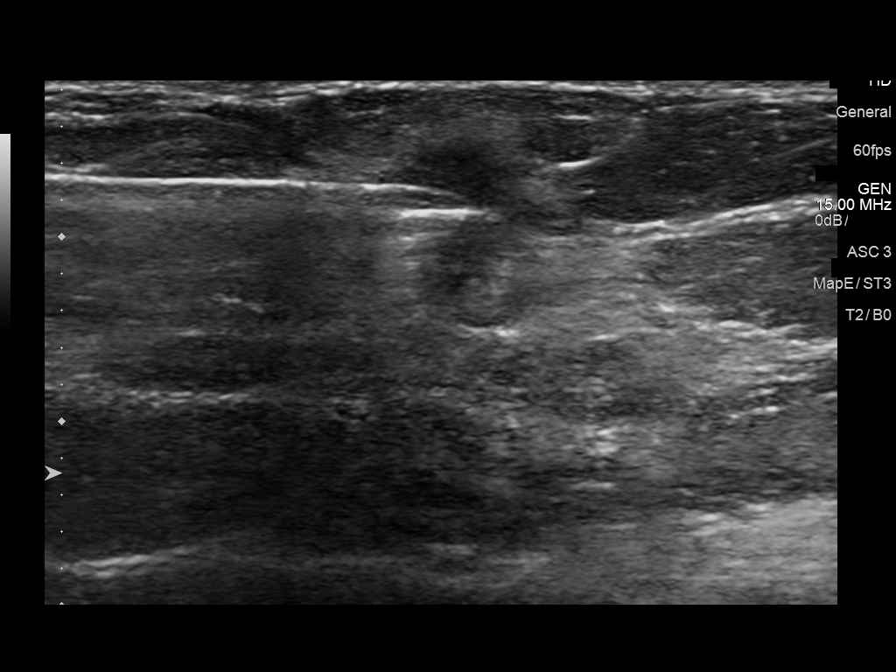
[im 19/23]
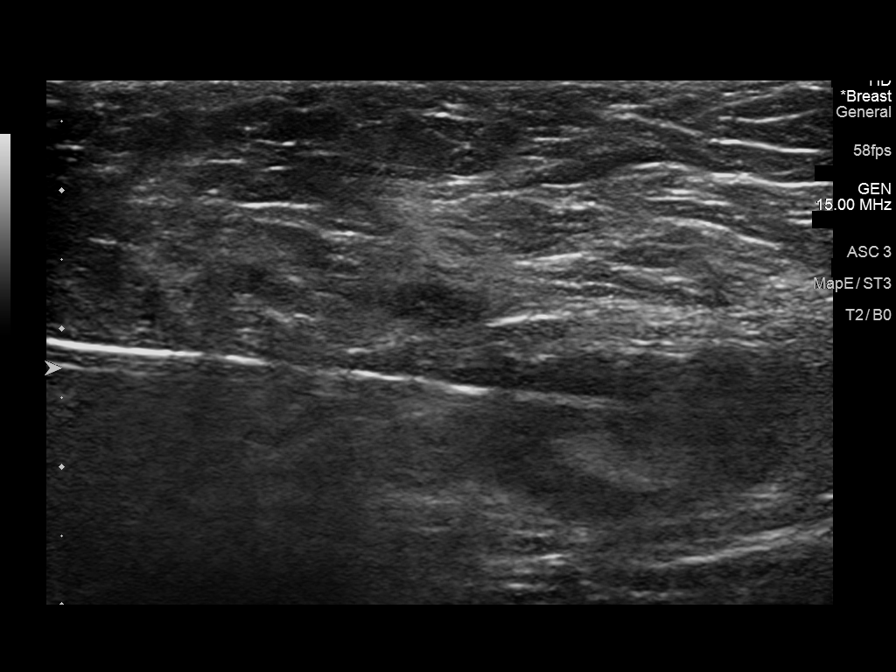
[im 23/23]
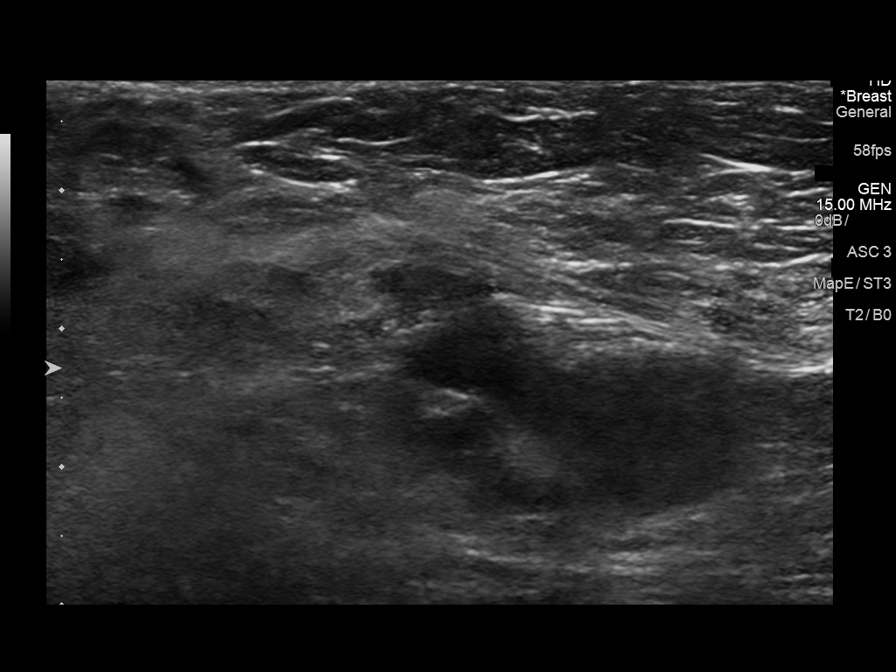

[8 of 8 positions shown; findings below may reference images not displayed]



Lesion quadrant: Upper outer quadrant

Using sterile technique and 1% Lidocaine with and without
epinephrine as local anesthetic, under direct ultrasound
visualization, a 14 gauge Chalmers, Kiel device was used to perform
biopsy of a suspicious mass at [DATE] to 12 o'clock position using a
lateral approach. At the conclusion of the procedure a coil tissue
marker clip was deployed into the biopsy cavity. Follow up 2 view
mammogram was performed and dictated separately.
IMPRESSION: Ultrasound guided biopsy of the right breast at [REDACTED] o'clock
position. No apparent complications.

## 2019-08-29 ENCOUNTER — Other Ambulatory Visit: Payer: Self-pay

## 2019-08-29 ENCOUNTER — Ambulatory Visit: Payer: Medicaid Other | Admitting: Occupational Therapy

## 2019-08-29 DIAGNOSIS — M25532 Pain in left wrist: Secondary | ICD-10-CM | POA: Diagnosis not present

## 2019-08-29 DIAGNOSIS — M25611 Stiffness of right shoulder, not elsewhere classified: Secondary | ICD-10-CM | POA: Diagnosis not present

## 2019-08-29 DIAGNOSIS — M25632 Stiffness of left wrist, not elsewhere classified: Secondary | ICD-10-CM

## 2019-08-29 DIAGNOSIS — R293 Abnormal posture: Secondary | ICD-10-CM | POA: Diagnosis not present

## 2019-08-29 DIAGNOSIS — G8929 Other chronic pain: Secondary | ICD-10-CM | POA: Diagnosis not present

## 2019-08-29 DIAGNOSIS — M6281 Muscle weakness (generalized): Secondary | ICD-10-CM

## 2019-08-29 DIAGNOSIS — M25511 Pain in right shoulder: Secondary | ICD-10-CM | POA: Diagnosis not present

## 2019-08-29 NOTE — Patient Instructions (Signed)
Use splints neoprene and hard case as needed

## 2019-08-29 NOTE — Therapy (Signed)
[facility] [address], [place], [address] Phone: [phone]   Fax:  [phone]  Occupational Therapy Treatment/discharge  Patient Details  Name: Mckenzie Lopez MRN: [id number] Date of Birth: 1986-03-26 Referring Provider (OT): Delsa Grana PA   Encounter Date: 08/29/2019  OT End of Session - 08/29/19 1207    Visit Number  11    Number of Visits  11    Date for OT Re-Evaluation  08/29/19    OT Start Time  1130    OT Stop Time  1204    OT Time Calculation (min)  34 min    Activity Tolerance  Patient tolerated treatment well    Behavior During Therapy  [facility] for tasks assessed/performed       Past Medical History:  Diagnosis Date  . BRCA negative 2019   BRCA with Dr. Bary Castilla, 7/20 Vistaseq panel neg with Elmo Putt Copland, PA-C  . CHF (congestive heart failure) ([place])    POSTPARTUM 2017/ RESOLVED  . Eczema   . 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 Hyperlipidemia   . Hypertension   . IFG (impaired fasting glucose)   . Intraductal carcinoma of right breast 11/2017   ER/PR+; Her2neu neg; mets to LN  . Low back pain   . Migraines   . Morbid obesity ([place])     Past Surgical History:  Procedure Laterality Date  . AXILLARY LYMPH NODE BIOPSY Right 12/01/2017   METASTATIC MAMMARY CARCINOMA  . AXILLARY LYMPH NODE BIOPSY Left 12/01/2017   NEGATIVE FOR MALIGNANCY  . BREAST BIOPSY Right 12/01/2017   11 and 12 o'clock, INVASIVE MAMMARY CARCINOMA, ER/PR positive HER2 negative  . MASTECTOMY Right 01/05/2018    There were no vitals filed for this visit.  Subjective Assessment - 08/29/19 1206    Subjective   Doing very well - did not had any pain -and only wore my soft splint less than 20% of the time- if I am doing something I think will bother or be a lot on my thumb    Pertinent History  R modified radical mastectomy with lymph node dissection; negative margins; 5 of 21 lymph nodes positive for  metastatic carcinoma;01/31/18 Chemotherapy Initiated: Dose-dense doxorubicin-cyclophosphamide (ddAC) q2 weeks x 4 cycles followed by weekly paclitaxel (weekly-T).had radiation - then developed neuropathy form chemo - harder time doing thinkgs with her hand - tendinitis started in January 2021 in L wrist - pulling up her compression sleeve for daytime and night time , pump - doing her daughers hair    Patient Stated Goals  I want the pain better in my L wrist so I can use it to hold , pick up , squeez objects , do my daughters hair, put on my clothes and compression garments    Currently in Pain?  No/denies         [facility] OT Assessment - 08/29/19 0001      AROM   Left Wrist Extension  80 Degrees    Left Wrist Flexion  80 Degrees    Left Wrist Radial Deviation  20 Degrees    Left Wrist Ulnar Deviation  30 Degrees      Strength   Right Hand Grip (lbs)  45    Right Hand Lateral Pinch  23 lbs    Right Hand 3 Point Pinch  18 lbs    Left Hand Grip (lbs)  48    Left Hand Lateral Pinch  21 lbs  Left Hand 3 Point Pinch  16 lbs      LYMPHEDEMA/ONCOLOGY QUESTIONNAIRE - 08/29/19 1140      Right Upper Extremity Lymphedema   15 cm Proximal to Olecranon Process  49 cm    10 cm Proximal to Olecranon Process  47 cm    Olecranon Process  35 cm    15 cm Proximal to Ulnar Styloid Process  32.8 cm    10 cm Proximal to Ulnar Styloid Process  28.2 cm    Just Proximal to Ulnar Styloid Process  22.8 cm    Across Hand at [employer]  24 cm    At [place] of 2nd Digit  7.8 cm    At [facility] of Thumb  8 cm        AROM and grip/prehension strength assess - great progress and no pain from [facility]  M/M strength in wrist in all planes 5/5  able to carry and pick up 10 lbs pain free without splint  Soft neoprene wrist and thumb wrap -she only wore the last 2 wks less than 20% of the time incase some activities will bother her - but did not sleep with any  Did not had pain and did not had to use thumb spica hard  part   Do have hard shell to slide on if any increase pain or symptoms occur future   Cont to not over grip objects because of chemo neuropathy  And cont to modify avoid tight and sustained grip or prehension   Pt to contact me in Oct about replacing her compression sleeve and glove for R UE lymphedema                  OT Education - 08/29/19 1207    Education Details  discharge instructions    Person(s) Educated  Patient    Methods  Explanation;Demonstration;Handout    Comprehension  Verbalized understanding;Returned demonstration       OT Short Term Goals - 08/29/19 1209      OT SHORT TERM GOAL #1   Title  Pain on PRWHE improve with more than 20 points    Baseline  at eval PRWHE for pain 46/50 and 08/15/2019 was 4/50, and now 0/50    Status  Achieved      OT SHORT TERM GOAL #2   Title  Pt to be independent in use of splint and HEP to decrease pain in L wrist    Baseline  no pain    Status  Achieved        OT Long Term Goals - 08/29/19 1210      OT LONG TERM GOAL #1   Title  Pain in L wrist increase for pt to wean out of custom thumb spica    Baseline  neoprene thumb and wrist wrap less than 20% and wearing not of pain but incase something will bother it    Status  Achieved      OT LONG TERM GOAL #2   Title  Pt L wrist AROM in all planes increase for pt to have not increase symptoms more than 2/10    Status  Achieved      OT LONG TERM GOAL #4   Title  Pt to where soft neoprene splint only with high risk actvities on L domimant wrist  and stay symptom/ pain free    Baseline  only wearing neoprene wrist and thumb wrap less than 20% and incase for pain - not  for pain    Status  Achieved            Plan - 08/29/19 1207    Clinical Impression Statement  Pt made great progress in [place] symptoms- pain free now for 3-4 wks and only using her soft neoprene wrist and thumb wrap less than 20% of the time - and mostly incase she things something will  bother her- grip and prehension about WNL - pt to cont to modify activities to decrease symptoms to occur again because of neuropathy fomr chemo she over grip or grip objects tight - pt discharge at this time       Patient will benefit from skilled therapeutic intervention in order to improve the following deficits and impairments:           Visit Diagnosis: Muscle weakness (generalized)  Pain in left wrist  Stiffness of left wrist, not elsewhere classified    Problem List Patient Active Problem List   Diagnosis Date Noted  . Lymphedema of arm 12/21/2018  . Bilateral hand numbness 12/21/2018  . GERD without esophagitis 12/21/2018  . Malignant neoplasm of upper-outer quadrant of right breast in female, estrogen receptor positive ([place]) 12/07/2017  . Allergic conjunctivitis of both eyes 09/16/2017  . Family history of breast cancer 10/13/2016  . Dysplasia of cervix, low grade (CIN 1) 10/13/2016  . Thrombocytosis ([place]) 03/16/2016  . 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 Hematuria 02/21/2015  . Vitamin D deficiency 02/21/2015  . Sterilization consult 02/21/2015  . Low back pain   . Morbid obesity ([place])   . Hyperlipidemia   . Hypertension   . IFG (impaired fasting glucose)   . Migraines     Loletta Harper OTR/l,CLT 08/29/2019, 12:12 PM  [facility] [address], [place], [address] Phone: [phone]   Fax:  [phone]  Name: Mckenzie Lopez MRN: [id number] Date of Birth: 10/17/1985

## 2019-09-05 ENCOUNTER — Other Ambulatory Visit: Payer: Self-pay

## 2019-09-05 ENCOUNTER — Encounter: Payer: Self-pay | Admitting: Family Medicine

## 2019-09-05 MED ORDER — AMLODIPINE BESYLATE 5 MG PO TABS: 5.0000 mg | ORAL_TABLET | Freq: Every day | ORAL | 0 refills | Status: DC

## 2019-09-08 ENCOUNTER — Other Ambulatory Visit: Payer: Self-pay | Admitting: Family Medicine

## 2019-09-08 NOTE — Telephone Encounter (Signed)
Requested Prescriptions  Pending Prescriptions Disp Refills  . labetalol (NORMODYNE) 200 MG tablet [Pharmacy Med Name: LABETALOL 200MG  TABLETS] 30 tablet 0    Sig: TAKE 1 TABLET BY MOUTH THREE TIMES DAILY     Cardiovascular:  Beta Blockers Failed - 09/08/2019 10:05 AM      Failed - Valid encounter within last 6 months    Recent Outpatient Visits          8 months ago Malignant neoplasm of upper-outer quadrant of right breast in female, estrogen receptor positive [facility])   [place], [place], FNP   11 months ago Essential hypertension   [place], [place] Born, NP   1 year ago Weight gain   [place], Satira Anis, MD   1 year ago Essential hypertension   [place], Satira Anis, MD   1 year ago Allergic conjunctivitis of both eyes   [place], Satira Anis, MD      Future Appointments            In 1 week Delsa Grana, PA-C [facility], [place] BP in normal range    BP Readings from Last 1 Encounters:  12/21/18 112/68         Passed - Last Heart Rate in normal range    Pulse Readings from Last 1 Encounters:  12/21/18 89

## 2019-09-19 ENCOUNTER — Other Ambulatory Visit: Payer: Self-pay

## 2019-09-19 ENCOUNTER — Ambulatory Visit: Payer: Medicaid Other | Admitting: Family Medicine

## 2019-09-19 ENCOUNTER — Encounter: Payer: Self-pay | Admitting: Family Medicine

## 2019-09-19 VITALS — BP 134/82 | HR 97 | Temp 98.1°F | Resp 14 | Ht 65.0 in | Wt 300.8 lb

## 2019-09-19 DIAGNOSIS — Z9189 Other specified personal risk factors, not elsewhere classified: Secondary | ICD-10-CM

## 2019-09-19 DIAGNOSIS — E782 Mixed hyperlipidemia: Secondary | ICD-10-CM | POA: Diagnosis not present

## 2019-09-19 DIAGNOSIS — Z5181 Encounter for therapeutic drug level monitoring: Secondary | ICD-10-CM

## 2019-09-19 DIAGNOSIS — G620 Drug-induced polyneuropathy: Secondary | ICD-10-CM | POA: Diagnosis not present

## 2019-09-19 DIAGNOSIS — Z6841 Body Mass Index (BMI) 40.0 and over, adult: Secondary | ICD-10-CM

## 2019-09-19 DIAGNOSIS — I10 Essential (primary) hypertension: Secondary | ICD-10-CM | POA: Diagnosis not present

## 2019-09-19 DIAGNOSIS — K219 Gastro-esophageal reflux disease without esophagitis: Secondary | ICD-10-CM | POA: Diagnosis not present

## 2019-09-19 DIAGNOSIS — I890 Lymphedema, not elsewhere classified: Secondary | ICD-10-CM

## 2019-09-19 DIAGNOSIS — E559 Vitamin D deficiency, unspecified: Secondary | ICD-10-CM

## 2019-09-19 DIAGNOSIS — R7301 Impaired fasting glucose: Secondary | ICD-10-CM

## 2019-09-19 DIAGNOSIS — C50411 Malignant neoplasm of upper-outer quadrant of right female breast: Secondary | ICD-10-CM

## 2019-09-19 DIAGNOSIS — E8881 Metabolic syndrome: Secondary | ICD-10-CM | POA: Diagnosis not present

## 2019-09-19 DIAGNOSIS — E88819 Insulin resistance, unspecified: Secondary | ICD-10-CM | POA: Insufficient documentation

## 2019-09-19 DIAGNOSIS — Z8679 Personal history of other diseases of the circulatory system: Secondary | ICD-10-CM | POA: Diagnosis not present

## 2019-09-19 DIAGNOSIS — Z170 Estrogen receptor positive status [ER+]: Secondary | ICD-10-CM

## 2019-09-19 DIAGNOSIS — I119 Hypertensive heart disease without heart failure: Secondary | ICD-10-CM | POA: Diagnosis not present

## 2019-09-19 DIAGNOSIS — D473 Essential (hemorrhagic) thrombocythemia: Secondary | ICD-10-CM

## 2019-09-19 DIAGNOSIS — D75839 Thrombocytosis, unspecified: Secondary | ICD-10-CM

## 2019-09-19 MED ORDER — LABETALOL HCL 200 MG PO TABS: ORAL_TABLET | ORAL | 3 refills | Status: DC

## 2019-09-19 MED ORDER — VITAMIN D (ERGOCALCIFEROL) 1.25 MG (50000 UNIT) PO CAPS: 50000.0000 [IU] | ORAL_CAPSULE | ORAL | 0 refills | Status: DC

## 2019-09-19 MED ORDER — AMLODIPINE BESYLATE 5 MG PO TABS: 5.0000 mg | ORAL_TABLET | Freq: Every day | ORAL | 3 refills | Status: DC

## 2019-09-19 MED ORDER — HYDRALAZINE HCL 50 MG PO TABS: 50.0000 mg | ORAL_TABLET | Freq: Three times a day (TID) | ORAL | 1 refills | Status: DC

## 2019-09-19 NOTE — Progress Notes (Signed)
Name: Mckenzie Lopez   MRN: [id number]    DOB: 08/09/1985   Date:09/19/2019       Progress Note  Chief Complaint  Patient presents with  . Follow-up  . Hypertension     Subjective:   Mckenzie Lopez is a 34 y.o. female, presents to clinic for routine follow up on the conditions listed above.  Pt has not been seen in some time, presents for med refill and f/up on HTN, her PCP is out of the office on maternity leave.  Last OV was 12/2018, she requested med refills and was told she needs to come in - last labs were done over a year ago 06/2018 (15 months ago)  Hypertension:  Currently managed on norvasc 5 mg, hydralazine 50 mg TID and labetalol 200 TID - put on labetalol in 2017 after pregnancy- she has severe emergent sx - hypertensive emergency she says a few days after having her child, she had "fluid in chest" and was admitted to the hospital. She was put on those meds and has been on them ever since.  Her last PCP left this clinic last year, pt only briefly saw Raquel Sarna NP on a virtual visit, and majority of her other care has been through specialist and other health systems nearby. Pt reports good med compliance and denies any SE.  No lightheadedness, hypotension, syncope. Blood pressure today is suboptimal but near goal of <130/80 BP Readings from Last 3 Encounters:  09/19/19 134/82  12/21/18 112/68  11/08/18 (!) 146/98  Pt denies CP, SOB, exertional sx, LE edema, palpitation, Ha's, visual disturbances She had hospitalization in 2017 for HTN urgency and postpartum cardiomyopathy?  She has other cardiac testing at [facility] prior to surgery for breast CA in 2019 She has tried higher dose amlodipine in the past but it caused severe lower extremity edema she also has an allergy to chlorthalidone.    ECHO 11/14/2015 reviewed today :  LV EF: 65%  -------------------------------------------------------------------  Indications:   CHF-acute diastolic 626.31.    -------------------------------------------------------------------  History:  PMH: HLD, IFG, migraines. Risk factors: Hypertension.  -------------------------------------------------------------------  Study Conclusions  - Left ventricle: The cavity size was normal. Systolic function was  normal. The estimated ejection fraction was 65%. Wall motion was  normal; there were no regional wall motion abnormalities. Doppler  parameters are consistent with abnormal left ventricular  relaxation (grade 1 diastolic dysfunction).  - Aortic valve: Valve area (Vmax): 2.22 cm^2.  - Left atrium: The atrium was mildly dilated.  - Atrial septum: No defect or patent foramen ovale was identified.  Impressions:  - NOrmal EF ,mild diastolic dysfunction, trace MR.    Last echo I can see in care everywhere is 12/31/2017 in December 2019- pt states cardiac testing was redone prior to chemotherapy, surgeries and procedures but she has not seen a cardiologist outpatient 04/25/2018 ECHO:   Per [facility] everywhere    HISTORY:  Chemotherapy     REASON: Assess LV function INDICATION: C50.811 - Malignant neoplasm of overlapping sites of right female             breast (CMS-HCC). T45.1X5A - Adverse effect of antineoplastic             and immunosuppressive drugs, initial encounter. Z17.0 - Estrogen             receptor positive status (ER+). I42.7 - Cardiomyopathy due to             drug and external agent (CMS-HCC).  INTERPRETATION ---------------------------------------------------------------   NORMAL LEFT VENTRICULAR FUNCTION   NORMAL LA PRESSURES WITH NORMAL DIASTOLIC FUNCTION   NORMAL RIGHT VENTRICULAR SYSTOLIC FUNCTION   VALVULAR REGURGITATION: TRIVIAL PR, TRIVIAL TR   NO VALVULAR STENOSIS   3D acquisition and reconstructions were performed as part of this   examination to more accurately quantify the effects of Pre/Post Chemo,   Radiation or other therapy.  Compared with prior Echo study on  12/31/2017: NO SIGNIFICANT CHANGE LVEF 55%  12/31/2017 ECHO:   Per [facility]  I51.7 - Cardiomegaly - NORMAL LEFT VENTRICULAR FUNCTION WITH MILD LVH NORMAL LA PRESSURES WITH NORMAL DIASTOLIC FUNCTION NORMAL RIGHT VENTRICULAR SYSTOLIC FUNCTION VALVULAR REGURGITATION: TRIVIAL PR, TRIVIAL TR NO VALVULAR STENOSIS NO PRIOR STUDY FOR COMPARISON Compared with prior Echo study on 09/25/2015: IMPROVED LV FUNCTION. NORMAL IVC DIAMETER WITH NORMAL COLLAPSIBILITY. LVEF 55%  She is wanting to change meds or get different meds that are not TID, she does not believe she has ever seen cardiology outpt following her hospitalization in 2017  July 2019 breast CA right side radical mastectomy - managed at [facility] with right upper arm lymphadenopathy and with peripheral neuropathy secondary to chemo tx  She is also S/P hysterectomy (total) 11/2018   Reviewed last oncology appt:  06/27/2019: Malignant neoplasm of overlapping sites of right breast in female, estrogen receptor positive (CMS-HCC) (Primary Dx);  At risk for loss of bone density;  Aromatase inhibitor use;  Drug-induced polyneuropathy (CMS-HCC);  Vitamin D insufficiency;  Lymphedema of arm She is current with her office visits monitoring and scans she is on Arimidex -for 10 years started March 2021   GERD: well controlled sx, takes prn, no change Takes famotidine at night time PRN, takes it as needed maybe every other day at the maximum. Doing well on this regimen, no difficulty swallowing, blood in stool, or abdominal pain.   Hyperlipidemia:  Not presently on any medications for cholesterol. History of metabolic syndrome, strong family history of obesity and diabetes no premature cardiac disease in family history the patient is at slightly higher risk due to being rendered surgically postmenopausal in her early 85s and use of rotates inhibitors for the next 10 years Lab Results  Component Value Date   CHOL 185  06/22/2018   HDL 42 (L) 06/22/2018   LDLCALC 121 (H) 06/22/2018   TRIG 113 06/22/2018   CHOLHDL 4.4 06/22/2018    Morbid obesity/metabolic syndrome with history of impaired fasting glucose/prediabetes Wt Readings from Last 5 Encounters:  09/19/19 (!) 300 lb 12.8 oz (136.4 kg)  12/21/18 288 lb (130.6 kg)  11/08/18 292 lb 6.4 oz (132.6 kg)  09/20/18 288 lb (130.6 kg)  06/22/18 (!) 305 lb (138.3 kg)   BMI Readings from Last 5 Encounters:  09/19/19 50.06 kg/m  12/21/18 47.93 kg/m  11/08/18 48.66 kg/m  09/20/18 47.93 kg/m  06/22/18 50.75 kg/m  Raquel Sarna in the past was doing victoza and could not get the saxenda due to cost  Past HPI 12/2018: Had tried saxenda for 4 months with some improvement. States she was getting a lot of steroids when doing chemo and gained weight.She is trying to walk in her neighborhood 3 days a week for at least 30 minutes. Discussed dietary changes in great detail. Last A1C was normal.  Denies polyuria, polyphagia, or polyuria.  She has taken [place] and wold like to restart, will check with oncology first and let me know. Oral medications caused palpitations and chest tightness in the past.  No family history  of thyroid cancer or personal hx thyroid cancer or pancreatitis.  Vit D deficiency taking Rx supplement weekly - presdcribed by oncology at [place]   At risk for bone density loss due to AI - Bone density scheduled with [facility], on high dose Vit D, further plan and tx reviewed from 07/2019: 7. Bone Health  - continue weekly Ergocalciferol  - baseline DXA has been ordered and will be scheduled - Today, we discussed the option of Zometa infusions once every 6 months for 2-3 years. We discussed the benefits including increasing her bone mineral density, decreased risk of cancer recurrence, and decreased risk of bone metastases. We talked about potential side effects of treatment including hypersensitivity reactions, flu-like reaction (especially after the first  dose) and small risk of MRJON. Additional information was provided to patient in her AVS. Dental clearance letter was provided to the patient. She states that she would like to proceed with the infusions, and will have her dentist fill out the form.     Patient Active Problem List   Diagnosis Date Noted  . Drug-induced polyneuropathy ([place]) 09/20/2019  . At risk for loss of bone density 09/20/2019  . LVH (left ventricular hypertrophy) due to hypertensive disease, without heart failure 09/19/2019  . H/O cardiomegaly 09/19/2019  . Metabolic syndrome 62/70/3500  . Lymphedema of right arm 12/21/2018  . GERD without esophagitis 12/21/2018  . Malignant neoplasm of upper-outer quadrant of right breast in female, estrogen receptor positive ([place]) 12/07/2017  . Family history of breast cancer 10/13/2016  . Dysplasia of cervix, low grade (CIN 1) 10/13/2016  . Thrombocytosis ([place]) 03/16/2016  . Hemoglobin C trait ([place]) 11/29/2015  . Vitamin D deficiency 02/21/2015  . Morbid obesity with BMI of 50.0-59.9, adult ([place])   . Hyperlipidemia   . Hypertension   . IFG (impaired fasting glucose)   . Migraines     Past Surgical History:  Procedure Laterality Date  . ABDOMINAL HYSTERECTOMY  11/28/2018   Full  . AXILLARY LYMPH NODE BIOPSY Right 12/01/2017   METASTATIC MAMMARY CARCINOMA  . AXILLARY LYMPH NODE BIOPSY Left 12/01/2017   NEGATIVE FOR MALIGNANCY  . BREAST BIOPSY Right 12/01/2017   11 and 12 o'clock, INVASIVE MAMMARY CARCINOMA, ER/PR positive HER2 negative  . MASTECTOMY Right 01/05/2018    Family History  Problem Relation Age of Onset  . Diabetes Mother   . Hypertension Mother   . Thyroid disease Mother   . Breast cancer Maternal Grandmother        ?age  . Diabetes Maternal Grandfather   . Hypertension Maternal Grandfather   . Seizures Maternal Grandfather   . Breast cancer Maternal Aunt        early 51's  . Diabetes Sister   . Diabetes Paternal Grandmother   . Heart disease Neg  Hx   . Stroke Neg Hx   . COPD Neg Hx   . Ovarian cancer Neg Hx     Social History   Tobacco Use  . Smoking status: Never Smoker  . Smokeless tobacco: Never Used  Substance Use Topics  . Alcohol use: No  . Drug use: No      Current Outpatient Medications:  .  amLODipine (NORVASC) 5 MG tablet, Take 1 tablet (5 mg total) by mouth daily., Disp: 30 tablet, Rfl: 0 .  calcium carbonate (TUMS - DOSED IN MG ELEMENTAL CALCIUM) 500 MG chewable tablet, Chew 1 tablet by mouth daily., Disp: , Rfl:  .  DULoxetine (CYMBALTA) 30 MG capsule, Take 30 mg  by mouth daily., Disp: , Rfl:  .  fluticasone (FLONASE) 50 MCG/ACT nasal spray, SHAKE LIQUID AND USE 2 SPRAYS IN EACH NOSTRIL DAILY AS NEEDED FOR ALLERGIES, Disp: 48 g, Rfl: 0 .  gabapentin (NEURONTIN) 100 MG capsule, , Disp: , Rfl:  .  gabapentin (NEURONTIN) 300 MG capsule, Take 300 mg by mouth 3 (three) times daily., Disp: , Rfl:  .  hydrALAZINE (APRESOLINE) 50 MG tablet, Take 1 tablet (50 mg total) by mouth 3 (three) times daily., Disp: 270 tablet, Rfl: 2 .  labetalol (NORMODYNE) 200 MG tablet, TAKE 1 TABLET BY MOUTH THREE TIMES DAILY, Disp: 60 tablet, Rfl: 0 .  loratadine (CLARITIN) 10 MG tablet, Take 10 mg by mouth daily., Disp: , Rfl:  .  Magnesium 250 MG TABS, Take by mouth., Disp: , Rfl:  .  Olopatadine HCl 0.2 % SOLN, One drop in each eye daily, Disp: 2.5 mL, Rfl: 11 .  vitamin B-12 (CYANOCOBALAMIN) 100 MCG tablet, Take 100 mcg by mouth daily., Disp: , Rfl:  .  Vitamin D, Ergocalciferol, (DRISDOL) 1.25 MG (50000 UT) CAPS capsule, Take 50,000 Units by mouth every 7 (seven) days. , Disp: , Rfl:  .  Blood Pressure Monitoring (BLOOD PRESSURE MONITOR/L CUFF) MISC, Large cuff; check BP; fluctuating blood pressures; HTN; LON 99 months, Disp: 1 each, Rfl: 0 .  Cholecalciferol (VITAMIN D) 2000 UNITS CAPS, Take 2,000 Units by mouth daily., Disp: , Rfl:  .  Insulin Pen Needle (NOVOFINE) 32G X 6 MM MISC, 1 each by Does not apply route daily., Disp: 100  each, Rfl: 1  Allergies  Allergen Reactions  . Chlorthalidone Other (See Comments)    Severe hypokalemia    Chart Review Today: I personally reviewed active problem list, medication list, allergies, family history, social history, health maintenance, notes from last encounter, lab results, imaging with the patient/caregiver today.   Review of Systems  10 Systems reviewed and are negative for acute change except as noted in the HPI.  Objective:    Vitals:   09/19/19 1121  BP: 134/82  Pulse: 97  Resp: 14  Temp: 98.1 F (36.7 C)  SpO2: 98%  Weight: (!) 300 lb 12.8 oz (136.4 kg)  Height: '5\' 5"'$  (1.651 m)    Body mass index is 50.06 kg/m.  Physical Exam Vitals and nursing note reviewed.  Constitutional:      General: She is not in acute distress.    Appearance: Normal appearance. She is well-developed. She is morbidly obese. She is not ill-appearing, toxic-appearing or diaphoretic.     Interventions: Face mask in place.  HENT:     Head: Normocephalic and atraumatic.     Right Ear: External ear normal.     Left Ear: External ear normal.  Eyes:     General: Lids are normal. No scleral icterus.       Right eye: No discharge.        Left eye: No discharge.     Conjunctiva/sclera: Conjunctivae normal.  Neck:     Trachea: Phonation normal. No tracheal deviation.  Cardiovascular:     Rate and Rhythm: Normal rate and regular rhythm.     Pulses: Normal pulses.          Radial pulses are 2+ on the left side.       Posterior tibial pulses are 2+ on the right side and 2+ on the left side.     Heart sounds: Normal heart sounds. No murmur. No friction rub. No gallop.  Comments: Right arm compression sleeve Pulmonary:     Effort: Pulmonary effort is normal. No respiratory distress.     Breath sounds: Normal breath sounds. No stridor. No wheezing, rhonchi or rales.  Chest:     Chest wall: No tenderness.  Abdominal:     General: Bowel sounds are normal. There is no  distension.     Palpations: Abdomen is soft.     Tenderness: There is no abdominal tenderness. There is no guarding or rebound.  Musculoskeletal:     Right lower leg: No edema.     Left lower leg: No edema.  Skin:    General: Skin is warm and dry.     Capillary Refill: Capillary refill takes less than 2 seconds.     Coloration: Skin is not jaundiced or pale.     Findings: No rash.  Neurological:     Mental Status: She is alert.     Motor: No abnormal muscle tone.  Psychiatric:        Mood and Affect: Mood normal.        Speech: Speech normal.        Behavior: Behavior normal.         PHQ2/9: Depression screen [facility] 2/9 09/19/2019 12/21/2018 09/20/2018 06/22/2018 12/20/2017  Decreased Interest 0 0 0 0 0  Down, Depressed, Hopeless 0 0 0 0 0  PHQ - 2 Score 0 0 0 0 0  Altered sleeping 0 0 0 0 -  Tired, decreased energy 0 0 0 0 -  Change in appetite 0 0 0 0 -  Feeling bad or failure about yourself  0 0 0 0 -  Trouble concentrating 0 0 0 0 -  Moving slowly or fidgety/restless 0 0 0 0 -  Suicidal thoughts 0 0 0 0 -  PHQ-9 Score 0 0 0 0 -  Difficult doing work/chores Not difficult at all Not difficult at all Not difficult at all Not difficult at all -    phq 9 is neg reviewed  Fall Risk: Fall Risk  09/19/2019 12/21/2018 09/20/2018 06/22/2018 12/20/2017  Falls in the past year? 0 0 0 0 No  Number falls in past yr: 0 0 - - -  Injury with Fall? 0 0 - - -    Functional Status Survey: Is the patient deaf or have difficulty hearing?: No Does the patient have difficulty seeing, even when wearing glasses/contacts?: No Does the patient have difficulty concentrating, remembering, or making decisions?: No Does the patient have difficulty walking or climbing stairs?: No Does the patient have difficulty dressing or bathing?: No Does the patient have difficulty doing errands alone such as visiting a doctor's office or shopping?: No   Assessment & Plan:     ICD-10-CM   1. Essential  hypertension  I10 labetalol (NORMODYNE) 200 MG tablet    hydrALAZINE (APRESOLINE) 50 MG tablet    amLODipine (NORVASC) 5 MG tablet    COMPLETE METABOLIC PANEL WITH GFR   meds for HTN from postpartum HTN urgency and cardiomyopathy, she is s/p TAH, need to adjust meds off labetalol and hydralazine TID  2. GERD without esophagitis  K21.9    Stable, well controlled  3. Malignant neoplasm of upper-outer quadrant of right breast in female, estrogen receptor positive ([place])  C50.411 anastrozole (ARIMIDEX) 1 MG tablet   Z17.0    Per specialist at [facility] status post right radical mastectomy with neuropathy secondary to chemo and right upper extremity lymphadenopathy  4. Mixed hyperlipidemia  E78.2  Lipid panel    COMPLETE METABOLIC PANEL WITH GFR   History of high cholesterol, metabolic syndrome and morbid obesity recheck lipids  5. Morbid obesity with BMI of 50.0-59.9, adult (HCC)  E66.01 CBC with Differential/Platelet   Z68.43 Lipid panel    COMPLETE METABOLIC PANEL WITH GFR    Hemoglobin A1c  6. Metabolic syndrome  U98.11 Lipid panel    COMPLETE METABOLIC PANEL WITH GFR    Hemoglobin A1c   wants to try and get meds again for weight loss/metabolic syndrome   7. IFG (impaired fasting glucose)  B14.78 COMPLETE METABOLIC PANEL WITH GFR    Hemoglobin A1c   Recheck A1c  8. Vitamin D deficiency  G95.6 COMPLETE METABOLIC PANEL WITH GFR   on weekly supplement per oncology/specialist at [facility]  9. LVH (left ventricular hypertrophy) due to hypertensive disease, without heart failure  I11.9    reviewed last 3 past ECHO - LVH improved sine 2017, noted on 12/2017 echo as mild, not noted on 04/2018 ECHO  10. H/O cardiomegaly  Z86.79    resolved, onset with pregnancy/delivery postpartum cardiomegaly suspected cardiomyopathy, with HTN urgency, improved, since 2017  11. Medication monitoring encounter  Z51.81 CBC with Differential/Platelet    Lipid panel    COMPLETE METABOLIC PANEL WITH GFR    Hemoglobin A1c  12.  Drug-induced polyneuropathy (HCC)  G62.0 DULoxetine (CYMBALTA) 30 MG capsule    vitamin B-12 (CYANOCOBALAMIN) 100 MCG tablet   Managed by [facility] oncology with topical pain meds, gabapentin and Cymbalta  13. At risk for loss of bone density  Z91.89 calcium carbonate (TUMS - DOSED IN MG ELEMENTAL CALCIUM) 500 MG chewable tablet    Vitamin D, Ergocalciferol, (DRISDOL) 1.25 MG (50000 UNIT) CAPS capsule    COMPLETE METABOLIC PANEL WITH GFR   Due to from his inhibitor, monitoring with [facility], high-dose vitamin D supplement, has done DEXA, records reviewed  14. Lymphedema of right arm  I89.0    Managed by [facility] oncology wears a compression sleeve  15. Thrombocytosis (HCC) Chronic D47.3 CBC with Differential/Platelet    Meds refilled as is - plan to do further chart review, check renal function and electrolytes and consult with Dr. Ancil Boozer on med changes   Restricted med choices due to [facility] preferred drug list   Dr. Ancil Boozer suggested benicar-HCTZ? And switching labetalol to metoprolol  Her suggestions are not all on the preferred drug list - could try switching patient to amlodipine-valsartan tablet (generic for Exforge) and bisoprolol-HCTZ tablet (generic for Ziac)  Combine current amlodipine dose with ARB, d/c hydralazine and replace labetalol with cardioselective bisoprolol   Patient has previously had an allergy of hypokalemia  So I would like to avoid any antiHTN med combo's to cause hypokalemia     Delsa Grana, PA-C 09/19/19 11:30 AM

## 2019-09-20 ENCOUNTER — Encounter: Payer: Self-pay | Admitting: Family Medicine

## 2019-09-20 DIAGNOSIS — G620 Drug-induced polyneuropathy: Secondary | ICD-10-CM | POA: Insufficient documentation

## 2019-09-20 DIAGNOSIS — T451X5A Adverse effect of antineoplastic and immunosuppressive drugs, initial encounter: Secondary | ICD-10-CM | POA: Insufficient documentation

## 2019-09-20 DIAGNOSIS — Z9189 Other specified personal risk factors, not elsewhere classified: Secondary | ICD-10-CM | POA: Insufficient documentation

## 2019-09-20 LAB — COMPLETE METABOLIC PANEL WITH GFR
AG Ratio: 1.7 (calc) (ref 1.0–2.5)
ALT: 14 U/L (ref 6–29)
AST: 14 U/L (ref 10–30)
Albumin: 4.4 g/dL (ref 3.6–5.1)
Alkaline phosphatase (APISO): 78 U/L (ref 31–125)
BUN: 13 mg/dL (ref 7–25)
CO2: 27 mmol/L (ref 20–32)
Calcium: 9.7 mg/dL (ref 8.6–10.2)
Chloride: 107 mmol/L (ref 98–110)
Creat: 0.88 mg/dL (ref 0.50–1.10)
GFR, Est African American: 99 mL/min/{1.73_m2} (ref >=60)
GFR, Est Non African American: 85 mL/min/{1.73_m2} (ref >=60)
Globulin: 2.6 g/dL (calc) (ref 1.9–3.7)
Glucose, Bld: 88 mg/dL (ref 65–99)
Potassium: 4.1 mmol/L (ref 3.5–5.3)
Sodium: 142 mmol/L (ref 135–146)
Total Bilirubin: 0.3 mg/dL (ref 0.2–1.2)
Total Protein: 7 g/dL (ref 6.1–8.1)

## 2019-09-20 LAB — CBC WITH DIFFERENTIAL/PLATELET
Absolute Monocytes: 376 cells/uL (ref 200–950)
Basophils Absolute: 48 cells/uL (ref 0–200)
Basophils Relative: 1.2 %
Eosinophils Absolute: 148 cells/uL (ref 15–500)
Eosinophils Relative: 3.7 %
HCT: 33.3 % — ABNORMAL LOW (ref 35.0–45.0)
Hemoglobin: 11.2 g/dL — ABNORMAL LOW (ref 11.7–15.5)
Lymphs Abs: 920 cells/uL (ref 850–3900)
MCH: 27.5 pg (ref 27.0–33.0)
MCHC: 33.6 g/dL (ref 32.0–36.0)
MCV: 81.8 fL (ref 80.0–100.0)
MPV: 9.1 fL (ref 7.5–12.5)
Monocytes Relative: 9.4 %
Neutro Abs: 2508 cells/uL (ref 1500–7800)
Neutrophils Relative %: 62.7 %
Platelets: 448 10*3/uL — ABNORMAL HIGH (ref 140–400)
RBC: 4.07 10*6/uL (ref 3.80–5.10)
RDW: 15 % (ref 11.0–15.0)
Total Lymphocyte: 23 %
WBC: 4 10*3/uL (ref 3.8–10.8)

## 2019-09-20 LAB — LIPID PANEL
Cholesterol: 211 mg/dL — ABNORMAL HIGH (ref <200)
HDL: 51 mg/dL (ref >=50)
LDL Cholesterol (Calc): 140 mg/dL (calc) — ABNORMAL HIGH
Non-HDL Cholesterol (Calc): 160 mg/dL (calc) — ABNORMAL HIGH (ref <130)
Total CHOL/HDL Ratio: 4.1 (calc) (ref <5.0)
Triglycerides: 92 mg/dL (ref <150)

## 2019-09-20 LAB — HEMOGLOBIN A1C
Hgb A1c MFr Bld: 5.2 % of total Hgb (ref <5.7)
Mean Plasma Glucose: 103 (calc)
eAG (mmol/L): 5.7 (calc)

## 2019-09-20 MED ORDER — ANASTROZOLE 1 MG PO TABS: 1.0000 mg | ORAL_TABLET | Freq: Every day | ORAL | 11 refills | Status: AC

## 2019-09-21 ENCOUNTER — Ambulatory Visit: Payer: Medicaid Other | Attending: Physician Assistant | Admitting: Physical Therapy

## 2019-09-21 ENCOUNTER — Other Ambulatory Visit: Payer: Self-pay

## 2019-09-21 ENCOUNTER — Encounter: Payer: Self-pay | Admitting: Physical Therapy

## 2019-09-21 ENCOUNTER — Encounter: Payer: Self-pay | Admitting: Family Medicine

## 2019-09-21 DIAGNOSIS — M25611 Stiffness of right shoulder, not elsewhere classified: Secondary | ICD-10-CM | POA: Insufficient documentation

## 2019-09-21 DIAGNOSIS — M25511 Pain in right shoulder: Secondary | ICD-10-CM | POA: Diagnosis not present

## 2019-09-21 DIAGNOSIS — M6281 Muscle weakness (generalized): Secondary | ICD-10-CM

## 2019-09-21 DIAGNOSIS — R293 Abnormal posture: Secondary | ICD-10-CM | POA: Insufficient documentation

## 2019-09-21 DIAGNOSIS — G8929 Other chronic pain: Secondary | ICD-10-CM | POA: Diagnosis not present

## 2019-09-21 NOTE — Therapy (Signed)
[facility] [address], [place], [address] Phone: [phone]   Fax:  [phone]  Physical Therapy Treatment  Patient Details  Name: Mckenzie Lopez MRN: [id number] Date of Birth: 10-19-1985 Referring Provider (PT): [name] PA (Oncology)   Encounter Date: 09/21/2019  PT End of Session - 09/21/19 1142    Visit Number  2    Number of Visits  3    Date for PT Re-Evaluation  11/15/19    Authorization Type  [place] Medicaid    Authorization Time Period  08/17/19 - 11/15/19    Authorization - Visit Number  2    Authorization - Number of Visits  3    PT Start Time  1120    PT Stop Time  1200    PT Time Calculation (min)  40 min    Equipment Utilized During Treatment  Other (comment)    Activity Tolerance  Patient tolerated treatment well    Behavior During Therapy  [facility] for tasks assessed/performed       Past Medical History:  Diagnosis Date  . BRCA negative 2019   BRCA with Dr. Bary Castilla, 7/20 Vistaseq panel neg with Elmo Putt Copland, PA-C  . CHF (congestive heart failure) ([place])    POSTPARTUM 2017/ RESOLVED  . Eczema   . Hemoglobin C trait ([place]) 11/29/2015  . Hyperlipidemia   . Hypertension   . IFG (impaired fasting glucose)   . Intraductal carcinoma of right breast 11/2017   ER/PR+; Her2neu neg; mets to LN  . Low back pain   . Migraines   . Morbid obesity ([place])     Past Surgical History:  Procedure Laterality Date  . ABDOMINAL HYSTERECTOMY  11/28/2018   Full  . AXILLARY LYMPH NODE BIOPSY Right 12/01/2017   METASTATIC MAMMARY CARCINOMA  . AXILLARY LYMPH NODE BIOPSY Left 12/01/2017   NEGATIVE FOR MALIGNANCY  . BREAST BIOPSY Right 12/01/2017   11 and 12 o'clock, INVASIVE MAMMARY CARCINOMA, ER/PR positive HER2 negative  . MASTECTOMY Right 01/05/2018    There were no vitals filed for this visit.  Subjective Assessment - 09/21/19 1120    Subjective  She reports she is doing well with  strengthening on her own, scar tissue and motion is improving but is still tight.    Pertinent History  Pt is a 34 year old female with hx of breast cancer and complaints of R shldr pain. Pt is s/p mastectomy and lymphectomy on 01/05/2018, chemotherapy, and radiation that brought on the R shoulder pain and stiffness. Pt reports skin irritation of the anterior pec, axillary region, and posterior shoulder pain. Pt history of periphreal neuropathy with numbess in bilat hands and feet, c/o of tingling present in R axillary and chest region. Pt reports muscle spasm sensation in upper trap, a "pulling sensation" in axillary region down to the Latissimus Dorsi, and a throbbing sensation in anterior pectoral region. Current pain is 7/10, at worst 10/10, at best 5/10. Pt reports stiffness in R shoulder upon waking and increasing pain throughout the day with activity. Aggravating factors include reaching OH or behind the back and immediate onset and 5 mins for pain to decrease. Easing factors include resting. Pt wears compression sleeve from hand to shoulder most of the day and overnight to decrease swelling. Pt struggles with ADLs including cooking, cleaning, dressing, caring for small child (3y/o daughter) and has been unable to function around the house at previous level from radiation. Pt goal is to decrease  pain and increase shoulder ROM and strength for ADLs.    Limitations  Lifting;House hold activities    How long can you sit comfortably?  10-15 minutes (shoulder)    How long can you stand comfortably?  10-15 minutes (also limited by pedal neuropathy)    How long can you walk comfortably?  about 15 minutes    Diagnostic tests  No imaging completed recently    Patient Stated Goals  To complete ADLs    Pain Onset  More than a month ago          After manual techniques  flex 150d abd 130d IR L4 ER C8  THEREX -Pulleys abd x89mns flex x297ms - Y standing on wall 3x 12 with min cuing to prevent trunk  rotation with good carry over following - Bent over flys with 1# DB 3x 10 with good carry over of proper technique following initial demo and cuing   MAN THER -PROM for FLEX, ABD for 3x30 sec hold with inferior and posterior glide for increasing mobility -Grade I/II mobs inferior, lateral glides for pain reduction 3 bouts for 30 sec -Trigger point release in pec major muscle 2x15 sec holds with decreased pain noted and some decreased tension noted by PT STM to scar [id number]/flexwith scar massage                       PT Education - 09/21/19 1142    Education Details  therex form, manual techniques    Person(s) Educated  Patient    Methods  Explanation;Demonstration;Verbal cues    Comprehension  Verbalized understanding;Returned demonstration;Verbal cues required       PT Short Term Goals - 07/27/19 1036      PT SHORT TERM GOAL #1   Title  In 4 weeks, pt will decrease pain score by 2 point in order to make progress towards demonstrating clinically significant improvement in pain levels.    Baseline  05/25/19 7/10 pain; 06/09/2019 8/10; 07/24/19 5/10    Time  4    Period  Weeks    Status  Achieved    Target Date  06/22/19      PT SHORT TERM GOAL #2   Title  Pt will be independent with HEP in order to improve strength and balance in order to decrease fall risk and improve function at home and work.    Baseline  05/25/19 HEP given; compliant with exercise 07/24/19    Time  4    Period  Weeks    Status  Achieved        PT Long Term Goals - 07/27/19 1036      PT LONG TERM GOAL #1   Title  In 8 weeks, pt will decrease pain score by 4 points in order to improve functional ADL capability and care for young daughter.    Baseline  05/26/19 7/10 pain; 06/09/2019 8/10 pain; 07/24/19 5/10; 07/27/19 4/10    Time  8    Period  Weeks    Status  Achieved      PT LONG TERM GOAL #2   Title  In 8 weeks, pt will improve AROM of R shoulder WNL in order to perform  personal grooming and household ADLs    Baseline  05/26/19 ER: R occiput IR R PSIS Flexion 90; Abduction 82 both painful; 06/09/2019 ER: R occiput IR PSIS Flexion 100 ABD 95 painful for flexion/ABD; 03/152021 ER: C5 IR L3 ABD 90d Flex 115d  Time  8    Period  Weeks    Status  Deferred      PT LONG TERM GOAL #3   Title  In 8 weeks, pt will improve gross R shoulder and periscapular strength to 4+/5 MMT to perform heavy household chores and provide care to young daughter .    Baseline  05/26/19 Flexion/ABD/IR 4/5; lower trapezius 2-/5; scap retractors 3/5; I position 4/5; 06/09/2019 Flexion 4+/5, ABD 4/5, IR4/5 ER 4/5, lower trap 2+/5, middle trap 3/5, extension 4-/5 07/24/19 Flexion 4+/5, ABD 4+/5, IR 4+/5 ER 4+/5, lower trap 3/5, middle trap 4/5, extension 4+/5    Time  8    Period  Weeks    Status  Deferred      PT LONG TERM GOAL #4   Title  Patient will increase FOTO score to 60 to demonstrate predicted increase in functional mobility to complete ADLs    Baseline  05/26/19 30; 06/09/2019 34 07/27/19 48    Time  8    Period  Weeks    Status  On-going            Plan - 09/21/19 1146    Clinical Impression Statement  PT continued to utilize manual techniques for increased scar mobility and shoulder mobility, and therex for increased motion/strength with good success. Patient is able to comply with all cuing for proper technique of therex/proper muscle activation with good motivation    Personal Factors and Comorbidities  Comorbidity 3+;Past/Current Experience;Time since onset of injury/illness/exacerbation;Profession    Examination-Activity Limitations  Caring for Others;Dressing;Carry;Hygiene/Grooming;Bathing    Examination-Participation Restrictions  Cleaning;Laundry;Community Activity    Stability/Clinical Decision Making  Evolving/Moderate complexity    Clinical Decision Making  Moderate    Rehab Potential  Good    PT Frequency  2x / week    PT Duration  8 weeks    PT  Treatment/Interventions  ADLs/Self Care Home Management;Aquatic Therapy;Functional mobility training;Therapeutic activities;Therapeutic exercise;Manual techniques;Compression bandaging;Neuromuscular re-education    PT Next Visit Plan  mobility, parascapular strength    PT Home Exercise Plan  doorway pec stretch, supine snow angels, table slides (flex/abd), supine long direction pec stretch, lateral neck flexion stretch for upper trap    Consulted and Agree with Plan of Care  Patient       Patient will benefit from skilled therapeutic intervention in order to improve the following deficits and impairments:  Decreased activity tolerance, Decreased range of motion, Decreased strength, Hypomobility, Impaired sensation, Impaired UE functional use, Pain, Decreased mobility, Decreased scar mobility, Increased edema, Postural dysfunction  Visit Diagnosis: Muscle weakness (generalized)  Abnormal posture  Stiffness of right shoulder, not elsewhere classified  Chronic right shoulder pain     Problem List Patient Active Problem List   Diagnosis Date Noted  . Drug-induced polyneuropathy ([place]) 09/20/2019  . At risk for loss of bone density 09/20/2019  . LVH (left ventricular hypertrophy) due to hypertensive disease, without heart failure 09/19/2019  . H/O cardiomegaly 09/19/2019  . Metabolic syndrome 66/59/9357  . S/P hysterectomy with oophorectomy 12/30/2018  . Lymphedema of right arm 12/21/2018  . GERD without esophagitis 12/21/2018  . Malignant neoplasm of upper-outer quadrant of right breast in female, estrogen receptor positive ([place]) 12/07/2017  . Family history of breast cancer 10/13/2016  . Dysplasia of cervix, low grade (CIN 1) 10/13/2016  . Thrombocytosis ([place]) 03/16/2016  . Hemoglobin C trait ([place]) 05/08/2015  . Vitamin D deficiency 02/21/2015  . Morbid obesity with BMI of 50.0-59.9, adult ([place])   .  Hyperlipidemia   . Hypertension   . IFG (impaired fasting glucose)   . Migraines     Shelton Silvas PT, DPT Shelton Silvas 09/21/2019, 11:58 AM  [facility] [address] [place], [address] Phone: [phone]   Fax:  [phone]  Name: Mckenzie Lopez MRN: [id number] Date of Birth: 1985/09/13

## 2019-09-25 MED ORDER — OLOPATADINE HCL 0.2 % OP SOLN: OPHTHALMIC | 11 refills | Status: DC

## 2019-09-26 DIAGNOSIS — Z9011 Acquired absence of right breast and nipple: Secondary | ICD-10-CM | POA: Diagnosis not present

## 2019-09-26 DIAGNOSIS — C50811 Malignant neoplasm of overlapping sites of right female breast: Secondary | ICD-10-CM | POA: Diagnosis not present

## 2019-09-26 DIAGNOSIS — I890 Lymphedema, not elsewhere classified: Secondary | ICD-10-CM | POA: Diagnosis not present

## 2019-09-26 DIAGNOSIS — Z853 Personal history of malignant neoplasm of breast: Secondary | ICD-10-CM | POA: Diagnosis not present

## 2019-09-26 DIAGNOSIS — R252 Cramp and spasm: Secondary | ICD-10-CM | POA: Diagnosis not present

## 2019-09-26 DIAGNOSIS — M810 Age-related osteoporosis without current pathological fracture: Secondary | ICD-10-CM | POA: Diagnosis not present

## 2019-09-26 DIAGNOSIS — Z79811 Long term (current) use of aromatase inhibitors: Secondary | ICD-10-CM | POA: Diagnosis not present

## 2019-09-26 DIAGNOSIS — Z90710 Acquired absence of both cervix and uterus: Secondary | ICD-10-CM | POA: Diagnosis not present

## 2019-09-26 DIAGNOSIS — Z170 Estrogen receptor positive status [ER+]: Secondary | ICD-10-CM | POA: Diagnosis not present

## 2019-09-26 DIAGNOSIS — E559 Vitamin D deficiency, unspecified: Secondary | ICD-10-CM | POA: Diagnosis not present

## 2019-09-26 DIAGNOSIS — M25561 Pain in right knee: Secondary | ICD-10-CM | POA: Diagnosis not present

## 2019-09-26 DIAGNOSIS — Z90721 Acquired absence of ovaries, unilateral: Secondary | ICD-10-CM | POA: Diagnosis not present

## 2019-09-26 DIAGNOSIS — G43909 Migraine, unspecified, not intractable, without status migrainosus: Secondary | ICD-10-CM | POA: Diagnosis not present

## 2019-09-26 DIAGNOSIS — Z08 Encounter for follow-up examination after completed treatment for malignant neoplasm: Secondary | ICD-10-CM | POA: Diagnosis not present

## 2019-09-26 DIAGNOSIS — Z9189 Other specified personal risk factors, not elsewhere classified: Secondary | ICD-10-CM | POA: Diagnosis not present

## 2019-09-26 DIAGNOSIS — E785 Hyperlipidemia, unspecified: Secondary | ICD-10-CM | POA: Diagnosis not present

## 2019-09-26 DIAGNOSIS — Z79899 Other long term (current) drug therapy: Secondary | ICD-10-CM | POA: Diagnosis not present

## 2019-09-26 DIAGNOSIS — G620 Drug-induced polyneuropathy: Secondary | ICD-10-CM | POA: Diagnosis not present

## 2019-09-26 DIAGNOSIS — I10 Essential (primary) hypertension: Secondary | ICD-10-CM | POA: Diagnosis not present

## 2019-09-28 ENCOUNTER — Other Ambulatory Visit: Payer: Self-pay | Admitting: Family Medicine

## 2019-09-28 ENCOUNTER — Telehealth (INDEPENDENT_AMBULATORY_CARE_PROVIDER_SITE_OTHER): Payer: Medicaid Other | Admitting: Family Medicine

## 2019-09-28 ENCOUNTER — Encounter: Payer: Self-pay | Admitting: Family Medicine

## 2019-09-28 VITALS — BP 124/77 | Ht 65.0 in | Wt 300.0 lb

## 2019-09-28 DIAGNOSIS — I10 Essential (primary) hypertension: Secondary | ICD-10-CM | POA: Diagnosis not present

## 2019-09-28 MED ORDER — VALSARTAN-HYDROCHLOROTHIAZIDE 160-12.5 MG PO TABS: 1.0000 | ORAL_TABLET | Freq: Every day | ORAL | 2 refills | Status: DC

## 2019-09-28 MED ORDER — METOPROLOL SUCCINATE ER 50 MG PO TB24: 50.0000 mg | ORAL_TABLET | Freq: Every day | ORAL | 2 refills | Status: DC

## 2019-09-28 NOTE — Progress Notes (Signed)
Patient ID: Mckenzie Lopez, female    DOB: Nov 26, 1985, 34 y.o.   MRN: [id number]  PCP: Delsa Grana, PA-C  Chief Complaint  Patient presents with  . Follow-up  . Hypertension    discuss changes in med    Subjective:   Mckenzie Lopez is a 34 y.o. female, presents to clinic with CC of the following:  HPI  F/up to discuss med changes Last office visit note was extensive with significant chart review, reviewed that today with her including her most recent echoes and her most recent lab results. Patient would like to switch from 3 times a day medications for her blood pressure that she has been on since 2017 We discussed options based on her insurance coverage today, and patient will switch medications over the next several weeks.   Today her blood pressure is well controlled - 124/77     Patient Active Problem List   Diagnosis Date Noted  . Drug-induced polyneuropathy ([place]) 09/20/2019  . At risk for loss of bone density 09/20/2019  . LVH (left ventricular hypertrophy) due to hypertensive disease, without heart failure 09/19/2019  . H/O cardiomegaly 09/19/2019  . Metabolic syndrome [id number]  . S/P hysterectomy with oophorectomy 12/30/2018  . Lymphedema of right arm 12/21/2018  . GERD without esophagitis 12/21/2018  . Malignant neoplasm of upper-outer quadrant of right breast in female, estrogen receptor positive ([place]) 12/07/2017  . Family history of breast cancer 10/13/2016  . Dysplasia of cervix, low grade (CIN 1) 10/13/2016  . Thrombocytosis ([place]) 03/16/2016  . Hemoglobin C trait ([place]) 05/08/2015  . Vitamin D deficiency 02/21/2015  . Morbid obesity with BMI of 50.0-59.9, adult ([place])   . Hyperlipidemia   . Hypertension   . IFG (impaired fasting glucose)   . Migraines       Current Outpatient Medications:  .  amLODipine (NORVASC) 5 MG tablet, Take 1 tablet (5 mg total) by mouth daily., Disp: 90 tablet, Rfl: 3 .  anastrozole (ARIMIDEX) 1 MG tablet,  Take 1 tablet (1 mg total) by mouth daily., Disp: 30 tablet, Rfl: 11 .  Blood Pressure Monitoring (BLOOD PRESSURE MONITOR/L CUFF) MISC, Large cuff; check BP; fluctuating blood pressures; HTN; LON 99 months, Disp: 1 each, Rfl: 0 .  calcium carbonate (TUMS - DOSED IN MG ELEMENTAL CALCIUM) 500 MG chewable tablet, Chew 1 tablet by mouth daily., Disp: , Rfl:  .  Cholecalciferol (VITAMIN D) 2000 UNITS CAPS, Take 2,000 Units by mouth daily., Disp: , Rfl:  .  DULoxetine (CYMBALTA) 30 MG capsule, Take 30 mg by mouth daily., Disp: , Rfl:  .  fluticasone (FLONASE) 50 MCG/ACT nasal spray, SHAKE LIQUID AND USE 2 SPRAYS IN EACH NOSTRIL DAILY AS NEEDED FOR ALLERGIES, Disp: 48 g, Rfl: 0 .  gabapentin (NEURONTIN) 100 MG capsule, , Disp: , Rfl:  .  gabapentin (NEURONTIN) 300 MG capsule, Take 300 mg by mouth 3 (three) times daily., Disp: , Rfl:  .  hydrALAZINE (APRESOLINE) 50 MG tablet, Take 1 tablet (50 mg total) by mouth 3 (three) times daily., Disp: 180 tablet, Rfl: 1 .  Insulin Pen Needle (NOVOFINE) 32G X 6 MM MISC, 1 each by Does not apply route daily., Disp: 100 each, Rfl: 1 .  labetalol (NORMODYNE) 200 MG tablet, TAKE 1 TABLET BY MOUTH THREE TIMES DAILY, Disp: 270 tablet, Rfl: 3 .  loratadine (CLARITIN) 10 MG tablet, Take 10 mg by mouth daily., Disp: , Rfl:  .  Magnesium 250 MG TABS, Take by mouth., Disp: ,  Rfl:  .  Olopatadine HCl 0.2 % SOLN, One drop in each eye daily, Disp: 2.5 mL, Rfl: 11 .  vitamin B-12 (CYANOCOBALAMIN) 100 MCG tablet, Take 100 mcg by mouth daily., Disp: , Rfl:  .  Vitamin D, Ergocalciferol, (DRISDOL) 1.25 MG (50000 UNIT) CAPS capsule, Take 1 capsule (50,000 Units total) by mouth every 7 (seven) days., Disp: 24 capsule, Rfl: 0   Allergies  Allergen Reactions  . Chlorthalidone Other (See Comments)    Severe hypokalemia     Family History  Problem Relation Age of Onset  . Diabetes Mother   . Hypertension Mother   . Thyroid disease Mother   . Breast cancer Maternal Grandmother          ?age  . Diabetes Maternal Grandfather   . Hypertension Maternal Grandfather   . Seizures Maternal Grandfather   . Breast cancer Maternal Aunt        early 80's  . Diabetes Sister   . Diabetes Paternal Grandmother   . Heart disease Neg Hx   . Stroke Neg Hx   . COPD Neg Hx   . Ovarian cancer Neg Hx      Social History   Socioeconomic History  . Marital status: Single    Spouse name: Not on file  . Number of children: Not on file  . Years of education: Not on file  . Highest education level: Not on file  Occupational History  . Not on file  Tobacco Use  . Smoking status: Never Smoker  . Smokeless tobacco: Never Used  Substance and Sexual Activity  . Alcohol use: No  . Drug use: No  . Sexual activity: Not Currently    Birth control/protection: None  Other Topics Concern  . Not on file  Social History Narrative  . Not on file   Social Determinants of Health   Financial Resource Strain:   . Difficulty of Paying Living Expenses:   Food Insecurity:   . Worried About Charity fundraiser in the Last Year:   . Arboriculturist in the Last Year:   Transportation Needs:   . Film/video editor (Medical):   Marland Kitchen Lack of Transportation (Non-Medical):   Physical Activity:   . Days of Exercise per Week:   . Minutes of Exercise per Session:   Stress:   . Feeling of Stress :   Social Connections:   . Frequency of Communication with Friends and Family:   . Frequency of Social Gatherings with Friends and Family:   . Attends Religious Services:   . Active Member of Clubs or Organizations:   . Attends Archivist Meetings:   Marland Kitchen Marital Status:   Intimate Partner Violence:   . Fear of Current or Ex-Partner:   . Emotionally Abused:   Marland Kitchen Physically Abused:   . Sexually Abused:     Chart Review Today: I personally reviewed active problem list, medication list, allergies, family history, social history, health maintenance, notes from last encounter, lab results,  imaging with the patient/caregiver today.   Review of Systems 10 Systems reviewed and are negative for acute change except as noted in the HPI.    Objective:   Vitals:   09/28/19 1358  Weight: 300 lb (136.1 kg)  Height: 5\' 5"  (1.651 m)    Body mass index is 49.92 kg/m.  Physical Exam Vitals and nursing note reviewed.  Constitutional:      Appearance: Normal appearance. She is obese.  Neurological:  Mental Status: She is alert.      Results for orders placed or performed in visit on 09/19/19  CBC with Differential/Platelet  Result Value Ref Range   WBC 4.0 3.8 - 10.8 Thousand/uL   RBC 4.07 3.80 - 5.10 Million/uL   Hemoglobin 11.2 (L) 11.7 - 15.5 g/dL   HCT 33.3 (L) 35.0 - 45.0 %   MCV 81.8 80.0 - 100.0 fL   MCH 27.5 27.0 - 33.0 pg   MCHC 33.6 32.0 - 36.0 g/dL   RDW 15.0 11.0 - 15.0 %   Platelets 448 (H) 140 - 400 Thousand/uL   MPV 9.1 7.5 - 12.5 fL   Neutro Abs 2,508 1,500 - 7,800 cells/uL   Lymphs Abs 920 850 - 3,900 cells/uL   Absolute Monocytes 376 200 - 950 cells/uL   Eosinophils Absolute 148 15 - 500 cells/uL   Basophils Absolute 48 0 - 200 cells/uL   Neutrophils Relative % 62.7 %   Total Lymphocyte 23.0 %   Monocytes Relative 9.4 %   Eosinophils Relative 3.7 %   Basophils Relative 1.2 %  Lipid panel  Result Value Ref Range   Cholesterol 211 (H) <200 mg/dL   HDL 51 > OR = 50 mg/dL   Triglycerides 92 <150 mg/dL   LDL Cholesterol (Calc) 140 (H) mg/dL (calc)   Total CHOL/HDL Ratio 4.1 <5.0 (calc)   Non-HDL Cholesterol (Calc) 160 (H) <130 mg/dL (calc)  COMPLETE METABOLIC PANEL WITH GFR  Result Value Ref Range   Glucose, Bld 88 65 - 99 mg/dL   BUN 13 7 - 25 mg/dL   Creat 0.88 0.50 - 1.10 mg/dL   GFR, Est Non African American 85 > OR = 60 mL/min/1.61m2   GFR, Est African American 99 > OR = 60 mL/min/1.61m2   BUN/Creatinine Ratio NOT APPLICABLE 6 - 22 (calc)   Sodium 142 135 - 146 mmol/L   Potassium 4.1 3.5 - 5.3 mmol/L   Chloride 107 98 - 110 mmol/L     CO2 27 20 - 32 mmol/L   Calcium 9.7 8.6 - 10.2 mg/dL   Total Protein 7.0 6.1 - 8.1 g/dL   Albumin 4.4 3.6 - 5.1 g/dL   Globulin 2.6 1.9 - 3.7 g/dL (calc)   AG Ratio 1.7 1.0 - 2.5 (calc)   Total Bilirubin 0.3 0.2 - 1.2 mg/dL   Alkaline phosphatase (APISO) 78 31 - 125 U/L   AST 14 10 - 30 U/L   ALT 14 6 - 29 U/L  Hemoglobin A1c  Result Value Ref Range   Hgb A1c MFr Bld 5.2 <5.7 % of total Hgb   Mean Plasma Glucose 103 (calc)   eAG (mmol/L) 5.7 (calc)        Assessment & Plan:      ICD-10-CM   1. Essential hypertension  I10 metoprolol succinate (TOPROL XL) 50 MG 24 hr tablet    valsartan-hydrochlorothiazide (DIOVAN HCT) 160-12.5 MG tablet     Pt for the next week with stop labetalol and start metoprolol discussed with her increasing or decreasing the metoprolol dose depending on side effects or symptoms.  Switch to 50 mg XL will be able to decrease or increase based on palpitations, rapid heart rate or exertional symptoms.  If that goes well over the next week and patient will next decrease her hydralazine 50 mg from 3 times daily to twice daily - will try and get her off hydralazine while adding valsartan-HCTZ (if covered by insurance - it is on preferred drug  list).  She has f/up appt in 2 weeks.  Med changes and tapering sent in detail to pt with AVS and additional Mychart message.  Close f/up if any problems with tapers and med changes  Explained that most med changes will become their most steady within 2 weeks of starting the meds.  Greater than 50% of this visit was spent in direct face-to-face counseling, obtaining history and physical, discussing and educating pt on treatment plan.  Total time of this visit was 30+ min.  Remainder of time involved but was not limited to reviewing chart (recent and pertinent OV notes and labs), documentation in EMR, and coordinating care and treatment plan.   Delsa Grana, PA-C 09/28/19 3:38 PM

## 2019-09-28 NOTE — Patient Instructions (Addendum)
I sent metoprolol 50 mg to your pharmacy- It is an extended release beta-blocker, similar to your labetalol  You will stop your labetalol and start your metoprolol once a day, take in the morning with food  I suspect that your labetalol dose and frequency is stronger than the metoprolol but I did not want to give you bad side effects.  If you feel any rapid heart rate or palpitations when switching to metoprolol then double the dose and take 100 mg in the morning daily with food  If you have any side effects such as feeling tired getting fatigued easily short of breath or feeling like he might pass out with some physical activity or walking then I want you to decrease the dose to 25 mg (break in half).  Please send me a MyChart message      If that goes well I want you to hold your middle of the day dose of hydralazine and continue to take a morning and evening dose and monitor your blood pressure for a few days.  If you tolerate these changes in the next 7-10 days, then you can start the second medicine -   Valsartan-HCTZ, take one tablet in the morning, and when you start valsartan-HCTZ also DECREASE hydralazine dose to 25 mg in the morning and 25 mg in the evening.    Monitor your BP for a few days.  If BP starts to decrease when starting valsartan-HCTZ - then we will want to stop hydralazine completely  Our goal will be to have you on less BP pills, less frequently  Metoprolol once a day in the morning Amlodipine 5 mg once daily Valsartan-HCTZ once daily   BP and HR ranges that are normal and ok:   BP 100/60 to 140/90 HR 60 to 99

## 2019-10-10 ENCOUNTER — Other Ambulatory Visit: Payer: Self-pay

## 2019-10-10 ENCOUNTER — Ambulatory Visit: Payer: Medicaid Other | Attending: Physician Assistant | Admitting: Physical Therapy

## 2019-10-10 ENCOUNTER — Encounter: Payer: Self-pay | Admitting: Physical Therapy

## 2019-10-10 DIAGNOSIS — G8929 Other chronic pain: Secondary | ICD-10-CM | POA: Diagnosis not present

## 2019-10-10 DIAGNOSIS — M25611 Stiffness of right shoulder, not elsewhere classified: Secondary | ICD-10-CM

## 2019-10-10 DIAGNOSIS — R293 Abnormal posture: Secondary | ICD-10-CM | POA: Diagnosis not present

## 2019-10-10 DIAGNOSIS — M6281 Muscle weakness (generalized): Secondary | ICD-10-CM | POA: Diagnosis not present

## 2019-10-10 DIAGNOSIS — M25511 Pain in right shoulder: Secondary | ICD-10-CM | POA: Insufficient documentation

## 2019-10-10 NOTE — Therapy (Signed)
[facility] [address] [place], [address] Phone: [phone]   Fax:  [phone]  Physical Therapy Treatment/DC Summary Reporting Period 08/17/19 - 10/10/19  Patient Details  Name: Mckenzie Lopez MRN: [id number] Date of Birth: 1986-01-10 Referring Provider (PT): [name] PA (Oncology)   Encounter Date: 10/10/2019  PT End of Session - 10/10/19 1122    Visit Number  3    Number of Visits  3    Date for PT Re-Evaluation  11/15/19    Authorization Type  [place] [employer]    Authorization Time Period  08/17/19 - 11/15/19    Authorization - Visit Number  3    Authorization - Number of Visits  3    PT Start Time  7680    PT Stop Time  1200    PT Time Calculation (min)  42 min    Activity Tolerance  Patient tolerated treatment well    Behavior During Therapy  [facility] for tasks assessed/performed       Past Medical History:  Diagnosis Date  . BRCA negative 2019   BRCA with Dr. Bary Castilla, 7/20 Vistaseq panel neg with Elmo Putt Copland, PA-C  . CHF (congestive heart failure) ([place])    POSTPARTUM 2017/ RESOLVED  . Eczema   . Hemoglobin C trait ([place]) 11/29/2015  . Hyperlipidemia   . Hypertension   . IFG (impaired fasting glucose)   . Intraductal carcinoma of right breast 11/2017   ER/PR+; Her2neu neg; mets to LN  . Low back pain   . Migraines   . Morbid obesity ([place])     Past Surgical History:  Procedure Laterality Date  . ABDOMINAL HYSTERECTOMY  11/28/2018   Full  . AXILLARY LYMPH NODE BIOPSY Right 12/01/2017   METASTATIC MAMMARY CARCINOMA  . AXILLARY LYMPH NODE BIOPSY Left 12/01/2017   NEGATIVE FOR MALIGNANCY  . BREAST BIOPSY Right 12/01/2017   11 and 12 o'clock, INVASIVE MAMMARY CARCINOMA, ER/PR positive HER2 negative  . MASTECTOMY Right 01/05/2018    There were no vitals filed for this visit.  Subjective Assessment - 10/10/19 1120    Subjective  Reports she is doing well with strengthening and  completing her HEP. She reports her scar tissue is still tight, but has came a long way.    Pertinent History  Pt is a 34 year old female with hx of breast cancer and complaints of R shldr pain. Pt is s/p mastectomy and lymphectomy on 01/05/2018, chemotherapy, and radiation that brought on the R shoulder pain and stiffness. Pt reports skin irritation of the anterior pec, axillary region, and posterior shoulder pain. Pt history of periphreal neuropathy with numbess in bilat hands and feet, c/o of tingling present in R axillary and chest region. Pt reports muscle spasm sensation in upper trap, a "pulling sensation" in axillary region down to the Latissimus Dorsi, and a throbbing sensation in anterior pectoral region. Current pain is 7/10, at worst 10/10, at best 5/10. Pt reports stiffness in R shoulder upon waking and increasing pain throughout the day with activity. Aggravating factors include reaching OH or behind the back and immediate onset and 5 mins for pain to decrease. Easing factors include resting. Pt wears compression sleeve from hand to shoulder most of the day and overnight to decrease swelling. Pt struggles with ADLs including cooking, cleaning, dressing, caring for small child (3y/o daughter) and has been unable to function around the house at previous level from radiation. Pt goal is to decrease  pain and increase shoulder ROM and strength for ADLs.    Limitations  Lifting;House hold activities    How long can you sit comfortably?  10-15 minutes (shoulder)    How long can you stand comfortably?  10-15 minutes (also limited by pedal neuropathy)    How long can you walk comfortably?  about 15 minutes    Diagnostic tests  No imaging completed recently    Patient Stated Goals  To complete ADLs    Pain Onset  More than a month ago        [place] abd x70mns flex x257ms -Y standing on wall x12; with YTB x12  Review of previous HEP with addition of the Y on wall exercise, and scar  massage on wall with tennis ball  MAN THER -PROM for FLEX, ABD for 3x30 sec hold with inferior and posterior glide for increasing mobility -Grade I/II mobs inferior, lateral glides for pain reduction 3 bouts for 30 sec -Trigger point release in pec major muscle 2x15 sec holds with decreased pain noted and some decreased tension noted by PT STM to scar incision1213mabd/flexwith scar massage                         PT Education - 10/10/19 1154    Education Details  therex form; HEP recommendations, d/c goal re-assessment    Person(s) Educated  Patient    Methods  Explanation;Demonstration;Handout;Verbal cues    Comprehension  Verbalized understanding;Returned demonstration;Verbal cues required       PT Short Term Goals - 07/27/19 1036      PT SHORT TERM GOAL #1   Title  In 4 weeks, pt will decrease pain score by 2 point in order to make progress towards demonstrating clinically significant improvement in pain levels.    Baseline  05/25/19 7/10 pain; 06/09/2019 8/10; 07/24/19 5/10    Time  4    Period  Weeks    Status  Achieved    Target Date  06/22/19      PT SHORT TERM GOAL #2   Title  Pt will be independent with HEP in order to improve strength and balance in order to decrease fall risk and improve function at home and work.    Baseline  05/25/19 HEP given; compliant with exercise 07/24/19    Time  4    Period  Weeks    Status  Achieved        PT Long Term Goals - 10/10/19 1123      PT LONG TERM GOAL #1   Title  In 8 weeks, pt will decrease pain score by 4 points in order to improve functional ADL capability and care for young daughter.    Baseline  05/26/19 7/10 pain; 06/09/2019 8/10 pain; 07/24/19 5/10; 07/27/19 4/10; 10/10/19 3/10    Time  8    Period  Weeks    Status  Achieved      PT LONG TERM GOAL #2   Title  In 8 weeks, pt will improve AROM of R shoulder WNL in order to perform personal grooming and household ADLs    Baseline  05/26/19 ER: R  occiput IR R PSIS Flexion 90; Abduction 82 both painful; 06/09/2019 ER: R occiput IR PSIS Flexion 100 ABD 95 painful for flexion/ABD; 03/152021 ER: C5 IR L3 ABD 90d Flex 115d; 10/10/19 ER: C6 IR T12 ABD 109d Flex 145d    Time  8    Period  Weeks  Status  Partially Met      PT LONG TERM GOAL #3   Title  In 8 weeks, pt will improve gross R shoulder and periscapular strength to 4+/5 MMT to perform heavy household chores and provide care to young daughter .    Baseline  05/26/19 Flexion/ABD/IR 4/5; lower trapezius 2-/5; scap retractors 3/5; I position 4/5; 06/09/2019 Flexion 4+/5, ABD 4/5, IR4/5 ER 4/5, lower trap 2+/5, middle trap 3/5, extension 4-/5 07/24/19 Flexion 4+/5, ABD 4+/5, IR 4+/5 ER 4+/5, lower trap 3/5, middle trap 4/5, extension 4+/5; 10/10/19 Flexion 5/5, ABD 5/5, IR 5/5 ER 5/5, lower trap 4-/5, middle trap 4+/5, extension 5/5    Time  8    Period  Weeks    Status  Achieved      PT LONG TERM GOAL #4   Title  Patient will increase FOTO score to 60 to demonstrate predicted increase in functional mobility to complete ADLs    Baseline  05/26/19 30; 06/09/2019 34 07/27/19 48; 10/10/19 74    Time  8    Period  Weeks    Status  Achieved            Plan - 10/10/19 1253    Clinical Impression Statement  PT reassessed patient goals this session as patient has maxed her authorized visit amount. Patient has met/exceeded all goals, except the last 20d of overhead motion and behind-back. Patient is improving in all these metrics however, and feels confedient that she can continue to improve through robust HEP. PT reviewed HEP with addition of self-scar massage and Y on wall exercise. Pt given clinic contact info should any further questions/concerns arise.    Personal Factors and Comorbidities  Comorbidity 3+;Past/Current Experience;Time since onset of injury/illness/exacerbation;Profession    Examination-Activity Limitations  Caring for Others;Dressing;Carry;Hygiene/Grooming;Bathing     Examination-Participation Restrictions  Cleaning;Laundry;Community Activity    Stability/Clinical Decision Making  Evolving/Moderate complexity    Clinical Decision Making  Moderate    Rehab Potential  Good    PT Frequency  2x / week    PT Duration  8 weeks    PT Treatment/Interventions  ADLs/Self Care Home Management;Aquatic Therapy;Functional mobility training;Therapeutic activities;Therapeutic exercise;Manual techniques;Compression bandaging;Neuromuscular re-education    PT Next Visit Plan  mobility, parascapular strength    PT Home Exercise Plan  doorway pec stretch, supine snow angels, table slides (flex/abd), supine long direction pec stretch, lateral neck flexion stretch for upper trap    Consulted and Agree with Plan of Care  Patient       Patient will benefit from skilled therapeutic intervention in order to improve the following deficits and impairments:  Decreased activity tolerance, Decreased range of motion, Decreased strength, Hypomobility, Impaired sensation, Impaired UE functional use, Pain, Decreased mobility, Decreased scar mobility, Increased edema, Postural dysfunction  Visit Diagnosis: Muscle weakness (generalized)  Abnormal posture  Stiffness of right shoulder, not elsewhere classified  Chronic right shoulder pain     Problem List Patient Active Problem List   Diagnosis Date Noted  . Drug-induced polyneuropathy ([place]) 09/20/2019  . At risk for loss of bone density 09/20/2019  . LVH (left ventricular hypertrophy) due to hypertensive disease, without heart failure 09/19/2019  . H/O cardiomegaly 09/19/2019  . Metabolic syndrome 16/02/9603  . S/P hysterectomy with oophorectomy 12/30/2018  . Lymphedema of right arm 12/21/2018  . GERD without esophagitis 12/21/2018  . Malignant neoplasm of upper-outer quadrant of right breast in female, estrogen receptor positive ([place]) 12/07/2017  . Family history of breast cancer 10/13/2016  .  Dysplasia of cervix, low grade  (CIN 1) 10/13/2016  . Thrombocytosis ([place]) 03/16/2016  . Hemoglobin C trait ([place]) 05/08/2015  . Vitamin D deficiency 02/21/2015  . Morbid obesity with BMI of 50.0-59.9, adult ([place])   . Hyperlipidemia   . Hypertension   . IFG (impaired fasting glucose)   . Migraines    Shelton Silvas PT, DPT Shelton Silvas 10/10/2019, 12:59 PM  [facility] [address], [place], [address] Phone: [phone]   Fax:  [phone]  Name: Kenetra Shirlee Whitmire MRN: [id number] Date of Birth: 12-24-1985

## 2019-10-13 ENCOUNTER — Ambulatory Visit: Payer: Medicaid Other | Admitting: Family Medicine

## 2019-10-13 ENCOUNTER — Encounter: Payer: Self-pay | Admitting: Family Medicine

## 2019-10-13 ENCOUNTER — Other Ambulatory Visit: Payer: Self-pay

## 2019-10-13 VITALS — BP 126/84 | HR 97 | Temp 97.8°F | Resp 14 | Ht 65.0 in | Wt 303.7 lb

## 2019-10-13 DIAGNOSIS — Z6841 Body Mass Index (BMI) 40.0 and over, adult: Secondary | ICD-10-CM

## 2019-10-13 DIAGNOSIS — R7301 Impaired fasting glucose: Secondary | ICD-10-CM

## 2019-10-13 DIAGNOSIS — E782 Mixed hyperlipidemia: Secondary | ICD-10-CM

## 2019-10-13 DIAGNOSIS — I10 Essential (primary) hypertension: Secondary | ICD-10-CM | POA: Diagnosis not present

## 2019-10-13 DIAGNOSIS — E8881 Metabolic syndrome: Secondary | ICD-10-CM

## 2019-10-13 MED ORDER — VICTOZA 18 MG/3ML ~~LOC~~ SOPN: PEN_INJECTOR | SUBCUTANEOUS | 3 refills | Status: DC

## 2019-10-13 MED ORDER — METOPROLOL SUCCINATE ER 50 MG PO TB24: 50.0000 mg | ORAL_TABLET | Freq: Every day | ORAL | 3 refills | Status: DC

## 2019-10-13 MED ORDER — VALSARTAN-HYDROCHLOROTHIAZIDE 160-12.5 MG PO TABS: 1.0000 | ORAL_TABLET | Freq: Every day | ORAL | 3 refills | Status: DC

## 2019-10-13 NOTE — Progress Notes (Signed)
Patient ID: Mckenzie Lopez, female    DOB: Apr 06, 1986, 34 y.o.   MRN: [id number]  PCP: Delsa Grana, PA-C  Chief Complaint  Patient presents with  . Follow-up  . Hypertension    Subjective:   Mckenzie Lopez is a 34 y.o. female, presents to clinic with CC of the following:  HPI   Here for BP f/up after multiple med changes.  Pt has been on labetalol and hydralazine TID since pregnancy/delivery, meds have been changed in cross taper to allow once daily dosing of medications for blood pressure control.  She has been monitoring her blood pressure at home with the following readings noted: 133/82 123/69 127/84 112/86  She has stopped labetolol 200 mg TID and started metoprolol XL 50 mg daily She began to gradually decrease her hydralazine dose while starting valsartan HCTZ 160-12.5 once daily. She has continued to take once daily amlodipine 5 mg she cannot tolerate 10 mg Feels good with her med changes, she has not had any highs or lows with her blood pressure any side effects or concerns  Metabolic syndrome - previously on victoza with prior PCP, she would like to try to qualify for medications again BMI >50 She has been working on [employer], decreasing portions and calories, and exercising more, weight has unfortunately increased over the past year 12 to 15 pounds Wt Readings from Last 5 Encounters:  10/13/19 (!) 303 lb 11.2 oz (137.8 kg)  09/28/19 300 lb (136.1 kg)  09/19/19 (!) 300 lb 12.8 oz (136.4 kg)  12/21/18 288 lb (130.6 kg)  11/08/18 292 lb 6.4 oz (132.6 kg)   BMI Readings from Last 5 Encounters:  10/13/19 50.54 kg/m  09/28/19 49.92 kg/m  09/19/19 50.06 kg/m  12/21/18 47.93 kg/m  11/08/18 48.66 kg/m       Patient Active Problem List   Diagnosis Date Noted  . Drug-induced polyneuropathy ([place]) 09/20/2019  . At risk for loss of bone density 09/20/2019  . LVH (left ventricular hypertrophy) due to hypertensive disease, without heart  failure 09/19/2019  . H/O cardiomegaly 09/19/2019  . Metabolic syndrome 30/86/5784  . S/P hysterectomy with oophorectomy 12/30/2018  . Lymphedema of right arm 12/21/2018  . GERD without esophagitis 12/21/2018  . Malignant neoplasm of upper-outer quadrant of right breast in female, estrogen receptor positive ([place]) 12/07/2017  . Family history of breast cancer 10/13/2016  . Dysplasia of cervix, low grade (CIN 1) 10/13/2016  . Thrombocytosis ([place]) 03/16/2016  . Hemoglobin C trait ([place]) 05/08/2015  . Vitamin D deficiency 02/21/2015  . Morbid obesity with BMI of 50.0-59.9, adult ([place])   . Hyperlipidemia   . Hypertension   . IFG (impaired fasting glucose)   . Migraines       Current Outpatient Medications:  .  amLODipine (NORVASC) 5 MG tablet, Take 1 tablet (5 mg total) by mouth daily., Disp: 90 tablet, Rfl: 3 .  anastrozole (ARIMIDEX) 1 MG tablet, Take 1 tablet (1 mg total) by mouth daily., Disp: 30 tablet, Rfl: 11 .  Blood Pressure Monitoring (BLOOD PRESSURE MONITOR/L CUFF) MISC, Large cuff; check BP; fluctuating blood pressures; HTN; LON 99 months, Disp: 1 each, Rfl: 0 .  calcium carbonate (TUMS - DOSED IN MG ELEMENTAL CALCIUM) 500 MG chewable tablet, Chew 1 tablet by mouth daily., Disp: , Rfl:  .  Cholecalciferol (VITAMIN D) 2000 UNITS CAPS, Take 2,000 Units by mouth daily., Disp: , Rfl:  .  DULoxetine (CYMBALTA) 30 MG capsule, Take 30 mg by mouth daily., Disp: ,  Rfl:  .  fluticasone (FLONASE) 50 MCG/ACT nasal spray, SHAKE LIQUID AND USE 2 SPRAYS IN EACH NOSTRIL DAILY AS NEEDED FOR ALLERGIES, Disp: 48 g, Rfl: 0 .  gabapentin (NEURONTIN) 100 MG capsule, , Disp: , Rfl:  .  gabapentin (NEURONTIN) 300 MG capsule, Take 300 mg by mouth 3 (three) times daily., Disp: , Rfl:  .  Insulin Pen Needle (NOVOFINE) 32G X 6 MM MISC, 1 each by Does not apply route daily., Disp: 100 each, Rfl: 1 .  loratadine (CLARITIN) 10 MG tablet, Take 10 mg by mouth daily., Disp: , Rfl:  .  Magnesium 250 MG TABS,  Take by mouth., Disp: , Rfl:  .  metoprolol succinate (TOPROL XL) 50 MG 24 hr tablet, Take 1 tablet (50 mg total) by mouth daily. Take with or immediately following a meal., Disp: 30 tablet, Rfl: 2 .  Olopatadine HCl 0.2 % SOLN, One drop in each eye daily, Disp: 2.5 mL, Rfl: 11 .  valsartan-hydrochlorothiazide (DIOVAN HCT) 160-12.5 MG tablet, Take 1 tablet by mouth daily., Disp: 30 tablet, Rfl: 2 .  vitamin B-12 (CYANOCOBALAMIN) 100 MCG tablet, Take 100 mcg by mouth daily., Disp: , Rfl:  .  Vitamin D, Ergocalciferol, (DRISDOL) 1.25 MG (50000 UNIT) CAPS capsule, Take 1 capsule (50,000 Units total) by mouth every 7 (seven) days., Disp: 24 capsule, Rfl: 0   Allergies  Allergen Reactions  . Chlorthalidone Other (See Comments)    Severe hypokalemia     Family History  Problem Relation Age of Onset  . Diabetes Mother   . Hypertension Mother   . Thyroid disease Mother   . Breast cancer Maternal Grandmother        ?age  . Diabetes Maternal Grandfather   . Hypertension Maternal Grandfather   . Seizures Maternal Grandfather   . Breast cancer Maternal Aunt        early 74's  . Diabetes Sister   . Diabetes Paternal Grandmother   . Heart disease Neg Hx   . Stroke Neg Hx   . COPD Neg Hx   . Ovarian cancer Neg Hx      Social History   Socioeconomic History  . Marital status: Single    Spouse name: Not on file  . Number of children: Not on file  . Years of education: Not on file  . Highest education level: Not on file  Occupational History  . Not on file  Tobacco Use  . Smoking status: Never Smoker  . Smokeless tobacco: Never Used  Substance and Sexual Activity  . Alcohol use: No  . Drug use: No  . Sexual activity: Not Currently    Birth control/protection: None  Other Topics Concern  . Not on file  Social History Narrative  . Not on file   Social Determinants of Health   Financial Resource Strain:   . Difficulty of Paying Living Expenses:   Food Insecurity:   .  Worried About Charity fundraiser in the Last Year:   . Arboriculturist in the Last Year:   Transportation Needs:   . Film/video editor (Medical):   Marland Kitchen Lack of Transportation (Non-Medical):   Physical Activity:   . Days of Exercise per Week:   . Minutes of Exercise per Session:   Stress:   . Feeling of Stress :   Social Connections:   . Frequency of Communication with Friends and Family:   . Frequency of Social Gatherings with Friends and Family:   .  Attends Religious Services:   . Active Member of Clubs or Organizations:   . Attends Archivist Meetings:   Marland Kitchen Marital Status:   Intimate Partner Violence:   . Fear of Current or Ex-Partner:   . Emotionally Abused:   Marland Kitchen Physically Abused:   . Sexually Abused:     Chart Review Today: I personally reviewed active problem list, medication list, allergies, family history, social history, health maintenance, notes from last encounter, lab results, imaging with the patient/caregiver today.   Review of Systems 10 Systems reviewed and are negative for acute change except as noted in the HPI.     Objective:   Vitals:   10/13/19 0850  BP: 126/84  Pulse: 97  Resp: 14  Temp: 97.8 F (36.6 C)  SpO2: 95%  Weight: (!) 303 lb 11.2 oz (137.8 kg)  Height: 5\' 5"  (1.651 m)    Body mass index is 50.54 kg/m.  Physical Exam Vitals and nursing note reviewed.  Constitutional:      General: She is not in acute distress.    Appearance: Normal appearance. She is obese. She is not ill-appearing, toxic-appearing or diaphoretic.  HENT:     Head: Normocephalic and atraumatic.     Right Ear: External ear normal.     Left Ear: External ear normal.  Eyes:     General:        Right eye: No discharge.     Conjunctiva/sclera: Conjunctivae normal.  Cardiovascular:     Rate and Rhythm: Normal rate and regular rhythm.     Pulses: Normal pulses.     Heart sounds: Normal heart sounds.  Pulmonary:     Effort: Pulmonary effort is normal.      Breath sounds: Normal breath sounds.  Abdominal:     Comments: Obese abdomen soft nontender, normal bowel sounds x4  Musculoskeletal:     Right lower leg: No edema.     Left lower leg: No edema.  Skin:    General: Skin is warm and dry.     Coloration: Skin is not jaundiced or pale.  Neurological:     Mental Status: She is alert. Mental status is at baseline.  Psychiatric:        Mood and Affect: Mood normal.        Behavior: Behavior normal.         Results for orders placed or performed in visit on 09/19/19  CBC with Differential/Platelet  Result Value Ref Range   WBC 4.0 3.8 - 10.8 Thousand/uL   RBC 4.07 3.80 - 5.10 Million/uL   Hemoglobin 11.2 (L) 11.7 - 15.5 g/dL   HCT 33.3 (L) 35.0 - 45.0 %   MCV 81.8 80.0 - 100.0 fL   MCH 27.5 27.0 - 33.0 pg   MCHC 33.6 32.0 - 36.0 g/dL   RDW 15.0 11.0 - 15.0 %   Platelets 448 (H) 140 - 400 Thousand/uL   MPV 9.1 7.5 - 12.5 fL   Neutro Abs 2,508 1,500 - 7,800 cells/uL   Lymphs Abs 920 850 - 3,900 cells/uL   Absolute Monocytes 376 200 - 950 cells/uL   Eosinophils Absolute 148 15 - 500 cells/uL   Basophils Absolute 48 0 - 200 cells/uL   Neutrophils Relative % 62.7 %   Total Lymphocyte 23.0 %   Monocytes Relative 9.4 %   Eosinophils Relative 3.7 %   Basophils Relative 1.2 %  Lipid panel  Result Value Ref Range   Cholesterol 211 (H) <200 mg/dL  HDL 51 > OR = 50 mg/dL   Triglycerides 92 <150 mg/dL   LDL Cholesterol (Calc) 140 (H) mg/dL (calc)   Total CHOL/HDL Ratio 4.1 <5.0 (calc)   Non-HDL Cholesterol (Calc) 160 (H) <130 mg/dL (calc)  COMPLETE METABOLIC PANEL WITH GFR  Result Value Ref Range   Glucose, Bld 88 65 - 99 mg/dL   BUN 13 7 - 25 mg/dL   Creat 0.88 0.50 - 1.10 mg/dL   GFR, Est Non African American 85 > OR = 60 mL/min/1.40m2   GFR, Est African American 99 > OR = 60 mL/min/1.32m2   BUN/Creatinine Ratio NOT APPLICABLE 6 - 22 (calc)   Sodium 142 135 - 146 mmol/L   Potassium 4.1 3.5 - 5.3 mmol/L   Chloride 107  98 - 110 mmol/L   CO2 27 20 - 32 mmol/L   Calcium 9.7 8.6 - 10.2 mg/dL   Total Protein 7.0 6.1 - 8.1 g/dL   Albumin 4.4 3.6 - 5.1 g/dL   Globulin 2.6 1.9 - 3.7 g/dL (calc)   AG Ratio 1.7 1.0 - 2.5 (calc)   Total Bilirubin 0.3 0.2 - 1.2 mg/dL   Alkaline phosphatase (APISO) 78 31 - 125 U/L   AST 14 10 - 30 U/L   ALT 14 6 - 29 U/L  Hemoglobin A1c  Result Value Ref Range   Hgb A1c MFr Bld 5.2 <5.7 % of total Hgb   Mean Plasma Glucose 103 (calc)   eAG (mmol/L) 5.7 (calc)        Assessment & Plan:      ICD-10-CM   1. Essential hypertension  I10 metoprolol succinate (TOPROL XL) 50 MG 24 hr tablet    valsartan-hydrochlorothiazide (DIOVAN HCT) 160-12.5 MG tablet   Blood pressure well controlled, stable, doing well with med changes continue Toprol-XL Diovan and Norvasc, continue to monitor at home  2. Morbid obesity with BMI of 50.0-59.9, adult (HCC)  E66.01 liraglutide (VICTOZA) 18 MG/3ML SOPN   Z68.43    Patient would like to restart Victoza for metabolic syndrome and morbid obesity with recent increasing weight  3. Metabolic syndrome  F35.45 liraglutide (VICTOZA) 18 MG/3ML SOPN   BMI greater than 50, hyperlipidemia, increased waist circumference  4. Mixed hyperlipidemia  E78.2 liraglutide (VICTOZA) 18 MG/3ML SOPN   Last cholesterol panel reviewed, LDL 140, patient encouraged to continue to work on diet and exercise  5. IFG (impaired fasting glucose)  R73.01    Continue monitoring, last A1c was normal     If unable to get Victoza for the patient again may need to refer her to medical weight management, did encourage her to continue to work on diet, exercise, calorie reduction, other healthy habits to promote weight loss and improved health.  Did encourage patient to continue to reduce hydralazine while continuing to take metoprolol XL 50 mg once daily, valsartan-HCTZ once daily and Norvasc 5 mg once daily, once off of hydralazine completely patient agrees to send in more of her home  blood pressure readings that we may be able to see how her blood pressure is controlled a few weeks from now.  Did encourage her to get a follow-up visit in office if she has any side effects, concerns, blood pressure is too high or too low. We will need to do a follow-up in about 3 months for hypertension  If she is able to get Victoza we will need a 1 month follow-up to monitor her and document her diet exercise efforts, her weight and ensure that  she has no side effects with medication.   Delsa Grana, PA-C 10/13/19 8:59 AM

## 2019-10-18 ENCOUNTER — Other Ambulatory Visit: Payer: Self-pay | Admitting: Family Medicine

## 2019-10-20 ENCOUNTER — Encounter: Payer: Self-pay | Admitting: Family Medicine

## 2019-11-11 NOTE — Progress Notes (Signed)
Chief Complaint  Patient presents with  . Gynecologic Exam     HPI:      [name] is a 34 y.o. G1P1001 who LMP was Patient's last menstrual period was 02/08/2018 (exact date)., presents today for her annual examination. Her menses are absent since 10/19 chemo start and now s/p lap hyst BSO 7/20.  Has vasomotor sx with arimidex.  Sex activity: not sexually active; no vag sx. Last Pap: 10/13/16  Results were: no abnormalities /neg HPV DNA; s/p lap hyst BSO 7/20 Hx of STDs: chlamydia, CIN 1 in 2014  Last mammo: 12/29/18 LT side normal; repeat due in 12 months. Mammo 11/26/17 was Birads-5. Bx with Dr. Bary Castilla showed RT intraductal carcinoma with mets to axillary lymph nodes. Did RT mastectomy and chemo at [facility], now on arimidex for 10 yrs (changed from femara due to knee pain). 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 Bary Castilla; Vistaseq panel testing done 6/20 was negative.   There is a FH of breast cancer in her MGM and mat aunt, genetic testing not done by them. Pt is BRCA neg/Vistaseq panel neg 6/20. There is no FH of ovarian cancer. The patient does do self-breast exams.   Tobacco use: The patient denies current or previous tobacco use. Alcohol use: none No drug use Exercise: moderately active  She does get adequate calcium and Vitamin D in her diet (on Rx).  She has her labs managed with PCP.  DEXA 2021 at [facility]. Osteoporosis in spine, normal hip.   Past Medical History:  Diagnosis Date  . BRCA negative 2019   BRCA with Dr. Bary Castilla, 7/20 Vistaseq panel neg with Mckenzie Lopez Mckenzie Pereda, PA-C  . CHF (congestive heart failure) ([place])    POSTPARTUM 2017/ RESOLVED  . Eczema   . Hemoglobin C trait ([place]) 11/29/2015  . Hyperlipidemia   . Hypertension   . IFG (impaired fasting glucose)   . Intraductal carcinoma of right breast 11/2017   ER/PR+; Her2neu neg; mets to LN  . Low back pain   . Migraines   . Morbid obesity ([place])     Past Surgical History:  Procedure  Laterality Date  . ABDOMINAL HYSTERECTOMY  11/28/2018   Full  . AXILLARY LYMPH NODE BIOPSY Right 12/01/2017   METASTATIC MAMMARY CARCINOMA  . AXILLARY LYMPH NODE BIOPSY Left 12/01/2017   NEGATIVE FOR MALIGNANCY  . BREAST BIOPSY Right 12/01/2017   11 and 12 o'clock, INVASIVE MAMMARY CARCINOMA, ER/PR positive HER2 negative  . MASTECTOMY Right 01/05/2018    Family History  Problem Relation Age of Onset  . Diabetes Mother   . Hypertension Mother   . Thyroid disease Mother   . Breast cancer Maternal Grandmother        ?age  . Diabetes Maternal Grandfather   . Hypertension Maternal Grandfather   . Seizures Maternal Grandfather   . Breast cancer Maternal Aunt        early 56's  . Diabetes Sister   . Diabetes Paternal Grandmother   . Heart disease Neg Hx   . Stroke Neg Hx   . COPD Neg Hx   . Ovarian cancer Neg Hx     Social History   Socioeconomic History  . Marital status: Single    Spouse name: Not on file  . Number of children: Not on file  . Years of education: Not on file  . Highest education level: Not on file  Occupational History  . Not on file  Tobacco Use  . Smoking  status: Never Smoker  . Smokeless tobacco: Never Used  Vaping Use  . Vaping Use: Never used  Substance and Sexual Activity  . Alcohol use: No  . Drug use: No  . Sexual activity: Not Currently    Birth control/protection: None  Other Topics Concern  . Not on file  Social History Narrative  . Not on file   Social Determinants of Health   Financial Resource Strain:   . Difficulty of Paying Living Expenses:   Food Insecurity:   . Worried About Charity fundraiser in the Last Year:   . Arboriculturist in the Last Year:   Transportation Needs:   . Film/video editor (Medical):   Marland Kitchen Lack of Transportation (Non-Medical):   Physical Activity:   . Days of Exercise per Week:   . Minutes of Exercise per Session:   Stress:   . Feeling of Stress :   Social Connections:   . Frequency of  Communication with Friends and Family:   . Frequency of Social Gatherings with Friends and Family:   . Attends Religious Services:   . Active Member of Clubs or Organizations:   . Attends Archivist Meetings:   Marland Kitchen Marital Status:   Intimate Partner Violence:   . Fear of Current or Ex-Partner:   . Emotionally Abused:   Marland Kitchen Physically Abused:   . Sexually Abused:     Current Outpatient Medications on File Prior to Visit  Medication Sig Dispense Refill  . amLODipine (NORVASC) 5 MG tablet Take 1 tablet (5 mg total) by mouth daily. 90 tablet 3  . anastrozole (ARIMIDEX) 1 MG tablet Take 1 tablet (1 mg total) by mouth daily. 30 tablet 11  . Blood Pressure Monitoring (BLOOD PRESSURE MONITOR/L CUFF) MISC Large cuff; check BP; fluctuating blood pressures; HTN; LON 99 months 1 each 0  . calcium carbonate (TUMS - DOSED IN MG ELEMENTAL CALCIUM) 500 MG chewable tablet Chew 1 tablet by mouth daily.    . Cholecalciferol (VITAMIN D) 2000 UNITS CAPS Take 2,000 Units by mouth daily.    . DULoxetine (CYMBALTA) 30 MG capsule Take 30 mg by mouth daily.    . fluticasone (FLONASE) 50 MCG/ACT nasal spray SHAKE LIQUID AND USE 2 SPRAYS IN EACH NOSTRIL DAILY AS NEEDED FOR ALLERGIES 48 g 3  . gabapentin (NEURONTIN) 300 MG capsule Take 300 mg by mouth 3 (three) times daily.    . Insulin Pen Needle (NOVOFINE) 32G X 6 MM MISC 1 each by Does not apply route daily. 100 each 1  . loratadine (CLARITIN) 10 MG tablet Take 10 mg by mouth daily.    . Magnesium 250 MG TABS Take by mouth.    . metoprolol succinate (TOPROL XL) 50 MG 24 hr tablet Take 1 tablet (50 mg total) by mouth daily. Take with or immediately following a meal. 90 tablet 3  . Olopatadine HCl 0.2 % SOLN One drop in each eye daily 2.5 mL 11  . valsartan-hydrochlorothiazide (DIOVAN HCT) 160-12.5 MG tablet Take 1 tablet by mouth daily. 90 tablet 3  . vitamin B-12 (CYANOCOBALAMIN) 100 MCG tablet Take 100 mcg by mouth daily.    . Vitamin D, Ergocalciferol,  (DRISDOL) 1.25 MG (50000 UNIT) CAPS capsule Take 1 capsule (50,000 Units total) by mouth every 7 (seven) days. 24 capsule 0  . liraglutide (VICTOZA) 18 MG/3ML SOPN Inject 0.1 mLs (0.6 mg total) into the skin daily for 7 days, THEN 0.2 mLs (1.2 mg total) daily for 7  days. 9 mL 3   No current facility-administered medications on file prior to visit.    ROS:  Review of Systems  Constitutional: Negative for fatigue, fever and unexpected weight change.  Respiratory: Negative for cough, shortness of breath and wheezing.   Cardiovascular: Negative for chest pain, palpitations and leg swelling.  Gastrointestinal: Negative for blood in stool, constipation, diarrhea, nausea and vomiting.  Endocrine: Negative for cold intolerance, heat intolerance and polyuria.  Genitourinary: Negative for dyspareunia, dysuria, flank pain, frequency, genital sores, hematuria, menstrual problem, pelvic pain, urgency, vaginal bleeding, vaginal discharge and vaginal pain.  Musculoskeletal: Positive for joint swelling. Negative for back pain and myalgias.  Skin: Negative for rash.  Neurological: Positive for numbness. Negative for dizziness, syncope, light-headedness and headaches.  Hematological: Negative for adenopathy.  Psychiatric/Behavioral: Negative for agitation, confusion, sleep disturbance and suicidal ideas. The patient is not nervous/anxious.      Objective: BP 124/90   Ht '5\' 5"'$  (1.651 m)   Wt (!) 304 lb (137.9 kg)   LMP 02/08/2018 (Exact Date)   BMI 50.59 kg/m    Physical Exam Constitutional:      Appearance: She is well-developed.  Genitourinary:     Vulva, vagina, uterus, right adnexa and left adnexa normal.     No vulval lesion or tenderness noted.     No vaginal discharge, erythema or tenderness.     No cervical motion tenderness or polyp.     Uterus is not enlarged or tender.     No right or left adnexal mass present.     Right adnexa not tender.     Left adnexa not tender.  Neck:      Thyroid: No thyromegaly.  Cardiovascular:     Rate and Rhythm: Normal rate and regular rhythm.     Heart sounds: Normal heart sounds. No murmur heard.   Pulmonary:     Effort: Pulmonary effort is normal.     Breath sounds: Normal breath sounds.  Chest:     Breasts:        Right: Absent. No skin change or tenderness.        Left: No mass, nipple discharge, skin change or tenderness.     Comments: RT BREAST ABSENT Abdominal:     Palpations: Abdomen is soft.     Tenderness: There is no abdominal tenderness. There is no guarding.  Musculoskeletal:        General: Normal range of motion.     Cervical back: Normal range of motion.  Neurological:     General: No focal deficit present.     Mental Status: She is alert and oriented to person, place, and time.     Cranial Nerves: No cranial nerve deficit.  Skin:    General: Skin is warm and dry.  Psychiatric:        Mood and Affect: Mood normal.        Behavior: Behavior normal.        Thought Content: Thought content normal.        Judgment: Judgment normal.  Vitals reviewed.     Assessment/Plan: Encounter for annual routine gynecological examination  Malignant neoplasm of upper-outer quadrant of right breast in female, estrogen receptor positive (HCC)--managed at [facility]. Doing arimidex for 10 yrs. Doing well.   Premature surgical menopause--cont ca/Vit D/exercise. Current on DEXA.    GYN counsel breast self exam, mammography screening, adequate intake of calcium and vitamin D, diet and exercise     F/U  Return in  about 1 year (around 11/13/2020).  Mckenzie Mikkelson B. Asiana Benninger, PA-C 11/14/2019 10:32 AM

## 2019-11-14 ENCOUNTER — Encounter: Payer: Self-pay | Admitting: Obstetrics and Gynecology

## 2019-11-14 ENCOUNTER — Ambulatory Visit (INDEPENDENT_AMBULATORY_CARE_PROVIDER_SITE_OTHER): Payer: Medicaid Other | Admitting: Obstetrics and Gynecology

## 2019-11-14 ENCOUNTER — Other Ambulatory Visit: Payer: Self-pay

## 2019-11-14 VITALS — BP 124/90 | Ht 65.0 in | Wt 304.0 lb

## 2019-11-14 DIAGNOSIS — Z170 Estrogen receptor positive status [ER+]: Secondary | ICD-10-CM | POA: Diagnosis not present

## 2019-11-14 DIAGNOSIS — C50411 Malignant neoplasm of upper-outer quadrant of right female breast: Secondary | ICD-10-CM

## 2019-11-14 DIAGNOSIS — E8940 Asymptomatic postprocedural ovarian failure: Secondary | ICD-10-CM

## 2019-11-14 DIAGNOSIS — Z01419 Encounter for gynecological examination (general) (routine) without abnormal findings: Secondary | ICD-10-CM | POA: Diagnosis not present

## 2019-11-14 NOTE — Patient Instructions (Signed)
I value your feedback and entrusting us with your care. If you get a Mckenzie Lopez patient survey, I would appreciate you taking the time to let us know about your experience today. Thank you!  As of April 20, 2019, your lab results will be released to your MyChart immediately, before I even have a chance to see them. Please give me time to review them and contact you if there are any abnormalities. Thank you for your patience.  

## 2019-12-05 ENCOUNTER — Encounter: Payer: Self-pay | Admitting: Occupational Therapy

## 2019-12-20 ENCOUNTER — Encounter: Payer: Self-pay | Admitting: Family Medicine

## 2019-12-20 ENCOUNTER — Other Ambulatory Visit: Payer: Self-pay

## 2019-12-20 ENCOUNTER — Ambulatory Visit: Payer: Medicaid Other | Admitting: Family Medicine

## 2019-12-20 VITALS — BP 124/88 | HR 100 | Temp 98.9°F | Resp 18 | Ht 65.0 in | Wt 305.5 lb

## 2019-12-20 DIAGNOSIS — E8881 Metabolic syndrome: Secondary | ICD-10-CM | POA: Diagnosis not present

## 2019-12-20 DIAGNOSIS — R7301 Impaired fasting glucose: Secondary | ICD-10-CM

## 2019-12-20 DIAGNOSIS — Z6841 Body Mass Index (BMI) 40.0 and over, adult: Secondary | ICD-10-CM

## 2019-12-20 DIAGNOSIS — I10 Essential (primary) hypertension: Secondary | ICD-10-CM | POA: Diagnosis not present

## 2019-12-20 MED ORDER — NOVOFINE 32G X 6 MM MISC: 1.0000 | Freq: Every day | 3 refills | Status: DC

## 2019-12-20 NOTE — Progress Notes (Signed)
Name: Mckenzie Lopez   MRN: [id number]    DOB: Dec 21, 1985   Date:12/20/2019       Progress Note  Chief Complaint  Patient presents with  . Follow-up  . Hypertension    139/80 highest and lowest 106/67     Subjective:   Mckenzie Lopez is a 34 y.o. female, presents to clinic for F/up on HTN and weight/victoza  Hypertension:  Currently managed on amlodipine 5 mg (cannot tolerate 10 mg), valsartan HCTZ 160-12.5, and metoprolol XL 50 mg Pt reports good med compliance and denies any SE.  No lightheadedness, hypotension, syncope. Blood pressure today is well controlled. At home BP has ranged 139/80 to 103/67 BP Readings from Last 3 Encounters:  12/20/19 124/88  11/14/19 124/90  10/13/19 126/84   Pt denies CP, SOB, exertional sx, LE edema, palpitation, Ha's, visual disturbances Dietary efforts for BP? Dieting, eating less, trying to exercise more  Morbid obesity: Tried to get pt back on victoza for metabolic syndrome and morbid obesity -  She was able to get in May, currently taking 1.2 mg daily SQ dose, it has curbed appetite, she feels full longer.  She has been trying to eat more chicken vegetables and fruit she is full easily and for longer periods of time she is try to drink ample fluids, exercising 15 minutes walking activity then takes a break and does another 15 minutes of walking So far no decrease in her weight but she is feeling that her body is changing she is fitting in close and feels little bit more slim down Wt Readings from Last 5 Encounters:  12/20/19 (!) 305 lb 8 oz (138.6 kg)  11/14/19 (!) 304 lb (137.9 kg)  10/13/19 (!) 303 lb 11.2 oz (137.8 kg)  09/28/19 300 lb (136.1 kg)  09/19/19 (!) 300 lb 12.8 oz (136.4 kg)   BMI Readings from Last 5 Encounters:  12/20/19 50.84 kg/m  11/14/19 50.59 kg/m  10/13/19 50.54 kg/m  09/28/19 49.92 kg/m  09/19/19 50.06 kg/m        Current Outpatient Medications:  .  amLODipine (NORVASC) 5 MG tablet,  Take 1 tablet (5 mg total) by mouth daily., Disp: 90 tablet, Rfl: 3 .  anastrozole (ARIMIDEX) 1 MG tablet, Take 1 tablet (1 mg total) by mouth daily., Disp: 30 tablet, Rfl: 11 .  calcium carbonate (TUMS - DOSED IN MG ELEMENTAL CALCIUM) 500 MG chewable tablet, Chew 1 tablet by mouth daily., Disp: , Rfl:  .  Cholecalciferol (VITAMIN D) 2000 UNITS CAPS, Take 2,000 Units by mouth daily., Disp: , Rfl:  .  DULoxetine (CYMBALTA) 30 MG capsule, Take 30 mg by mouth daily., Disp: , Rfl:  .  fluticasone (FLONASE) 50 MCG/ACT nasal spray, SHAKE LIQUID AND USE 2 SPRAYS IN EACH NOSTRIL DAILY AS NEEDED FOR ALLERGIES, Disp: 48 g, Rfl: 3 .  gabapentin (NEURONTIN) 300 MG capsule, Take 300 mg by mouth 3 (three) times daily., Disp: , Rfl:  .  liraglutide (VICTOZA) 18 MG/3ML SOPN, Inject 0.1 mLs (0.6 mg total) into the skin daily for 7 days, THEN 0.2 mLs (1.2 mg total) daily for 7 days., Disp: 9 mL, Rfl: 3 .  loratadine (CLARITIN) 10 MG tablet, Take 10 mg by mouth daily., Disp: , Rfl:  .  Magnesium 250 MG TABS, Take by mouth., Disp: , Rfl:  .  metoprolol succinate (TOPROL XL) 50 MG 24 hr tablet, Take 1 tablet (50 mg total) by mouth daily. Take with or immediately following a meal., Disp:  90 tablet, Rfl: 3 .  Olopatadine HCl 0.2 % SOLN, One drop in each eye daily, Disp: 2.5 mL, Rfl: 11 .  valsartan-hydrochlorothiazide (DIOVAN HCT) 160-12.5 MG tablet, Take 1 tablet by mouth daily., Disp: 90 tablet, Rfl: 3 .  vitamin B-12 (CYANOCOBALAMIN) 100 MCG tablet, Take 100 mcg by mouth daily., Disp: , Rfl:  .  Vitamin D, Ergocalciferol, (DRISDOL) 1.25 MG (50000 UNIT) CAPS capsule, Take 1 capsule (50,000 Units total) by mouth every 7 (seven) days., Disp: 24 capsule, Rfl: 0 .  Blood Pressure Monitoring (BLOOD PRESSURE MONITOR/L CUFF) MISC, Large cuff; check BP; fluctuating blood pressures; HTN; LON 99 months (Patient not taking: Reported on 12/20/2019), Disp: 1 each, Rfl: 0 .  Insulin Pen Needle (NOVOFINE) 32G X 6 MM MISC, 1 each by Does  not apply route daily. (Patient not taking: Reported on 12/20/2019), Disp: 100 each, Rfl: 1  Patient Active Problem List   Diagnosis Date Noted  . Drug-induced polyneuropathy ([place]) 09/20/2019  . At risk for loss of bone density 09/20/2019  . LVH (left ventricular hypertrophy) due to hypertensive disease, without heart failure 09/19/2019  . H/O cardiomegaly 09/19/2019  . Metabolic syndrome 10/93/2355  . S/P hysterectomy with oophorectomy 12/30/2018  . Lymphedema of right arm 12/21/2018  . GERD without esophagitis 12/21/2018  . Malignant neoplasm of upper-outer quadrant of right breast in female, estrogen receptor positive ([place]) 12/07/2017  . Family history of breast cancer 10/13/2016  . Dysplasia of cervix, low grade (CIN 1) 10/13/2016  . Thrombocytosis ([place]) 03/16/2016  . Hemoglobin C trait ([place]) 05/08/2015  . Vitamin D deficiency 02/21/2015  . Morbid obesity with BMI of 50.0-59.9, adult ([place])   . Hyperlipidemia   . Hypertension   . IFG (impaired fasting glucose)   . Migraines     Past Surgical History:  Procedure Laterality Date  . ABDOMINAL HYSTERECTOMY  11/28/2018   Full  . AXILLARY LYMPH NODE BIOPSY Right 12/01/2017   METASTATIC MAMMARY CARCINOMA  . AXILLARY LYMPH NODE BIOPSY Left 12/01/2017   NEGATIVE FOR MALIGNANCY  . BREAST BIOPSY Right 12/01/2017   11 and 12 o'clock, INVASIVE MAMMARY CARCINOMA, ER/PR positive HER2 negative  . MASTECTOMY Right 01/05/2018    Family History  Problem Relation Age of Onset  . Diabetes Mother   . Hypertension Mother   . Thyroid disease Mother   . Breast cancer Maternal Grandmother        ?age  . Diabetes Maternal Grandfather   . Hypertension Maternal Grandfather   . Seizures Maternal Grandfather   . Breast cancer Maternal Aunt        early 14's  . Diabetes Sister   . Diabetes Paternal Grandmother   . Heart disease Neg Hx   . Stroke Neg Hx   . COPD Neg Hx   . Ovarian cancer Neg Hx     Social History   Tobacco Use  .  Smoking status: Never Smoker  . Smokeless tobacco: Never Used  Vaping Use  . Vaping Use: Never used  Substance Use Topics  . Alcohol use: No  . Drug use: No     Allergies  Allergen Reactions  . Chlorthalidone Other (See Comments)    Severe hypokalemia    Health Maintenance  Topic Date Due  . Hepatitis C Screening  Never done  . INFLUENZA VACCINE  12/10/2019  . TETANUS/TDAP  09/17/2025  . COVID-19 Vaccine  Completed  . HIV Screening  Completed  . PAP SMEAR-Modifier  Discontinued    Chart Review Today:  I personally reviewed active problem list, medication list, allergies, family history, social history, health maintenance, notes from last encounter, lab results, imaging with the patient/caregiver today.   Review of Systems  10 Systems reviewed and are negative for acute change except as noted in the HPI.  Objective:   Vitals:   12/20/19 1024  BP: 124/88  Pulse: 100  Resp: 18  Temp: 98.9 F (37.2 C)  TempSrc: Oral  SpO2: 97%  Weight: (!) 305 lb 8 oz (138.6 kg)  Height: _0  (1.651 m)    Body mass index is 50.84 kg/m.  Physical Exam Vitals and nursing note reviewed.  Constitutional:      General: She is not in acute distress.    Appearance: Normal appearance. She is obese. She is not ill-appearing, toxic-appearing or diaphoretic.  Cardiovascular:     Rate and Rhythm: Normal rate and regular rhythm.     Pulses: Normal pulses.     Heart sounds: Normal heart sounds. No murmur heard.  No friction rub. No gallop.   Pulmonary:     Effort: Pulmonary effort is normal. No respiratory distress.     Breath sounds: Normal breath sounds. No wheezing.  Abdominal:     General: Bowel sounds are normal.     Palpations: Abdomen is soft.  Musculoskeletal:     Right lower leg: No edema.     Left lower leg: No edema.  Neurological:     Mental Status: She is alert. Mental status is at baseline.  Psychiatric:        Mood and Affect: Mood normal.        Behavior:  Behavior normal.         Assessment & Plan:     ICD-10-CM   1. Essential hypertension  I10    Bp stable and at goal with med changes, continue norvasc, metoprolol, valsartan-HCTZ, no SE or concerns, labs at next visit  2. IFG (impaired fasting glucose)  R73.01 Insulin Pen Needle (NOVOFINE) 32G X 6 MM MISC  3. Metabolic syndrome  B04.88    Continue diet lifestyle efforts and Victoza  4. Morbid obesity with BMI of 50.0-59.9, adult ([place])  E66.01    Z68.43    No weight loss yet but she is feeling some change in her body and clothing encouraged continued diet efforts and increased exercise     Return in about 3 months (around 03/21/2020) for Routine follow-up weight/HTN labs .   Delsa Grana, PA-C 12/20/19 10:47 AM

## 2020-01-01 DIAGNOSIS — Z9011 Acquired absence of right breast and nipple: Secondary | ICD-10-CM | POA: Diagnosis not present

## 2020-01-01 DIAGNOSIS — Z853 Personal history of malignant neoplasm of breast: Secondary | ICD-10-CM | POA: Diagnosis not present

## 2020-01-01 DIAGNOSIS — C50811 Malignant neoplasm of overlapping sites of right female breast: Secondary | ICD-10-CM | POA: Diagnosis not present

## 2020-01-01 DIAGNOSIS — Z170 Estrogen receptor positive status [ER+]: Secondary | ICD-10-CM | POA: Diagnosis not present

## 2020-01-03 ENCOUNTER — Other Ambulatory Visit: Payer: Self-pay | Admitting: Family Medicine

## 2020-01-03 DIAGNOSIS — E782 Mixed hyperlipidemia: Secondary | ICD-10-CM

## 2020-01-03 DIAGNOSIS — E8881 Metabolic syndrome: Secondary | ICD-10-CM

## 2020-02-02 DIAGNOSIS — T451X5A Adverse effect of antineoplastic and immunosuppressive drugs, initial encounter: Secondary | ICD-10-CM | POA: Diagnosis not present

## 2020-02-02 DIAGNOSIS — G620 Drug-induced polyneuropathy: Secondary | ICD-10-CM | POA: Diagnosis not present

## 2020-03-08 DIAGNOSIS — Z170 Estrogen receptor positive status [ER+]: Secondary | ICD-10-CM | POA: Diagnosis not present

## 2020-03-08 DIAGNOSIS — C50811 Malignant neoplasm of overlapping sites of right female breast: Secondary | ICD-10-CM | POA: Diagnosis not present

## 2020-03-21 ENCOUNTER — Other Ambulatory Visit: Payer: Self-pay

## 2020-03-21 ENCOUNTER — Ambulatory Visit: Payer: Medicaid Other | Admitting: Family Medicine

## 2020-03-21 ENCOUNTER — Encounter: Payer: Self-pay | Admitting: Family Medicine

## 2020-03-21 VITALS — BP 126/78 | HR 100 | Temp 98.5°F | Resp 16 | Ht 65.0 in | Wt 314.3 lb

## 2020-03-21 DIAGNOSIS — D649 Anemia, unspecified: Secondary | ICD-10-CM | POA: Diagnosis not present

## 2020-03-21 DIAGNOSIS — C50411 Malignant neoplasm of upper-outer quadrant of right female breast: Secondary | ICD-10-CM | POA: Diagnosis not present

## 2020-03-21 DIAGNOSIS — G620 Drug-induced polyneuropathy: Secondary | ICD-10-CM | POA: Diagnosis not present

## 2020-03-21 DIAGNOSIS — D473 Essential (hemorrhagic) thrombocythemia: Secondary | ICD-10-CM | POA: Diagnosis not present

## 2020-03-21 DIAGNOSIS — Z6841 Body Mass Index (BMI) 40.0 and over, adult: Secondary | ICD-10-CM | POA: Diagnosis not present

## 2020-03-21 DIAGNOSIS — I890 Lymphedema, not elsewhere classified: Secondary | ICD-10-CM

## 2020-03-21 DIAGNOSIS — I10 Essential (primary) hypertension: Secondary | ICD-10-CM | POA: Diagnosis not present

## 2020-03-21 DIAGNOSIS — Z170 Estrogen receptor positive status [ER+]: Secondary | ICD-10-CM

## 2020-03-21 DIAGNOSIS — Z5181 Encounter for therapeutic drug level monitoring: Secondary | ICD-10-CM | POA: Diagnosis not present

## 2020-03-21 DIAGNOSIS — R7301 Impaired fasting glucose: Secondary | ICD-10-CM

## 2020-03-21 DIAGNOSIS — E782 Mixed hyperlipidemia: Secondary | ICD-10-CM

## 2020-03-21 DIAGNOSIS — E8881 Metabolic syndrome: Secondary | ICD-10-CM | POA: Diagnosis not present

## 2020-03-21 DIAGNOSIS — K219 Gastro-esophageal reflux disease without esophagitis: Secondary | ICD-10-CM

## 2020-03-21 NOTE — Progress Notes (Signed)
Name: Mckenzie Lopez   MRN: [id number]    DOB: 1986-02-18   Date:03/21/2020       Progress Note  Chief Complaint  Patient presents with  . Follow-up    blood pressure check     Subjective:   Mckenzie Lopez is a 34 y.o. female, presents to clinic for  HTN f/up Hypertension:  Currently managed on Toprol-XL 50 mg, amlodipine 5 mg, valsartan-HCTZ 160-12.5 mg Pt reports good med compliance and denies any SE.   Blood pressure today is well controlled. BP Readings from Last 3 Encounters:  03/21/20 126/78  12/20/19 124/88  11/14/19 124/90   Pt denies CP, SOB, exertional sx, palpitation, Ha's, visual disturbances, lightheadedness, hypotension, syncope, orthopnea, PND In the last 3 months she had 1 instance of left pretibial swelling that was brief and resolved the next day  Morbid obesity: On victoza for morbid obesity, prediabetes metabolic syndrome Her prior PCP had prescribed this for her but she was unable to get so we attempted to refill and get it for her.  She started in the last 3 to 6 months she believes she is on the 1.2 mg dose she denies any side effects specifically denies any bloating nausea vomiting abdominal pain No weight loss, she feels better and her clothes for like they are fitting better but her weight is up Wt Readings from Last 5 Encounters:  03/21/20 (!) 314 lb 4.8 oz (142.6 kg)  12/20/19 (!) 305 lb 8 oz (138.6 kg)  11/14/19 (!) 304 lb (137.9 kg)  10/13/19 (!) 303 lb 11.2 oz (137.8 kg)  09/28/19 300 lb (136.1 kg)   BMI Readings from Last 5 Encounters:  03/21/20 52.30 kg/m  12/20/19 50.84 kg/m  11/14/19 50.59 kg/m  10/13/19 50.54 kg/m  09/28/19 49.92 kg/m   Lab Results  Component Value Date   HGBA1C 5.2 09/19/2019   She did her physical a few months ago with GYN at [facility] She follows up with her breast CA specialists and neurologist and there have been no changes   Current Outpatient Medications:  .  amLODipine (NORVASC) 5  MG tablet, Take 1 tablet (5 mg total) by mouth daily., Disp: 90 tablet, Rfl: 3 .  anastrozole (ARIMIDEX) 1 MG tablet, Take 1 tablet (1 mg total) by mouth daily., Disp: 30 tablet, Rfl: 11 .  calcium carbonate (TUMS - DOSED IN MG ELEMENTAL CALCIUM) 500 MG chewable tablet, Chew 1 tablet by mouth daily., Disp: , Rfl:  .  DULOXETINE HCL PO, Take 90 mg by mouth daily. , Disp: , Rfl:  .  fluticasone (FLONASE) 50 MCG/ACT nasal spray, SHAKE LIQUID AND USE 2 SPRAYS IN EACH NOSTRIL DAILY AS NEEDED FOR ALLERGIES, Disp: 48 g, Rfl: 3 .  gabapentin (NEURONTIN) 300 MG capsule, Take 300 mg by mouth 3 (three) times daily., Disp: , Rfl:  .  loratadine (CLARITIN) 10 MG tablet, Take 10 mg by mouth daily., Disp: , Rfl:  .  Magnesium 400 MG CAPS, Take by mouth. , Disp: , Rfl:  .  metoprolol succinate (TOPROL XL) 50 MG 24 hr tablet, Take 1 tablet (50 mg total) by mouth daily. Take with or immediately following a meal., Disp: 90 tablet, Rfl: 3 .  Olopatadine HCl 0.2 % SOLN, One drop in each eye daily, Disp: 2.5 mL, Rfl: 11 .  valsartan-hydrochlorothiazide (DIOVAN HCT) 160-12.5 MG tablet, Take 1 tablet by mouth daily., Disp: 90 tablet, Rfl: 3 .  VICTOZA 18 MG/3ML SOPN, INJECT 0.$RemoveBefore'6MG'YBukspuGMekRj$  UNDER THE SKIN DAILY  FOR 7 DAYS, THEN 1.$RemoveBefo'2MG'ZbEOeQQqrrQ$  DAILY FOR 7 DAYS, Disp: 6 mL, Rfl: 3 .  vitamin B-12 (CYANOCOBALAMIN) 100 MCG tablet, Take 100 mcg by mouth daily., Disp: , Rfl:  .  Vitamin D, Ergocalciferol, (DRISDOL) 1.25 MG (50000 UNIT) CAPS capsule, Take 1 capsule (50,000 Units total) by mouth every 7 (seven) days., Disp: 24 capsule, Rfl: 0 .  Blood Pressure Monitoring (BLOOD PRESSURE MONITOR/L CUFF) MISC, Large cuff; check BP; fluctuating blood pressures; HTN; LON 99 months (Patient not taking: Reported on 12/20/2019), Disp: 1 each, Rfl: 0 .  Cholecalciferol (VITAMIN D) 2000 UNITS CAPS, Take 2,000 Units by mouth daily. (Patient not taking: Reported on 03/21/2020), Disp: , Rfl:  .  Insulin Pen Needle (NOVOFINE) 32G X 6 MM MISC, 1 each by Does not  apply route daily. (Patient not taking: Reported on 03/21/2020), Disp: 100 each, Rfl: 3  Patient Active Problem List   Diagnosis Date Noted  . Drug-induced polyneuropathy ([place]) 09/20/2019  . At risk for loss of bone density 09/20/2019  . LVH (left ventricular hypertrophy) due to hypertensive disease, without heart failure 09/19/2019  . H/O cardiomegaly 09/19/2019  . Metabolic syndrome 27/74/1287  . S/P hysterectomy with oophorectomy 12/30/2018  . Lymphedema of right arm 12/21/2018  . GERD without esophagitis 12/21/2018  . Malignant neoplasm of upper-outer quadrant of right breast in female, estrogen receptor positive ([place]) 12/07/2017  . Family history of breast cancer 10/13/2016  . Dysplasia of cervix, low grade (CIN 1) 10/13/2016  . Thrombocytosis 03/16/2016  . Hemoglobin C trait ([place]) 05/08/2015  . Vitamin D deficiency 02/21/2015  . Morbid obesity with BMI of 50.0-59.9, adult ([place])   . Hyperlipidemia   . Hypertension   . IFG (impaired fasting glucose)   . Migraines     Past Surgical History:  Procedure Laterality Date  . ABDOMINAL HYSTERECTOMY  11/28/2018   Full  . AXILLARY LYMPH NODE BIOPSY Right 12/01/2017   METASTATIC MAMMARY CARCINOMA  . AXILLARY LYMPH NODE BIOPSY Left 12/01/2017   NEGATIVE FOR MALIGNANCY  . BREAST BIOPSY Right 12/01/2017   11 and 12 o'clock, INVASIVE MAMMARY CARCINOMA, ER/PR positive HER2 negative  . MASTECTOMY Right 01/05/2018    Family History  Problem Relation Age of Onset  . Diabetes Mother   . Hypertension Mother   . Thyroid disease Mother   . Breast cancer Maternal Grandmother        ?age  . Diabetes Maternal Grandfather   . Hypertension Maternal Grandfather   . Seizures Maternal Grandfather   . Breast cancer Maternal Aunt        early 47's  . Diabetes Sister   . Diabetes Paternal Grandmother   . Heart disease Neg Hx   . Stroke Neg Hx   . COPD Neg Hx   . Ovarian cancer Neg Hx     Social History   Tobacco Use  . Smoking status:  Never Smoker  . Smokeless tobacco: Never Used  Vaping Use  . Vaping Use: Never used  Substance Use Topics  . Alcohol use: No  . Drug use: No     Allergies  Allergen Reactions  . Chlorthalidone Other (See Comments)    Severe hypokalemia    Health Maintenance  Topic Date Due  . Hepatitis C Screening  Never done  . TETANUS/TDAP  09/17/2025  . INFLUENZA VACCINE  Completed  . COVID-19 Vaccine  Completed  . HIV Screening  Completed  . PAP SMEAR-Modifier  Discontinued    Chart Review Today: I personally reviewed active  problem list, medication list, allergies, family history, social history, health maintenance, notes from last encounter, lab results, imaging with the patient/caregiver today.   Review of Systems  10 Systems reviewed and are negative for acute change except as noted in the HPI.  Objective:   Vitals:   03/21/20 1037  BP: 126/78  Pulse: 100  Resp: 16  Temp: 98.5 F (36.9 C)  TempSrc: Oral  SpO2: 99%  Weight: (!) 314 lb 4.8 oz (142.6 kg)  Height: $Remove'5\' 5"'TteUIZp$  (1.651 m)    Body mass index is 52.3 kg/m.  Physical Exam Vitals and nursing note reviewed.  Constitutional:      General: She is not in acute distress.    Appearance: Normal appearance. She is well-developed and well-groomed. She is not ill-appearing, toxic-appearing or diaphoretic.     Interventions: Face mask in place.     Comments: Alert, well appearing, pleasant  HENT:     Head: Normocephalic and atraumatic.     Right Ear: External ear normal.     Left Ear: External ear normal.  Eyes:     General:        Right eye: No discharge.        Left eye: No discharge.     Conjunctiva/sclera: Conjunctivae normal.  Cardiovascular:     Rate and Rhythm: Normal rate and regular rhythm.     Pulses: Normal pulses.     Heart sounds: Normal heart sounds. No murmur heard.  No friction rub. No gallop.   Pulmonary:     Effort: Pulmonary effort is normal. No respiratory distress.     Breath sounds: Normal  breath sounds. No wheezing, rhonchi or rales.  Chest:     Comments: Right s/p mastectomy with pad Abdominal:     General: Bowel sounds are normal.     Palpations: Abdomen is soft.  Musculoskeletal:     Right lower leg: No edema.     Left lower leg: No edema.  Skin:    General: Skin is warm and dry.     Coloration: Skin is not jaundiced or pale.     Findings: No rash.     Comments: Right arm in compression sleeve Left wrist in compression   Neurological:     Mental Status: She is alert. Mental status is at baseline.     Gait: Gait normal.  Psychiatric:        Mood and Affect: Mood normal.        Behavior: Behavior normal. Behavior is cooperative.         Assessment & Plan:   1. Essential hypertension Doing well with med changes, no Sx or SE BP at goal - at home and other doctor appts BP optimal 110-120's  - COMPLETE METABOLIC PANEL WITH GFR  2. IFG (impaired fasting glucose) Recheck, consider victoza dose increase, no SE with starting meds or increasing dose - Hemoglobin A1c  3. Metabolic syndrome See above - consider med weight management or bariatric referral if pt continues to gain weight - COMPLETE METABOLIC PANEL WITH GFR - Lipid panel - Hemoglobin A1c  4. Morbid obesity with BMI of 50.0-59.9, adult ([place]) See above - CBC with Differential/Platelet - COMPLETE METABOLIC PANEL WITH GFR - Lipid panel - Hemoglobin A1c  5. Mixed hyperlipidemia Working on diet, no on statin, recheck - then monitor annually and monitor ASCVD risk  - COMPLETE METABOLIC PANEL WITH GFR - Lipid panel  6. Medication monitoring encounter - CBC with Differential/Platelet - COMPLETE METABOLIC  PANEL WITH GFR - Lipid panel - Hemoglobin A1c  7. Anemia, unspecified type She has stable anemia per chart review and hx of hemoglobin C trait, thrombocytopenia and on antineoplastic meds - recheck CBC - CBC with Differential/Platelet  8. Essential (hemorrhagic) thrombocythemia  ([place]) recheck labs, will update chart per results - CBC with Differential/Platelet  9. GERD without esophagitis Sx currently well controlled with OTC meds  10. Drug-induced polyneuropathy ([place]) Managed by neurology, on cymbalta and neurotin, sx well controlled  11. Lymphedema of right arm Stable, no change, she continues to follow with specialists at [facility], wears compression sleeve  12. Malignant neoplasm of upper-outer quadrant of right breast in female, estrogen receptor positive ([place]) Per [facility], last f/up mammogram reviewed, no changes, she continues to take arimidex per specialists   Review of victoza for weight management: SUBQ: Initial: 0.6 mg once daily for 1 week; increase by 0.6 mg daily at weekly intervals to a target dose of 3 mg once daily. If the patient cannot tolerate an increased dose during dose escalation, consider delaying dose escalation for 1 additional week. According to the manufacturer, efficacy has not been established at doses <3 mg/day; however, some experts will continue a patient on the maximum tolerated dose (even if <3 mg/day) if goal weight loss is achieved on that dose ([place] 2020). Note: Evaluate change in body weight after 12 weeks at maximum tolerated dose or 16 weeks after initiation of therapy; discontinue if at least 4% to 5% of baseline body weight loss has not been achieved (ADA 2021; manufacturer's labeling). Will have pt increase dose to up to 3 mg daily as tolerated - will do a f/up in ~3 months, if she has not lost weight will d/c meds     Return in about 6 months (around 09/18/2020) for Routine follow-up.   Delsa Grana, PA-C 03/21/20 11:04 AM

## 2020-03-22 LAB — CBC WITH DIFFERENTIAL/PLATELET
Absolute Monocytes: 328 cells/uL (ref 200–950)
Basophils Absolute: 38 cells/uL (ref 0–200)
Basophils Relative: 0.9 %
Eosinophils Absolute: 118 cells/uL (ref 15–500)
Eosinophils Relative: 2.8 %
HCT: 36.3 % (ref 35.0–45.0)
Hemoglobin: 12.5 g/dL (ref 11.7–15.5)
Lymphs Abs: 1121 cells/uL (ref 850–3900)
MCH: 28.5 pg (ref 27.0–33.0)
MCHC: 34.4 g/dL (ref 32.0–36.0)
MCV: 82.7 fL (ref 80.0–100.0)
MPV: 9 fL (ref 7.5–12.5)
Monocytes Relative: 7.8 %
Neutro Abs: 2596 cells/uL (ref 1500–7800)
Neutrophils Relative %: 61.8 %
Platelets: 463 10*3/uL — ABNORMAL HIGH (ref 140–400)
RBC: 4.39 10*6/uL (ref 3.80–5.10)
RDW: 14.4 % (ref 11.0–15.0)
Total Lymphocyte: 26.7 %
WBC: 4.2 10*3/uL (ref 3.8–10.8)

## 2020-03-22 LAB — LIPID PANEL
Cholesterol: 211 mg/dL — ABNORMAL HIGH (ref <200)
HDL: 50 mg/dL (ref >=50)
LDL Cholesterol (Calc): 142 mg/dL (calc) — ABNORMAL HIGH
Non-HDL Cholesterol (Calc): 161 mg/dL (calc) — ABNORMAL HIGH (ref <130)
Total CHOL/HDL Ratio: 4.2 (calc) (ref <5.0)
Triglycerides: 85 mg/dL (ref <150)

## 2020-03-22 LAB — COMPLETE METABOLIC PANEL WITH GFR
AG Ratio: 1.5 (calc) (ref 1.0–2.5)
ALT: 13 U/L (ref 6–29)
AST: 12 U/L (ref 10–30)
Albumin: 4.4 g/dL (ref 3.6–5.1)
Alkaline phosphatase (APISO): 69 U/L (ref 31–125)
BUN: 12 mg/dL (ref 7–25)
CO2: 30 mmol/L (ref 20–32)
Calcium: 9.8 mg/dL (ref 8.6–10.2)
Chloride: 106 mmol/L (ref 98–110)
Creat: 0.89 mg/dL (ref 0.50–1.10)
GFR, Est African American: 97 mL/min/{1.73_m2} (ref >=60)
GFR, Est Non African American: 83 mL/min/{1.73_m2} (ref >=60)
Globulin: 2.9 g/dL (calc) (ref 1.9–3.7)
Glucose, Bld: 86 mg/dL (ref 65–99)
Potassium: 3.9 mmol/L (ref 3.5–5.3)
Sodium: 142 mmol/L (ref 135–146)
Total Bilirubin: 0.3 mg/dL (ref 0.2–1.2)
Total Protein: 7.3 g/dL (ref 6.1–8.1)

## 2020-03-22 LAB — HEMOGLOBIN A1C
Hgb A1c MFr Bld: 5.8 % of total Hgb — ABNORMAL HIGH (ref <5.7)
Mean Plasma Glucose: 120 (calc)
eAG (mmol/L): 6.6 (calc)

## 2020-03-27 ENCOUNTER — Encounter: Payer: Self-pay | Admitting: Family Medicine

## 2020-04-01 DIAGNOSIS — G620 Drug-induced polyneuropathy: Secondary | ICD-10-CM | POA: Diagnosis not present

## 2020-04-01 DIAGNOSIS — M810 Age-related osteoporosis without current pathological fracture: Secondary | ICD-10-CM | POA: Diagnosis not present

## 2020-04-01 DIAGNOSIS — C50811 Malignant neoplasm of overlapping sites of right female breast: Secondary | ICD-10-CM | POA: Diagnosis not present

## 2020-04-01 DIAGNOSIS — M5410 Radiculopathy, site unspecified: Secondary | ICD-10-CM | POA: Diagnosis not present

## 2020-04-01 DIAGNOSIS — Z79811 Long term (current) use of aromatase inhibitors: Secondary | ICD-10-CM | POA: Diagnosis not present

## 2020-04-01 DIAGNOSIS — I890 Lymphedema, not elsewhere classified: Secondary | ICD-10-CM | POA: Diagnosis not present

## 2020-04-01 DIAGNOSIS — M545 Low back pain, unspecified: Secondary | ICD-10-CM | POA: Diagnosis not present

## 2020-04-01 DIAGNOSIS — Z9011 Acquired absence of right breast and nipple: Secondary | ICD-10-CM | POA: Diagnosis not present

## 2020-04-01 DIAGNOSIS — Z170 Estrogen receptor positive status [ER+]: Secondary | ICD-10-CM | POA: Diagnosis not present

## 2020-04-01 DIAGNOSIS — Z79899 Other long term (current) drug therapy: Secondary | ICD-10-CM | POA: Diagnosis not present

## 2020-04-01 DIAGNOSIS — G629 Polyneuropathy, unspecified: Secondary | ICD-10-CM | POA: Diagnosis not present

## 2020-05-21 DIAGNOSIS — C50811 Malignant neoplasm of overlapping sites of right female breast: Secondary | ICD-10-CM | POA: Diagnosis not present

## 2020-05-21 DIAGNOSIS — Z170 Estrogen receptor positive status [ER+]: Secondary | ICD-10-CM | POA: Diagnosis not present

## 2020-05-25 ENCOUNTER — Encounter: Payer: Self-pay | Admitting: Family Medicine

## 2020-05-25 DIAGNOSIS — Z6841 Body Mass Index (BMI) 40.0 and over, adult: Secondary | ICD-10-CM

## 2020-05-25 DIAGNOSIS — E8881 Metabolic syndrome: Secondary | ICD-10-CM

## 2020-05-25 DIAGNOSIS — E782 Mixed hyperlipidemia: Secondary | ICD-10-CM

## 2020-05-28 MED ORDER — VICTOZA 18 MG/3ML ~~LOC~~ SOPN: PEN_INJECTOR | SUBCUTANEOUS | 3 refills | Status: DC

## 2020-06-03 ENCOUNTER — Encounter: Payer: Self-pay | Admitting: Family Medicine

## 2020-06-04 ENCOUNTER — Telehealth: Payer: Self-pay

## 2020-06-04 DIAGNOSIS — E782 Mixed hyperlipidemia: Secondary | ICD-10-CM

## 2020-06-04 DIAGNOSIS — E8881 Metabolic syndrome: Secondary | ICD-10-CM

## 2020-06-04 DIAGNOSIS — R7303 Prediabetes: Secondary | ICD-10-CM

## 2020-06-04 NOTE — Telephone Encounter (Signed)
Copied from [place] [id number]. Topic: General - Other >> Jun 04, 2020  3:39 PM Tessa Lerner A wrote: Reason for CRM: [facility] from [facility] contacted practice to discuss an issue with patients liraglutide ([place]) 18 MG/3ML  prescription

## 2020-06-05 MED ORDER — VICTOZA 18 MG/3ML ~~LOC~~ SOPN: PEN_INJECTOR | SUBCUTANEOUS | 3 refills | Status: DC

## 2020-06-05 NOTE — Telephone Encounter (Signed)
The dosing you wrote for is for Saxenda need new script for the victoza with correct dosing

## 2020-06-06 MED ORDER — LIRAGLUTIDE 18 MG/3ML ~~LOC~~ SOPN: 1.8000 mg | PEN_INJECTOR | Freq: Every day | SUBCUTANEOUS | 2 refills | Status: DC

## 2020-06-06 NOTE — Telephone Encounter (Signed)
Please send script to pharmacy. The directions are for Saxenda that can be increased to 3.0 The victoza only go to 1.8

## 2020-06-06 NOTE — Addendum Note (Signed)
Addended by: Delsa Grana on: 06/06/2020 04:00 PM   Modules accepted: Orders

## 2020-07-12 ENCOUNTER — Encounter: Payer: Self-pay | Admitting: Family Medicine

## 2020-07-12 NOTE — Telephone Encounter (Signed)
Patient called the office to schedule appointment and theres no availability until 3/29.2022. Patient states time is sensitive and would like to be worked in next week, any day before 2pm due to her having to pick up her child after 2pm. Patient would like a follow up call regarding possible work in.

## 2020-07-15 ENCOUNTER — Other Ambulatory Visit: Payer: Self-pay

## 2020-07-15 ENCOUNTER — Encounter: Payer: Self-pay | Admitting: Family Medicine

## 2020-07-15 ENCOUNTER — Ambulatory Visit: Payer: Medicaid Other | Admitting: Family Medicine

## 2020-07-15 VITALS — BP 128/84 | HR 96 | Temp 98.3°F | Resp 16 | Ht 65.0 in | Wt 308.1 lb

## 2020-07-15 DIAGNOSIS — Z0289 Encounter for other administrative examinations: Secondary | ICD-10-CM

## 2020-07-15 MED ORDER — OLOPATADINE HCL 0.2 % OP SOLN
OPHTHALMIC | 11 refills | Status: DC
Start: 2020-07-15 — End: 2022-08-12

## 2020-07-15 NOTE — Patient Instructions (Signed)
It was great to see you!  Our plans for today:  - We filled out your work form today.  - Keep your follow up with Leisa. Congrats on working to lose weight!  Take care and seek immediate care sooner if you develop any concerns.   Dr. Ky Barban

## 2020-07-15 NOTE — Progress Notes (Signed)
   SUBJECTIVE:   CHIEF COMPLAINT / HPI:   Work form - needs form filled out for work. 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 - h/o HTN - well controlled on current regimen - h/o migraines - well controlled on current regimen.  - h/o allergies - needs refill for eye drops.   OBJECTIVE:   BP 128/84   Pulse 96   Temp 98.3 F (36.8 C)   Resp 16   Ht 5\' 5"  (1.651 m)   Wt (!) 308 lb 1.6 oz (139.8 kg)   LMP 02/08/2018 (Exact Date)   SpO2 98%   BMI 51.27 kg/m   Gen: well appearing, in NAD Card: Reg rate Lungs: Comfortable WOB on RA Ext: WWP   ASSESSMENT/PLAN:   Encounter for form completion Work form filled out, copy made for chart and original handed to patient. No Dx or meds that would impede ability to care for children. Has regular f/u scheduled.   Myles Gip, DO

## 2020-09-15 ENCOUNTER — Other Ambulatory Visit: Payer: Self-pay | Admitting: Family Medicine

## 2020-09-16 NOTE — Telephone Encounter (Signed)
Pt informed that prescription has been sent to pharmacy

## 2020-09-18 ENCOUNTER — Other Ambulatory Visit: Payer: Self-pay

## 2020-09-18 ENCOUNTER — Encounter: Payer: Self-pay | Admitting: Family Medicine

## 2020-09-18 ENCOUNTER — Ambulatory Visit: Payer: Medicaid Other | Admitting: Family Medicine

## 2020-09-18 VITALS — BP 116/72 | HR 99 | Temp 98.3°F | Resp 16 | Ht 65.0 in | Wt 304.7 lb

## 2020-09-18 DIAGNOSIS — E8881 Metabolic syndrome: Secondary | ICD-10-CM | POA: Diagnosis not present

## 2020-09-18 DIAGNOSIS — R7303 Prediabetes: Secondary | ICD-10-CM

## 2020-09-18 DIAGNOSIS — Z6841 Body Mass Index (BMI) 40.0 and over, adult: Secondary | ICD-10-CM | POA: Diagnosis not present

## 2020-09-18 DIAGNOSIS — Z1159 Encounter for screening for other viral diseases: Secondary | ICD-10-CM | POA: Diagnosis not present

## 2020-09-18 DIAGNOSIS — D473 Essential (hemorrhagic) thrombocythemia: Secondary | ICD-10-CM

## 2020-09-18 DIAGNOSIS — Z170 Estrogen receptor positive status [ER+]: Secondary | ICD-10-CM

## 2020-09-18 DIAGNOSIS — E785 Hyperlipidemia, unspecified: Secondary | ICD-10-CM | POA: Diagnosis not present

## 2020-09-18 DIAGNOSIS — C50411 Malignant neoplasm of upper-outer quadrant of right female breast: Secondary | ICD-10-CM | POA: Diagnosis not present

## 2020-09-18 DIAGNOSIS — G620 Drug-induced polyneuropathy: Secondary | ICD-10-CM | POA: Diagnosis not present

## 2020-09-18 DIAGNOSIS — I10 Essential (primary) hypertension: Secondary | ICD-10-CM

## 2020-09-18 MED ORDER — AMLODIPINE BESYLATE 5 MG PO TABS: 5.0000 mg | ORAL_TABLET | Freq: Every day | ORAL | 3 refills | Status: DC

## 2020-09-18 MED ORDER — METOPROLOL SUCCINATE ER 50 MG PO TB24: 50.0000 mg | ORAL_TABLET | Freq: Every day | ORAL | 3 refills | Status: DC

## 2020-09-18 MED ORDER — VALSARTAN-HYDROCHLOROTHIAZIDE 160-12.5 MG PO TABS: 1.0000 | ORAL_TABLET | Freq: Every day | ORAL | 3 refills | Status: DC

## 2020-09-18 MED ORDER — VICTOZA 18 MG/3ML ~~LOC~~ SOPN: PEN_INJECTOR | SUBCUTANEOUS | 3 refills | Status: DC

## 2020-09-18 NOTE — Progress Notes (Signed)
Name: Mckenzie Lopez   MRN: [id number]    DOB: 08/15/1985   Date:09/18/2020       Progress Note  Chief Complaint  Patient presents with  . Hypertension  . Medication Refill     Subjective:   Mckenzie Lopez is a 35 y.o. female, presents to clinic for routine f/up  Hypertension:  Currently managed on amlodipine 5 mg, cannot tolerate 10 mg due to severe lower extremity edema, on metoprolol 50 mg XL, and valsartan-HCTZ 160-12.5 mg daily Pt reports good med compliance and denies any SE.   Blood pressure today is well controlled. BP Readings from Last 3 Encounters:  09/18/20 116/72  07/15/20 128/84  03/21/20 126/78   Pt denies CP, SOB, exertional sx, LE edema, palpitation, Ha's, visual disturbances, lightheadedness, hypotension, syncope. Dietary efforts for BP?  Continue to work on diet and exercise efforts  Obesity and prediabetes metabolic syndrome - on victoza 1.8 mg daily, was gaining weight since restarting this med last year, weight going down over the past 6 months, down about 10 pounds Wt Readings from Last 5 Encounters:  09/18/20 (!) 304 lb 11.2 oz (138.2 kg)  07/15/20 (!) 308 lb 1.6 oz (139.8 kg)  03/21/20 (!) 314 lb 4.8 oz (142.6 kg)  12/20/19 (!) 305 lb 8 oz (138.6 kg)  11/14/19 (!) 304 lb (137.9 kg)   BMI Readings from Last 5 Encounters:  09/18/20 50.70 kg/m  07/15/20 51.27 kg/m  03/21/20 52.30 kg/m  12/20/19 50.84 kg/m  11/14/19 50.59 kg/m  Last A1c on Victoza within prediabetic range Lab Results  Component Value Date   HGBA1C 5.8 (H) 03/21/2020   Monitoring electrolytes and BP , BP has been good at home, no SE from meds  She seeing [facility]ology at [facility]  Routinely for management of neuropathy, lymphadema, and continued tx with arimidex, Cymbalta, Neurontin and supplements including oral B12 magnesium, vitamin D, calcium  Vit d deficiency - on Rx supplement and OTC supplement  B12 deficiency on oral supplement  AR -on Claritin and Flonase  as needed, currently allergies are well controlled   Current Outpatient Medications:  .  calcium carbonate (TUMS - DOSED IN MG ELEMENTAL CALCIUM) 500 MG chewable tablet, Chew 1 tablet by mouth daily., Disp: , Rfl:  .  DULoxetine (CYMBALTA) 30 MG capsule, Take 90 mg by mouth daily., Disp: , Rfl:  .  ergocalciferol (VITAMIN D2) 1.25 MG (50000 UT) capsule, Take 1 capsule by mouth once a week., Disp: , Rfl:  .  fluticasone (FLONASE) 50 MCG/ACT nasal spray, SHAKE LIQUID AND USE 2 SPRAYS IN EACH NOSTRIL DAILY AS NEEDED FOR ALLERGIES, Disp: 48 g, Rfl: 3 .  gabapentin (NEURONTIN) 300 MG capsule, Take 300 mg by mouth 3 (three) times daily., Disp: , Rfl:  .  loratadine (CLARITIN) 10 MG tablet, Take 10 mg by mouth daily., Disp: , Rfl:  .  magnesium oxide (MAG-OX) 400 MG tablet, Take by mouth., Disp: , Rfl:  .  Olopatadine HCl 0.2 % SOLN, One drop in each eye daily, Disp: 2.5 mL, Rfl: 11 .  vitamin B-12 (CYANOCOBALAMIN) 100 MCG tablet, Take 100 mcg by mouth daily., Disp: , Rfl:  .  amLODipine (NORVASC) 5 MG tablet, Take 1 tablet (5 mg total) by mouth daily., Disp: 90 tablet, Rfl: 3 .  liraglutide (VICTOZA) 18 MG/3ML SOPN, ADMINISTER 1.8 MG UNDER THE SKIN DAILY, Disp: 27 mL, Rfl: 3 .  metFORMIN (GLUCOPHAGE) 500 MG tablet, Take 1 tablet (500 mg total) by mouth 2 (two)  times daily with a meal., Disp: 180 tablet, Rfl: 1 .  metoprolol succinate (TOPROL XL) 50 MG 24 hr tablet, Take 1 tablet (50 mg total) by mouth daily. Take with or immediately following a meal., Disp: 90 tablet, Rfl: 3 .  valsartan-hydrochlorothiazide (DIOVAN HCT) 160-12.5 MG tablet, Take 1 tablet by mouth daily., Disp: 90 tablet, Rfl: 3  Patient Active Problem List   Diagnosis Date Noted  . Chemotherapy-induced neuropathy ([place]) 09/20/2019  . At risk for loss of bone density 09/20/2019  . LVH (left ventricular hypertrophy) due to hypertensive disease, without heart failure 09/19/2019  . H/O cardiomegaly 09/19/2019  . Metabolic syndrome  25/85/2778  . S/P hysterectomy with oophorectomy 12/30/2018  . Lymphedema of right arm 12/21/2018  . GERD without esophagitis 12/21/2018  . Malignant neoplasm of upper-outer quadrant of right breast in female, estrogen receptor positive ([place]) 12/07/2017  . Family history of breast cancer 10/13/2016  . Dysplasia of cervix, low grade (CIN 1) 10/13/2016  . Thrombocytosis 03/16/2016  . Hemoglobin C trait ([place]) 05/08/2015  . Vitamin D deficiency 02/21/2015  . Morbid obesity with BMI of 50.0-59.9, adult ([place])   . Hyperlipidemia   . Hypertension   . IFG (impaired fasting glucose)   . Migraines     Past Surgical History:  Procedure Laterality Date  . ABDOMINAL HYSTERECTOMY  11/28/2018   Full  . AXILLARY LYMPH NODE BIOPSY Right 12/01/2017   METASTATIC MAMMARY CARCINOMA  . AXILLARY LYMPH NODE BIOPSY Left 12/01/2017   NEGATIVE FOR MALIGNANCY  . BREAST BIOPSY Right 12/01/2017   11 and 12 o'clock, INVASIVE MAMMARY CARCINOMA, ER/PR positive HER2 negative  . MASTECTOMY Right 01/05/2018    Family History  Problem Relation Age of Onset  . Diabetes Mother   . Hypertension Mother   . Thyroid disease Mother   . Breast cancer Maternal Grandmother        ?age  . Diabetes Maternal Grandfather   . Hypertension Maternal Grandfather   . Seizures Maternal Grandfather   . Breast cancer Maternal Aunt        early 27's  . Diabetes Sister   . Diabetes Paternal Grandmother   . Heart disease Neg Hx   . Stroke Neg Hx   . COPD Neg Hx   . Ovarian cancer Neg Hx     Social History   Tobacco Use  . Smoking status: Never Smoker  . Smokeless tobacco: Never Used  Vaping Use  . Vaping Use: Never used  Substance Use Topics  . Alcohol use: No  . Drug use: No     Allergies  Allergen Reactions  . Chlorthalidone Other (See Comments)    Severe hypokalemia    Health Maintenance  Topic Date Due  . Hepatitis C Screening  Never done  . COVID-19 Vaccine (4 - Booster for Moderna series) 09/23/2020   . INFLUENZA VACCINE  12/09/2020  . TETANUS/TDAP  09/17/2025  . HIV Screening  Completed  . HPV VACCINES  Aged Out  . PAP SMEAR-Modifier  Discontinued    Chart Review Today: I personally reviewed active problem list, medication list, allergies, family history, social history, health maintenance, notes from last encounter, lab results, imaging with the patient/caregiver today.   Review of Systems  Constitutional: Negative.   HENT: Negative.    Eyes: Negative.   Respiratory: Negative.    Cardiovascular: Negative.   Gastrointestinal: Negative.   Endocrine: Negative.   Genitourinary: Negative.   Musculoskeletal: Negative.   Skin: Negative.   Allergic/Immunologic: Negative.  Neurological: Negative.   Hematological: Negative.   Psychiatric/Behavioral: Negative.    All other systems reviewed and are negative.   Objective:   Vitals:   09/18/20 1440  BP: 116/72  Pulse: 99  Resp: 16  Temp: 98.3 F (36.8 C)  SpO2: 99%  Weight: (!) 304 lb 11.2 oz (138.2 kg)  Height: _0  (1.651 m)    Body mass index is 50.7 kg/m.  Physical Exam Vitals and nursing note reviewed.  Constitutional:      General: She is not in acute distress.    Appearance: Normal appearance. She is well-developed and well-groomed. She is obese. She is not ill-appearing, toxic-appearing or diaphoretic.     Interventions: Face mask in place.     Comments: Alert, well appearing, pleasant  HENT:     Head: Normocephalic and atraumatic.     Right Ear: External ear normal.     Left Ear: External ear normal.  Eyes:     General:        Right eye: No discharge.        Left eye: No discharge.     Conjunctiva/sclera: Conjunctivae normal.  Cardiovascular:     Rate and Rhythm: Normal rate and regular rhythm.     Pulses: Normal pulses.     Heart sounds: Normal heart sounds. No murmur heard.   No friction rub. No gallop.  Pulmonary:     Effort: Pulmonary effort is normal. No respiratory distress.     Breath  sounds: Normal breath sounds. No wheezing, rhonchi or rales.  Chest:     Comments: Right s/p mastectomy with pad Abdominal:     General: Bowel sounds are normal.     Palpations: Abdomen is soft.  Musculoskeletal:     Right lower leg: No edema.     Left lower leg: No edema.  Skin:    General: Skin is warm and dry.     Coloration: Skin is not jaundiced or pale.     Findings: No rash.     Comments: Right arm in compression sleeve  Neurological:     Mental Status: She is alert. Mental status is at baseline.     Gait: Gait normal.  Psychiatric:        Mood and Affect: Mood normal.        Behavior: Behavior normal. Behavior is cooperative.        Assessment & Plan:     ICD-10-CM   1. Essential hypertension  I10 amLODipine (NORVASC) 5 MG tablet    valsartan-hydrochlorothiazide (DIOVAN HCT) 160-12.5 MG tablet    metoprolol succinate (TOPROL XL) 50 MG 24 hr tablet    COMPLETE METABOLIC PANEL WITH GFR   Stable, well-controlled on amlodipine, metoprolol XL and valsartan-HCTZ no side effects or concerns, monitoring labs, status post hysterectomy    2. Malignant neoplasm of upper-outer quadrant of right breast in female, estrogen receptor positive ([place])  C50.411    Z17.0    Per specialist at [facility] status post right radical mastectomy with neuropathy secondary to chemo and right upper extremity lymphadenopathy    3. Encounter for hepatitis C screening test for low risk patient  Z11.59 Hepatitis C Antibody    4. Hyperlipidemia, unspecified hyperlipidemia type  T70.0 COMPLETE METABOLIC PANEL WITH GFR   Lipids elevated about 6 months ago lower risk due to age and not yet diagnosed with diabetes but only prediabetes, will recheck at next follow-up visit    5. Prediabetes  F74.94 COMPLETE METABOLIC PANEL WITH  GFR    Hemoglobin A1c    liraglutide (VICTOZA) 18 MG/3ML SOPN    Insulin, Free (Bioactive)    Amb Ref to Medical Weight Management    Insulin, Free (Bioactive)   Last A1c 5.8, on  Victoza and metformin working on diet lifestyle efforts    6. Morbid obesity with BMI of 50.0-59.9, adult (HCC)  D80.06 COMPLETE METABOLIC PANEL WITH GFR   Z68.43 Hemoglobin A1c    liraglutide (VICTOZA) 18 MG/3ML SOPN    Insulin, Free (Bioactive)    Amb Ref to Medical Weight Management    Insulin, Free (Bioactive)   Patient has started to lose some weight with Victoza and improved diet lifestyle efforts feel she would benefit from medical weight management consult and mgmt    7. Metabolic syndrome  J49.49 COMPLETE METABOLIC PANEL WITH GFR    Hemoglobin A1c    liraglutide (VICTOZA) 18 MG/3ML SOPN    Insulin, Free (Bioactive)    Amb Ref to Medical Weight Management    Insulin, Free (Bioactive)   same as above - already on metformin and GLP-1, refer to specialist for further eval and tx    8. Drug-induced polyneuropathy ([place]) Chronic G62.0    Managed by [facility] specialist but Cymbalta, gabapentin    9. Essential (hemorrhagic) thrombocythemia ([place]) Chronic D47.3    Managed and monitored by [facility] specialist, records and labs reviewed through care everywhere today          Return in about 6 months (around 03/21/2021) for Annual Physical, Routine follow-up.   Delsa Grana, PA-C 09/18/20 3:00 PM

## 2020-09-24 LAB — COMPLETE METABOLIC PANEL WITH GFR
AG Ratio: 1.5 (calc) (ref 1.0–2.5)
ALT: 13 U/L (ref 6–29)
AST: 11 U/L (ref 10–30)
Albumin: 4.2 g/dL (ref 3.6–5.1)
Alkaline phosphatase (APISO): 55 U/L (ref 31–125)
BUN: 16 mg/dL (ref 7–25)
CO2: 33 mmol/L — ABNORMAL HIGH (ref 20–32)
Calcium: 9.8 mg/dL (ref 8.6–10.2)
Chloride: 106 mmol/L (ref 98–110)
Creat: 0.99 mg/dL (ref 0.50–1.10)
GFR, Est African American: 85 mL/min/{1.73_m2} (ref >=60)
GFR, Est Non African American: 73 mL/min/{1.73_m2} (ref >=60)
Globulin: 2.8 g/dL (calc) (ref 1.9–3.7)
Glucose, Bld: 123 mg/dL — ABNORMAL HIGH (ref 65–99)
Potassium: 3.7 mmol/L (ref 3.5–5.3)
Sodium: 146 mmol/L (ref 135–146)
Total Bilirubin: 0.2 mg/dL (ref 0.2–1.2)
Total Protein: 7 g/dL (ref 6.1–8.1)

## 2020-09-24 LAB — HEPATITIS C ANTIBODY
Hepatitis C Ab: NONREACTIVE
SIGNAL TO CUT-OFF: 0.01 (ref <1.00)

## 2020-09-24 LAB — HEMOGLOBIN A1C
Hgb A1c MFr Bld: 5.5 % of total Hgb (ref <5.7)
Mean Plasma Glucose: 111 mg/dL
eAG (mmol/L): 6.2 mmol/L

## 2020-09-24 LAB — INSULIN, FREE (BIOACTIVE): Insulin, Free: 256.2 u[IU]/mL — ABNORMAL HIGH (ref 1.5–14.9)

## 2020-09-25 ENCOUNTER — Other Ambulatory Visit: Payer: Self-pay | Admitting: Family Medicine

## 2020-09-25 DIAGNOSIS — E8881 Metabolic syndrome: Secondary | ICD-10-CM

## 2020-09-25 MED ORDER — METFORMIN HCL 500 MG PO TABS: 500.0000 mg | ORAL_TABLET | Freq: Two times a day (BID) | ORAL | 1 refills | Status: DC

## 2020-09-30 DIAGNOSIS — I890 Lymphedema, not elsewhere classified: Secondary | ICD-10-CM | POA: Diagnosis not present

## 2020-09-30 DIAGNOSIS — Z79811 Long term (current) use of aromatase inhibitors: Secondary | ICD-10-CM | POA: Diagnosis not present

## 2020-09-30 DIAGNOSIS — Z9011 Acquired absence of right breast and nipple: Secondary | ICD-10-CM | POA: Diagnosis not present

## 2020-09-30 DIAGNOSIS — Z90721 Acquired absence of ovaries, unilateral: Secondary | ICD-10-CM | POA: Diagnosis not present

## 2020-09-30 DIAGNOSIS — E559 Vitamin D deficiency, unspecified: Secondary | ICD-10-CM | POA: Diagnosis not present

## 2020-09-30 DIAGNOSIS — M816 Localized osteoporosis [Lequesne]: Secondary | ICD-10-CM | POA: Diagnosis not present

## 2020-09-30 DIAGNOSIS — Z9189 Other specified personal risk factors, not elsewhere classified: Secondary | ICD-10-CM | POA: Diagnosis not present

## 2020-09-30 DIAGNOSIS — Z170 Estrogen receptor positive status [ER+]: Secondary | ICD-10-CM | POA: Diagnosis not present

## 2020-09-30 DIAGNOSIS — C50811 Malignant neoplasm of overlapping sites of right female breast: Secondary | ICD-10-CM | POA: Diagnosis not present

## 2020-09-30 DIAGNOSIS — I972 Postmastectomy lymphedema syndrome: Secondary | ICD-10-CM | POA: Diagnosis not present

## 2020-09-30 DIAGNOSIS — G620 Drug-induced polyneuropathy: Secondary | ICD-10-CM | POA: Diagnosis not present

## 2020-09-30 DIAGNOSIS — Z90710 Acquired absence of both cervix and uterus: Secondary | ICD-10-CM | POA: Diagnosis not present

## 2020-10-29 ENCOUNTER — Other Ambulatory Visit: Payer: Self-pay | Admitting: Family Medicine

## 2020-11-14 NOTE — Progress Notes (Signed)
Chief Complaint  Patient presents with   Gynecologic Exam    No concerns     HPI:      [name] is a 35 y.o. G1P1001 who LMP was Patient's last menstrual period was 02/08/2018 (exact date)., presents today for her annual examination. Her menses are absent since 10/19 chemo start and now s/p lap hyst BSO 7/20.  Has tolerable vasomotor sx with arimidex, already on gabapentin. No PMB.   Sex activity: not sexually active; no vag sx. Last Pap: 10/13/16  Results were: no abnormalities /neg HPV DNA; s/p lap hyst BSO 7/20 Hx of STDs: chlamydia, CIN 1 in 2014   Last mammo: 01/01/20 LT side at [facility]; normal; repeat due in 12 months. Mammo 11/26/17 was Birads-5. Bx with Dr. Bary Castilla showed RT intraductal carcinoma with mets to axillary lymph nodes. Did RT mastectomy and chemo at [facility], now on arimidex for 10 yrs (changed from femara due to knee pain). 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 Bary Castilla; Vistaseq panel testing done 6/20 was negative.   There is a FH of breast cancer in her MGM and mat aunt, genetic testing not done by them. Pt is BRCA neg/Vistaseq panel neg 6/20. There is no FH of ovarian cancer. The patient does do self-breast exams.    Tobacco use: The patient denies current or previous tobacco use. Alcohol use: none No drug use Exercise: moderately active   She does get adequate calcium and Vitamin D in her diet (on Rx).   She has her labs managed with PCP.  DEXA 2021 at [facility]. Osteoporosis in spine, normal hip.    Past Medical History:  Diagnosis Date   BRCA negative 2019   BRCA with Dr. Bary Castilla, 7/20 Vistaseq panel neg with Ardeth Perfect, PA-C   CHF (congestive heart failure) ([place])    POSTPARTUM 2017/ RESOLVED   Eczema    Hemoglobin C trait ([place]) 11/29/2015   Hyperlipidemia    Hypertension    IFG (impaired fasting glucose)    Intraductal carcinoma of right breast 11/2017   ER/PR+; Her2neu neg; mets to LN   Low back pain    Migraines    Morbid obesity  (HCC)    Premature surgical menopause 11/2018    Past Surgical History:  Procedure Laterality Date   ABDOMINAL HYSTERECTOMY  11/28/2018   Full   AXILLARY LYMPH NODE BIOPSY Right 12/01/2017   METASTATIC MAMMARY CARCINOMA   AXILLARY LYMPH NODE BIOPSY Left 12/01/2017   NEGATIVE FOR MALIGNANCY   BREAST BIOPSY Right 12/01/2017   11 and 12 o'clock, INVASIVE MAMMARY CARCINOMA, ER/PR positive HER2 negative   MASTECTOMY Right 01/05/2018    Family History  Problem Relation Age of Onset   Diabetes Mother    Hypertension Mother    Thyroid disease Mother    Breast cancer Maternal Grandmother        ?age   Diabetes Maternal Grandfather    Hypertension Maternal Grandfather    Seizures Maternal Grandfather    Breast cancer Maternal Aunt        early 22's   Diabetes Sister    Diabetes Paternal Grandmother    Heart disease Neg Hx    Stroke Neg Hx    COPD Neg Hx    Ovarian cancer Neg Hx     Social History   Socioeconomic History   Marital status: Single    Spouse name: Not on file   Number of children: Not on file   Years of education:  Not on file   Highest education level: Not on file  Occupational History   Not on file  Tobacco Use   Smoking status: Never   Smokeless tobacco: Never  Vaping Use   Vaping Use: Never used  Substance and Sexual Activity   Alcohol use: No   Drug use: No   Sexual activity: Not Currently    Birth control/protection: None, Surgical    Comment: Hysterectomy  Other Topics Concern   Not on file  Social History Narrative   Not on file   Social Determinants of Health   Financial Resource Strain: Not on file  Food Insecurity: Not on file  Transportation Needs: Not on file  Physical Activity: Not on file  Stress: Not on file  Social Connections: Not on file  Intimate Partner Violence: Not on file    Current Outpatient Medications on File Prior to Visit  Medication Sig Dispense Refill   amLODipine (NORVASC) 5 MG tablet Take 1 tablet (5 mg  total) by mouth daily. 90 tablet 3   anastrozole (ARIMIDEX) 1 MG tablet Take by mouth.     calcium carbonate (TUMS - DOSED IN MG ELEMENTAL CALCIUM) 500 MG chewable tablet Chew 1 tablet by mouth daily.     DULoxetine (CYMBALTA) 30 MG capsule Take 90 mg by mouth daily.     ergocalciferol (VITAMIN D2) 1.25 MG (50000 UT) capsule Take 1 capsule by mouth once a week.     fluticasone (FLONASE) 50 MCG/ACT nasal spray SHAKE LIQUID AND USE 2 SPRAYS IN EACH NOSTRIL DAILY AS NEEDED FOR ALLERGIES 48 g 0   gabapentin (NEURONTIN) 300 MG capsule Take 300 mg by mouth 3 (three) times daily.     liraglutide (VICTOZA) 18 MG/3ML SOPN ADMINISTER 1.8 MG UNDER THE SKIN DAILY 27 mL 3   loratadine (CLARITIN) 10 MG tablet Take 10 mg by mouth daily.     magnesium oxide (MAG-OX) 400 MG tablet Take by mouth.     metFORMIN (GLUCOPHAGE) 500 MG tablet Take 1 tablet (500 mg total) by mouth 2 (two) times daily with a meal. 180 tablet 1   metoprolol succinate (TOPROL XL) 50 MG 24 hr tablet Take 1 tablet (50 mg total) by mouth daily. Take with or immediately following a meal. 90 tablet 3   Olopatadine HCl 0.2 % SOLN One drop in each eye daily 2.5 mL 11   valsartan-hydrochlorothiazide (DIOVAN HCT) 160-12.5 MG tablet Take 1 tablet by mouth daily. 90 tablet 3   vitamin B-12 (CYANOCOBALAMIN) 100 MCG tablet Take 100 mcg by mouth daily.     No current facility-administered medications on file prior to visit.    ROS:  Review of Systems  Constitutional:  Negative for fatigue, fever and unexpected weight change.  Respiratory:  Negative for cough, shortness of breath and wheezing.   Cardiovascular:  Negative for chest pain, palpitations and leg swelling.  Gastrointestinal:  Negative for blood in stool, constipation, diarrhea, nausea and vomiting.  Endocrine: Negative for cold intolerance, heat intolerance and polyuria.  Genitourinary:  Negative for dyspareunia, dysuria, flank pain, frequency, genital sores, hematuria, menstrual  problem, pelvic pain, urgency, vaginal bleeding, vaginal discharge and vaginal pain.  Musculoskeletal:  Positive for arthralgias and joint swelling. Negative for back pain and myalgias.  Skin:  Negative for rash.  Neurological:  Positive for numbness. Negative for dizziness, syncope, light-headedness and headaches.  Hematological:  Negative for adenopathy.  Psychiatric/Behavioral:  Negative for agitation, confusion, sleep disturbance and suicidal ideas. The patient is not nervous/anxious.  Objective: BP (!) 140/100   Ht 5' 5" (1.651 m)   Wt 296 lb (134.3 kg)   LMP 02/08/2018 (Exact Date)   BMI 49.26 kg/m    Physical Exam Constitutional:      Appearance: She is well-developed.  Genitourinary:     Vulva normal.     Genitourinary Comments: UTERUS/CX SURG REM     Right Labia: No rash, tenderness or lesions.    Left Labia: No tenderness, lesions or rash.    Vaginal cuff intact.    No vaginal discharge, erythema or tenderness.      Right Adnexa: not tender and no mass present.    Left Adnexa: not tender and no mass present.    Cervix is absent.     No cervical motion tenderness or polyp.     Uterus is not enlarged or tender.     Uterus is absent.  Breasts:    Right: Absent. No mass, nipple discharge, skin change or tenderness.     Left: No mass, nipple discharge, skin change or tenderness.  Neck:     Thyroid: No thyromegaly.  Cardiovascular:     Rate and Rhythm: Normal rate and regular rhythm.     Heart sounds: Normal heart sounds. No murmur heard. Pulmonary:     Effort: Pulmonary effort is normal.     Breath sounds: Normal breath sounds.  Chest:     Comments: RT BREAST ABSENT Abdominal:     Palpations: Abdomen is soft.     Tenderness: There is no abdominal tenderness. There is no guarding.  Musculoskeletal:        General: Normal range of motion.     Cervical back: Normal range of motion.  Neurological:     General: No focal deficit present.     Mental Status: She  is alert and oriented to person, place, and time.     Cranial Nerves: No cranial nerve deficit.  Skin:    General: Skin is warm and dry.  Psychiatric:        Mood and Affect: Mood normal.        Behavior: Behavior normal.        Thought Content: Thought content normal.        Judgment: Judgment normal.  Vitals reviewed.    Assessment/Plan: Encounter for annual routine gynecological examination  Malignant neoplasm of upper-outer quadrant of right breast in female, estrogen receptor positive (HCC)--managed at [facility]. Doing arimidex for 10 yrs. Doing well.   Premature surgical menopause--cont ca/Vit D/exercise. Current on DEXA. Can't have hormones.   GYN counsel breast self exam, mammography screening, adequate intake of calcium and vitamin D, diet and exercise     F/U  Return in about 1 year (around 11/15/2021).  Alicia B. Copland, PA-C 11/15/2020 10:18 AM

## 2020-11-15 ENCOUNTER — Ambulatory Visit (INDEPENDENT_AMBULATORY_CARE_PROVIDER_SITE_OTHER): Payer: Medicaid Other | Admitting: Obstetrics and Gynecology

## 2020-11-15 ENCOUNTER — Encounter: Payer: Self-pay | Admitting: Obstetrics and Gynecology

## 2020-11-15 ENCOUNTER — Other Ambulatory Visit: Payer: Self-pay

## 2020-11-15 VITALS — BP 140/100 | Ht 65.0 in | Wt 296.0 lb

## 2020-11-15 DIAGNOSIS — Z01419 Encounter for gynecological examination (general) (routine) without abnormal findings: Secondary | ICD-10-CM

## 2020-11-15 DIAGNOSIS — C50411 Malignant neoplasm of upper-outer quadrant of right female breast: Secondary | ICD-10-CM

## 2020-11-15 DIAGNOSIS — Z170 Estrogen receptor positive status [ER+]: Secondary | ICD-10-CM | POA: Diagnosis not present

## 2020-11-15 DIAGNOSIS — E8940 Asymptomatic postprocedural ovarian failure: Secondary | ICD-10-CM | POA: Diagnosis not present

## 2021-01-06 DIAGNOSIS — C50811 Malignant neoplasm of overlapping sites of right female breast: Secondary | ICD-10-CM | POA: Diagnosis not present

## 2021-01-06 DIAGNOSIS — R922 Inconclusive mammogram: Secondary | ICD-10-CM | POA: Diagnosis not present

## 2021-01-06 DIAGNOSIS — Z170 Estrogen receptor positive status [ER+]: Secondary | ICD-10-CM | POA: Diagnosis not present

## 2021-01-06 DIAGNOSIS — Z9011 Acquired absence of right breast and nipple: Secondary | ICD-10-CM | POA: Diagnosis not present

## 2021-01-09 DIAGNOSIS — C50811 Malignant neoplasm of overlapping sites of right female breast: Secondary | ICD-10-CM | POA: Diagnosis not present

## 2021-01-09 DIAGNOSIS — Z4431 Encounter for fitting and adjustment of external right breast prosthesis: Secondary | ICD-10-CM | POA: Diagnosis not present

## 2021-01-09 DIAGNOSIS — Z9011 Acquired absence of right breast and nipple: Secondary | ICD-10-CM | POA: Diagnosis not present

## 2021-01-17 DIAGNOSIS — Z4431 Encounter for fitting and adjustment of external right breast prosthesis: Secondary | ICD-10-CM | POA: Diagnosis not present

## 2021-01-17 DIAGNOSIS — C50811 Malignant neoplasm of overlapping sites of right female breast: Secondary | ICD-10-CM | POA: Diagnosis not present

## 2021-01-17 DIAGNOSIS — Z9011 Acquired absence of right breast and nipple: Secondary | ICD-10-CM | POA: Diagnosis not present

## 2021-01-17 DIAGNOSIS — I972 Postmastectomy lymphedema syndrome: Secondary | ICD-10-CM | POA: Diagnosis not present

## 2021-02-13 ENCOUNTER — Other Ambulatory Visit: Payer: Self-pay | Admitting: Family Medicine

## 2021-02-13 DIAGNOSIS — R7301 Impaired fasting glucose: Secondary | ICD-10-CM

## 2021-03-07 DIAGNOSIS — Z170 Estrogen receptor positive status [ER+]: Secondary | ICD-10-CM | POA: Diagnosis not present

## 2021-03-07 DIAGNOSIS — C50811 Malignant neoplasm of overlapping sites of right female breast: Secondary | ICD-10-CM | POA: Diagnosis not present

## 2021-03-17 ENCOUNTER — Encounter: Payer: Self-pay | Admitting: Family Medicine

## 2021-03-20 ENCOUNTER — Encounter: Payer: Self-pay | Admitting: Nurse Practitioner

## 2021-03-20 ENCOUNTER — Other Ambulatory Visit: Payer: Self-pay | Admitting: Family Medicine

## 2021-03-20 ENCOUNTER — Ambulatory Visit: Payer: Medicaid Other | Admitting: Nurse Practitioner

## 2021-03-20 ENCOUNTER — Other Ambulatory Visit: Payer: Self-pay

## 2021-03-20 VITALS — BP 130/76 | HR 98 | Temp 98.2°F | Resp 18 | Ht 65.0 in | Wt 289.0 lb

## 2021-03-20 DIAGNOSIS — I10 Essential (primary) hypertension: Secondary | ICD-10-CM

## 2021-03-20 DIAGNOSIS — E785 Hyperlipidemia, unspecified: Secondary | ICD-10-CM | POA: Diagnosis not present

## 2021-03-20 DIAGNOSIS — Z6841 Body Mass Index (BMI) 40.0 and over, adult: Secondary | ICD-10-CM

## 2021-03-20 DIAGNOSIS — Z5181 Encounter for therapeutic drug level monitoring: Secondary | ICD-10-CM

## 2021-03-20 DIAGNOSIS — R7303 Prediabetes: Secondary | ICD-10-CM | POA: Diagnosis not present

## 2021-03-20 DIAGNOSIS — E8881 Metabolic syndrome: Secondary | ICD-10-CM

## 2021-03-20 DIAGNOSIS — Z170 Estrogen receptor positive status [ER+]: Secondary | ICD-10-CM

## 2021-03-20 DIAGNOSIS — C50411 Malignant neoplasm of upper-outer quadrant of right female breast: Secondary | ICD-10-CM

## 2021-03-20 MED ORDER — METFORMIN HCL 500 MG PO TABS: ORAL_TABLET | ORAL | 0 refills | Status: DC

## 2021-03-20 MED ORDER — AMLODIPINE BESYLATE 5 MG PO TABS: 5.0000 mg | ORAL_TABLET | Freq: Every day | ORAL | 3 refills | Status: DC

## 2021-03-20 MED ORDER — VALSARTAN-HYDROCHLOROTHIAZIDE 160-12.5 MG PO TABS: 1.0000 | ORAL_TABLET | Freq: Every day | ORAL | 3 refills | Status: DC

## 2021-03-20 MED ORDER — METOPROLOL SUCCINATE ER 50 MG PO TB24: 50.0000 mg | ORAL_TABLET | Freq: Every day | ORAL | 3 refills | Status: DC

## 2021-03-20 MED ORDER — NOVOFINE PEN NEEDLE 32G X 6 MM MISC: 50.0000 | 1 refills | Status: DC

## 2021-03-20 NOTE — Progress Notes (Signed)
BP 130/76   Pulse 98   Temp 98.2 F (36.8 C) (Oral)   Resp 18   Ht 5\' 5"  (1.651 m)   Wt 289 lb (131.1 kg)   LMP 02/08/2018 (Exact Date)   SpO2 98%   BMI 48.09 kg/m    Subjective:    Patient ID: Mckenzie Lopez, female    DOB: Dec 04, 1985, 35 y.o.   MRN: [id number]  HPI: Mckenzie Lopez is a 35 y.o. female, here alone  Chief Complaint  Patient presents with   Hypertension   Obesity   HTN: Her blood pressure here was 130/76.  She says she has been checking her blood pressure at home and it is running 120s/70s at home.  She denies any chest pain, shortness of breath, lower extremity edema, headaches, or blurred vision.  She is currently taking amlodipine 5 mg daily, valsartan-hydrochlorothiazide 160-12.5 mg daily and metoprolol 50 mg daily.  Obesity:  She is currently down about 15 lbs.  Current weight 289 lbs.  She has been watching her diet and has been taking metformin and victoza.  Prediabetes/ insulin resistance/metabolic syndrome:  She is currently taking Victoza and Metformin. Last A1C was 5.8 on 09/18/20.  Will get labs today.  Hyperlipidemia: Last LDL was 142 on 03/21/2020.  Will recheck today.  Breast Cancer: She sees oncology at [facility].  They are managing her neuropathy, lymphadema and continued treatment with arimidex.   Relevant past medical, surgical, family and social history reviewed and updated as indicated. Interim medical history since our last visit reviewed. Allergies and medications reviewed and updated.  Review of Systems  Constitutional: Negative for fever, positive for weight change.  Respiratory: Negative for cough and shortness of breath.   Cardiovascular: Negative for chest pain or palpitations.  Gastrointestinal: Negative for abdominal pain, no bowel changes.  Musculoskeletal: Negative for gait problem or joint swelling.  Skin: Negative for rash.  Neurological: Negative for dizziness or headache.  No other specific complaints in a  complete review of systems (except as listed in HPI above).      Objective:    BP 130/76   Pulse 98   Temp 98.2 F (36.8 C) (Oral)   Resp 18   Ht 5\' 5"  (1.651 m)   Wt 289 lb (131.1 kg)   LMP 02/08/2018 (Exact Date)   SpO2 98%   BMI 48.09 kg/m   Wt Readings from Last 3 Encounters:  03/20/21 289 lb (131.1 kg)  11/15/20 296 lb (134.3 kg)  09/18/20 (!) 304 lb 11.2 oz (138.2 kg)    Physical Exam  Constitutional: Patient appears well-developed and well-nourished. Obese No distress.  HEENT: head atraumatic, normocephalic, pupils equal and reactive to light, neck supple Cardiovascular: Normal rate, regular rhythm and normal heart sounds.  No murmur heard. No BLE edema. Pulmonary/Chest: Effort normal and breath sounds normal. No respiratory distress. Abdominal: Soft.  There is no tenderness. Psychiatric: Patient has a normal mood and affect. behavior is normal. Judgment and thought content normal.   Results for orders placed or performed in visit on 09/18/20  COMPLETE METABOLIC PANEL WITH GFR  Result Value Ref Range   Glucose, Bld 123 (H) 65 - 99 mg/dL   BUN 16 7 - 25 mg/dL   Creat 0.99 0.50 - 1.10 mg/dL   GFR, Est Non African American 73 > OR = 60 mL/min/1.48m2   GFR, Est African American 85 > OR = 60 mL/min/1.62m2   BUN/Creatinine Ratio NOT APPLICABLE 6 - 22 (calc)  Sodium 146 135 - 146 mmol/L   Potassium 3.7 3.5 - 5.3 mmol/L   Chloride 106 98 - 110 mmol/L   CO2 33 (H) 20 - 32 mmol/L   Calcium 9.8 8.6 - 10.2 mg/dL   Total Protein 7.0 6.1 - 8.1 g/dL   Albumin 4.2 3.6 - 5.1 g/dL   Globulin 2.8 1.9 - 3.7 g/dL (calc)   AG Ratio 1.5 1.0 - 2.5 (calc)   Total Bilirubin 0.2 0.2 - 1.2 mg/dL   Alkaline phosphatase (APISO) 55 31 - 125 U/L   AST 11 10 - 30 U/L   ALT 13 6 - 29 U/L  Hepatitis C Antibody  Result Value Ref Range   Hepatitis C Ab NON-REACTIVE NON-REACTIVE   SIGNAL TO CUT-OFF 0.01 <1.00  Hemoglobin A1c  Result Value Ref Range   Hgb A1c MFr Bld 5.5 <5.7 % of total  Hgb   Mean Plasma Glucose 111 mg/dL   eAG (mmol/L) 6.2 mmol/L  Insulin, Free (Bioactive)  Result Value Ref Range   Insulin, Free 256.2 (H) 1.5 - 14.9 uIU/mL      Assessment & Plan:   1. Essential hypertension  - CBC with Differential/Platelet - COMPLETE METABOLIC PANEL WITH GFR - valsartan-hydrochlorothiazide (DIOVAN HCT) 160-12.5 MG tablet; Take 1 tablet by mouth daily.  Dispense: 90 tablet; Refill: 3 - amLODipine (NORVASC) 5 MG tablet; Take 1 tablet (5 mg total) by mouth daily.  Dispense: 90 tablet; Refill: 3 - metoprolol succinate (TOPROL XL) 50 MG 24 hr tablet; Take 1 tablet (50 mg total) by mouth daily. Take with or immediately following a meal.  Dispense: 90 tablet; Refill: 3  2. Morbid obesity with BMI of 45.0-49.9, adult (HCC)  - Hemoglobin A1c - Insulin Pen Needle (NOVOFINE PEN NEEDLE) 32G X 6 MM MISC; 50 each by Does not apply route as directed.  Dispense: 1 each; Refill: 1  3. Prediabetes  - Hemoglobin A1c - Insulin Pen Needle (NOVOFINE PEN NEEDLE) 32G X 6 MM MISC; 50 each by Does not apply route as directed.  Dispense: 1 each; Refill: 1  4. Insulin resistance  - metFORMIN (GLUCOPHAGE) 500 MG tablet; TAKE 1 TABLET(500 MG) BY MOUTH TWICE DAILY WITH A MEAL  Dispense: 180 tablet; Refill: 0  5. Metabolic syndrome  - COMPLETE METABOLIC PANEL WITH GFR - metFORMIN (GLUCOPHAGE) 500 MG tablet; TAKE 1 TABLET(500 MG) BY MOUTH TWICE DAILY WITH A MEAL  Dispense: 180 tablet; Refill: 0  6. Hyperlipidemia, unspecified hyperlipidemia type  - Lipid panel  7. Malignant neoplasm of upper-outer quadrant of right breast in female, estrogen receptor positive ([place])   8. Medication monitoring encounter  - CBC with Differential/Platelet - COMPLETE METABOLIC PANEL WITH GFR - Lipid panel - Hemoglobin A1c   Follow up plan: Return in about 6 months (around 09/17/2021) for follow up.

## 2021-03-20 NOTE — Telephone Encounter (Signed)
Requested Prescriptions  Pending Prescriptions Disp Refills  . metFORMIN (GLUCOPHAGE) 500 MG tablet [Pharmacy Med Name: METFORMIN $RemoveBeforeD'500MG'IxAUZVCHAKifGO$  TABLETS] 180 tablet 0    Sig: TAKE 1 TABLET(500 MG) BY MOUTH TWICE DAILY WITH A MEAL     Endocrinology:  Diabetes - Biguanides Failed - 03/20/2021  3:36 AM      Failed - HBA1C is between 0 and 7.9 and within 180 days    Hgb A1c MFr Bld  Date Value Ref Range Status  09/18/2020 5.5 <5.7 % of total Hgb Final    Comment:    For the purpose of screening for the presence of diabetes: . <5.7%       Consistent with the absence of diabetes 5.7-6.4%    Consistent with increased risk for diabetes             (prediabetes) > or =6.5%  Consistent with diabetes . This assay result is consistent with a decreased risk of diabetes. . Currently, no consensus exists regarding use of hemoglobin A1c for diagnosis of diabetes in children. . According to American Diabetes Association (ADA) guidelines, hemoglobin A1c <7.0% represents optimal control in non-pregnant diabetic patients. Different metrics may apply to specific patient populations.  Standards of Medical Care in Diabetes(ADA). .          Failed - Valid encounter within last 6 months    Recent Outpatient Visits          6 months ago Essential hypertension   [facility] Delsa Grana, PA-C   8 months ago Encounter for completion of form with patient   [facility] Myles Gip, DO   12 months ago Essential hypertension   [facility] Delsa Grana, PA-C   1 year ago Essential hypertension   [facility] Delsa Grana, PA-C   1 year ago Essential hypertension   [facility] Delsa Grana, [place]      Future Appointments            Today Bo Merino, [place] in normal range and within 360 days    Creat  Date Value Ref Range Status   09/18/2020 0.99 0.50 - 1.10 mg/dL Final   Creatinine, Urine  Date Value Ref Range Status  11/13/2015 <10 mg/dL Final         Passed - AA eGFR in normal range and within 360 days    GFR, Est African American  Date Value Ref Range Status  09/18/2020 85 > OR = 60 mL/min/1.70m2 Final   GFR, Est Non African American  Date Value Ref Range Status  09/18/2020 73 > OR = 60 mL/min/1.68m2 Final

## 2021-03-22 LAB — COMPLETE METABOLIC PANEL WITH GFR
AG Ratio: 1.4 (calc) (ref 1.0–2.5)
ALT: 11 U/L (ref 6–29)
AST: 12 U/L (ref 10–30)
Albumin: 4.2 g/dL (ref 3.6–5.1)
Alkaline phosphatase (APISO): 52 U/L (ref 31–125)
BUN: 11 mg/dL (ref 7–25)
CO2: 31 mmol/L (ref 20–32)
Calcium: 9.4 mg/dL (ref 8.6–10.2)
Chloride: 103 mmol/L (ref 98–110)
Creat: 0.74 mg/dL (ref 0.50–0.97)
Globulin: 3.1 g/dL (calc) (ref 1.9–3.7)
Glucose, Bld: 81 mg/dL (ref 65–99)
Potassium: 3.6 mmol/L (ref 3.5–5.3)
Sodium: 140 mmol/L (ref 135–146)
Total Bilirubin: 0.3 mg/dL (ref 0.2–1.2)
Total Protein: 7.3 g/dL (ref 6.1–8.1)
eGFR: 107 mL/min/{1.73_m2} (ref >=60)

## 2021-03-22 LAB — LIPID PANEL
Cholesterol: 213 mg/dL — ABNORMAL HIGH (ref <200)
HDL: 48 mg/dL — ABNORMAL LOW (ref >=50)
LDL Cholesterol (Calc): 143 mg/dL (calc) — ABNORMAL HIGH
Non-HDL Cholesterol (Calc): 165 mg/dL (calc) — ABNORMAL HIGH (ref <130)
Total CHOL/HDL Ratio: 4.4 (calc) (ref <5.0)
Triglycerides: 105 mg/dL (ref <150)

## 2021-03-22 LAB — CBC WITH DIFFERENTIAL/PLATELET
Absolute Monocytes: 350 cells/uL (ref 200–950)
Basophils Absolute: 62 cells/uL (ref 0–200)
Basophils Relative: 1.3 %
Eosinophils Absolute: 379 cells/uL (ref 15–500)
Eosinophils Relative: 7.9 %
HCT: 35.8 % (ref 35.0–45.0)
Hemoglobin: 12 g/dL (ref 11.7–15.5)
Lymphs Abs: 1262 cells/uL (ref 850–3900)
MCH: 27.7 pg (ref 27.0–33.0)
MCHC: 33.5 g/dL (ref 32.0–36.0)
MCV: 82.7 fL (ref 80.0–100.0)
MPV: 8.8 fL (ref 7.5–12.5)
Monocytes Relative: 7.3 %
Neutro Abs: 2746 cells/uL (ref 1500–7800)
Neutrophils Relative %: 57.2 %
Platelets: 497 10*3/uL — ABNORMAL HIGH (ref 140–400)
RBC: 4.33 10*6/uL (ref 3.80–5.10)
RDW: 14.8 % (ref 11.0–15.0)
Total Lymphocyte: 26.3 %
WBC: 4.8 10*3/uL (ref 3.8–10.8)

## 2021-03-22 LAB — HEMOGLOBIN A1C
Hgb A1c MFr Bld: 5.2 % of total Hgb (ref <5.7)
Mean Plasma Glucose: 103 mg/dL
eAG (mmol/L): 5.7 mmol/L

## 2021-03-31 DIAGNOSIS — D582 Other hemoglobinopathies: Secondary | ICD-10-CM | POA: Diagnosis not present

## 2021-03-31 DIAGNOSIS — I10 Essential (primary) hypertension: Secondary | ICD-10-CM | POA: Diagnosis not present

## 2021-03-31 DIAGNOSIS — Z79811 Long term (current) use of aromatase inhibitors: Secondary | ICD-10-CM | POA: Diagnosis not present

## 2021-03-31 DIAGNOSIS — Z9011 Acquired absence of right breast and nipple: Secondary | ICD-10-CM | POA: Diagnosis not present

## 2021-03-31 DIAGNOSIS — Z170 Estrogen receptor positive status [ER+]: Secondary | ICD-10-CM | POA: Diagnosis not present

## 2021-03-31 DIAGNOSIS — E559 Vitamin D deficiency, unspecified: Secondary | ICD-10-CM | POA: Diagnosis not present

## 2021-03-31 DIAGNOSIS — G43909 Migraine, unspecified, not intractable, without status migrainosus: Secondary | ICD-10-CM | POA: Diagnosis not present

## 2021-03-31 DIAGNOSIS — I890 Lymphedema, not elsewhere classified: Secondary | ICD-10-CM | POA: Diagnosis not present

## 2021-03-31 DIAGNOSIS — E876 Hypokalemia: Secondary | ICD-10-CM | POA: Diagnosis not present

## 2021-03-31 DIAGNOSIS — E785 Hyperlipidemia, unspecified: Secondary | ICD-10-CM | POA: Diagnosis not present

## 2021-03-31 DIAGNOSIS — C50811 Malignant neoplasm of overlapping sites of right female breast: Secondary | ICD-10-CM | POA: Diagnosis not present

## 2021-03-31 DIAGNOSIS — Z780 Asymptomatic menopausal state: Secondary | ICD-10-CM | POA: Diagnosis not present

## 2021-03-31 DIAGNOSIS — C778 Secondary and unspecified malignant neoplasm of lymph nodes of multiple regions: Secondary | ICD-10-CM | POA: Diagnosis not present

## 2021-03-31 DIAGNOSIS — M25569 Pain in unspecified knee: Secondary | ICD-10-CM | POA: Diagnosis not present

## 2021-03-31 DIAGNOSIS — G629 Polyneuropathy, unspecified: Secondary | ICD-10-CM | POA: Diagnosis not present

## 2021-03-31 DIAGNOSIS — M8588 Other specified disorders of bone density and structure, other site: Secondary | ICD-10-CM | POA: Diagnosis not present

## 2021-03-31 DIAGNOSIS — Z79899 Other long term (current) drug therapy: Secondary | ICD-10-CM | POA: Diagnosis not present

## 2021-04-08 ENCOUNTER — Encounter: Payer: Self-pay | Admitting: Family Medicine

## 2021-04-09 NOTE — Telephone Encounter (Signed)
Printed and is up front for pick up

## 2021-04-09 NOTE — Telephone Encounter (Signed)
Copied from [place] [id number]. Topic: Quick Communication - See Telephone Encounter >> Apr 09, 2021 10:11 AM Loma Boston wrote: CRM for notification. See Telephone encounter for: 04/09/21. 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

## 2021-06-19 ENCOUNTER — Other Ambulatory Visit: Payer: Self-pay | Admitting: Family Medicine

## 2021-06-19 DIAGNOSIS — E8881 Metabolic syndrome: Secondary | ICD-10-CM

## 2021-06-19 DIAGNOSIS — Z6841 Body Mass Index (BMI) 40.0 and over, adult: Secondary | ICD-10-CM

## 2021-06-19 DIAGNOSIS — R7303 Prediabetes: Secondary | ICD-10-CM

## 2021-08-28 ENCOUNTER — Other Ambulatory Visit: Payer: Self-pay | Admitting: Family Medicine

## 2021-08-28 DIAGNOSIS — I10 Essential (primary) hypertension: Secondary | ICD-10-CM

## 2021-09-17 ENCOUNTER — Other Ambulatory Visit: Payer: Self-pay | Admitting: Family Medicine

## 2021-09-17 DIAGNOSIS — I10 Essential (primary) hypertension: Secondary | ICD-10-CM

## 2021-09-18 ENCOUNTER — Encounter: Payer: Self-pay | Admitting: Nurse Practitioner

## 2021-09-18 ENCOUNTER — Other Ambulatory Visit: Payer: Self-pay

## 2021-09-18 ENCOUNTER — Ambulatory Visit (INDEPENDENT_AMBULATORY_CARE_PROVIDER_SITE_OTHER): Payer: 59 | Admitting: Nurse Practitioner

## 2021-09-18 VITALS — BP 126/82 | HR 100 | Temp 98.1°F | Resp 18 | Ht 65.0 in | Wt 301.7 lb

## 2021-09-18 DIAGNOSIS — I10 Essential (primary) hypertension: Secondary | ICD-10-CM | POA: Diagnosis not present

## 2021-09-18 DIAGNOSIS — R7303 Prediabetes: Secondary | ICD-10-CM

## 2021-09-18 DIAGNOSIS — Z6841 Body Mass Index (BMI) 40.0 and over, adult: Secondary | ICD-10-CM

## 2021-09-18 DIAGNOSIS — Z5181 Encounter for therapeutic drug level monitoring: Secondary | ICD-10-CM

## 2021-09-18 DIAGNOSIS — E785 Hyperlipidemia, unspecified: Secondary | ICD-10-CM

## 2021-09-18 DIAGNOSIS — E8881 Metabolic syndrome: Secondary | ICD-10-CM | POA: Diagnosis not present

## 2021-09-18 MED ORDER — SEMAGLUTIDE-WEIGHT MANAGEMENT 1 MG/0.5ML ~~LOC~~ SOAJ: 1.0000 mg | SUBCUTANEOUS | 0 refills | Status: DC

## 2021-09-18 MED ORDER — METFORMIN HCL 500 MG PO TABS: 500.0000 mg | ORAL_TABLET | Freq: Two times a day (BID) | ORAL | 1 refills | Status: DC

## 2021-09-18 NOTE — Assessment & Plan Note (Signed)
Continue taking metformin 500 mg twice daily and switching from Victoza 1.8 mg daily to [facility] 1 mg every week.  ?

## 2021-09-18 NOTE — Assessment & Plan Note (Signed)
Continue taking metformin 500 mg twice daily and switching from Victoza 1.8 mg daily to [facility] 1 mg every week. ?

## 2021-09-18 NOTE — Assessment & Plan Note (Signed)
Not currently on medication, getting labs today. ?

## 2021-09-18 NOTE — Assessment & Plan Note (Signed)
Continue taking valsartan-hydrochlorothiazide 160-12.5 mg daily and amlodipine 5 mg daily blood pressure is currently at goal. ?

## 2021-09-18 NOTE — Progress Notes (Signed)
? ?BP 126/82   Pulse 100   Temp 98.1 ?F (36.7 ?C) (Oral)   Resp 18   Ht $R'5\' 5"'KL$  (1.651 m)   Wt (!) 301 lb 11.2 oz (136.9 kg)   LMP 02/08/2018 (Exact Date)   SpO2 96%   BMI 50.21 kg/m?   ? ?Subjective:  ? ? Patient ID: Mckenzie Lopez, female    DOB: 1985/10/31, 36 y.o.   MRN: [id number] ? ?HPI: ?Mckenzie Lopez is a 36 y.o. female ? ?Chief Complaint  ?Patient presents with  ? Follow-up  ? Hypertension  ? ?HTN: She says her blood pressure has been fine when she is checked it at work.  Her blood pressure today is 126/82.  She denies any chest pain, shortness of breath, headaches or blurred vision.  She is currently taking valsartan-hydrochlorothiazide 160-12.5 mg daily and amlodipine 5 mg daily ? ?Prediabetes: She is currently taking metformin 500 mg twice daily.  She denies any polyuria, polydipsia, or polyphasia.  We will get labs today her last A1c was 5.2 on 03/20/2021.  Her last elevated reading was on March 21, 2020 at 5.8. ? ?Hyperlipidemia: Her LDL was 143 on 03/20/2021.  She is not currently on medication.  We will get labs today. ? ?Obesity/metabolic syndrome/insulin resistance: She has been taking metformin 500 mg twice a day, and Victoza 1.8 mg subcu daily.  She would like to switch to [facility].  Discussed administration.  She will start at [facility] 1 mg every week.  Her current weight is 301 pounds with a BMI of 50.21.  She says she has been trying to eat healthier and get physical activity and. ? ?Relevant past medical, surgical, family and social history reviewed and updated as indicated. Interim medical history since our last visit reviewed. ?Allergies and medications reviewed and updated. ? ?Review of Systems ? ?Constitutional: Negative for fever or weight change.  ?Respiratory: Negative for cough and shortness of breath.   ?Cardiovascular: Negative for chest pain or palpitations.  ?Gastrointestinal: Negative for abdominal pain, no bowel changes.  ?Musculoskeletal: Negative for gait  problem or joint swelling.  ?Skin: Negative for rash.  ?Neurological: Negative for dizziness or headache.  ?No other specific complaints in a complete review of systems (except as listed in HPI above).  ? ?   ?Objective:  ?  ?BP 126/82   Pulse 100   Temp 98.1 ?F (36.7 ?C) (Oral)   Resp 18   Ht $R'5\' 5"'XZ$  (1.651 m)   Wt (!) 301 lb 11.2 oz (136.9 kg)   LMP 02/08/2018 (Exact Date)   SpO2 96%   BMI 50.21 kg/m?   ?Wt Readings from Last 3 Encounters:  ?09/18/21 (!) 301 lb 11.2 oz (136.9 kg)  ?03/20/21 289 lb (131.1 kg)  ?11/15/20 296 lb (134.3 kg)  ?  ?Physical Exam ? ?Constitutional: Patient appears well-developed and well-nourished. Obese  No distress.  ?HEENT: head atraumatic, normocephalic, pupils equal and reactive to light,  neck supple ?Cardiovascular: Normal rate, regular rhythm and normal heart sounds.  No murmur heard. No BLE edema. ?Pulmonary/Chest: Effort normal and breath sounds normal. No respiratory distress. ?Abdominal: Soft.  There is no tenderness. ?Psychiatric: Patient has a normal mood and affect. behavior is normal. Judgment and thought content normal.  ?Results for orders placed or performed in visit on 03/20/21  ?CBC with Differential/Platelet  ?Result Value Ref Range  ? WBC 4.8 3.8 - 10.8 Thousand/uL  ? RBC 4.33 3.80 - 5.10 Million/uL  ? Hemoglobin 12.0 11.7 - 15.5 g/dL  ?  HCT 35.8 35.0 - 45.0 %  ? MCV 82.7 80.0 - 100.0 fL  ? MCH 27.7 27.0 - 33.0 pg  ? MCHC 33.5 32.0 - 36.0 g/dL  ? RDW 14.8 11.0 - 15.0 %  ? Platelets 497 (H) 140 - 400 Thousand/uL  ? MPV 8.8 7.5 - 12.5 fL  ? Neutro Abs 2,746 1,500 - 7,800 cells/uL  ? Lymphs Abs 1,262 850 - 3,900 cells/uL  ? Absolute Monocytes 350 200 - 950 cells/uL  ? Eosinophils Absolute 379 15 - 500 cells/uL  ? Basophils Absolute 62 0 - 200 cells/uL  ? Neutrophils Relative % 57.2 %  ? Total Lymphocyte 26.3 %  ? Monocytes Relative 7.3 %  ? Eosinophils Relative 7.9 %  ? Basophils Relative 1.3 %  ?COMPLETE METABOLIC PANEL WITH GFR  ?Result Value Ref Range  ?  Glucose, Bld 81 65 - 99 mg/dL  ? BUN 11 7 - 25 mg/dL  ? Creat 0.74 0.50 - 0.97 mg/dL  ? eGFR 107 > OR = 60 mL/min/1.60m2  ? BUN/Creatinine Ratio NOT APPLICABLE 6 - 22 (calc)  ? Sodium 140 135 - 146 mmol/L  ? Potassium 3.6 3.5 - 5.3 mmol/L  ? Chloride 103 98 - 110 mmol/L  ? CO2 31 20 - 32 mmol/L  ? Calcium 9.4 8.6 - 10.2 mg/dL  ? Total Protein 7.3 6.1 - 8.1 g/dL  ? Albumin 4.2 3.6 - 5.1 g/dL  ? Globulin 3.1 1.9 - 3.7 g/dL (calc)  ? AG Ratio 1.4 1.0 - 2.5 (calc)  ? Total Bilirubin 0.3 0.2 - 1.2 mg/dL  ? Alkaline phosphatase (APISO) 52 31 - 125 U/L  ? AST 12 10 - 30 U/L  ? ALT 11 6 - 29 U/L  ?Lipid panel  ?Result Value Ref Range  ? Cholesterol 213 (H) <200 mg/dL  ? HDL 48 (L) > OR = 50 mg/dL  ? Triglycerides 105 <150 mg/dL  ? LDL Cholesterol (Calc) 143 (H) mg/dL (calc)  ? Total CHOL/HDL Ratio 4.4 <5.0 (calc)  ? Non-HDL Cholesterol (Calc) 165 (H) <130 mg/dL (calc)  ?Hemoglobin A1c  ?Result Value Ref Range  ? Hgb A1c MFr Bld 5.2 <5.7 % of total Hgb  ? Mean Plasma Glucose 103 mg/dL  ? eAG (mmol/L) 5.7 mmol/L  ? ?   ?Assessment & Plan:  ? ?Problem List Items Addressed This Visit   ? ?  ? Cardiovascular and Mediastinum  ? Essential hypertension - Primary  ?  Continue taking valsartan-hydrochlorothiazide 160-12.5 mg daily and amlodipine 5 mg daily blood pressure is currently at goal. ? ?  ?  ? Relevant Orders  ? CBC with Differential/Platelet  ? COMPLETE METABOLIC PANEL WITH GFR  ?  ? Endocrine  ? Insulin resistance  ?  Continue taking metformin 500 mg twice daily and switching from Victoza 1.8 mg daily to [facility] 1 mg every week. ? ?  ?  ? Relevant Medications  ? metFORMIN (GLUCOPHAGE) 500 MG tablet  ? Semaglutide-Weight Management 1 MG/0.5ML SOAJ (Start on 11/15/2021)  ?  ? Other  ? Morbid obesity with BMI of 50.0-59.9, adult ([place])  ?  Continue taking metformin 500 mg twice daily and switching from Victoza 1.8 mg daily to [facility] 1 mg every week. ? ?  ?  ? Relevant Medications  ? metFORMIN (GLUCOPHAGE) 500 MG tablet  ?  Semaglutide-Weight Management 1 MG/0.5ML SOAJ (Start on 11/15/2021)  ? Other Relevant Orders  ? Lipid panel  ? CBC with Differential/Platelet  ? COMPLETE METABOLIC PANEL WITH  GFR  ? Hemoglobin A1c  ? Hyperlipidemia  ?  Not currently on medication, getting labs today. ? ?  ?  ? Prediabetes  ?  Continue taking metformin 500 mg twice daily and switching from Victoza 1.8 mg daily to [facility] 1 mg every week.  ? ?  ?  ? Relevant Orders  ? Hemoglobin A1c  ? ?Other Visit Diagnoses   ? ? Medication monitoring encounter      ? ?  ?  ? ?Follow up plan: ?Return in about 6 months (around 03/21/2022) for follow up. ? ? ? ? ? ?

## 2021-09-18 NOTE — Assessment & Plan Note (Signed)
Continue taking metformin 500 mg twice daily and switching from Victoza 1.8 mg daily to [facility] 1 mg every week. ?

## 2021-09-19 LAB — LIPID PANEL
Cholesterol: 201 mg/dL — ABNORMAL HIGH (ref <200)
HDL: 50 mg/dL (ref >=50)
LDL Cholesterol (Calc): 124 mg/dL (calc) — ABNORMAL HIGH
Non-HDL Cholesterol (Calc): 151 mg/dL (calc) — ABNORMAL HIGH (ref <130)
Total CHOL/HDL Ratio: 4 (calc) (ref <5.0)
Triglycerides: 155 mg/dL — ABNORMAL HIGH (ref <150)

## 2021-09-19 LAB — CBC WITH DIFFERENTIAL/PLATELET
Absolute Monocytes: 238 cells/uL (ref 200–950)
Basophils Absolute: 40 cells/uL (ref 0–200)
Basophils Relative: 0.9 %
Eosinophils Absolute: 158 cells/uL (ref 15–500)
Eosinophils Relative: 3.6 %
HCT: 35 % (ref 35.0–45.0)
Hemoglobin: 12.2 g/dL (ref 11.7–15.5)
Lymphs Abs: 1210 cells/uL (ref 850–3900)
MCH: 29 pg (ref 27.0–33.0)
MCHC: 34.9 g/dL (ref 32.0–36.0)
MCV: 83.1 fL (ref 80.0–100.0)
MPV: 8.9 fL (ref 7.5–12.5)
Monocytes Relative: 5.4 %
Neutro Abs: 2754 cells/uL (ref 1500–7800)
Neutrophils Relative %: 62.6 %
Platelets: 505 10*3/uL — ABNORMAL HIGH (ref 140–400)
RBC: 4.21 10*6/uL (ref 3.80–5.10)
RDW: 14.6 % (ref 11.0–15.0)
Total Lymphocyte: 27.5 %
WBC: 4.4 10*3/uL (ref 3.8–10.8)

## 2021-09-19 LAB — COMPLETE METABOLIC PANEL WITH GFR
AG Ratio: 1.6 (calc) (ref 1.0–2.5)
ALT: 11 U/L (ref 6–29)
AST: 11 U/L (ref 10–30)
Albumin: 4.4 g/dL (ref 3.6–5.1)
Alkaline phosphatase (APISO): 55 U/L (ref 31–125)
BUN: 11 mg/dL (ref 7–25)
CO2: 27 mmol/L (ref 20–32)
Calcium: 9.5 mg/dL (ref 8.6–10.2)
Chloride: 105 mmol/L (ref 98–110)
Creat: 0.79 mg/dL (ref 0.50–0.97)
Globulin: 2.8 g/dL (calc) (ref 1.9–3.7)
Glucose, Bld: 100 mg/dL — ABNORMAL HIGH (ref 65–99)
Potassium: 3.8 mmol/L (ref 3.5–5.3)
Sodium: 141 mmol/L (ref 135–146)
Total Bilirubin: 0.3 mg/dL (ref 0.2–1.2)
Total Protein: 7.2 g/dL (ref 6.1–8.1)
eGFR: 99 mL/min/{1.73_m2} (ref >=60)

## 2021-09-19 LAB — HEMOGLOBIN A1C
Hgb A1c MFr Bld: 5.4 % of total Hgb (ref <5.7)
Mean Plasma Glucose: 108 mg/dL
eAG (mmol/L): 6 mmol/L

## 2021-09-23 ENCOUNTER — Telehealth: Payer: Self-pay | Admitting: Emergency Medicine

## 2021-09-23 NOTE — Telephone Encounter (Signed)
Called and spoke to [employer] and did PA over the phone for this patient. (FT#[id number]) [facility] authorization is under review will fax information in48 to 72 hours when decision is made ?

## 2021-09-29 DIAGNOSIS — C50811 Malignant neoplasm of overlapping sites of right female breast: Secondary | ICD-10-CM | POA: Diagnosis not present

## 2021-09-29 DIAGNOSIS — M8588 Other specified disorders of bone density and structure, other site: Secondary | ICD-10-CM | POA: Diagnosis not present

## 2021-09-29 DIAGNOSIS — E876 Hypokalemia: Secondary | ICD-10-CM | POA: Diagnosis not present

## 2021-09-29 DIAGNOSIS — Z170 Estrogen receptor positive status [ER+]: Secondary | ICD-10-CM | POA: Diagnosis not present

## 2021-09-29 DIAGNOSIS — Z79811 Long term (current) use of aromatase inhibitors: Secondary | ICD-10-CM | POA: Diagnosis not present

## 2021-09-29 DIAGNOSIS — Z9011 Acquired absence of right breast and nipple: Secondary | ICD-10-CM | POA: Diagnosis not present

## 2021-09-29 DIAGNOSIS — Z9189 Other specified personal risk factors, not elsewhere classified: Secondary | ICD-10-CM | POA: Diagnosis not present

## 2021-09-29 DIAGNOSIS — Z79899 Other long term (current) drug therapy: Secondary | ICD-10-CM | POA: Diagnosis not present

## 2021-09-29 DIAGNOSIS — G629 Polyneuropathy, unspecified: Secondary | ICD-10-CM | POA: Diagnosis not present

## 2021-09-29 DIAGNOSIS — I890 Lymphedema, not elsewhere classified: Secondary | ICD-10-CM | POA: Diagnosis not present

## 2021-10-06 ENCOUNTER — Other Ambulatory Visit: Payer: Self-pay | Admitting: Nurse Practitioner

## 2021-10-07 ENCOUNTER — Encounter: Payer: Self-pay | Admitting: Nurse Practitioner

## 2021-10-08 ENCOUNTER — Other Ambulatory Visit: Payer: Self-pay | Admitting: Nurse Practitioner

## 2021-10-08 DIAGNOSIS — R7303 Prediabetes: Secondary | ICD-10-CM

## 2021-10-08 MED ORDER — NOVOFINE PEN NEEDLE 32G X 6 MM MISC: 50.0000 | 1 refills | Status: DC

## 2021-10-08 MED ORDER — VICTOZA 18 MG/3ML ~~LOC~~ SOPN: PEN_INJECTOR | SUBCUTANEOUS | 3 refills | Status: DC

## 2021-10-08 NOTE — Telephone Encounter (Signed)
Pharmacy requesting new order for B-D brand of pen needles. Routing to office.   Requested Prescriptions  Pending Prescriptions Disp Refills   B-D UF III MINI PEN NEEDLES 31G X 5 MM MISC [Pharmacy Med Name: B-D PEN NDL MINI 31GX5MM(3/16)PRPL] 100 each     Sig: USE AS DIRECTED     Endocrinology: Diabetes - Testing Supplies Passed - 10/06/2021  2:57 PM      Passed - Valid encounter within last 12 months    Recent Outpatient Visits           2 weeks ago Essential hypertension   [facility] Bo Merino, FNP   6 months ago Essential hypertension   [facility] Bo Merino, FNP   1 year ago Essential hypertension   [facility] Delsa Grana, PA-C   1 year ago Encounter for completion of form with patient   [facility] Myles Gip, DO   1 year ago Essential hypertension   [facility] Delsa Grana, PA-C       Future Appointments             In 5 months Reece Packer, Myna Hidalgo, [facility], [facility]

## 2021-10-22 ENCOUNTER — Encounter: Payer: Self-pay | Admitting: Nurse Practitioner

## 2021-10-27 ENCOUNTER — Other Ambulatory Visit: Payer: Self-pay

## 2021-10-27 DIAGNOSIS — R7303 Prediabetes: Secondary | ICD-10-CM

## 2021-10-27 MED ORDER — VICTOZA 18 MG/3ML ~~LOC~~ SOPN: PEN_INJECTOR | SUBCUTANEOUS | 3 refills | Status: DC

## 2021-10-29 ENCOUNTER — Other Ambulatory Visit: Payer: Self-pay

## 2021-10-29 MED ORDER — ANASTROZOLE 1 MG PO TABS
ORAL_TABLET | ORAL | 4 refills | Status: DC
  Filled 2021-10-29 – 2021-11-03 (×2): qty 90, 90d supply, fill #0
  Filled 2022-02-11: qty 90, 90d supply, fill #1
  Filled 2022-05-08: qty 90, 90d supply, fill #2
  Filled 2022-08-06: qty 90, 90d supply, fill #3

## 2021-10-30 ENCOUNTER — Other Ambulatory Visit: Payer: Self-pay

## 2021-11-03 ENCOUNTER — Other Ambulatory Visit: Payer: Self-pay

## 2021-11-17 ENCOUNTER — Ambulatory Visit: Payer: 59 | Admitting: Obstetrics and Gynecology

## 2021-11-18 ENCOUNTER — Ambulatory Visit: Payer: 59 | Admitting: Obstetrics and Gynecology

## 2021-11-30 NOTE — Progress Notes (Unsigned)
No chief complaint on file.    HPI:      Mckenzie Lopez is a 36 y.o. G1P1001 who LMP was Patient's last menstrual period was 02/08/2018 (exact date)., presents today for her annual examination. Her menses are absent since 10/19 chemo start and now s/p lap hyst BSO 7/20.  Has tolerable vasomotor sx with arimidex, already on gabapentin. No PMB.   Sex activity: not sexually active; no vag sx. Last Pap: 11/08/18  Results were: no abnormalities /neg HPV DNA; s/p lap hyst BSO 7/20 Hx of STDs: chlamydia, CIN 1 in 2014   Last mammo: 01/06/21 LT side at [facility]; normal; repeat due in 12 months. Mammo 11/26/17 was Birads-5. Bx with Dr. Lemar Livings showed RT intraductal carcinoma with mets to axillary lymph nodes. Did RT mastectomy and chemo at [facility], now on arimidex for 10 yrs (changed from femara due to knee pain). 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 Lemar Livings; Vistaseq panel testing done 6/20 was negative.   There is a FH of breast cancer in her MGM and mat aunt, genetic testing not done by them. Pt is BRCA neg/Vistaseq panel neg 6/20. There is no FH of ovarian cancer. The patient does do self-breast exams.    Tobacco use: The patient denies current or previous tobacco use. Alcohol use: none No drug use Exercise: moderately active   She does get adequate calcium and Vitamin D in her diet (on Rx).   She has her labs managed with PCP.  [facility] 2021 and 20[facility] at [facility]. Osteoporosis in spine, normal hip.    Past Medical History:  Diagnosis Date   BRCA negative 2019   BRCA with Dr. Lemar Livings, 7/20 Vistaseq panel neg with Althea Grimmer, PA-C   CHF (congestive heart failure) (HCC)    POSTPARTUM 2017/ RESOLVED   Eczema    Hemoglobin C trait (HCC) 11/29/2015   Hyperlipidemia    Hypertension    IFG (impaired fasting glucose)    Intraductal carcinoma of right breast 11/2017   ER/PR+; Her2neu neg; mets to LN   Low back pain    Migraines    Morbid obesity (HCC)    Premature surgical menopause  11/2018    Past Surgical History:  Procedure Laterality Date   ABDOMINAL HYSTERECTOMY  11/28/2018   Full   AXILLARY LYMPH NODE BIOPSY Right 12/01/2017   METASTATIC MAMMARY CARCINOMA   AXILLARY LYMPH NODE BIOPSY Left 12/01/2017   NEGATIVE FOR MALIGNANCY   BREAST BIOPSY Right 12/01/2017   11 and 12 o'clock, INVASIVE MAMMARY CARCINOMA, ER/PR positive HER2 negative   MASTECTOMY Right 01/05/2018    Family History  Problem Relation Age of Onset   Diabetes Mother    Hypertension Mother    Thyroid disease Mother    Breast cancer Maternal Grandmother        ?age   Diabetes Maternal Grandfather    Hypertension Maternal Grandfather    Seizures Maternal Grandfather    Breast cancer Maternal Aunt        early 39's   Diabetes Sister    Diabetes Paternal Grandmother    Heart disease Neg Hx    Stroke Neg Hx    COPD Neg Hx    Ovarian cancer Neg Hx     Social History   Socioeconomic History   Marital status: Single    Spouse name: Not on file   Number of children: Not on file   Years of education: Not on file   Highest education level: Not  on file  Occupational History   Not on file  Tobacco Use   Smoking status: Never   Smokeless tobacco: Never  Vaping Use   Vaping Use: Never used  Substance and Sexual Activity   Alcohol use: No   Drug use: No   Sexual activity: Not Currently    Birth control/protection: None, Surgical    Comment: Hysterectomy  Other Topics Concern   Not on file  Social History Narrative   Not on file   Social Determinants of Health   Financial Resource Strain: Not on file  Food Insecurity: Not on file  Transportation Needs: Not on file  Physical Activity: Not on file  Stress: Not on file  Social Connections: Not on file  Intimate Partner Violence: Not on file    Current Outpatient Medications on File Prior to Visit  Medication Sig Dispense Refill   amLODipine (NORVASC) 5 MG tablet Take 1 tablet (5 mg total) by mouth daily. 90 tablet 3    anastrozole (ARIMIDEX) 1 MG tablet Take by mouth.     anastrozole (ARIMIDEX) 1 MG tablet Take 1 tablet by mouth daily 90 tablet 4   calcium carbonate (TUMS - DOSED IN MG ELEMENTAL CALCIUM) 500 MG chewable tablet Chew 1 tablet by mouth daily. (Patient not taking: Reported on 09/18/2021)     DULoxetine (CYMBALTA) 30 MG capsule Take 90 mg by mouth daily.     ergocalciferol (VITAMIN D2) 1.25 MG (50000 UT) capsule Take 1 capsule by mouth once a week.     fluticasone (FLONASE) 50 MCG/ACT nasal spray SHAKE LIQUID AND USE 2 SPRAYS IN EACH NOSTRIL DAILY AS NEEDED FOR ALLERGIES 48 g 0   gabapentin (NEURONTIN) 300 MG capsule Take 300 mg by mouth 3 (three) times daily.     Insulin Pen Needle (NOVOFINE PEN NEEDLE) 32G X 6 MM MISC 50 each by Does not apply route as directed. 1 each 1   liraglutide (VICTOZA) 18 MG/3ML SOPN 1.8 mg SQ once a day 6 mL 3   loratadine (CLARITIN) 10 MG tablet Take 10 mg by mouth daily.     magnesium oxide (MAG-OX) 400 MG tablet Take by mouth.     metFORMIN (GLUCOPHAGE) 500 MG tablet Take 1 tablet (500 mg total) by mouth 2 (two) times daily with a meal. 180 tablet 1   metoprolol succinate (TOPROL XL) 50 MG 24 hr tablet Take 1 tablet (50 mg total) by mouth daily. Take with or immediately following a meal. 90 tablet 3   Olopatadine HCl 0.2 % SOLN One drop in each eye daily 2.5 mL 11   Potassium Chloride 40 MEQ/15ML (20%) SOLN Take 15 mLs by mouth daily.     valsartan-hydrochlorothiazide (DIOVAN HCT) 160-12.5 MG tablet Take 1 tablet by mouth daily. 90 tablet 3   vitamin B-12 (CYANOCOBALAMIN) 100 MCG tablet Take 100 mcg by mouth daily.     No current facility-administered medications on file prior to visit.    ROS:  Review of Systems  Constitutional:  Negative for fatigue, fever and unexpected weight change.  Respiratory:  Negative for cough, shortness of breath and wheezing.   Cardiovascular:  Negative for chest pain, palpitations and leg swelling.  Gastrointestinal:  Negative for  blood in stool, constipation, diarrhea, nausea and vomiting.  Endocrine: Negative for cold intolerance, heat intolerance and polyuria.  Genitourinary:  Negative for dyspareunia, dysuria, flank pain, frequency, genital sores, hematuria, menstrual problem, pelvic pain, urgency, vaginal bleeding, vaginal discharge and vaginal pain.  Musculoskeletal:  Positive for  arthralgias and joint swelling. Negative for back pain and myalgias.  Skin:  Negative for rash.  Neurological:  Positive for numbness. Negative for dizziness, syncope, light-headedness and headaches.  Hematological:  Negative for adenopathy.  Psychiatric/Behavioral:  Negative for agitation, confusion, sleep disturbance and suicidal ideas. The patient is not nervous/anxious.      Objective: LMP 02/08/2018 (Exact Date)    Physical Exam Constitutional:      Appearance: She is well-developed.  Genitourinary:     Vulva normal.     Genitourinary Comments: UTERUS/CX SURG REM     Right Labia: No rash, tenderness or lesions.    Left Labia: No tenderness, lesions or rash.    Vaginal cuff intact.    No vaginal discharge, erythema or tenderness.      Right Adnexa: not tender and no mass present.    Left Adnexa: not tender and no mass present.    Cervix is absent.     No cervical motion tenderness or polyp.     Uterus is not enlarged or tender.     Uterus is absent.  Breasts:    Right: Absent. No mass, nipple discharge, skin change or tenderness.     Left: No mass, nipple discharge, skin change or tenderness.  Neck:     Thyroid: No thyromegaly.  Cardiovascular:     Rate and Rhythm: Normal rate and regular rhythm.     Heart sounds: Normal heart sounds. No murmur heard. Pulmonary:     Effort: Pulmonary effort is normal.     Breath sounds: Normal breath sounds.  Chest:     Comments: RT BREAST ABSENT Abdominal:     Palpations: Abdomen is soft.     Tenderness: There is no abdominal tenderness. There is no guarding.   Musculoskeletal:        General: Normal range of motion.     Cervical back: Normal range of motion.  Neurological:     General: No focal deficit present.     Mental Status: She is alert and oriented to person, place, and time.     Cranial Nerves: No cranial nerve deficit.  Skin:    General: Skin is warm and dry.  Psychiatric:        Mood and Affect: Mood normal.        Behavior: Behavior normal.        Thought Content: Thought content normal.        Judgment: Judgment normal.  Vitals reviewed.     Assessment/Plan: Encounter for annual routine gynecological examination  Malignant neoplasm of upper-outer quadrant of right breast in female, estrogen receptor positive (HCC)--managed at [facility]. Doing arimidex for 10 yrs. Doing well.   Premature surgical menopause--cont ca/Vit D/exercise. Current on DEXA. Can't have hormones.   GYN counsel breast self exam, mammography screening, adequate intake of calcium and vitamin D, diet and exercise     F/U  No follow-ups on file.  Yesly Gerety B. Jamison Soward, PA-C 11/30/2021 9:44 PM

## 2021-12-04 ENCOUNTER — Other Ambulatory Visit (HOSPITAL_COMMUNITY)
Admission: RE | Admit: 2021-12-04 | Discharge: 2021-12-04 | Disposition: A | Discharge status: Home / Self Care | Payer: 59 | Source: Ambulatory Visit | Attending: Obstetrics and Gynecology | Admitting: Obstetrics and Gynecology

## 2021-12-04 ENCOUNTER — Ambulatory Visit (INDEPENDENT_AMBULATORY_CARE_PROVIDER_SITE_OTHER): Payer: 59 | Admitting: Obstetrics and Gynecology

## 2021-12-04 ENCOUNTER — Encounter: Payer: Self-pay | Admitting: Obstetrics and Gynecology

## 2021-12-04 VITALS — BP 120/80 | Ht 65.0 in | Wt 309.0 lb

## 2021-12-04 DIAGNOSIS — Z1151 Encounter for screening for human papillomavirus (HPV): Secondary | ICD-10-CM | POA: Diagnosis present

## 2021-12-04 DIAGNOSIS — Z170 Estrogen receptor positive status [ER+]: Secondary | ICD-10-CM

## 2021-12-04 DIAGNOSIS — Z01419 Encounter for gynecological examination (general) (routine) without abnormal findings: Secondary | ICD-10-CM | POA: Diagnosis not present

## 2021-12-04 DIAGNOSIS — Z1231 Encounter for screening mammogram for malignant neoplasm of breast: Secondary | ICD-10-CM

## 2021-12-04 DIAGNOSIS — Z124 Encounter for screening for malignant neoplasm of cervix: Secondary | ICD-10-CM | POA: Diagnosis not present

## 2021-12-04 DIAGNOSIS — C50411 Malignant neoplasm of upper-outer quadrant of right female breast: Secondary | ICD-10-CM

## 2021-12-04 NOTE — Patient Instructions (Signed)
I value your feedback and you entrusting us with your care. If you get a [facility] patient survey, I would appreciate you taking the time to let us know about your experience today. Thank you! ? ? ?

## 2021-12-08 LAB — CYTOLOGY - PAP
Comment: NEGATIVE
Diagnosis: NEGATIVE
High risk HPV: NEGATIVE

## 2021-12-25 ENCOUNTER — Other Ambulatory Visit: Payer: Self-pay | Admitting: Nurse Practitioner

## 2021-12-25 DIAGNOSIS — R7303 Prediabetes: Secondary | ICD-10-CM

## 2021-12-25 NOTE — Telephone Encounter (Signed)
Requested Prescriptions  Pending Prescriptions Disp Refills  . BD PEN NEEDLE NANO 2ND GEN 32G X 4 MM MISC [Pharmacy Med Name: B-D NANO 2ND GEN PEN NDL [id number]] 100 each     Sig: USE AS DIRECTED     Endocrinology: Diabetes - Testing Supplies Passed - 12/25/2021  3:35 AM      Passed - Valid encounter within last 12 months    Recent Outpatient Visits          3 months ago Essential hypertension   [facility] Bo Merino, FNP   9 months ago Essential hypertension   [facility] Bo Merino, FNP   1 year ago Essential hypertension   [facility] Delsa Grana, PA-C   1 year ago Encounter for completion of form with patient   [facility] Myles Gip, DO   1 year ago Essential hypertension   [facility] Delsa Grana, PA-C      Future Appointments            In 2 months Reece Packer, Myna Hidalgo, [facility]

## 2021-12-30 DIAGNOSIS — Z4431 Encounter for fitting and adjustment of external right breast prosthesis: Secondary | ICD-10-CM | POA: Diagnosis not present

## 2021-12-30 DIAGNOSIS — Z9011 Acquired absence of right breast and nipple: Secondary | ICD-10-CM | POA: Diagnosis not present

## 2021-12-30 DIAGNOSIS — C50811 Malignant neoplasm of overlapping sites of right female breast: Secondary | ICD-10-CM | POA: Diagnosis not present

## 2021-12-30 DIAGNOSIS — I972 Postmastectomy lymphedema syndrome: Secondary | ICD-10-CM | POA: Diagnosis not present

## 2022-01-09 DIAGNOSIS — Z853 Personal history of malignant neoplasm of breast: Secondary | ICD-10-CM | POA: Diagnosis not present

## 2022-01-09 DIAGNOSIS — Z170 Estrogen receptor positive status [ER+]: Secondary | ICD-10-CM | POA: Diagnosis not present

## 2022-01-09 DIAGNOSIS — C50811 Malignant neoplasm of overlapping sites of right female breast: Secondary | ICD-10-CM | POA: Diagnosis not present

## 2022-01-09 DIAGNOSIS — Z1231 Encounter for screening mammogram for malignant neoplasm of breast: Secondary | ICD-10-CM | POA: Diagnosis not present

## 2022-01-09 DIAGNOSIS — Z9011 Acquired absence of right breast and nipple: Secondary | ICD-10-CM | POA: Diagnosis not present

## 2022-01-27 ENCOUNTER — Other Ambulatory Visit: Payer: Self-pay | Admitting: Nurse Practitioner

## 2022-01-27 ENCOUNTER — Other Ambulatory Visit: Payer: Self-pay | Admitting: Emergency Medicine

## 2022-01-27 ENCOUNTER — Telehealth: Payer: Self-pay | Admitting: Nurse Practitioner

## 2022-01-27 MED ORDER — SEMAGLUTIDE-WEIGHT MANAGEMENT 1 MG/0.5ML ~~LOC~~ SOAJ: 1.0000 mg | SUBCUTANEOUS | 0 refills | Status: DC

## 2022-01-27 NOTE — Telephone Encounter (Signed)
Spoke to patient . Will fax script to pharmacy

## 2022-01-27 NOTE — Telephone Encounter (Signed)
Copied from [place] [id number]. Topic: General - Other >> Jan 27, 2022  2:09 PM [place] B wrote: Reason for [id number] called in returning call to speak with helen about finding weight med. Please call back

## 2022-02-12 ENCOUNTER — Other Ambulatory Visit: Payer: Self-pay

## 2022-02-16 ENCOUNTER — Encounter: Payer: Self-pay | Admitting: Nurse Practitioner

## 2022-02-17 ENCOUNTER — Other Ambulatory Visit: Payer: Self-pay | Admitting: Nurse Practitioner

## 2022-02-17 MED ORDER — SEMAGLUTIDE-WEIGHT MANAGEMENT 1.7 MG/0.75ML ~~LOC~~ SOAJ: 1.7000 mg | SUBCUTANEOUS | 0 refills | Status: DC

## 2022-02-18 NOTE — Telephone Encounter (Signed)
Requested medication (s) are due for refill today: routing for review  Requested medication (s) are on the active medication list: yes  Last refill:  02/17/22  Future visit scheduled:yes  Notes to clinic:  Unable to refill per protocol, last refill by provider 02/17/22. Pharmacy request:PATIENT IS EXPECTING TO INCREASE TO THIS DOSE, RX DATED FOR 05/15/22--PLEASE VERIFY & SEND UPDATED RX. Routing for review.     Requested Prescriptions  Pending Prescriptions Disp Refills   WEGOVY 1.7 MG/0.75ML SOAJ [Pharmacy Med Name: WEGOVY 1.7 MG/0.75ML SUBQ SOLN ML] 3 mL 0    Sig: INJECT 1.7 MG INTO THE SKIN ONCE WEEKLY     Endocrinology:  Diabetes - GLP-1 Receptor Agonists - semaglutide Passed - 02/17/2022  5:41 PM      Passed - HBA1C in normal range and within 180 days    Hgb A1c MFr Bld  Date Value Ref Range Status  09/18/2021 5.4 <5.7 % of total Hgb Final    Comment:    For the purpose of screening for the presence of diabetes: . <5.7%       Consistent with the absence of diabetes 5.7-6.4%    Consistent with increased risk for diabetes             (prediabetes) > or =6.5%  Consistent with diabetes . This assay result is consistent with a decreased risk of diabetes. . Currently, no consensus exists regarding use of hemoglobin A1c for diagnosis of diabetes in children. . According to American Diabetes Association (ADA) guidelines, hemoglobin A1c <7.0% represents optimal control in non-pregnant diabetic patients. Different metrics may apply to specific patient populations.  Standards of Medical Care in Diabetes(ADA). .          Passed - Cr in normal range and within 360 days    Creat  Date Value Ref Range Status  09/18/2021 0.79 0.50 - 0.97 mg/dL Final   Creatinine, Urine  Date Value Ref Range Status  11/13/2015 <10 mg/dL Final         Passed - Valid encounter within last 6 months    Recent Outpatient Visits           5 months ago Essential hypertension   [facility] Bo Merino, FNP   11 months ago Essential hypertension   [facility] Bo Merino, FNP   1 year ago Essential hypertension   [facility] Delsa Grana, PA-C   1 year ago Encounter for completion of form with patient   [facility] Myles Gip, DO   1 year ago Essential hypertension   [facility] Delsa Grana, PA-C       Future Appointments             In 1 month Reece Packer, Myna Hidalgo, [facility], [facility]

## 2022-02-18 NOTE — Telephone Encounter (Signed)
Pharmacy called to report that the patient is completely out of her current supply, pharmacy says 1 MG dose is on long term back order.   Sherrie from [facility]

## 2022-02-24 ENCOUNTER — Telehealth: Payer: Self-pay

## 2022-02-24 NOTE — Telephone Encounter (Signed)
Copied from [place] [phone] [id number]. Topic: General - Inquiry >> Feb 17, 2022  5:46 PM [name] wrote: [phone] FU  PT has called [name] to make sure her Mckenzie Lopez script is corrected at pharmacy, states they told her it was sent back to provider.   Mckenzie Lopez was updated 02/18/22

## 2022-03-02 ENCOUNTER — Encounter: Payer: Self-pay | Admitting: Nurse Practitioner

## 2022-03-02 ENCOUNTER — Other Ambulatory Visit: Payer: Self-pay | Admitting: Nurse Practitioner

## 2022-03-03 ENCOUNTER — Other Ambulatory Visit: Payer: Self-pay

## 2022-03-03 ENCOUNTER — Other Ambulatory Visit: Payer: Self-pay | Admitting: Nurse Practitioner

## 2022-03-03 DIAGNOSIS — E88819 Insulin resistance, unspecified: Secondary | ICD-10-CM

## 2022-03-03 DIAGNOSIS — E8881 Metabolic syndrome: Secondary | ICD-10-CM

## 2022-03-03 MED ORDER — METFORMIN HCL 500 MG PO TABS
500.0000 mg | ORAL_TABLET | Freq: Two times a day (BID) | ORAL | 1 refills | Status: DC
  Filled 2022-03-03: qty 180, 90d supply, fill #0

## 2022-03-17 ENCOUNTER — Other Ambulatory Visit: Payer: Self-pay | Admitting: Nurse Practitioner

## 2022-03-17 ENCOUNTER — Encounter: Payer: Self-pay | Admitting: Nurse Practitioner

## 2022-03-17 ENCOUNTER — Other Ambulatory Visit: Payer: Self-pay

## 2022-03-17 MED ORDER — SEMAGLUTIDE-WEIGHT MANAGEMENT 2.4 MG/0.75ML ~~LOC~~ SOAJ
2.4000 mg | SUBCUTANEOUS | 3 refills | Status: DC
  Filled 2022-03-17: qty 3, 28d supply, fill #0
  Filled 2022-04-21: qty 3, 28d supply, fill #1
  Filled 2022-05-15 – 2022-06-10 (×8): qty 3, 28d supply, fill #2
  Filled 2022-07-05: qty 3, 28d supply, fill #3

## 2022-03-23 ENCOUNTER — Other Ambulatory Visit: Payer: Self-pay

## 2022-03-23 ENCOUNTER — Ambulatory Visit (INDEPENDENT_AMBULATORY_CARE_PROVIDER_SITE_OTHER): Payer: 59 | Admitting: Nurse Practitioner

## 2022-03-23 ENCOUNTER — Encounter: Payer: Self-pay | Admitting: Nurse Practitioner

## 2022-03-23 VITALS — BP 128/84 | HR 98 | Temp 98.6°F | Resp 18 | Ht 65.0 in | Wt 307.3 lb

## 2022-03-23 DIAGNOSIS — E782 Mixed hyperlipidemia: Secondary | ICD-10-CM

## 2022-03-23 DIAGNOSIS — C50411 Malignant neoplasm of upper-outer quadrant of right female breast: Secondary | ICD-10-CM

## 2022-03-23 DIAGNOSIS — Z6841 Body Mass Index (BMI) 40.0 and over, adult: Secondary | ICD-10-CM

## 2022-03-23 DIAGNOSIS — R7303 Prediabetes: Secondary | ICD-10-CM | POA: Diagnosis not present

## 2022-03-23 DIAGNOSIS — I10 Essential (primary) hypertension: Secondary | ICD-10-CM

## 2022-03-23 DIAGNOSIS — Z170 Estrogen receptor positive status [ER+]: Secondary | ICD-10-CM | POA: Diagnosis not present

## 2022-03-23 DIAGNOSIS — E8881 Metabolic syndrome: Secondary | ICD-10-CM

## 2022-03-23 MED ORDER — VALSARTAN-HYDROCHLOROTHIAZIDE 160-12.5 MG PO TABS
1.0000 | ORAL_TABLET | Freq: Every day | ORAL | 3 refills | Status: DC
  Filled 2022-03-23: qty 90, 90d supply, fill #0
  Filled 2022-06-19: qty 90, 90d supply, fill #1
  Filled 2022-09-16: qty 90, 90d supply, fill #2
  Filled 2022-12-20: qty 90, 90d supply, fill #3

## 2022-03-23 MED ORDER — AMLODIPINE BESYLATE 5 MG PO TABS
5.0000 mg | ORAL_TABLET | Freq: Every day | ORAL | 3 refills | Status: DC
  Filled 2022-03-23: qty 90, 90d supply, fill #0
  Filled 2022-06-19: qty 90, 90d supply, fill #1
  Filled 2022-09-16: qty 90, 90d supply, fill #2
  Filled 2022-12-20: qty 90, 90d supply, fill #3

## 2022-03-23 MED ORDER — METOPROLOL SUCCINATE ER 50 MG PO TB24
50.0000 mg | ORAL_TABLET | Freq: Every day | ORAL | 3 refills | Status: DC
  Filled 2022-03-23: qty 90, 90d supply, fill #0
  Filled 2022-06-19: qty 90, 90d supply, fill #1
  Filled 2022-09-16: qty 90, 90d supply, fill #2
  Filled 2022-12-20: qty 90, 90d supply, fill #3

## 2022-03-23 NOTE — Assessment & Plan Note (Signed)
Continue with wegovy.

## 2022-03-23 NOTE — Progress Notes (Signed)
BP 128/84   Pulse 98   Temp 98.6 F (37 C) (Oral)   Resp 18   Ht _0  (1.651 m)   Wt (!) 307 lb 4.8 oz (139.4 kg)   LMP 02/08/2018 (Exact Date)   SpO2 96%   BMI 51.14 kg/m    Subjective:    Patient ID: Mckenzie Lopez, female    DOB: July 02, 1985, 36 y.o.   MRN: [id number]  HPI: Mckenzie Lopez is a 36 y.o. female  Chief Complaint  Patient presents with   Diabetes   Hypertension   HTN: Her blood pressure today is 128/84.  She currently takes valsartan-hydrochlorothiazide 160-12.5 mg daily and amlodipine 5 mg daily.  She denies any shortness of breath, headaches, blurred vision or chest pain.   Prediabetes: Her last A1C was 5.4 on 09/18/2021.  She currently takes metformin 500 mg twice daily.  Her last elevated reading was on 03/21/2020 at 5.8.  She denies any polyuria, polyphagia or polydipsia. She says the metformin has been giving her diarrhea.  She says that she would like to stop taking.   Hyperlipidemia: Her last LDL was 124 on 09/18/2021.  She is not currently on cholesterol lowering medication she is working on life style modification.   Obesity/metabolic syndrome/insulin resistance: She has been taking metformin 500 mg BID and we started her on wegovy at last visit.  She is currently on the 2.4 mg weekly dose.  Her starting weight was 301 lbs, today her weight is 307 lbs with a BMI of 51.14.  Breast cancer: Last saw oncology on 01/09/2022.  Note from oncology: pathologic stage IB pT3 (8 cm), pN2a (5/21), cM0, grade 2, ER + PR + HER2 -, IDC and associated DCIS of the RIGHT breast s/p RIGHT modified radical mastectomy on 01/05/18 who presents for post-treatment evaluation. The patient has been doing well since her procedure and has completed adjuvant chemotherapy and radiation and underwent TAH/BSO last year with no evidence of cancer. She switched from letrozole to anastrozole with decreased joint pain. She is currently taking anastrozole 1 mg daily. Her next  appointment with oncology is 03/30/2022.   Relevant past medical, surgical, family and social history reviewed and updated as indicated. Interim medical history since our last visit reviewed. Allergies and medications reviewed and updated.  Review of Systems  Constitutional: Negative for fever or weight change.  Respiratory: Negative for cough and shortness of breath.   Cardiovascular: Negative for chest pain or palpitations.  Gastrointestinal: Negative for abdominal pain, no bowel changes.  Musculoskeletal: Negative for gait problem or joint swelling.  Skin: Negative for rash.  Neurological: Negative for dizziness or headache.  No other specific complaints in a complete review of systems (except as listed in HPI above).      Objective:    BP 128/84   Pulse 98   Temp 98.6 F (37 C) (Oral)   Resp 18   Ht _1  (1.651 m)   Wt (!) 307 lb 4.8 oz (139.4 kg)   LMP 02/08/2018 (Exact Date)   SpO2 96%   BMI 51.14 kg/m   Wt Readings from Last 3 Encounters:  03/23/22 (!) 307 lb 4.8 oz (139.4 kg)  12/04/21 (!) 309 lb (140.2 kg)  09/18/21 (!) 301 lb 11.2 oz (136.9 kg)    Physical Exam  Constitutional: Patient appears well-developed and well-nourished. Obese  No distress.  HEENT: head atraumatic, normocephalic, pupils equal and reactive to light,  neck supple Cardiovascular: Normal rate, regular rhythm and  normal heart sounds.  No murmur heard. No BLE edema. Pulmonary/Chest: Effort normal and breath sounds normal. No respiratory distress. Abdominal: Soft.  There is no tenderness. Psychiatric: Patient has a normal mood and affect. behavior is normal. Judgment and thought content normal.  Results for orders placed or performed in visit on 12/04/21  Cytology - PAP  Result Value Ref Range   High risk HPV Negative    Adequacy Satisfactory for evaluation.    Diagnosis      - Negative for intraepithelial lesion or malignancy (NILM)   Comment Normal Reference Range HPV - Negative        Assessment & Plan:   Problem List Items Addressed This Visit       Cardiovascular and Mediastinum   Essential hypertension - Primary    Continue taking valsartan-hydrochlorothiazide 160-12.5 mg daily, and amlodipine 5 mg daily.       Relevant Medications   valsartan-hydrochlorothiazide (DIOVAN HCT) 160-12.5 MG tablet   metoprolol succinate (TOPROL XL) 50 MG 24 hr tablet   amLODipine (NORVASC) 5 MG tablet   Other Relevant Orders   CBC with Differential/Platelet   COMPLETE METABOLIC PANEL WITH GFR     Other   Morbid obesity with BMI of 50.0-59.9, adult ([place])    Continue with wegovy.       Hyperlipidemia    Continue with lifestyle modification      Relevant Medications   valsartan-hydrochlorothiazide (DIOVAN HCT) 160-12.5 MG tablet   metoprolol succinate (TOPROL XL) 50 MG 24 hr tablet   amLODipine (NORVASC) 5 MG tablet   Other Relevant Orders   Lipid panel   Malignant neoplasm of upper-outer quadrant of right breast in female, estrogen receptor positive ([place])    Continue follow up appointments and treatment plan with oncology.       Prediabetes    Last A1C was 5.4.  patient would like to stop metformin due to diarrhea.  Discussed with patient that she can stop.  Continue wegovy.       Relevant Orders   Hemoglobin Z5G   Metabolic syndrome    Continue with wegovy       Relevant Orders   Hemoglobin A1c     Follow up plan: Return in about 6 months (around 09/21/2022) for follow up.

## 2022-03-23 NOTE — Assessment & Plan Note (Signed)
Continue with wegovy

## 2022-03-23 NOTE — Assessment & Plan Note (Signed)
Last A1C was 5.4.  patient would like to stop metformin due to diarrhea.  Discussed with patient that she can stop.  Continue wegovy.

## 2022-03-23 NOTE — Assessment & Plan Note (Signed)
Continue follow up appointments and treatment plan with oncology.

## 2022-03-23 NOTE — Assessment & Plan Note (Signed)
Continue with lifestyle modification.  

## 2022-03-23 NOTE — Assessment & Plan Note (Signed)
Continue taking valsartan-hydrochlorothiazide 160-12.5 mg daily, and amlodipine 5 mg daily.

## 2022-03-24 LAB — COMPLETE METABOLIC PANEL WITH GFR
AG Ratio: 1.3 (calc) (ref 1.0–2.5)
ALT: 12 U/L (ref 6–29)
AST: 11 U/L (ref 10–30)
Albumin: 4.3 g/dL (ref 3.6–5.1)
Alkaline phosphatase (APISO): 54 U/L (ref 31–125)
BUN: 13 mg/dL (ref 7–25)
CO2: 27 mmol/L (ref 20–32)
Calcium: 9.8 mg/dL (ref 8.6–10.2)
Chloride: 104 mmol/L (ref 98–110)
Creat: 0.95 mg/dL (ref 0.50–0.97)
Globulin: 3.2 g/dL (calc) (ref 1.9–3.7)
Glucose, Bld: 79 mg/dL (ref 65–99)
Potassium: 3.6 mmol/L (ref 3.5–5.3)
Sodium: 142 mmol/L (ref 135–146)
Total Bilirubin: 0.3 mg/dL (ref 0.2–1.2)
Total Protein: 7.5 g/dL (ref 6.1–8.1)
eGFR: 79 mL/min/{1.73_m2} (ref >=60)

## 2022-03-24 LAB — LIPID PANEL
Cholesterol: 183 mg/dL (ref <200)
HDL: 47 mg/dL — ABNORMAL LOW (ref >=50)
LDL Cholesterol (Calc): 112 mg/dL (calc) — ABNORMAL HIGH
Non-HDL Cholesterol (Calc): 136 mg/dL (calc) — ABNORMAL HIGH (ref <130)
Total CHOL/HDL Ratio: 3.9 (calc) (ref <5.0)
Triglycerides: 128 mg/dL (ref <150)

## 2022-03-24 LAB — CBC WITH DIFFERENTIAL/PLATELET
Absolute Monocytes: 383 cells/uL (ref 200–950)
Basophils Absolute: 61 cells/uL (ref 0–200)
Basophils Relative: 1.2 %
Eosinophils Absolute: 122 cells/uL (ref 15–500)
Eosinophils Relative: 2.4 %
HCT: 34 % — ABNORMAL LOW (ref 35.0–45.0)
Hemoglobin: 11.8 g/dL (ref 11.7–15.5)
Lymphs Abs: 1321 cells/uL (ref 850–3900)
MCH: 28.6 pg (ref 27.0–33.0)
MCHC: 34.7 g/dL (ref 32.0–36.0)
MCV: 82.5 fL (ref 80.0–100.0)
MPV: 8.7 fL (ref 7.5–12.5)
Monocytes Relative: 7.5 %
Neutro Abs: 3213 cells/uL (ref 1500–7800)
Neutrophils Relative %: 63 %
Platelets: 494 10*3/uL — ABNORMAL HIGH (ref 140–400)
RBC: 4.12 10*6/uL (ref 3.80–5.10)
RDW: 14.5 % (ref 11.0–15.0)
Total Lymphocyte: 25.9 %
WBC: 5.1 10*3/uL (ref 3.8–10.8)

## 2022-03-24 LAB — HEMOGLOBIN A1C
Hgb A1c MFr Bld: 5.9 % of total Hgb — ABNORMAL HIGH (ref <5.7)
Mean Plasma Glucose: 123 mg/dL
eAG (mmol/L): 6.8 mmol/L

## 2022-03-26 ENCOUNTER — Other Ambulatory Visit: Payer: Self-pay

## 2022-03-30 ENCOUNTER — Other Ambulatory Visit: Payer: Self-pay

## 2022-03-30 DIAGNOSIS — Z170 Estrogen receptor positive status [ER+]: Secondary | ICD-10-CM | POA: Diagnosis not present

## 2022-03-30 DIAGNOSIS — Z90710 Acquired absence of both cervix and uterus: Secondary | ICD-10-CM | POA: Diagnosis not present

## 2022-03-30 DIAGNOSIS — G620 Drug-induced polyneuropathy: Secondary | ICD-10-CM | POA: Diagnosis not present

## 2022-03-30 DIAGNOSIS — Z9011 Acquired absence of right breast and nipple: Secondary | ICD-10-CM | POA: Diagnosis not present

## 2022-03-30 DIAGNOSIS — T451X5A Adverse effect of antineoplastic and immunosuppressive drugs, initial encounter: Secondary | ICD-10-CM | POA: Diagnosis not present

## 2022-03-30 DIAGNOSIS — Z79811 Long term (current) use of aromatase inhibitors: Secondary | ICD-10-CM | POA: Diagnosis not present

## 2022-03-30 DIAGNOSIS — C50811 Malignant neoplasm of overlapping sites of right female breast: Secondary | ICD-10-CM | POA: Diagnosis not present

## 2022-03-30 DIAGNOSIS — I890 Lymphedema, not elsewhere classified: Secondary | ICD-10-CM | POA: Diagnosis not present

## 2022-03-30 DIAGNOSIS — Z9189 Other specified personal risk factors, not elsewhere classified: Secondary | ICD-10-CM | POA: Diagnosis not present

## 2022-03-30 DIAGNOSIS — Z90721 Acquired absence of ovaries, unilateral: Secondary | ICD-10-CM | POA: Diagnosis not present

## 2022-03-30 MED ORDER — ANASTROZOLE 1 MG PO TABS
ORAL_TABLET | ORAL | 4 refills | Status: DC
  Filled 2022-03-30: qty 90, 90d supply, fill #0

## 2022-03-30 MED ORDER — DULOXETINE HCL 30 MG PO CPEP
ORAL_CAPSULE | ORAL | 3 refills | Status: DC
  Filled 2022-03-30: qty 270, 90d supply, fill #0
  Filled 2022-07-05: qty 270, 90d supply, fill #1
  Filled 2022-09-30: qty 270, 90d supply, fill #2
  Filled 2022-12-30: qty 270, 90d supply, fill #3

## 2022-03-31 ENCOUNTER — Other Ambulatory Visit: Payer: Self-pay

## 2022-04-06 ENCOUNTER — Other Ambulatory Visit: Payer: Self-pay

## 2022-04-21 ENCOUNTER — Other Ambulatory Visit: Payer: Self-pay

## 2022-05-15 ENCOUNTER — Other Ambulatory Visit: Payer: Self-pay

## 2022-05-18 ENCOUNTER — Encounter: Payer: Self-pay | Admitting: Nurse Practitioner

## 2022-05-21 ENCOUNTER — Other Ambulatory Visit: Payer: Self-pay

## 2022-05-21 ENCOUNTER — Telehealth: Payer: Self-pay | Admitting: Nurse Practitioner

## 2022-05-21 NOTE — Telephone Encounter (Signed)
Pharmacy is saying RX for Mckenzie Lopez still showing it needs PA but pts insurance [employer] advised pt that it should be going through and shouldn't be any issues / please advise asap

## 2022-05-21 NOTE — Telephone Encounter (Signed)
This has been sent to [place]

## 2022-05-22 ENCOUNTER — Other Ambulatory Visit: Payer: Self-pay | Admitting: Nurse Practitioner

## 2022-05-22 DIAGNOSIS — I10 Essential (primary) hypertension: Secondary | ICD-10-CM

## 2022-05-22 NOTE — Telephone Encounter (Signed)
Unable to refill per protocol, Rx request is too soon. Last refill 05/23/21 for 90 and 3 refills. Will refuse.  Requested Prescriptions  Pending Prescriptions Disp Refills   valsartan-hydrochlorothiazide (DIOVAN-HCT) 160-12.5 MG tablet [Pharmacy Med Name: VALSARTAN/HCTZ '160MG'$ /12.'5MG'$  TABLETS] 90 tablet 3    Sig: TAKE 1 TABLET BY MOUTH DAILY     Cardiovascular: ARB + Diuretic Combos Passed - 05/22/2022  6:23 AM      Passed - K in normal range and within 180 days    Potassium  Date Value Ref Range Status  03/23/2022 3.6 3.5 - 5.3 mmol/L Final         Passed - Na in normal range and within 180 days    Sodium  Date Value Ref Range Status  03/23/2022 142 135 - 146 mmol/L Final  02/25/2015 140 136 - 144 mmol/L Final    Comment:                  **Please note reference interval change**         Passed - Cr in normal range and within 180 days    Creat  Date Value Ref Range Status  03/23/2022 0.95 0.50 - 0.97 mg/dL Final   Creatinine, Urine  Date Value Ref Range Status  11/13/2015 <10 mg/dL Final         Passed - eGFR is 10 or above and within 180 days    GFR, Est African American  Date Value Ref Range Status  09/18/2020 85 > OR = 60 mL/min/1.50m Final   GFR, Est Non African American  Date Value Ref Range Status  09/18/2020 73 > OR = 60 mL/min/1.742mFinal   eGFR  Date Value Ref Range Status  03/23/2022 79 > OR = 60 mL/min/1.7320minal         Passed - Patient is not pregnant      Passed - Last BP in normal range    BP Readings from Last 1 Encounters:  03/23/22 128/84         Passed - Valid encounter within last 6 months    Recent Outpatient Visits           2 months ago [facility]ential [facility]   8 months ago [facility]ential hypertension   [facility]   1 year ago Essential hypertension   [facility]   1 year ago Essential hypertension    [facility]   1 year ago Encounter for completion of form with patient   [facility]       Future Appointments             In 4 months [facility]             metoprolol succinate (TOPROL-XL) 50 MG 24 hr tablet [Pharmacy Med Name: METOPROLOL ER SUCCINATE '50MG'$  TABS] 90 tablet 3    Sig: TAKE 1 TABLET(50 MG) BY MOUTH DAILY WITH OR IMMEDIATELY FOLLOWING A MEAL     Cardiovascular:  Beta Blockers Passed - 05/22/2022  6:23 AM      Passed - Last BP in normal range    BP Readings from Last 1 Encounters:  03/23/22 128/84         Passed - Last Heart Rate in normal range    Pulse  Readings from Last 1 Encounters:  03/23/22 98         Passed - Valid encounter within last 6 months    Recent Outpatient Visits           2 months ago Essential hypertension   [facility] Bo Merino, FNP   8 months ago Essential hypertension   [facility] Bo Merino, FNP   1 year ago Essential hypertension   [facility] Bo Merino, FNP   1 year ago Essential hypertension   [facility] Delsa Grana, PA-C   1 year ago Encounter for completion of form with patient   [facility] Myles Gip, DO       Future Appointments             In 4 months Reece Packer, Myna Hidalgo, [facility], [facility]

## 2022-05-26 ENCOUNTER — Other Ambulatory Visit: Payer: Self-pay

## 2022-05-28 ENCOUNTER — Other Ambulatory Visit: Payer: Self-pay

## 2022-05-28 NOTE — Telephone Encounter (Signed)
PT is calling to check on the status of her PA. Pt states if the medication is not approved. This will be the second week that she will miss taking the medication

## 2022-05-28 NOTE — Telephone Encounter (Signed)
Patient notified to come in for appointment early to get documentation of weight loss for PA

## 2022-06-04 ENCOUNTER — Encounter: Payer: Self-pay | Admitting: Nurse Practitioner

## 2022-06-04 ENCOUNTER — Ambulatory Visit (INDEPENDENT_AMBULATORY_CARE_PROVIDER_SITE_OTHER): Payer: Commercial Managed Care - PPO | Admitting: Nurse Practitioner

## 2022-06-04 ENCOUNTER — Other Ambulatory Visit: Payer: Self-pay

## 2022-06-04 VITALS — BP 116/72 | HR 90 | Resp 16 | Ht 65.0 in | Wt 299.0 lb

## 2022-06-04 DIAGNOSIS — E88819 Insulin resistance, unspecified: Secondary | ICD-10-CM | POA: Diagnosis not present

## 2022-06-04 DIAGNOSIS — Z6841 Body Mass Index (BMI) 40.0 and over, adult: Secondary | ICD-10-CM | POA: Diagnosis not present

## 2022-06-04 DIAGNOSIS — Z170 Estrogen receptor positive status [ER+]: Secondary | ICD-10-CM

## 2022-06-04 DIAGNOSIS — E8881 Metabolic syndrome: Secondary | ICD-10-CM | POA: Diagnosis not present

## 2022-06-04 DIAGNOSIS — E782 Mixed hyperlipidemia: Secondary | ICD-10-CM | POA: Diagnosis not present

## 2022-06-04 DIAGNOSIS — C50411 Malignant neoplasm of upper-outer quadrant of right female breast: Secondary | ICD-10-CM | POA: Diagnosis not present

## 2022-06-04 DIAGNOSIS — R7303 Prediabetes: Secondary | ICD-10-CM

## 2022-06-04 DIAGNOSIS — I10 Essential (primary) hypertension: Secondary | ICD-10-CM

## 2022-06-04 NOTE — Assessment & Plan Note (Signed)
Continue working on lifestyle modification. Continue taking wegovy 2.4 mg weekly

## 2022-06-04 NOTE — Progress Notes (Signed)
BP 116/72   Pulse 90   Resp 16   Ht '5\' 5"'$  (1.651 m)   Wt 299 lb (135.6 kg)   LMP 02/08/2018 (Exact Date)   SpO2 96%   BMI 49.76 kg/m    Subjective:    Patient ID: Mckenzie Lopez, female    DOB: [birth year], 37 y.o.   MRN: [id number]  HPI: Mckenzie Lopez is a 37 y.o. female  Chief Complaint  Patient presents with   Medication Management   HTN: Blood pressure today is 116/72.  He currently takes amlodipine 5 mg daily and valsartan-hydrochlorothiazide 160-12.5 mg daily.  She denies any chest pain, headaches, blurred vision or shortness of breath.  Prediabetes: A1c was 5.9 on 03/20/2022.  Denies any polydipsia, polyuria or polyphagia. She was on metformin but it was causing diarrhea.   Hyperlipidemia: LDL was 112 on 03/20/2022.  She is not currently on cholesterol-lowering medication she is working on lifestyle modification.  Obesity/metabolic syndrome/insulin resistance: Currently taking Wegovy 2.4 mg weekly.  Last visit her weight was 307 pounds with a BMI 51.14.  Her weight today is 299 lbs with a BMI of 49.76. she has lost 8 lbs in two months.  Continues to work on lifestyle modification including well-balanced diet daily. She says the wegovy is really helping her.  Will continue current treatment.   Breast cancer: Last saw oncology on 03/30/2022. Note from oncology: pathologic stage IB pT3 (8 cm), pN2a (5/21), cM0, grade 2, ER + PR + HER2 -, IDC and associated DCIS of the RIGHT breast s/p RIGHT modified radical mastectomy on 01/05/18 who presents for post-treatment evaluation. The patient has been doing well since her procedure and has completed adjuvant chemotherapy and radiation and underwent TAH/BSO last year with no evidence of cancer.She is currently on arimidex. No changes.   Relevant past medical, surgical, family and social history reviewed and updated as indicated. Interim medical history since our last visit reviewed. Allergies and medications reviewed and  updated.  Review of Systems  Constitutional: Negative for fever or weight change.  Respiratory: Negative for cough and shortness of breath.   Cardiovascular: Negative for chest pain or palpitations.  Gastrointestinal: Negative for abdominal pain, no bowel changes.  Musculoskeletal: Negative for gait problem or joint swelling.  Skin: Negative for rash.  Neurological: Negative for dizziness or headache.  No other specific complaints in a complete review of systems (except as listed in HPI above).      Objective:    BP 116/72   Pulse 90   Resp 16   Ht '5\' 5"'$  (1.651 m)   Wt 299 lb (135.6 kg)   LMP 02/08/2018 (Exact Date)   SpO2 96%   BMI 49.76 kg/m   Wt Readings from Last 3 Encounters:  06/04/22 299 lb (135.6 kg)  03/23/22 (!) 307 lb 4.8 oz (139.4 kg)  12/04/21 (!) 309 lb (140.2 kg)    Physical Exam  Constitutional: Patient appears well-developed and well-nourished. Obese  No distress.  HEENT: head atraumatic, normocephalic, pupils equal and reactive to light,  neck supple Cardiovascular: Normal rate, regular rhythm and normal heart sounds.  No murmur heard. No BLE edema. Pulmonary/Chest: Effort normal and breath sounds normal. No respiratory distress. Abdominal: Soft.  There is no tenderness. Psychiatric: Patient has a normal mood and affect. behavior is normal. Judgment and thought content normal.   Results for orders placed or performed in visit on 03/23/22  Lipid panel  Result Value Ref Range   Cholesterol 183 <200  mg/dL   HDL 47 (L) > OR = 50 mg/dL   Triglycerides 128 <150 mg/dL   LDL Cholesterol (Calc) 112 (H) mg/dL (calc)   Total CHOL/HDL Ratio 3.9 <5.0 (calc)   Non-HDL Cholesterol (Calc) 136 (H) <130 mg/dL (calc)  CBC with Differential/Platelet  Result Value Ref Range   WBC 5.1 3.8 - 10.8 Thousand/uL   RBC 4.12 3.80 - 5.10 Million/uL   Hemoglobin 11.8 11.7 - 15.5 g/dL   HCT 34.0 (L) 35.0 - 45.0 %   MCV 82.5 80.0 - 100.0 fL   MCH 28.6 27.0 - 33.0 pg   MCHC  34.7 32.0 - 36.0 g/dL   RDW 14.5 11.0 - 15.0 %   Platelets 494 (H) 140 - 400 Thousand/uL   MPV 8.7 7.5 - 12.5 fL   Neutro Abs 3,213 1,500 - 7,800 cells/uL   Lymphs Abs 1,321 850 - 3,900 cells/uL   Absolute Monocytes 383 200 - 950 cells/uL   Eosinophils Absolute 122 15 - 500 cells/uL   Basophils Absolute 61 0 - 200 cells/uL   Neutrophils Relative % 63 %   Total Lymphocyte 25.9 %   Monocytes Relative 7.5 %   Eosinophils Relative 2.4 %   Basophils Relative 1.2 %  COMPLETE METABOLIC PANEL WITH GFR  Result Value Ref Range   Glucose, Bld 79 65 - 99 mg/dL   BUN 13 7 - 25 mg/dL   Creat 0.95 0.50 - 0.97 mg/dL   eGFR 79 > OR = 60 mL/min/1.51m   BUN/Creatinine Ratio SEE NOTE: 6 - 22 (calc)   Sodium 142 135 - 146 mmol/L   Potassium 3.6 3.5 - 5.3 mmol/L   Chloride 104 98 - 110 mmol/L   CO2 27 20 - 32 mmol/L   Calcium 9.8 8.6 - 10.2 mg/dL   Total Protein 7.5 6.1 - 8.1 g/dL   Albumin 4.3 3.6 - 5.1 g/dL   Globulin 3.2 1.9 - 3.7 g/dL (calc)   AG Ratio 1.3 1.0 - 2.5 (calc)   Total Bilirubin 0.3 0.2 - 1.2 mg/dL   Alkaline phosphatase (APISO) 54 31 - 125 U/L   AST 11 10 - 30 U/L   ALT 12 6 - 29 U/L  Hemoglobin A1c  Result Value Ref Range   Hgb A1c MFr Bld 5.9 (H) <5.7 % of total Hgb   Mean Plasma Glucose 123 mg/dL   eAG (mmol/L) 6.8 mmol/L      Assessment & Plan:   Problem List Items Addressed This Visit       Cardiovascular and Mediastinum   Essential hypertension - Primary    Continue taking amlodipine 5 mg daily and valsartan-hydrochlorothiazide 160-12.5 mg daily.         Endocrine   Insulin resistance    Continue working on lifestyle modification. Continue taking wegovy 2.4 mg weekly        Other   Morbid obesity with BMI of 50.0-59.9, adult [place]    Continue working on lifestyle modification. Continue taking wegovy 2.4 mg weekly      Hyperlipidemia    Continue working on lifestyle modification.       Malignant neoplasm of upper-outer quadrant of right breast in  female, estrogen receptor positive ([facility]    Continue treatment plan with oncology      Prediabetes    Continue working on lifestyle modification      Metabolic syndrome    Continue working on lifestyle modification. Continue taking wegovy 2.4 mg weekly  Follow up plan: Return in about 6 months (around 12/03/2022) for follow up.

## 2022-06-04 NOTE — Assessment & Plan Note (Signed)
Continue taking amlodipine 5 mg daily and valsartan-hydrochlorothiazide 160-12.5 mg daily.

## 2022-06-04 NOTE — Assessment & Plan Note (Signed)
Continue working on lifestyle modification 

## 2022-06-04 NOTE — Assessment & Plan Note (Signed)
Continue treatment plan with oncology

## 2022-06-05 ENCOUNTER — Other Ambulatory Visit: Payer: Self-pay

## 2022-06-08 ENCOUNTER — Other Ambulatory Visit: Payer: Self-pay

## 2022-06-10 ENCOUNTER — Other Ambulatory Visit: Payer: Self-pay

## 2022-06-30 DIAGNOSIS — C50811 Malignant neoplasm of overlapping sites of right female breast: Secondary | ICD-10-CM | POA: Diagnosis not present

## 2022-06-30 DIAGNOSIS — Z170 Estrogen receptor positive status [ER+]: Secondary | ICD-10-CM | POA: Diagnosis not present

## 2022-07-06 ENCOUNTER — Other Ambulatory Visit: Payer: Self-pay

## 2022-07-27 ENCOUNTER — Other Ambulatory Visit: Payer: Self-pay | Admitting: Nurse Practitioner

## 2022-07-27 ENCOUNTER — Other Ambulatory Visit: Payer: Self-pay

## 2022-07-27 ENCOUNTER — Encounter: Payer: Self-pay | Admitting: Nurse Practitioner

## 2022-07-27 ENCOUNTER — Other Ambulatory Visit: Payer: Self-pay | Admitting: Internal Medicine

## 2022-07-27 MED ORDER — SEMAGLUTIDE-WEIGHT MANAGEMENT 2.4 MG/0.75ML ~~LOC~~ SOAJ
2.4000 mg | SUBCUTANEOUS | 2 refills | Status: DC
  Filled 2022-07-28: qty 3, 28d supply, fill #0
  Filled 2022-08-26: qty 3, 28d supply, fill #1
  Filled 2022-09-21: qty 3, 28d supply, fill #2

## 2022-07-28 ENCOUNTER — Other Ambulatory Visit: Payer: Self-pay

## 2022-07-29 ENCOUNTER — Other Ambulatory Visit: Payer: Self-pay

## 2022-08-06 ENCOUNTER — Other Ambulatory Visit: Payer: Self-pay

## 2022-08-11 ENCOUNTER — Encounter: Payer: Self-pay | Admitting: Nurse Practitioner

## 2022-08-12 ENCOUNTER — Other Ambulatory Visit: Payer: Self-pay

## 2022-08-12 ENCOUNTER — Other Ambulatory Visit: Payer: Self-pay | Admitting: Nurse Practitioner

## 2022-08-12 MED ORDER — OLOPATADINE HCL 0.2 % OP SOLN
1.0000 [drp] | Freq: Every day | OPHTHALMIC | 11 refills | Status: DC
  Filled 2022-08-12: qty 2.5, fill #0
  Filled 2022-08-12 – 2022-08-13 (×3): qty 2.5, 30d supply, fill #0
  Filled 2022-08-14: qty 2.5, 50d supply, fill #0
  Filled 2022-12-30: qty 2.5, 25d supply, fill #0
  Filled 2023-05-31: qty 2.5, 50d supply, fill #0
  Filled 2023-07-19: qty 2.5, 34d supply, fill #1
  Filled 2023-08-03: qty 2.5, 34d supply, fill #2
  Filled 2023-08-16: qty 2.5, 30d supply, fill #2

## 2022-08-12 MED ORDER — OLOPATADINE HCL 0.2 % OP SOLN: OPHTHALMIC | 11 refills | Status: DC

## 2022-08-14 ENCOUNTER — Other Ambulatory Visit: Payer: Self-pay

## 2022-09-20 ENCOUNTER — Other Ambulatory Visit: Payer: Self-pay | Admitting: Nurse Practitioner

## 2022-09-21 ENCOUNTER — Ambulatory Visit: Payer: Medicaid Other | Admitting: Nurse Practitioner

## 2022-09-21 ENCOUNTER — Other Ambulatory Visit: Payer: Self-pay

## 2022-09-21 DIAGNOSIS — Z9189 Other specified personal risk factors, not elsewhere classified: Secondary | ICD-10-CM | POA: Diagnosis not present

## 2022-09-21 DIAGNOSIS — M898X9 Other specified disorders of bone, unspecified site: Secondary | ICD-10-CM | POA: Diagnosis not present

## 2022-09-21 DIAGNOSIS — M79673 Pain in unspecified foot: Secondary | ICD-10-CM | POA: Diagnosis not present

## 2022-09-21 DIAGNOSIS — Z79811 Long term (current) use of aromatase inhibitors: Secondary | ICD-10-CM | POA: Diagnosis not present

## 2022-09-21 DIAGNOSIS — T451X5A Adverse effect of antineoplastic and immunosuppressive drugs, initial encounter: Secondary | ICD-10-CM | POA: Diagnosis not present

## 2022-09-21 DIAGNOSIS — I890 Lymphedema, not elsewhere classified: Secondary | ICD-10-CM | POA: Diagnosis not present

## 2022-09-21 DIAGNOSIS — Z1379 Encounter for other screening for genetic and chromosomal anomalies: Secondary | ICD-10-CM | POA: Diagnosis not present

## 2022-09-21 DIAGNOSIS — G629 Polyneuropathy, unspecified: Secondary | ICD-10-CM | POA: Diagnosis not present

## 2022-09-21 DIAGNOSIS — R92322 Mammographic fibroglandular density, left breast: Secondary | ICD-10-CM | POA: Diagnosis not present

## 2022-09-21 DIAGNOSIS — M25569 Pain in unspecified knee: Secondary | ICD-10-CM | POA: Diagnosis not present

## 2022-09-21 DIAGNOSIS — G620 Drug-induced polyneuropathy: Secondary | ICD-10-CM | POA: Diagnosis not present

## 2022-09-21 DIAGNOSIS — Z170 Estrogen receptor positive status [ER+]: Secondary | ICD-10-CM | POA: Diagnosis not present

## 2022-09-21 DIAGNOSIS — M8589 Other specified disorders of bone density and structure, multiple sites: Secondary | ICD-10-CM | POA: Diagnosis not present

## 2022-09-21 DIAGNOSIS — Z9011 Acquired absence of right breast and nipple: Secondary | ICD-10-CM | POA: Diagnosis not present

## 2022-09-21 DIAGNOSIS — C50811 Malignant neoplasm of overlapping sites of right female breast: Secondary | ICD-10-CM | POA: Diagnosis not present

## 2022-09-21 DIAGNOSIS — Z780 Asymptomatic menopausal state: Secondary | ICD-10-CM | POA: Diagnosis not present

## 2022-09-21 LAB — HM DEXA SCAN: HM Dexa Scan: NORMAL

## 2022-09-21 MED ORDER — GABAPENTIN 300 MG PO CAPS
300.0000 mg | ORAL_CAPSULE | Freq: Every day | ORAL | 3 refills | Status: DC
  Filled 2022-09-21: qty 90, 90d supply, fill #0
  Filled 2022-12-20: qty 90, 90d supply, fill #1
  Filled 2023-03-16: qty 90, 90d supply, fill #2
  Filled 2023-06-14: qty 90, 90d supply, fill #3

## 2022-09-22 ENCOUNTER — Other Ambulatory Visit: Payer: Self-pay

## 2022-09-29 NOTE — Progress Notes (Unsigned)
LMP 02/08/2018 (Exact Date)    Subjective:    Patient ID: Paw Earnestine Mealing, female    DOB: 1985/12/22, 37 y.o.   MRN: [id number]  HPI: Amayiah Nizhoni Truong is a 37 y.o. female  No chief complaint on file.  HTN: her blood pressure today is *** . She is taking valsartan-hydrochlorothiazide 160-12.5 mg daily and amlodipine 5 mg daily. She denies any chest pain, blurred vision, headaches or shortness of breath.   Prediabetes: Her last A1C was 5.9 on 03/20/2022.  She denies any polyuria, polyphagia or polydipsia.  She was previously on metformin but did not tolerate it.  Hyperlipidemia: LDL was 112 on 03/20/2022.  She is not currently on cholesterol-lowering medication.  Recommend she continue to work on lifestyle modification.  Will get labs today.  Obesity/metabolic syndrome/insulin resistance: Her weight at the last appointment was 299 pounds with a BMI of 49.76. her weight today is *** with a BMI of ***. Currently on Wegovy 2.4 mg weekly. She reports ***   Breast cancer: Note from oncology: pathologic stage IB pT3 (8 cm), pN2a (5/21), cM0, grade 2, ER + PR + HER2 -, IDC and associated DCIS of the RIGHT breast s/p RIGHT modified radical mastectomy on 01/05/18 who presents for post-treatment evaluation. The patient has been doing well since her procedure and has completed adjuvant chemotherapy and radiation and underwent TAH/BSO last year with no evidence of cancer.She is currently on arimidex. Last saw oncology on 09/21/2022. Patient reports ***  Relevant past medical, surgical, family and social history reviewed and updated as indicated. Interim medical history since our last visit reviewed. Allergies and medications reviewed and updated.  Review of Systems  Constitutional: Negative for fever or weight change.  Respiratory: Negative for cough and shortness of breath.   Cardiovascular: Negative for chest pain or palpitations.  Gastrointestinal: Negative for abdominal pain, no bowel  changes.  Musculoskeletal: Negative for gait problem or joint swelling.  Skin: Negative for rash.  Neurological: Negative for dizziness or headache.  No other specific complaints in a complete review of systems (except as listed in HPI above).      Objective:    LMP 02/08/2018 (Exact Date)   Wt Readings from Last 3 Encounters:  06/04/22 299 lb (135.6 kg)  03/23/22 (!) 307 lb 4.8 oz (139.4 kg)  12/04/21 (!) 309 lb (140.2 kg)    Physical Exam  Constitutional: Patient appears well-developed and well-nourished. Obese  No distress.  HEENT: head atraumatic, normocephalic, pupils equal and reactive to light,  neck supple Cardiovascular: Normal rate, regular rhythm and normal heart sounds.  No murmur heard. No BLE edema. Pulmonary/Chest: Effort normal and breath sounds normal. No respiratory distress. Abdominal: Soft.  There is no tenderness. Psychiatric: Patient has a normal mood and affect. behavior is normal. Judgment and thought content normal.   Results for orders placed or performed in visit on 03/23/22  Lipid panel  Result Value Ref Range   Cholesterol 183 <200 mg/dL   HDL 47 (L) > OR = 50 mg/dL   Triglycerides 409 <811 mg/dL   LDL Cholesterol (Calc) 112 (H) mg/dL (calc)   Total CHOL/HDL Ratio 3.9 <5.0 (calc)   Non-HDL Cholesterol (Calc) 136 (H) <130 mg/dL (calc)  CBC with Differential/Platelet  Result Value Ref Range   WBC 5.1 3.8 - 10.8 Thousand/uL   RBC 4.12 3.80 - 5.10 Million/uL   Hemoglobin 11.8 11.7 - 15.5 g/dL   HCT 91.4 (L) 78.2 - 95.6 %   MCV 82.5 80.0 -  100.0 fL   MCH 28.6 27.0 - 33.0 pg   MCHC 34.7 32.0 - 36.0 g/dL   RDW 16.1 09.6 - 04.5 %   Platelets 494 (H) 140 - 400 Thousand/uL   MPV 8.7 7.5 - 12.5 fL   Neutro Abs 3,213 1,500 - 7,800 cells/uL   Lymphs Abs 1,321 850 - 3,900 cells/uL   Absolute Monocytes 383 200 - 950 cells/uL   Eosinophils Absolute 122 15 - 500 cells/uL   Basophils Absolute 61 0 - 200 cells/uL   Neutrophils Relative % 63 %   Total  Lymphocyte 25.9 %   Monocytes Relative 7.5 %   Eosinophils Relative 2.4 %   Basophils Relative 1.2 %  COMPLETE METABOLIC PANEL WITH GFR  Result Value Ref Range   Glucose, Bld 79 65 - 99 mg/dL   BUN 13 7 - 25 mg/dL   Creat 4.09 8.11 - 9.14 mg/dL   eGFR 79 > OR = 60 NW/GNF/6.21H0   BUN/Creatinine Ratio SEE NOTE: 6 - 22 (calc)   Sodium 142 135 - 146 mmol/L   Potassium 3.6 3.5 - 5.3 mmol/L   Chloride 104 98 - 110 mmol/L   CO2 27 20 - 32 mmol/L   Calcium 9.8 8.6 - 10.2 mg/dL   Total Protein 7.5 6.1 - 8.1 g/dL   Albumin 4.3 3.6 - 5.1 g/dL   Globulin 3.2 1.9 - 3.7 g/dL (calc)   AG Ratio 1.3 1.0 - 2.5 (calc)   Total Bilirubin 0.3 0.2 - 1.2 mg/dL   Alkaline phosphatase (APISO) 54 31 - 125 U/L   AST 11 10 - 30 U/L   ALT 12 6 - 29 U/L  Hemoglobin A1c  Result Value Ref Range   Hgb A1c MFr Bld 5.9 (H) <5.7 % of total Hgb   Mean Plasma Glucose 123 mg/dL   eAG (mmol/L) 6.8 mmol/L      Assessment & Plan:   Problem List Items Addressed This Visit   None    Follow up plan: No follow-ups on file.

## 2022-09-30 ENCOUNTER — Encounter: Payer: Self-pay | Admitting: Nurse Practitioner

## 2022-09-30 ENCOUNTER — Ambulatory Visit: Payer: Medicaid Other | Admitting: Nurse Practitioner

## 2022-09-30 ENCOUNTER — Ambulatory Visit (INDEPENDENT_AMBULATORY_CARE_PROVIDER_SITE_OTHER): Payer: Commercial Managed Care - PPO | Admitting: Nurse Practitioner

## 2022-09-30 ENCOUNTER — Other Ambulatory Visit: Payer: Self-pay

## 2022-09-30 VITALS — BP 128/74 | HR 98 | Temp 97.8°F | Resp 16 | Ht 65.0 in | Wt 305.9 lb

## 2022-09-30 DIAGNOSIS — I10 Essential (primary) hypertension: Secondary | ICD-10-CM

## 2022-09-30 DIAGNOSIS — Z6841 Body Mass Index (BMI) 40.0 and over, adult: Secondary | ICD-10-CM | POA: Diagnosis not present

## 2022-09-30 DIAGNOSIS — E88819 Insulin resistance, unspecified: Secondary | ICD-10-CM | POA: Diagnosis not present

## 2022-09-30 DIAGNOSIS — C50411 Malignant neoplasm of upper-outer quadrant of right female breast: Secondary | ICD-10-CM

## 2022-09-30 DIAGNOSIS — R7303 Prediabetes: Secondary | ICD-10-CM

## 2022-09-30 DIAGNOSIS — E8881 Metabolic syndrome: Secondary | ICD-10-CM | POA: Diagnosis not present

## 2022-09-30 DIAGNOSIS — Z170 Estrogen receptor positive status [ER+]: Secondary | ICD-10-CM

## 2022-09-30 DIAGNOSIS — E782 Mixed hyperlipidemia: Secondary | ICD-10-CM

## 2022-09-30 NOTE — Assessment & Plan Note (Signed)
Working on lifestyle modification, [facility] placed [facility] 

## 2022-09-30 NOTE — Assessment & Plan Note (Signed)
her blood pressure today is 128/74 . She is taking valsartan-hydrochlorothiazide 160-12.5 mg daily and amlodipine 5 mg daily. Blood pressure at goal.

## 2022-09-30 NOTE — Assessment & Plan Note (Signed)
Doing well.  Managed by oncology

## 2022-09-30 NOTE — Assessment & Plan Note (Signed)
Was on wegovy, but insurance will no longer cover. Working on lifestyle modification, [facility]

## 2022-09-30 NOTE — Assessment & Plan Note (Signed)
Working on lifestyle modification, [facility]

## 2022-10-01 ENCOUNTER — Other Ambulatory Visit: Payer: Self-pay | Admitting: Nurse Practitioner

## 2022-10-01 ENCOUNTER — Other Ambulatory Visit: Payer: Self-pay

## 2022-10-01 ENCOUNTER — Encounter: Payer: Self-pay | Admitting: Nurse Practitioner

## 2022-10-01 DIAGNOSIS — R7303 Prediabetes: Secondary | ICD-10-CM

## 2022-10-01 LAB — LIPID PANEL
Cholesterol: 215 mg/dL — ABNORMAL HIGH (ref <200)
HDL: 50 mg/dL (ref >=50)
LDL Cholesterol (Calc): 143 mg/dL (calc) — ABNORMAL HIGH
Non-HDL Cholesterol (Calc): 165 mg/dL (calc) — ABNORMAL HIGH (ref <130)
Total CHOL/HDL Ratio: 4.3 (calc) (ref <5.0)
Triglycerides: 106 mg/dL (ref <150)

## 2022-10-01 LAB — HEMOGLOBIN A1C
Hgb A1c MFr Bld: 6 % of total Hgb — ABNORMAL HIGH (ref <5.7)
Mean Plasma Glucose: 126 mg/dL
eAG (mmol/L): 7 mmol/L

## 2022-10-01 MED ORDER — METFORMIN HCL 500 MG PO TABS
500.0000 mg | ORAL_TABLET | Freq: Two times a day (BID) | ORAL | 1 refills | Status: DC
Start: 2022-10-01 — End: 2023-03-28
  Filled 2022-10-01: qty 180, 90d supply, fill #0
  Filled 2022-12-30: qty 180, 90d supply, fill #1

## 2022-10-22 DIAGNOSIS — Z9011 Acquired absence of right breast and nipple: Secondary | ICD-10-CM | POA: Diagnosis not present

## 2022-10-22 DIAGNOSIS — C50811 Malignant neoplasm of overlapping sites of right female breast: Secondary | ICD-10-CM | POA: Diagnosis not present

## 2022-10-22 DIAGNOSIS — Z4431 Encounter for fitting and adjustment of external right breast prosthesis: Secondary | ICD-10-CM | POA: Diagnosis not present

## 2022-12-02 NOTE — Progress Notes (Deleted)
LMP 02/08/2018 (Exact Date)    Subjective:    Patient ID: Mckenzie Lopez, female    DOB: 09-20-1985, 37 y.o.   MRN: [id number]  HPI: Mckenzie Lopez is a 37 y.o. female  No chief complaint on file.  HTN: her blood pressure today is 128/74 . She is taking valsartan-hydrochlorothiazide 160-12.5 mg daily and amlodipine 5 mg daily. She denies any chest pain, blurred vision, headaches or shortness of breath.   Prediabetes: Her last A1C was 5.9 on 03/20/2022.  She denies any polyuria, polyphagia or polydipsia.  She was previously on metformin but did not tolerate it.  Hyperlipidemia: LDL was 112 on 03/20/2022.  She is not currently on cholesterol-lowering medication.  Recommend she continue to work on lifestyle modification.  Will get labs today.  Obesity/metabolic syndrome/insulin resistance: Her weight at the last appointment was 299 pounds with a BMI of 49.76. her weight today is 305 lbs with a BMI of 50.9. finished  Wegovy 2.4 mg weekly, her insurance will no longer cover it. Recommend working on lifestyle modification including eating well balanced diet with portion control and increasing physical activity as tolerated. [facility], referral placed.    Breast cancer: Note from oncology: pathologic stage IB pT3 (8 cm), pN2a (5/21), cM0, grade 2, ER + PR + HER2 -, IDC and associated DCIS of the RIGHT breast s/p RIGHT modified radical mastectomy on 01/05/18 who presents for post-treatment evaluation. The patient has been doing well since her procedure and has completed adjuvant chemotherapy and radiation and underwent TAH/BSO last year with no evidence of cancer.She is currently on arimidex. Last saw oncology on 09/21/2022. Patient reports she is doing well, she has two more infusions.    Relevant past medical, surgical, family and social history reviewed and updated as indicated. Interim medical history since our last visit reviewed. Allergies and  medications reviewed and updated.  Review of Systems  Constitutional: Negative for fever or weight change.  Respiratory: Negative for cough and shortness of breath.   Cardiovascular: Negative for chest pain or palpitations.  Gastrointestinal: Negative for abdominal pain, no bowel changes.  Musculoskeletal: Negative for gait problem or joint swelling.  Skin: Negative for rash.  Neurological: Negative for dizziness or headache.  No other specific complaints in a complete review of systems (except as listed in HPI above).      Objective:    LMP 02/08/2018 (Exact Date)   Wt Readings from Last 3 Encounters:  09/30/22 (!) 305 lb 14.4 oz (138.8 kg)  06/04/22 299 lb (135.6 kg)  03/23/22 (!) 307 lb 4.8 oz (139.4 kg)    Physical Exam  Constitutional: Patient appears well-developed and well-nourished. Obese  No distress.  HEENT: head atraumatic, normocephalic, pupils equal and reactive to light,  neck supple Cardiovascular: Normal rate, regular rhythm and normal heart sounds.  No murmur heard. No BLE edema. Pulmonary/Chest: Effort normal and breath sounds normal. No respiratory distress. Abdominal: Soft.  There is no tenderness. Psychiatric: Patient has a normal mood and affect. behavior is normal. Judgment and thought content normal.   Results for orders placed or performed in visit on 09/30/22  Lipid panel  Result Value Ref Range   Cholesterol 215 (H) <200 mg/dL   HDL 50 > OR = 50 mg/dL   Triglycerides 191 <478 mg/dL   LDL Cholesterol (Calc) 143 (H) mg/dL (calc)   Total CHOL/HDL Ratio 4.3 <5.0 (calc)   Non-HDL Cholesterol (Calc) 165 (H) <130 mg/dL (calc)  Hemoglobin G9F  Result Value Ref Range   Hgb A1c MFr Bld 6.0 (H) <5.7 % of total Hgb   Mean Plasma Glucose 126 mg/dL   eAG (mmol/L) 7.0 mmol/L      Assessment & Plan:   Problem List Items Addressed This Visit   None     Follow up plan: No follow-ups on file.

## 2022-12-03 ENCOUNTER — Ambulatory Visit: Payer: Commercial Managed Care - PPO | Admitting: Nurse Practitioner

## 2022-12-06 NOTE — Progress Notes (Unsigned)
No chief complaint on file.    HPI:      [name] is a 37 y.o. G1P1001 who LMP was Patient's last menstrual period was 02/08/2018 (exact date)., presents today for her annual examination. Her menses are absent since 10/19 chemo start and s/p lap hyst BSO 7/20.  Has tolerable vasomotor sx with arimidex, already on gabapentin. No PMB.   Sex activity: not sexually active; no vag sx. Last Pap: 11/08/18  Results were: no abnormalities /neg HPV DNA; s/p lap hyst BSO 7/20 Hx of STDs: chlamydia, CIN 1 in 2014   Last mammo: 01/06/21 LT side at [facility]; normal; repeat due in 12 months. Mammo 11/26/17 was Birads-5. Bx with Dr. Lemar Livings showed RT intraductal carcinoma with mets to axillary lymph nodes. Did RT mastectomy and chemo at [facility], now on arimidex for 10 yrs (changed from femara due to knee pain). 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 Lemar Livings; Vistaseq panel testing done 6/20 was negative.   There is a FH of breast cancer in her MGM and mat aunt, genetic testing not done by them. Pt is BRCA neg/Vistaseq panel neg 6/20. There is no FH of ovarian cancer. The patient does do self-breast exams.    Tobacco use: The patient denies current or previous tobacco use. Alcohol use: none No drug use Exercise: moderately active   She does get adequate calcium and Vitamin D in her diet (on Rx).   She has her labs managed with PCP.  DEXA 2021 and 2022 at [facility]. Osteoporosis in spine, normal hip.    Past Medical History:  Diagnosis Date  . BRCA negative 2019   BRCA with Dr. Lemar Livings, 7/20 Vistaseq panel neg with Helmut Muster Elza Varricchio, PA-C  . CHF (congestive heart failure) (HCC)    POSTPARTUM 2017/ RESOLVED  . Eczema   . 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 Hyperlipidemia   . Hypertension   . IFG (impaired fasting glucose)   . Intraductal carcinoma of right breast 11/2017   ER/PR+; Her2neu neg; mets to LN  . Low back pain   . Migraines   . Morbid obesity (HCC)   . Premature surgical  menopause 11/2018    Past Surgical History:  Procedure Laterality Date  . ABDOMINAL HYSTERECTOMY  11/28/2018   Full  . AXILLARY LYMPH NODE BIOPSY Right 12/01/2017   METASTATIC MAMMARY CARCINOMA  . AXILLARY LYMPH NODE BIOPSY Left 12/01/2017   NEGATIVE FOR MALIGNANCY  . BREAST BIOPSY Right 12/01/2017   11 and 12 o'clock, INVASIVE MAMMARY CARCINOMA, ER/PR positive HER2 negative  . MASTECTOMY Right 01/05/2018    Family History  Problem Relation Age of Onset  . Diabetes Mother   . Hypertension Mother   . Thyroid disease Mother   . Breast cancer Maternal Grandmother        ?age  . Diabetes Maternal Grandfather   . Hypertension Maternal Grandfather   . Seizures Maternal Grandfather   . Breast cancer Maternal Aunt        early 62's  . Diabetes Sister   . Diabetes Paternal Grandmother   . Heart disease Neg Hx   . Stroke Neg Hx   . COPD Neg Hx   . Ovarian cancer Neg Hx     Social History   Socioeconomic History  . Marital status: Single    Spouse name: Not on file  . Number of children: Not on file  . Years of education: Not on file  . Highest education level: 12th grade  Occupational History  . Not on file  Tobacco Use  . Smoking status: Never  . Smokeless tobacco: Never  Vaping Use  . Vaping status: Never Used  Substance and Sexual Activity  . Alcohol use: No  . Drug use: No  . Sexual activity: Not Currently    Birth control/protection: None, Surgical    Comment: Hysterectomy  Other Topics Concern  . Not on file  Social History Narrative  . Not on file   Social Determinants of Health   Financial Resource Strain: Medium Risk (09/26/2022)   Overall Financial Resource Strain (CARDIA)   . Difficulty of Paying Living Expenses: Somewhat hard  Food Insecurity: Food Insecurity Present (09/26/2022)   Hunger Vital Sign   . Worried About Programme researcher, broadcasting/film/video in the Last Year: Sometimes true   . Ran Out of Food in the Last Year: Patient declined  Transportation Needs:  No Transportation Needs (09/26/2022)   PRAPARE - Transportation   . Lack of Transportation (Medical): No   . Lack of Transportation (Non-Medical): No  Physical Activity: Insufficiently Active (09/26/2022)   Exercise Vital Sign   . Days of Exercise per Week: 2 days   . Minutes of Exercise per Session: 30 min  Stress: No Stress Concern Present (09/26/2022)   [name] of Occupational Health - Occupational Stress Questionnaire   . Feeling of Stress : Not at all  Social Connections: Unknown (09/26/2022)   Social Connection and Isolation Panel [NHANES]   . Frequency of Communication with Friends and Family: More than three times a week   . Frequency of Social Gatherings with Friends and Family: Once a week   . Attends Religious Services: More than 4 times per year   . Active Member of Clubs or Organizations: Yes   . Attends Banker Meetings: 1 to 4 times per year   . Marital Status: Not on file  Intimate Partner Violence: Not on file    Current Outpatient Medications on File Prior to Visit  Medication Sig Dispense Refill  . amLODipine (NORVASC) 5 MG tablet Take 1 tablet (5 mg total) by mouth daily. 90 tablet 3  . anastrozole (ARIMIDEX) 1 MG tablet Take by mouth.    . DULoxetine (CYMBALTA) 30 MG capsule Take 3 capsules (90 mg total) by mouth once daily 270 capsule 3  . fluticasone (FLONASE) 50 MCG/ACT nasal spray SHAKE LIQUID AND USE 2 SPRAYS IN EACH NOSTRIL DAILY AS NEEDED FOR ALLERGIES 48 g 0  . gabapentin (NEURONTIN) 300 MG capsule Take 300 mg by mouth 3 (three) times daily.    Marland Kitchen gabapentin (NEURONTIN) 300 MG capsule Take 1 capsule (300 mg total) by mouth at bedtime. 90 capsule 3  . loratadine (CLARITIN) 10 MG tablet Take 10 mg by mouth daily.    . metFORMIN (GLUCOPHAGE) 500 MG tablet Take 1 tablet (500 mg total) by mouth 2 (two) times daily with a meal. 180 tablet 1  . metoprolol succinate (TOPROL XL) 50 MG 24 hr tablet Take 1 tablet (50 mg total) by mouth daily. Take  with or immediately following a meal. 90 tablet 3  . Olopatadine HCl 0.2 % SOLN Place 1 drop into both eyes daily. 2.5 mL 11  . valsartan-hydrochlorothiazide (DIOVAN HCT) 160-12.5 MG tablet Take 1 tablet by mouth daily. 90 tablet 3   No current facility-administered medications on file prior to visit.    ROS:  Review of Systems  Constitutional:  Negative for fatigue, fever and unexpected weight change.  Respiratory:  Negative for cough, shortness of breath and wheezing.   Cardiovascular:  Negative for chest pain, palpitations and leg swelling.  Gastrointestinal:  Negative for blood in stool, constipation, diarrhea, nausea and vomiting.  Endocrine: Negative for cold intolerance, heat intolerance and polyuria.  Genitourinary:  Negative for dyspareunia, dysuria, flank pain, frequency, genital sores, hematuria, menstrual problem, pelvic pain, urgency, vaginal bleeding, vaginal discharge and vaginal pain.  Musculoskeletal:  Positive for arthralgias and joint swelling. Negative for back pain and myalgias.  Skin:  Negative for rash.  Neurological:  Positive for numbness. Negative for dizziness, syncope, light-headedness and headaches.  Hematological:  Negative for adenopathy.  Psychiatric/Behavioral:  Negative for agitation, confusion, sleep disturbance and suicidal ideas. The patient is not nervous/anxious.      Objective: LMP 02/08/2018 (Exact Date)    Physical Exam Constitutional:      Appearance: She is well-developed.  Genitourinary:     Vulva normal.     Genitourinary Comments: UTERUS/CX SURG REM     Right Labia: No rash, tenderness or lesions.    Left Labia: No tenderness, lesions or rash.    Vaginal cuff intact.    No vaginal discharge, erythema or tenderness.      Right Adnexa: not tender and no mass present.    Left Adnexa: not tender and no mass present.    Cervix is absent.     No cervical motion tenderness or polyp.     Uterus is not enlarged or tender.     Uterus is  absent.  Breasts:    Right: Absent. No mass, nipple discharge, skin change or tenderness.     Left: No mass, nipple discharge, skin change or tenderness.  Neck:     Thyroid: No thyromegaly.  Cardiovascular:     Rate and Rhythm: Normal rate and regular rhythm.     Heart sounds: Normal heart sounds. No murmur heard. Pulmonary:     Effort: Pulmonary effort is normal.     Breath sounds: Normal breath sounds.  Chest:     Comments: RT BREAST ABSENT Abdominal:     Palpations: Abdomen is soft.     Tenderness: There is no abdominal tenderness. There is no guarding.  Musculoskeletal:        General: Normal range of motion.     Cervical back: Normal range of motion.  Neurological:     General: No focal deficit present.     Mental Status: She is alert and oriented to person, place, and time.     Cranial Nerves: No cranial nerve deficit.  Skin:    General: Skin is warm and dry.  Psychiatric:        Mood and Affect: Mood normal.        Behavior: Behavior normal.        Thought Content: Thought content normal.        Judgment: Judgment normal.  Vitals reviewed.    Assessment/Plan:  Encounter for annual routine gynecological examination  Cervical cancer screening - Plan: Cytology - PAP  Screening for HPV (human papillomavirus) - Plan: Cytology - PAP  Encounter for screening mammogram for malignant neoplasm of breast; pt does mammos through [facility].   Malignant neoplasm of upper-outer quadrant of right breast in female, estrogen receptor positive (HCC)--managed at [facility]. Doing arimidex for 10 yrs. Doing well.   Premature surgical menopause--cont ca/Vit D/exercise. Current on DEXA. Can't have hormones.   GYN counsel breast self exam, mammography screening, adequate intake of calcium and vitamin D, diet and exercise  F/U  No follow-ups on file.  Shamone Winzer B. Kohl Polinsky, PA-C 12/06/2022 6:19 PM

## 2022-12-07 ENCOUNTER — Encounter: Payer: Self-pay | Admitting: Obstetrics and Gynecology

## 2022-12-07 ENCOUNTER — Ambulatory Visit (INDEPENDENT_AMBULATORY_CARE_PROVIDER_SITE_OTHER): Payer: Commercial Managed Care - PPO | Admitting: Obstetrics and Gynecology

## 2022-12-07 VITALS — BP 129/83 | HR 93 | Ht 65.0 in | Wt 300.0 lb

## 2022-12-07 DIAGNOSIS — Z1231 Encounter for screening mammogram for malignant neoplasm of breast: Secondary | ICD-10-CM

## 2022-12-07 DIAGNOSIS — Z01419 Encounter for gynecological examination (general) (routine) without abnormal findings: Secondary | ICD-10-CM | POA: Diagnosis not present

## 2022-12-07 DIAGNOSIS — Z170 Estrogen receptor positive status [ER+]: Secondary | ICD-10-CM

## 2022-12-07 NOTE — Patient Instructions (Addendum)
I value your feedback and you entrusting us with your care. If you get a [facility] patient survey, I would appreciate you taking the time to let us know about your experience today. Thank you! ? ? ?

## 2022-12-09 ENCOUNTER — Encounter (INDEPENDENT_AMBULATORY_CARE_PROVIDER_SITE_OTHER): Payer: Commercial Managed Care - PPO | Admitting: Family Medicine

## 2022-12-09 ENCOUNTER — Ambulatory Visit: Payer: Commercial Managed Care - PPO | Admitting: Nurse Practitioner

## 2022-12-09 NOTE — Progress Notes (Deleted)
LMP 02/08/2018 (Exact Date)    Subjective:    Patient ID: Mckenzie Lopez, female    DOB: 10/23/1985, 36 y.o.   MRN: [id number]  HPI: Mckenzie Lopez is a 37 y.o. female  No chief complaint on file.  Hypertension:  -Medications: valsartan-hydrochlorothiazide 160-12.5 mg daily, amlodipine 5 mg daily, metoprolol 50 mg daily -Patient is compliant with above medications and reports no side effects. -Checking BP at home (average): *** -Highest BP at home: *** -Lowest BP at home: *** -Denies any SOB, CP, vision changes, LE edema or symptoms of hypotension -Diet: *** -Exercise: ***   Prediabetes:  -Last A1c 6.0 -Medications: metformin 500 mg BID -Patient is compliant with the above medications and reports no side effects. ***  HLD:  -Medications: none -Patient is compliant with working on lifestyle modification -Last lipid panel:  Lipid Panel     Component Value Date/Time   CHOL 215 (H) 09/30/2022 1139   CHOL 192 02/25/2015 0848   TRIG 106 09/30/2022 1139   HDL 50 09/30/2022 1139   HDL 46 02/25/2015 0848   CHOLHDL 4.3 09/30/2022 1139   VLDL 14 09/07/2016 0914   LDLCALC 143 (H) 09/30/2022 1139   LABVLDL 17 02/25/2015 0848     Obesity:  Current weight : *** BMI: *** previous weight:305 lbs Treatment Tried: was on wegovy, insurance stopped covering, currently on metformin, referred her to weight and wellness program Comorbidities: prediabetes, HLD, HTN     Breast cancer: Note from oncology: pathologic stage IB pT3 (8 cm), pN2a (5/21), cM0, grade 2, ER + PR + HER2 -, IDC and associated DCIS of the RIGHT breast s/p RIGHT modified radical mastectomy on 01/05/18 who presents for post-treatment evaluation. The patient has been doing well since her procedure and has completed adjuvant chemotherapy and radiation and underwent TAH/BSO last year with no evidence of cancer.She is currently on arimidex. Last saw oncology on 09/21/2022. Patient reports she is doing well,  she has two more infusions.    Relevant past medical, surgical, family and social history reviewed and updated as indicated. Interim medical history since our last visit reviewed. Allergies and medications reviewed and updated.  Review of Systems  Constitutional: Negative for fever or weight change.  Respiratory: Negative for cough and shortness of breath.   Cardiovascular: Negative for chest pain or palpitations.  Gastrointestinal: Negative for abdominal pain, no bowel changes.  Musculoskeletal: Negative for gait problem or joint swelling.  Skin: Negative for rash.  Neurological: Negative for dizziness or headache.  No other specific complaints in a complete review of systems (except as listed in HPI above).      Objective:    LMP 02/08/2018 (Exact Date)   Wt Readings from Last 3 Encounters:  12/07/22 300 lb (136.1 kg)  09/30/22 (!) 305 lb 14.4 oz (138.8 kg)  06/04/22 299 lb (135.6 kg)    Physical Exam  Constitutional: Patient appears well-developed and well-nourished. Obese  No distress.  HEENT: head atraumatic, normocephalic, pupils equal and reactive to light,  neck supple Cardiovascular: Normal rate, regular rhythm and normal heart sounds.  No murmur heard. No BLE edema. Pulmonary/Chest: Effort normal and breath sounds normal. No respiratory distress. Abdominal: Soft.  There is no tenderness. Psychiatric: Patient has a normal mood and affect. behavior is normal. Judgment and thought content normal.   Results for orders placed or performed in visit on 09/30/22  Lipid panel  Result Value Ref Range   Cholesterol 215 (H) <200 mg/dL   HDL 50 >  OR = 50 mg/dL   Triglycerides 604 <540 mg/dL   LDL Cholesterol (Calc) 143 (H) mg/dL (calc)   Total CHOL/HDL Ratio 4.3 <5.0 (calc)   Non-HDL Cholesterol (Calc) 165 (H) <130 mg/dL (calc)  Hemoglobin J8J  Result Value Ref Range   Hgb A1c MFr Bld 6.0 (H) <5.7 % of total Hgb   Mean Plasma Glucose 126 mg/dL   eAG (mmol/L) 7.0 mmol/L       Assessment & Plan:   Problem List Items Addressed This Visit   None     Follow up plan: No follow-ups on file.

## 2022-12-20 ENCOUNTER — Other Ambulatory Visit: Payer: Self-pay | Admitting: Nurse Practitioner

## 2022-12-21 ENCOUNTER — Other Ambulatory Visit: Payer: Self-pay

## 2022-12-21 MED ORDER — ANASTROZOLE 1 MG PO TABS
1.0000 mg | ORAL_TABLET | Freq: Every day | ORAL | 4 refills | Status: DC
  Filled 2022-12-21: qty 90, 90d supply, fill #0
  Filled 2023-03-16: qty 90, 90d supply, fill #1
  Filled 2023-06-14: qty 90, 90d supply, fill #2
  Filled 2023-09-14: qty 90, 90d supply, fill #3

## 2022-12-30 ENCOUNTER — Other Ambulatory Visit: Payer: Self-pay

## 2022-12-30 MED ORDER — ERGOCALCIFEROL 1.25 MG (50000 UT) PO CAPS
50000.0000 [IU] | ORAL_CAPSULE | ORAL | 0 refills | Status: DC
  Filled 2022-12-30 – 2023-01-25 (×2): qty 12, 84d supply, fill #0

## 2023-01-14 DIAGNOSIS — Z170 Estrogen receptor positive status [ER+]: Secondary | ICD-10-CM | POA: Diagnosis not present

## 2023-01-14 DIAGNOSIS — C50811 Malignant neoplasm of overlapping sites of right female breast: Secondary | ICD-10-CM | POA: Diagnosis not present

## 2023-01-14 DIAGNOSIS — Z1239 Encounter for other screening for malignant neoplasm of breast: Secondary | ICD-10-CM | POA: Diagnosis not present

## 2023-01-14 DIAGNOSIS — R92322 Mammographic fibroglandular density, left breast: Secondary | ICD-10-CM | POA: Diagnosis not present

## 2023-01-14 DIAGNOSIS — Z9011 Acquired absence of right breast and nipple: Secondary | ICD-10-CM | POA: Diagnosis not present

## 2023-01-14 DIAGNOSIS — Z1231 Encounter for screening mammogram for malignant neoplasm of breast: Secondary | ICD-10-CM | POA: Diagnosis not present

## 2023-01-15 ENCOUNTER — Other Ambulatory Visit: Payer: Self-pay

## 2023-01-26 ENCOUNTER — Other Ambulatory Visit: Payer: Self-pay

## 2023-02-10 DIAGNOSIS — H35033 Hypertensive retinopathy, bilateral: Secondary | ICD-10-CM | POA: Diagnosis not present

## 2023-03-14 ENCOUNTER — Other Ambulatory Visit: Payer: Self-pay

## 2023-03-14 ENCOUNTER — Other Ambulatory Visit: Payer: Self-pay | Admitting: Nurse Practitioner

## 2023-03-14 DIAGNOSIS — I10 Essential (primary) hypertension: Secondary | ICD-10-CM

## 2023-03-15 ENCOUNTER — Other Ambulatory Visit: Payer: Self-pay

## 2023-03-16 ENCOUNTER — Other Ambulatory Visit: Payer: Self-pay

## 2023-03-16 MED FILL — Valsartan-Hydrochlorothiazide Tab 160-12.5 MG: ORAL | 90 days supply | Qty: 90 | Fill #0 | Status: AC

## 2023-03-16 MED FILL — Amlodipine Besylate Tab 5 MG (Base Equivalent): ORAL | 90 days supply | Qty: 90 | Fill #0 | Status: AC

## 2023-03-16 MED FILL — Metoprolol Succinate Tab ER 24HR 50 MG (Tartrate Equiv): ORAL | 90 days supply | Qty: 90 | Fill #0 | Status: AC

## 2023-03-16 NOTE — Telephone Encounter (Signed)
Requested Prescriptions  Pending Prescriptions Disp Refills   amLODipine (NORVASC) 5 MG tablet 90 tablet 0    Sig: Take 1 tablet (5 mg total) by mouth daily.     Cardiovascular: Calcium Channel Blockers 2 Passed - 03/14/2023  8:01 PM      Passed - Last BP in normal range    BP Readings from Last 1 Encounters:  12/07/22 129/83         Passed - Last Heart Rate in normal range    Pulse Readings from Last 1 Encounters:  12/07/22 93         Passed - Valid encounter within last 6 months    Recent Outpatient Visits           5 months ago Essential hypertension   [facility] Berniece Salines, FNP   9 months ago Essential hypertension   [facility] Berniece Salines, FNP   11 months ago Essential hypertension   [facility] Berniece Salines, FNP   1 year ago Essential hypertension   [facility] Berniece Salines, FNP   1 year ago Essential hypertension   [facility] Berniece Salines, FNP       Future Appointments             In 2 weeks Berniece Salines, FNP [facility]             metoprolol succinate (TOPROL-XL) 50 MG 24 hr tablet [Pharmacy Med Name: metoprolol succinate (TOPROL XL) 50 MG 24 hr tablet] 90 tablet 0    Sig: Take 1 tablet (50 mg total) by mouth daily. Take with or immediately following a meal.     Cardiovascular:  Beta Blockers Passed - 03/14/2023  8:01 PM      Passed - Last BP in normal range    BP Readings from Last 1 Encounters:  12/07/22 129/83         Passed - Last Heart Rate in normal range    Pulse Readings from Last 1 Encounters:  12/07/22 93         Passed - Valid encounter within last 6 months    Recent Outpatient Visits           5 months ago Essential hypertension   [facility] Berniece Salines, FNP   9 months ago Essential hypertension   [facility] Berniece Salines, FNP   11 months ago Essential hypertension   [facility] Berniece Salines, FNP   1 year ago Essential hypertension   [facility] Berniece Salines, FNP   1 year ago Essential hypertension   [facility] Berniece Salines, FNP       Future Appointments             In 2 weeks Zane Herald Rudolpho Sevin, FNP [facility]             valsartan-hydrochlorothiazide (DIOVAN-HCT) 160-12.5 MG tablet [Pharmacy Med Name: valsartan-hydrochlorothiazide (DIOVAN HCT) 160-12.5 MG tablet] 90 tablet 0    Sig: Take 1 tablet by mouth daily.     Cardiovascular: ARB + Diuretic Combos Failed - 03/14/2023  8:01 PM      Failed - K in normal range and within 180 days    Potassium  Date  Value Ref Range Status  03/23/2022 3.6 3.5 - 5.3 mmol/L Final         Failed - Na in normal range and within 180 days    Sodium  Date Value Ref Range Status  03/23/2022 142 135 - 146 mmol/L Final  02/25/2015 140 136 - 144 mmol/L Final    Comment:                  **Please note reference interval change**         Failed - Cr in normal range and within 180 days    Creat  Date Value Ref Range Status  03/23/2022 0.95 0.50 - 0.97 mg/dL Final   Creatinine, Urine  Date Value Ref Range Status  11/13/2015 <10 mg/dL Final         Failed - eGFR is 10 or above and within 180 days    GFR, Est African American  Date Value Ref Range Status  09/18/2020 85 > OR = 60 mL/min/1.71m2 Final   GFR, Est Non African American  Date Value Ref Range Status  09/18/2020 73 > OR = 60 mL/min/1.42m2 Final   eGFR  Date Value Ref Range Status  03/23/2022 79 > OR = 60 mL/min/1.32m2 Final         Passed - Patient is not pregnant      Passed - Last BP in normal range    BP Readings from Last 1 Encounters:  12/07/22 129/83         Passed - Valid encounter within last 6 months    Recent  Outpatient Visits           5 months ago Essential hypertension   [facility] Berniece Salines, FNP   9 months ago Essential hypertension   [facility] Berniece Salines, FNP   11 months ago Essential hypertension   [facility] Berniece Salines, FNP   1 year ago Essential hypertension   [facility] Berniece Salines, FNP   1 year ago Essential hypertension   [facility] Berniece Salines, FNP       Future Appointments             In 2 weeks Zane Herald, Rudolpho Sevin, FNP [facility]

## 2023-03-24 ENCOUNTER — Ambulatory Visit: Payer: Self-pay

## 2023-03-24 DIAGNOSIS — C50811 Malignant neoplasm of overlapping sites of right female breast: Secondary | ICD-10-CM | POA: Diagnosis not present

## 2023-03-24 DIAGNOSIS — Z170 Estrogen receptor positive status [ER+]: Secondary | ICD-10-CM | POA: Diagnosis not present

## 2023-03-24 NOTE — Progress Notes (Unsigned)
   LMP 02/08/2018 (Exact Date)    Subjective:    Patient ID: Mckenzie Lopez, female    DOB: [birth year], 37 y.o.   MRN: [id number]  HPI: Mckenzie Lopez is a 37 y.o. female  No chief complaint on file.  Elevated glucose/polydipsia: patient recent glucose level was 239.  Last A1C was back in May and was 6.0.  A1C today is ***  Relevant past medical, surgical, family and social history reviewed and updated as indicated. Interim medical history since our last visit reviewed. Allergies and medications reviewed and updated.  Review of Systems  Constitutional: Negative for fever or weight change.  Respiratory: Negative for cough and shortness of breath.   Cardiovascular: Negative for chest pain or palpitations.  Gastrointestinal: Negative for abdominal pain, no bowel changes.  Musculoskeletal: Negative for gait problem or joint swelling.  Skin: Negative for rash.  Neurological: Negative for dizziness or headache.  No other specific complaints in a complete review of systems (except as listed in HPI above).      Objective:    LMP 02/08/2018 (Exact Date)   Wt Readings from Last 3 Encounters:  12/07/22 300 lb (136.1 kg)  09/30/22 (!) 305 lb 14.4 oz (138.8 kg)  06/04/22 299 lb (135.6 kg)    Physical Exam  Constitutional: Patient appears well-developed and well-nourished. Obese *** No distress.  HEENT: head atraumatic, normocephalic, pupils equal and reactive to light, ears ***, neck supple, throat within normal limits Cardiovascular: Normal rate, regular rhythm and normal heart sounds.  No murmur heard. No BLE edema. Pulmonary/Chest: Effort normal and breath sounds normal. No respiratory distress. Abdominal: Soft.  There is no tenderness. Psychiatric: Patient has a normal mood and affect. behavior is normal. Judgment and thought content normal.  Results for orders placed or performed in visit on 09/30/22  Lipid panel  Result Value Ref Range   Cholesterol 215 (H) <200  mg/dL   HDL 50 > OR = 50 mg/dL   Triglycerides 696 <295 mg/dL   LDL Cholesterol (Calc) 143 (H) mg/dL (calc)   Total CHOL/HDL Ratio 4.3 <5.0 (calc)   Non-HDL Cholesterol (Calc) 165 (H) <130 mg/dL (calc)  Hemoglobin M8U  Result Value Ref Range   Hgb A1c MFr Bld 6.0 (H) <5.7 % of total Hgb   Mean Plasma Glucose 126 mg/dL   eAG (mmol/L) 7.0 mmol/L      Assessment & Plan:   Problem List Items Addressed This Visit   None    Follow up plan: No follow-ups on file.

## 2023-03-24 NOTE — Telephone Encounter (Signed)
FYI

## 2023-03-24 NOTE — Telephone Encounter (Signed)
Summary: Higher glucose levels   Patient had an appointment today with Oncologist. Oncologist advised patient to reach out to practice & schedule an appointment with PCP due to glucose levels being higher than normal. Patient states the only symptoms she's had is being thirsty at night time. Please reference oncology labs from [facility].      Left message to call back about symptoms.

## 2023-03-24 NOTE — Telephone Encounter (Signed)
Chief Complaint: Elevated blood sugar  Symptoms: Increased thirst  Frequency: At night  Pertinent Negatives: Patient denies increase urine output,  Disposition: [] ED /[] Urgent Care (no appt availability in office) / [x] Appointment(In office/virtual)/ []  [facility]/ [] Home Care/ [] Refused Recommended Disposition /[] [place] [facility]/ []  Follow-up with PCP Additional Notes: Patient stated she was seen at the Oncologist office today and was advised that her Glucose lab was elevated today more than usual. Patient is not diabetic and her reading was 239 fasting. Oncologist recommended a follow-up with PCP. Patient reported increased thirst mainly at night as the only symptom she has noticed. Patient was given care advice and has been scheduled for an appointment tomorrow with PCP. Summary: Higher glucose levels   Patient had an appointment today with Oncologist. Oncologist advised patient to reach out to practice & schedule an appointment with PCP due to glucose levels being higher than normal. Patient states the only symptoms she's had is being thirsty at night time. Please reference oncology labs from [facility].     Reason for Disposition  [1] Symptoms of high blood sugar (e.g., abnormally thirsty, frequent urination, weight loss) AND [2] not able to test blood glucose  Answer Assessment - Initial Assessment Questions 1. BLOOD GLUCOSE: "What is your blood glucose level?"      239 2. ONSET: "When did you check the blood glucose?"     Today 3. USUAL RANGE: "What is your glucose level usually?" (e.g., usual fasting morning value, usual evening value)     Unsure  4. KETONES: "Do you check for ketones (urine or blood test strips)?" If Yes, ask: "What does the test show now?"      N/A 5. TYPE 1 or 2:  "Do you know what type of diabetes you have?"  (e.g., Type 1, Type 2, Gestational; doesn't know)      Not diabetic  6. INSULIN: "Do you take insulin?" "What type of insulin(s) do you use?  What is the mode of delivery? (syringe, pen; injection or pump)?"      None 7. DIABETES PILLS: "Do you take any pills for your diabetes?" If Yes, ask: "Have you missed taking any pills recently?"     Metformin for weight loss  8. OTHER SYMPTOMS: "Do you have any symptoms?" (e.g., fever, frequent urination, difficulty breathing, dizziness, weakness, vomiting)     Thirsty at night  Protocols used: Diabetes - High Blood Sugar-A-AH

## 2023-03-25 ENCOUNTER — Ambulatory Visit: Payer: Commercial Managed Care - PPO | Admitting: Nurse Practitioner

## 2023-03-25 ENCOUNTER — Other Ambulatory Visit: Payer: Self-pay

## 2023-03-25 ENCOUNTER — Other Ambulatory Visit (HOSPITAL_COMMUNITY): Payer: Self-pay

## 2023-03-25 ENCOUNTER — Encounter: Payer: Self-pay | Admitting: Nurse Practitioner

## 2023-03-25 VITALS — BP 128/74 | HR 98 | Temp 97.8°F | Resp 16 | Ht 65.0 in | Wt 322.4 lb

## 2023-03-25 DIAGNOSIS — R631 Polydipsia: Secondary | ICD-10-CM

## 2023-03-25 DIAGNOSIS — E119 Type 2 diabetes mellitus without complications: Secondary | ICD-10-CM

## 2023-03-25 DIAGNOSIS — R7303 Prediabetes: Secondary | ICD-10-CM

## 2023-03-25 DIAGNOSIS — Z7984 Long term (current) use of oral hypoglycemic drugs: Secondary | ICD-10-CM | POA: Diagnosis not present

## 2023-03-25 DIAGNOSIS — R739 Hyperglycemia, unspecified: Secondary | ICD-10-CM

## 2023-03-25 LAB — POCT GLYCOSYLATED HEMOGLOBIN (HGB A1C): Hemoglobin A1C: 9.8 % — AB (ref 4.0–5.6)

## 2023-03-25 MED ORDER — LANCETS 30G MISC
1.0000 | Freq: Three times a day (TID) | 0 refills | Status: AC
  Filled 2023-03-25: qty 100, 30d supply, fill #0

## 2023-03-25 MED ORDER — BLOOD GLUCOSE TEST VI STRP
1.0000 | ORAL_STRIP | Freq: Three times a day (TID) | 0 refills | Status: AC
  Filled 2023-03-25: qty 100, 30d supply, fill #0

## 2023-03-25 MED ORDER — TIRZEPATIDE 2.5 MG/0.5ML ~~LOC~~ SOAJ
2.5000 mg | SUBCUTANEOUS | 0 refills | Status: DC
  Filled 2023-03-25: qty 2, 28d supply, fill #0

## 2023-03-25 MED ORDER — LANCET DEVICE MISC
1.0000 | Freq: Three times a day (TID) | 0 refills | Status: AC
  Filled 2023-03-25 – 2023-03-28 (×2): qty 1, 30d supply, fill #0

## 2023-03-25 MED ORDER — BLOOD GLUCOSE MONITOR SYSTEM W/DEVICE KIT
1.0000 | PACK | Freq: Three times a day (TID) | 0 refills | Status: AC
  Filled 2023-03-25: qty 1, 30d supply, fill #0

## 2023-03-25 NOTE — Assessment & Plan Note (Addendum)
Continue metformin 500 mg BID start mounjaro 2.5 mg weekly.  Start monitoring blood sugar and keep log, bring to next appointment. Follow up in 3 months.

## 2023-03-28 ENCOUNTER — Other Ambulatory Visit: Payer: Self-pay

## 2023-03-28 ENCOUNTER — Other Ambulatory Visit: Payer: Self-pay | Admitting: Nurse Practitioner

## 2023-03-28 DIAGNOSIS — R7303 Prediabetes: Secondary | ICD-10-CM

## 2023-03-29 ENCOUNTER — Other Ambulatory Visit: Payer: Self-pay

## 2023-03-29 MED ORDER — DULOXETINE HCL 30 MG PO CPEP
90.0000 mg | ORAL_CAPSULE | Freq: Every day | ORAL | 3 refills | Status: DC
  Filled 2023-03-29: qty 270, 90d supply, fill #0
  Filled 2023-06-27: qty 270, 90d supply, fill #1
  Filled 2023-09-23: qty 270, 90d supply, fill #2
  Filled 2023-12-22: qty 270, 90d supply, fill #3

## 2023-03-29 NOTE — Telephone Encounter (Signed)
Requested medications are due for refill today.  yes  Requested medications are on the active medications list.  yes  Last refill. 10/01/2022 #180 1 rf  Future visit scheduled.   yes  Notes to clinic.  Expired labs.    Requested Prescriptions  Pending Prescriptions Disp Refills   metFORMIN (GLUCOPHAGE) 500 MG tablet 180 tablet 1    Sig: Take 1 tablet (500 mg total) by mouth 2 (two) times daily with a meal.     Endocrinology:  Diabetes - Biguanides Failed - 03/28/2023  9:18 PM      Failed - Cr in normal range and within 360 days    Creat  Date Value Ref Range Status  03/23/2022 0.95 0.50 - 0.97 mg/dL Final   Creatinine, Urine  Date Value Ref Range Status  11/13/2015 <10 mg/dL Final         Failed - HBA1C is between 0 and 7.9 and within 180 days    Hemoglobin A1C  Date Value Ref Range Status  03/25/2023 9.8 (A) 4.0 - 5.6 % Final   Hgb A1c MFr Bld  Date Value Ref Range Status  09/30/2022 6.0 (H) <5.7 % of total Hgb Final    Comment:    For someone without known diabetes, a hemoglobin  A1c value between 5.7% and 6.4% is consistent with prediabetes and should be confirmed with a  follow-up test. . For someone with known diabetes, a value <7% indicates that their diabetes is well controlled. A1c targets should be individualized based on duration of diabetes, age, comorbid conditions, and other considerations. . This assay result is consistent with an increased risk of diabetes. . Currently, no consensus exists regarding use of hemoglobin A1c for diagnosis of diabetes for children. .          Failed - eGFR in normal range and within 360 days    GFR, Est African American  Date Value Ref Range Status  09/18/2020 85 > OR = 60 mL/min/1.65m2 Final   GFR, Est Non African American  Date Value Ref Range Status  09/18/2020 73 > OR = 60 mL/min/1.59m2 Final   eGFR  Date Value Ref Range Status  03/23/2022 79 > OR = 60 mL/min/1.81m2 Final         Failed - B12 Level  in normal range and within 720 days    No results found for: "VITAMINB12"       Failed - CBC within normal limits and completed in the last 12 months    WBC  Date Value Ref Range Status  03/23/2022 5.1 3.8 - 10.8 Thousand/uL Final   RBC  Date Value Ref Range Status  03/23/2022 4.12 3.80 - 5.10 Million/uL Final   Hemoglobin  Date Value Ref Range Status  03/23/2022 11.8 11.7 - 15.5 g/dL Final  46/96/2952 84.1 11.1 - 15.9 g/dL Final   HCT  Date Value Ref Range Status  03/23/2022 34.0 (L) 35.0 - 45.0 % Final   Hematocrit  Date Value Ref Range Status  02/25/2015 36.5 34.0 - 46.6 % Final   MCHC  Date Value Ref Range Status  03/23/2022 34.7 32.0 - 36.0 g/dL Final   [facility]  Date Value Ref Range Status  03/23/2022 28.6 27.0 - 33.0 pg Final   MCV  Date Value Ref Range Status  03/23/2022 82.5 80.0 - 100.0 fL Final  02/25/2015 79 79 - 97 fL Final   No results found for: "PLTCOUNTKUC", "LABPLAT", "POCPLA" RDW  Date Value Ref Range Status  03/23/2022  14.5 11.0 - 15.0 % Final  02/25/2015 15.9 (H) 12.3 - 15.4 % Final         Passed - Valid encounter within last 6 months    Recent Outpatient Visits           4 days ago New onset type 2 diabetes mellitus [facility])   [facility] Berniece Salines, FNP   6 months ago Essential hypertension   [facility] Berniece Salines, FNP   9 months ago Essential hypertension   [facility] Berniece Salines, FNP   1 year ago Essential hypertension   [facility] Berniece Salines, FNP   1 year ago Essential hypertension   [facility] Berniece Salines, FNP       Future Appointments             In 2 months Zane Herald, Rudolpho Sevin, FNP [facility], [facility]

## 2023-03-30 ENCOUNTER — Other Ambulatory Visit: Payer: Self-pay

## 2023-03-31 ENCOUNTER — Other Ambulatory Visit: Payer: Self-pay

## 2023-03-31 MED ORDER — METFORMIN HCL 500 MG PO TABS
500.0000 mg | ORAL_TABLET | Freq: Two times a day (BID) | ORAL | 1 refills | Status: DC
  Filled 2023-03-31: qty 180, 90d supply, fill #0

## 2023-04-02 ENCOUNTER — Ambulatory Visit: Payer: Commercial Managed Care - PPO | Admitting: Nurse Practitioner

## 2023-04-05 ENCOUNTER — Other Ambulatory Visit: Payer: Self-pay

## 2023-04-05 ENCOUNTER — Other Ambulatory Visit: Payer: Commercial Managed Care - PPO

## 2023-04-11 ENCOUNTER — Encounter: Payer: Self-pay | Admitting: Nurse Practitioner

## 2023-04-12 ENCOUNTER — Other Ambulatory Visit: Payer: Self-pay | Admitting: Nurse Practitioner

## 2023-04-12 ENCOUNTER — Other Ambulatory Visit: Payer: Self-pay

## 2023-04-12 DIAGNOSIS — E119 Type 2 diabetes mellitus without complications: Secondary | ICD-10-CM

## 2023-04-12 MED ORDER — TIRZEPATIDE 5 MG/0.5ML ~~LOC~~ SOAJ
5.0000 mg | SUBCUTANEOUS | 0 refills | Status: DC
  Filled 2023-04-12: qty 2, 28d supply, fill #0

## 2023-04-19 ENCOUNTER — Other Ambulatory Visit: Payer: Self-pay

## 2023-04-29 ENCOUNTER — Encounter: Payer: Self-pay | Admitting: Nurse Practitioner

## 2023-05-16 ENCOUNTER — Encounter: Payer: Self-pay | Admitting: Nurse Practitioner

## 2023-05-17 ENCOUNTER — Other Ambulatory Visit: Payer: Self-pay | Admitting: Nurse Practitioner

## 2023-05-17 ENCOUNTER — Other Ambulatory Visit: Payer: Self-pay

## 2023-05-17 DIAGNOSIS — E119 Type 2 diabetes mellitus without complications: Secondary | ICD-10-CM

## 2023-05-17 MED ORDER — TIRZEPATIDE 7.5 MG/0.5ML ~~LOC~~ SOAJ
7.5000 mg | SUBCUTANEOUS | 0 refills | Status: DC
  Filled 2023-05-17: qty 2, 28d supply, fill #0

## 2023-05-31 ENCOUNTER — Other Ambulatory Visit: Payer: Self-pay

## 2023-06-03 ENCOUNTER — Encounter: Payer: Self-pay | Admitting: Nurse Practitioner

## 2023-06-04 ENCOUNTER — Other Ambulatory Visit: Payer: Self-pay

## 2023-06-04 ENCOUNTER — Other Ambulatory Visit: Payer: Self-pay | Admitting: Nurse Practitioner

## 2023-06-04 DIAGNOSIS — E119 Type 2 diabetes mellitus without complications: Secondary | ICD-10-CM

## 2023-06-04 MED ORDER — TIRZEPATIDE 10 MG/0.5ML ~~LOC~~ SOAJ
10.0000 mg | SUBCUTANEOUS | 0 refills | Status: DC
  Filled 2023-06-04: qty 2, 28d supply, fill #0

## 2023-06-07 ENCOUNTER — Other Ambulatory Visit: Payer: Self-pay

## 2023-06-09 ENCOUNTER — Other Ambulatory Visit: Payer: Self-pay

## 2023-06-12 ENCOUNTER — Other Ambulatory Visit: Payer: Self-pay | Admitting: Nurse Practitioner

## 2023-06-12 DIAGNOSIS — I10 Essential (primary) hypertension: Secondary | ICD-10-CM

## 2023-06-14 ENCOUNTER — Other Ambulatory Visit: Payer: Self-pay | Admitting: Nurse Practitioner

## 2023-06-14 ENCOUNTER — Other Ambulatory Visit (HOSPITAL_BASED_OUTPATIENT_CLINIC_OR_DEPARTMENT_OTHER): Payer: Self-pay

## 2023-06-14 ENCOUNTER — Other Ambulatory Visit: Payer: Self-pay

## 2023-06-14 ENCOUNTER — Encounter: Payer: Self-pay | Admitting: Nurse Practitioner

## 2023-06-14 DIAGNOSIS — I10 Essential (primary) hypertension: Secondary | ICD-10-CM

## 2023-06-15 ENCOUNTER — Other Ambulatory Visit: Payer: Self-pay | Admitting: Nurse Practitioner

## 2023-06-15 ENCOUNTER — Other Ambulatory Visit: Payer: Self-pay

## 2023-06-15 ENCOUNTER — Telehealth: Payer: Self-pay | Admitting: Nurse Practitioner

## 2023-06-15 DIAGNOSIS — I10 Essential (primary) hypertension: Secondary | ICD-10-CM

## 2023-06-15 MED FILL — Amlodipine Besylate Tab 5 MG (Base Equivalent): ORAL | 90 days supply | Qty: 90 | Fill #0 | Status: AC

## 2023-06-15 MED FILL — Metoprolol Succinate Tab ER 24HR 50 MG (Tartrate Equiv): ORAL | 90 days supply | Qty: 90 | Fill #0 | Status: AC

## 2023-06-15 NOTE — Telephone Encounter (Signed)
 Medication Refill -  Most Recent Primary Care Visit:  Provider: PENDER, JULIE F  Department: [facility]  Visit Type: OFFICE VISIT  Date: 03/25/2023  Medication:  amLODIPine  Besylate 5 mg Oral Daily Metoprolol  Succinate 50 mg Oral Daily, Take with or immediately following a meal. Valsartan -hydroCHLOROthiazide  1 tablet Oral Daily Two show sent in today but pt just spoke to pharmacy and they did not have prescriptions.  Has the patient contacted their pharmacy? Yes Pt requested via pharmacy and still has not heard back. Told to contact office.   Is this the correct pharmacy for this prescription? Yes This is the patient's preferred pharmacy:  [facility] [address] [place] [address] Phone: [phone] Fax: [phone]  Has the prescription been filled recently? No  Is the patient out of the medication? No  Has the patient been seen for an appointment in the last year OR does the patient have an upcoming appointment? Yes  Can we respond through MyChart? Yes  Agent: Please be advised that Rx refills may take up to 3 business days. We ask that you follow-up with your pharmacy.

## 2023-06-15 NOTE — Telephone Encounter (Signed)
 Requested Prescriptions  Pending Prescriptions Disp Refills   amLODipine  (NORVASC ) 5 MG tablet 90 tablet 0    Sig: Take 1 tablet (5 mg total) by mouth daily.     Cardiovascular: Calcium Channel Blockers 2 Passed - 06/15/2023  4:15 PM      Passed - Last BP in normal range    BP Readings from Last 1 Encounters:  03/25/23 128/74         Passed - Last Heart Rate in normal range    Pulse Readings from Last 1 Encounters:  03/25/23 98         Passed - Valid encounter within last 6 months    Recent Outpatient Visits           2 months ago New onset type 2 diabetes mellitus [facility])   [place] Gareth Mliss FALCON, FNP   8 months ago Essential hypertension   [facility] Gareth Mliss FALCON, FNP   1 year ago Essential hypertension   [facility] Gareth Mliss FALCON, FNP   1 year ago Essential hypertension   [facility] Gareth Mliss FALCON, FNP   1 year ago Essential hypertension   [facility] Gareth Mliss FALCON, FNP       Future Appointments             In 1 week Gareth Mliss FALCON, FNP [facility], [facility]             metoprolol  succinate (TOPROL -XL) 50 MG 24 hr tablet 90 tablet 0    Sig: Take 1 tablet (50 mg total) by mouth daily. Take with or immediately following a meal.     Cardiovascular:  Beta Blockers Passed - 06/15/2023  4:15 PM      Passed - Last BP in normal range    BP Readings from Last 1 Encounters:  03/25/23 128/74         Passed - Last Heart Rate in normal range    Pulse Readings from Last 1 Encounters:  03/25/23 98         Passed - Valid encounter within last 6 months    Recent Outpatient Visits           2 months ago New onset type 2 diabetes mellitus [facility])   [facility] Gareth Mliss FALCON, FNP   8 months ago Essential hypertension   [facility] Gareth Mliss FALCON, FNP    1 year ago Essential hypertension   [facility] Gareth Mliss FALCON, FNP   1 year ago Essential hypertension   [facility] Gareth Mliss FALCON, FNP   1 year ago Essential hypertension   [facility] Gareth Mliss FALCON, FNP       Future Appointments             In 1 week Gareth, Mliss FALCON, FNP [facility], [facility]

## 2023-06-15 NOTE — Telephone Encounter (Signed)
 Requested medication (s) are due for refill today - yes  Requested medication (s) are on the active medication list -yes  Future visit scheduled -yes  Last refill: 03/26/23 #90  Notes to clinic: fails lab protocol- over 1 year- 03/23/22  Requested Prescriptions  Pending Prescriptions Disp Refills   valsartan -hydrochlorothiazide  (DIOVAN -HCT) 160-12.5 MG tablet 90 tablet 0    Sig: Take 1 tablet by mouth daily.     Cardiovascular: ARB + Diuretic Combos Failed - 06/15/2023  4:16 PM      Failed - K in normal range and within 180 days    Potassium  Date Value Ref Range Status  03/23/2022 3.6 3.5 - 5.3 mmol/L Final         Failed - Na in normal range and within 180 days    Sodium  Date Value Ref Range Status  03/23/2022 142 135 - 146 mmol/L Final  02/25/2015 140 136 - 144 mmol/L Final    Comment:                  **Please note reference interval change**         Failed - Cr in normal range and within 180 days    Creat  Date Value Ref Range Status  03/23/2022 0.95 0.50 - 0.97 mg/dL Final   Creatinine, Urine  Date Value Ref Range Status  11/13/2015 <10 mg/dL Final         Failed - eGFR is 10 or above and within 180 days    GFR, Est African American  Date Value Ref Range Status  09/18/2020 85 > OR = 60 mL/min/1.79m2 Final   GFR, Est Non African American  Date Value Ref Range Status  09/18/2020 73 > OR = 60 mL/min/1.6m2 Final   eGFR  Date Value Ref Range Status  03/23/2022 79 > OR = 60 mL/min/1.95m2 Final         Passed - Patient is not pregnant      Passed - Last BP in normal range    BP Readings from Last 1 Encounters:  03/25/23 128/74         Passed - Valid encounter within last 6 months    Recent Outpatient Visits           2 months ago New onset type 2 diabetes [facility])   [place] [facility] Gareth Mliss FALCON, FNP   8 months ago Essential hypertension   [facility] Gareth Mliss FALCON, FNP   1 year ago  Essential hypertension   [facility] Gareth Mliss FALCON, FNP   1 year ago Essential hypertension   [facility] Gareth Mliss FALCON, FNP   1 year ago Essential hypertension   [facility] Gareth Mliss FALCON, FNP       Future Appointments             In 1 week Gareth Mliss FALCON, FNP [facility], [facility]               Requested Prescriptions  Pending Prescriptions Disp Refills   valsartan -hydrochlorothiazide  (DIOVAN -HCT) 160-12.5 MG tablet 90 tablet 0    Sig: Take 1 tablet by mouth daily.     Cardiovascular: ARB + Diuretic Combos Failed - 06/15/2023  4:16 PM      Failed - K in normal range and within 180 days    Potassium  Date Value Ref Range Status  03/23/2022  3.6 3.5 - 5.3 mmol/L Final         Failed - Na in normal range and within 180 days    Sodium  Date Value Ref Range Status  03/23/2022 142 135 - 146 mmol/L Final  02/25/2015 140 136 - 144 mmol/L Final    Comment:                  **Please note reference interval change**         Failed - Cr in normal range and within 180 days    Creat  Date Value Ref Range Status  03/23/2022 0.95 0.50 - 0.97 mg/dL Final   Creatinine, Urine  Date Value Ref Range Status  11/13/2015 <10 mg/dL Final         Failed - eGFR is 10 or above and within 180 days    GFR, Est African American  Date Value Ref Range Status  09/18/2020 85 > OR = 60 mL/min/1.51m2 Final   GFR, Est Non African American  Date Value Ref Range Status  09/18/2020 73 > OR = 60 mL/min/1.65m2 Final   eGFR  Date Value Ref Range Status  03/23/2022 79 > OR = 60 mL/min/1.38m2 Final         Passed - Patient is not pregnant      Passed - Last BP in normal range    BP Readings from Last 1 Encounters:  03/25/23 128/74         Passed - Valid encounter within last 6 months    Recent Outpatient Visits           2 months ago New onset type 2 diabetes mellitus [facility])    [facility] Gareth Mliss FALCON, FNP   8 months ago Essential hypertension   [facility] Gareth Mliss FALCON, FNP   1 year ago Essential hypertension   [facility] Gareth Mliss FALCON, FNP   1 year ago Essential hypertension   [facility] Gareth Mliss FALCON, FNP   1 year ago Essential hypertension   [facility] Gareth Mliss FALCON, FNP       Future Appointments             In 1 week Gareth, Mliss FALCON, FNP [facility], [facility]

## 2023-06-16 ENCOUNTER — Other Ambulatory Visit: Payer: Self-pay

## 2023-06-16 MED FILL — Valsartan-Hydrochlorothiazide Tab 160-12.5 MG: ORAL | 90 days supply | Qty: 90 | Fill #0 | Status: AC

## 2023-06-16 NOTE — Telephone Encounter (Signed)
[facility] called and spoke to [facility], Technician about the requested refills. She verified they were received on yesterday and will all be ready after 11am today.

## 2023-06-25 ENCOUNTER — Encounter: Payer: Self-pay | Admitting: Nurse Practitioner

## 2023-06-25 ENCOUNTER — Ambulatory Visit (INDEPENDENT_AMBULATORY_CARE_PROVIDER_SITE_OTHER): Payer: Commercial Managed Care - PPO | Admitting: Nurse Practitioner

## 2023-06-25 VITALS — BP 124/82 | HR 103 | Resp 18 | Ht 65.0 in | Wt 317.1 lb

## 2023-06-25 DIAGNOSIS — E782 Mixed hyperlipidemia: Secondary | ICD-10-CM | POA: Diagnosis not present

## 2023-06-25 DIAGNOSIS — Z7985 Long-term (current) use of injectable non-insulin antidiabetic drugs: Secondary | ICD-10-CM | POA: Diagnosis not present

## 2023-06-25 DIAGNOSIS — E1165 Type 2 diabetes mellitus with hyperglycemia: Secondary | ICD-10-CM

## 2023-06-25 DIAGNOSIS — Z6841 Body Mass Index (BMI) 40.0 and over, adult: Secondary | ICD-10-CM

## 2023-06-25 DIAGNOSIS — C50411 Malignant neoplasm of upper-outer quadrant of right female breast: Secondary | ICD-10-CM | POA: Diagnosis not present

## 2023-06-25 DIAGNOSIS — E559 Vitamin D deficiency, unspecified: Secondary | ICD-10-CM | POA: Diagnosis not present

## 2023-06-25 DIAGNOSIS — E88819 Insulin resistance, unspecified: Secondary | ICD-10-CM

## 2023-06-25 DIAGNOSIS — Z170 Estrogen receptor positive status [ER+]: Secondary | ICD-10-CM

## 2023-06-25 DIAGNOSIS — I10 Essential (primary) hypertension: Secondary | ICD-10-CM | POA: Diagnosis not present

## 2023-06-25 LAB — POCT GLYCOSYLATED HEMOGLOBIN (HGB A1C): Hemoglobin A1C: 6.5 % — AB (ref 4.0–5.6)

## 2023-06-25 NOTE — Progress Notes (Addendum)
BP 124/82   Pulse (!) 103   Resp 18   Ht 5\' 5"  (1.651 m)   Wt (!) 317 lb 1.6 oz (143.8 kg)   LMP 02/08/2018 (Exact Date)   SpO2 99%   BMI 52.77 kg/m    Subjective:    Patient ID: Mckenzie Lopez, female    DOB: Mckenzie Lopez 28, 1987, 38 y.o.   MRN: [id number]  HPI: Mckenzie Lopez is a 38 y.o. female Pt returns to clinic for 3 month follow up appointment. Newly diagnosed for type 2 DM last visit d/t A1c of 9.8, and initiated treatment with mounjaro 10mg /0.62ml wkly.  Chief Complaint  Patient presents with   Medical Management of Chronic Issues     Diabetes, Type 2:  -Last A1c 9.8 on 03/25/23, rechecked today = 6.5 -Medications: Mounjaro 10mg /0.10ml pen wkly -Patient is compliant with the above medications and reports no side effects. yes -Checking BG at home: yes -Fasting home BG: 85-181, average low 100s -Post-prandial home BG: 140-150s -Highest home BG since last visit: 181 -Lowest home BG since last visit: 85 -Diet: working on well balanced, low fat diet -Exercise: walks daily -Eye exam: pt educated, and will schedule diabetic eye exam with opthalmology  -Foot exam: completed -Microalbumin: lab pending -Statin: none -PNA vaccine: none -Denies symptoms of hypoglycemia, polyuria, polydipsia, numbness extremities, foot ulcers/trauma. Denies symptoms   Hypertension:  -Medications: metoprolol 50mg  daily, valsartan-hydrochlorothiazide 160-12.5mg  daily, amlodipine 5mg  daily -Patient is compliant with above medications and reports no side effects. Yes -Checking BP at home (average): yes -Highest BP at home: 130/80s -Lowest BP at home: 120/70s -Denies any SOB, CP, vision changes, LE edema or symptoms of hypotension -Diet: working on well balanced, low fat diet -Exercise: walks daily  - will hold amlodipine and monitor blood pressure  HLD:  -Medications: currently managing with diet and exercise -Patient is compliant with above medications and reports no side  effects. N/a -Last lipid panel: 09/30/22  Obesity:  Current weight : 317 lb BMI: 52.77  Highest weight: 322 lb Treatment Tried: recently started taking mounjaro Comorbidities: HTN, Type 2 DM, HLD, Obesity       03/25/2023   11:16 AM 09/30/2022   11:15 AM 06/04/2022    1:21 PM  Depression screen PHQ 2/9  Decreased Interest 0 0 0  Down, Depressed, Hopeless 0 0 0  PHQ - 2 Score 0 0 0  Altered sleeping   0  Tired, decreased energy   0  Change in appetite   0  Feeling bad or failure about yourself    0  Trouble concentrating   0  Moving slowly or fidgety/restless   0  Suicidal thoughts   0  PHQ-9 Score   0    Relevant past medical, surgical, family and social history reviewed and updated as indicated. Interim medical history since our last visit reviewed. Allergies and medications reviewed and updated.  Review of Systems Constitutional: Negative for fever or weight change.  Respiratory: Negative for cough and shortness of breath.   Cardiovascular: Negative for chest pain or palpitations.  Gastrointestinal: Negative for abdominal pain, no bowel changes.  Musculoskeletal: Negative for gait problem or joint swelling.  Skin: Negative for rash.  Neurological: Negative for dizziness or headache.  No other specific complaints in a complete review of systems (except as listed in HPI above).  Per HPI unless specifically indicated above     Objective:    BP 124/82   Pulse (!) 103   Resp 18  Ht 5\' 5"  (1.651 m)   Wt (!) 317 lb 1.6 oz (143.8 kg)   LMP 02/08/2018 (Exact Date)   SpO2 99%   BMI 52.77 kg/m   BP Readings from Last 3 Encounters:  06/25/23 124/82  03/25/23 128/74  12/07/22 129/83     Wt Readings from Last 3 Encounters:  06/25/23 (!) 317 lb 1.6 oz (143.8 kg)  03/25/23 (!) 322 lb 6.4 oz (146.2 kg)  12/07/22 300 lb (136.1 kg)    Physical Exam Vitals reviewed.  Constitutional:      Appearance: Normal appearance.  HENT:     Head: Normocephalic.  Cardiovascular:      Rate and Rhythm: Normal rate and regular rhythm.  Pulmonary:     Effort: Pulmonary effort is normal.     Breath sounds: Normal breath sounds.  Musculoskeletal:        General: Normal range of motion.  Skin:    General: Skin is warm and dry.  Neurological:     General: No focal deficit present.     Mental Status: She is alert and oriented to person, place, and time. Mental status is at baseline.  Psychiatric:        Mood and Affect: Mood normal.        Behavior: Behavior normal.        Thought Content: Thought content normal.        Judgment: Judgment normal.    Diabetic Foot Exam - Simple   Simple Foot Form Diabetic Foot exam was performed with the following findings: Yes 06/25/2023 11:59 AM  Visual Inspection No deformities, no ulcerations, no other skin breakdown bilaterally: Yes Sensation Testing Intact to touch and monofilament testing bilaterally: Yes Pulse Check Posterior Tibialis and Dorsalis pulse intact bilaterally: Yes Comments     Results for orders placed or performed in visit on 06/25/23  POCT HgB A1C   Collection Time: 06/25/23 11:39 AM  Result Value Ref Range   Hemoglobin A1C 6.5 (A) 4.0 - 5.6 %   HbA1c POC (<> result, manual entry)     HbA1c, POC (prediabetic range)     HbA1c, POC (controlled diabetic range)     Last CBC Lab Results  Component Value Date   WBC 5.1 03/23/2022   HGB 11.8 03/23/2022   HCT 34.0 (L) 03/23/2022   MCV 82.5 03/23/2022   MCH 28.6 03/23/2022   RDW 14.5 03/23/2022   PLT 494 (H) 03/23/2022   Last metabolic panel Lab Results  Component Value Date   GLUCOSE 79 03/23/2022   NA 142 03/23/2022   K 3.6 03/23/2022   CL 104 03/23/2022   CO2 27 03/23/2022   BUN 13 03/23/2022   CREATININE 0.95 03/23/2022   EGFR 79 03/23/2022   CALCIUM 9.8 03/23/2022   PHOS 3.6 11/15/2015   PROT 7.5 03/23/2022   ALBUMIN 4.3 09/07/2016   LABGLOB 2.9 02/25/2015   AGRATIO 1.4 02/25/2015   BILITOT 0.3 03/23/2022   ALKPHOS 41 09/07/2016    AST 11 03/23/2022   ALT 12 03/23/2022   ANIONGAP 7 11/16/2015   Last lipids Lab Results  Component Value Date   CHOL 215 (H) 09/30/2022   HDL 50 09/30/2022   LDLCALC 143 (H) 09/30/2022   TRIG 106 09/30/2022   CHOLHDL 4.3 09/30/2022   Last hemoglobin A1c Lab Results  Component Value Date   HGBA1C 6.5 (A) 06/25/2023        Assessment & Plan:   Problem List Items Addressed This Visit  Cardiovascular and Mediastinum   Essential hypertension - Primary   BP well managed with current medications. Pt will do a trial run of discontinuing amlodipine 5mg  daily to see if condition can be managed with less medications. Will continue taking Metoprolol succinate 50mg  daily, as well as valsartan-hydrochlorothiazide 160-12.5mg  daily. Will reassess at next 3 month follow up appointment.      Relevant Orders   CBC with Differential/Platelet   COMPLETE METABOLIC PANEL WITH GFR     Endocrine   New onset type 2 diabetes mellitus (HCC)   Currently taking Mounjaro 10mg /0.38ml pen wkly. Previous A1c was 9.8, today's A1c is 6.5. Microalbumin/creatinine ratio lab pending. Will continue current dose of Mounjaro and recheck A1c in 3 months.      RESOLVED: Insulin resistance   Currently taking Mounjaro 10mg /0.15ml pen wkly. Previous A1c was 9.8, today's A1c is 6.5. Microalbumin/creatinine ratio lab pending. Will continue current dose of Mounjaro and recheck A1c in 3 months.        Other   Morbid obesity with BMI of 50.0-59.9, adult (HCC)   Currently taking mounjaro for type 2 DM. Medication maybe helpful with weight loss as well. Will continue recheck weight in 3 months.  Commodities: HTN, type 2 DM, HDL       Hyperlipidemia   Currently managing with diet and exercise. Labs pending, will continue to monitor      Relevant Orders   Lipid panel   Vitamin D deficiency   Currently taking ergocalciferol 1.25mg  wkly for management      Malignant neoplasm of upper-outer quadrant of right  breast in female, estrogen receptor positive (HCC)   History of breast cancer.  Doing well no changes.          Follow up plan: Return in about 3 months (around 09/22/2023) for follow up.

## 2023-06-25 NOTE — Assessment & Plan Note (Signed)
Currently managing with diet and exercise. Labs pending, will continue to monitor

## 2023-06-25 NOTE — Assessment & Plan Note (Signed)
Currently taking Mounjaro 10mg /0.59ml pen wkly. Previous A1c was 9.8, today's A1c is 6.5. Microalbumin/creatinine ratio lab pending. Will continue current dose of Mounjaro and recheck A1c in 3 months.

## 2023-06-25 NOTE — Progress Notes (Signed)
Marland Kitchen

## 2023-06-25 NOTE — Assessment & Plan Note (Signed)
Currently taking ergocalciferol 1.25mg  wkly for management

## 2023-06-25 NOTE — Assessment & Plan Note (Addendum)
BP well managed with current medications. Pt will do a trial run of discontinuing amlodipine 5mg  daily to see if condition can be managed with less medications. Will continue taking Metoprolol succinate 50mg  daily, as well as valsartan-hydrochlorothiazide 160-12.5mg  daily. Will reassess at next 3 month follow up appointment.

## 2023-06-25 NOTE — Assessment & Plan Note (Signed)
History of breast cancer.  Doing well no changes.

## 2023-06-25 NOTE — Assessment & Plan Note (Addendum)
Currently taking mounjaro for type 2 DM. Medication maybe helpful with weight loss as well. Will continue recheck weight in 3 months.  Commodities: HTN, type 2 DM, HDL

## 2023-06-26 LAB — CBC WITH DIFFERENTIAL/PLATELET
Absolute Lymphocytes: 1529 {cells}/uL (ref 850–3900)
Absolute Monocytes: 330 {cells}/uL (ref 200–950)
Basophils Absolute: 62 {cells}/uL (ref 0–200)
Basophils Relative: 1.1 %
Eosinophils Absolute: 162 {cells}/uL (ref 15–500)
Eosinophils Relative: 2.9 %
HCT: 36.5 % (ref 35.0–45.0)
Hemoglobin: 12.2 g/dL (ref 11.7–15.5)
MCH: 27.5 pg (ref 27.0–33.0)
MCHC: 33.4 g/dL (ref 32.0–36.0)
MCV: 82.4 fL (ref 80.0–100.0)
MPV: 9.2 fL (ref 7.5–12.5)
Monocytes Relative: 5.9 %
Neutro Abs: 3517 {cells}/uL (ref 1500–7800)
Neutrophils Relative %: 62.8 %
Platelets: 538 10*3/uL — ABNORMAL HIGH (ref 140–400)
RBC: 4.43 10*6/uL (ref 3.80–5.10)
RDW: 14.7 % (ref 11.0–15.0)
Total Lymphocyte: 27.3 %
WBC: 5.6 10*3/uL (ref 3.8–10.8)

## 2023-06-26 LAB — COMPLETE METABOLIC PANEL WITH GFR
AG Ratio: 1.4 (calc) (ref 1.0–2.5)
ALT: 15 U/L (ref 6–29)
AST: 13 U/L (ref 10–30)
Albumin: 4.5 g/dL (ref 3.6–5.1)
Alkaline phosphatase (APISO): 59 U/L (ref 31–125)
BUN: 12 mg/dL (ref 7–25)
CO2: 28 mmol/L (ref 20–32)
Calcium: 9.7 mg/dL (ref 8.6–10.2)
Chloride: 103 mmol/L (ref 98–110)
Creat: 0.81 mg/dL (ref 0.50–0.97)
Globulin: 3.3 g/dL (ref 1.9–3.7)
Glucose, Bld: 95 mg/dL (ref 65–99)
Potassium: 3.5 mmol/L (ref 3.5–5.3)
Sodium: 142 mmol/L (ref 135–146)
Total Bilirubin: 0.3 mg/dL (ref 0.2–1.2)
Total Protein: 7.8 g/dL (ref 6.1–8.1)
eGFR: 95 mL/min/{1.73_m2} (ref >=60)

## 2023-06-26 LAB — LIPID PANEL
Cholesterol: 206 mg/dL — ABNORMAL HIGH (ref <200)
HDL: 48 mg/dL — ABNORMAL LOW (ref >=50)
LDL Cholesterol (Calc): 133 mg/dL — ABNORMAL HIGH
Non-HDL Cholesterol (Calc): 158 mg/dL — ABNORMAL HIGH (ref <130)
Total CHOL/HDL Ratio: 4.3 (calc) (ref <5.0)
Triglycerides: 130 mg/dL (ref <150)

## 2023-06-26 LAB — MICROALBUMIN / CREATININE URINE RATIO
Creatinine, Urine: 124 mg/dL (ref 20–275)
Microalb Creat Ratio: 6 mg/g{creat} (ref <30)
Microalb, Ur: 0.7 mg/dL

## 2023-06-28 ENCOUNTER — Encounter: Payer: Self-pay | Admitting: Nurse Practitioner

## 2023-07-01 ENCOUNTER — Encounter: Payer: Self-pay | Admitting: Nurse Practitioner

## 2023-07-02 ENCOUNTER — Other Ambulatory Visit: Payer: Self-pay | Admitting: Nurse Practitioner

## 2023-07-02 ENCOUNTER — Other Ambulatory Visit: Payer: Self-pay

## 2023-07-02 DIAGNOSIS — E1165 Type 2 diabetes mellitus with hyperglycemia: Secondary | ICD-10-CM

## 2023-07-02 MED ORDER — TIRZEPATIDE 15 MG/0.5ML ~~LOC~~ SOAJ
15.0000 mg | SUBCUTANEOUS | 5 refills | Status: AC
  Filled 2023-07-02: qty 6, 84d supply, fill #0
  Filled 2023-08-02: qty 2, 28d supply, fill #0
  Filled 2023-08-26 (×2): qty 2, 28d supply, fill #1
  Filled 2023-09-23: qty 2, 28d supply, fill #2
  Filled 2023-10-22 – 2023-10-25 (×2): qty 2, 28d supply, fill #3
  Filled 2023-11-18: qty 2, 28d supply, fill #4
  Filled 2023-12-19: qty 2, 28d supply, fill #5
  Filled 2024-01-17: qty 2, 28d supply, fill #6
  Filled 2024-02-14: qty 2, 28d supply, fill #7
  Filled 2024-03-08: qty 2, 28d supply, fill #8
  Filled 2024-04-07: qty 2, 28d supply, fill #9
  Filled 2024-05-07: qty 2, 28d supply, fill #10
  Filled 2024-05-31: qty 2, 28d supply, fill #11

## 2023-07-02 MED ORDER — TIRZEPATIDE 12.5 MG/0.5ML ~~LOC~~ SOAJ
12.5000 mg | SUBCUTANEOUS | 0 refills | Status: DC
  Filled 2023-07-02: qty 2, 28d supply, fill #0

## 2023-07-05 ENCOUNTER — Other Ambulatory Visit: Payer: Self-pay

## 2023-07-08 ENCOUNTER — Encounter: Payer: Self-pay | Admitting: Nurse Practitioner

## 2023-07-09 ENCOUNTER — Other Ambulatory Visit: Payer: Self-pay | Admitting: Nurse Practitioner

## 2023-07-09 ENCOUNTER — Other Ambulatory Visit: Payer: Self-pay

## 2023-07-09 DIAGNOSIS — J309 Allergic rhinitis, unspecified: Secondary | ICD-10-CM

## 2023-07-09 MED ORDER — FLUTICASONE PROPIONATE 50 MCG/ACT NA SUSP
1.0000 | Freq: Every day | NASAL | 5 refills | Status: DC
  Filled 2023-07-09: qty 16, 30d supply, fill #0
  Filled 2023-08-03 – 2023-08-16 (×2): qty 16, 30d supply, fill #1
  Filled 2023-09-14: qty 16, 30d supply, fill #2
  Filled 2023-10-11: qty 16, 30d supply, fill #3
  Filled 2023-11-11: qty 16, 30d supply, fill #4
  Filled 2023-12-19: qty 16, 30d supply, fill #5

## 2023-07-15 ENCOUNTER — Other Ambulatory Visit: Payer: Self-pay

## 2023-07-15 DIAGNOSIS — L2089 Other atopic dermatitis: Secondary | ICD-10-CM | POA: Diagnosis not present

## 2023-07-15 DIAGNOSIS — L810 Postinflammatory hyperpigmentation: Secondary | ICD-10-CM | POA: Diagnosis not present

## 2023-07-15 MED ORDER — TRIAMCINOLONE ACETONIDE 0.1 % EX CREA
1.0000 | TOPICAL_CREAM | Freq: Two times a day (BID) | CUTANEOUS | 1 refills | Status: AC | PRN
Start: 2023-07-15 — End: ?
  Filled 2023-07-15: qty 80, 30d supply, fill #0

## 2023-07-15 MED ORDER — PIMECROLIMUS 1 % EX CREA
1.0000 | TOPICAL_CREAM | Freq: Every day | CUTANEOUS | 1 refills | Status: AC
Start: 2023-07-15 — End: ?
  Filled 2023-07-15: qty 30, 30d supply, fill #0
  Filled 2023-08-13: qty 30, 30d supply, fill #1

## 2023-07-16 ENCOUNTER — Other Ambulatory Visit: Payer: Self-pay

## 2023-07-19 ENCOUNTER — Other Ambulatory Visit: Payer: Self-pay

## 2023-07-20 ENCOUNTER — Other Ambulatory Visit: Payer: Self-pay

## 2023-07-22 ENCOUNTER — Other Ambulatory Visit: Payer: Self-pay

## 2023-07-22 DIAGNOSIS — C50811 Malignant neoplasm of overlapping sites of right female breast: Secondary | ICD-10-CM | POA: Diagnosis not present

## 2023-07-22 DIAGNOSIS — Z170 Estrogen receptor positive status [ER+]: Secondary | ICD-10-CM | POA: Diagnosis not present

## 2023-07-22 MED ORDER — CYCLOBENZAPRINE HCL 5 MG PO TABS
5.0000 mg | ORAL_TABLET | Freq: Three times a day (TID) | ORAL | 0 refills | Status: AC | PRN
  Filled 2023-07-22: qty 30, 10d supply, fill #0

## 2023-07-23 ENCOUNTER — Other Ambulatory Visit: Payer: Self-pay

## 2023-08-02 ENCOUNTER — Other Ambulatory Visit: Payer: Self-pay

## 2023-08-03 ENCOUNTER — Other Ambulatory Visit: Payer: Self-pay

## 2023-08-03 DIAGNOSIS — Z9011 Acquired absence of right breast and nipple: Secondary | ICD-10-CM | POA: Diagnosis not present

## 2023-08-03 DIAGNOSIS — Z4431 Encounter for fitting and adjustment of external right breast prosthesis: Secondary | ICD-10-CM | POA: Diagnosis not present

## 2023-08-03 DIAGNOSIS — C50811 Malignant neoplasm of overlapping sites of right female breast: Secondary | ICD-10-CM | POA: Diagnosis not present

## 2023-08-04 ENCOUNTER — Other Ambulatory Visit: Payer: Self-pay

## 2023-08-13 ENCOUNTER — Other Ambulatory Visit: Payer: Self-pay

## 2023-08-16 ENCOUNTER — Other Ambulatory Visit: Payer: Self-pay

## 2023-08-16 ENCOUNTER — Other Ambulatory Visit: Payer: Self-pay | Admitting: Nurse Practitioner

## 2023-08-17 ENCOUNTER — Other Ambulatory Visit: Payer: Self-pay

## 2023-08-17 MED FILL — Olopatadine HCl Ophth Soln 0.2% (Base Equivalent): OPHTHALMIC | 34 days supply | Qty: 2.5 | Fill #0 | Status: AC

## 2023-08-17 NOTE — Telephone Encounter (Signed)
 Requested Prescriptions  Pending Prescriptions Disp Refills   Olopatadine HCl 0.2 % SOLN [Pharmacy Med Name: Olopatadine HCl 0.2 % Solution] 2.5 mL 2    Sig: Place 1 drop into both eyes daily.     Ophthalmology:  Quincy Carnes - 08/17/2023 11:17 AM      Passed - Valid encounter within last 12 months    Recent Outpatient Visits           1 month ago Essential hypertension   [facility] Berniece Salines, FNP       Future Appointments             In 1 month Zane Herald, Rudolpho Sevin, FNP [facility], [facility]

## 2023-08-18 ENCOUNTER — Other Ambulatory Visit: Payer: Self-pay

## 2023-08-26 ENCOUNTER — Other Ambulatory Visit: Payer: Self-pay

## 2023-09-14 ENCOUNTER — Other Ambulatory Visit: Payer: Self-pay | Admitting: Nurse Practitioner

## 2023-09-14 ENCOUNTER — Other Ambulatory Visit: Payer: Self-pay

## 2023-09-14 DIAGNOSIS — I10 Essential (primary) hypertension: Secondary | ICD-10-CM

## 2023-09-14 MED ORDER — GABAPENTIN 300 MG PO CAPS
300.0000 mg | ORAL_CAPSULE | Freq: Every day | ORAL | 3 refills | Status: AC
  Filled 2023-09-14: qty 90, 90d supply, fill #0
  Filled 2023-12-09: qty 90, 90d supply, fill #1
  Filled 2024-03-06: qty 90, 90d supply, fill #2
  Filled 2024-06-14: qty 90, 90d supply, fill #3

## 2023-09-14 MED FILL — Valsartan-Hydrochlorothiazide Tab 160-12.5 MG: ORAL | 90 days supply | Qty: 90 | Fill #1 | Status: AC

## 2023-09-15 ENCOUNTER — Other Ambulatory Visit: Payer: Self-pay

## 2023-09-15 ENCOUNTER — Encounter: Payer: Self-pay | Admitting: Nurse Practitioner

## 2023-09-15 MED FILL — Amlodipine Besylate Tab 5 MG (Base Equivalent): ORAL | 30 days supply | Qty: 30 | Fill #0 | Status: AC

## 2023-09-15 MED FILL — Metoprolol Succinate Tab ER 24HR 50 MG (Tartrate Equiv): ORAL | 30 days supply | Qty: 30 | Fill #0 | Status: AC

## 2023-09-22 ENCOUNTER — Ambulatory Visit: Payer: Commercial Managed Care - PPO | Admitting: Nurse Practitioner

## 2023-09-22 ENCOUNTER — Encounter: Payer: Self-pay | Admitting: Nurse Practitioner

## 2023-09-22 ENCOUNTER — Other Ambulatory Visit: Payer: Self-pay

## 2023-09-22 VITALS — BP 116/82 | HR 102 | Temp 98.6°F | Ht 65.0 in | Wt 304.9 lb

## 2023-09-22 DIAGNOSIS — I10 Essential (primary) hypertension: Secondary | ICD-10-CM

## 2023-09-22 DIAGNOSIS — E782 Mixed hyperlipidemia: Secondary | ICD-10-CM

## 2023-09-22 DIAGNOSIS — Z6841 Body Mass Index (BMI) 40.0 and over, adult: Secondary | ICD-10-CM

## 2023-09-22 DIAGNOSIS — Z170 Estrogen receptor positive status [ER+]: Secondary | ICD-10-CM

## 2023-09-22 DIAGNOSIS — E1165 Type 2 diabetes mellitus with hyperglycemia: Secondary | ICD-10-CM

## 2023-09-22 DIAGNOSIS — C50411 Malignant neoplasm of upper-outer quadrant of right female breast: Secondary | ICD-10-CM

## 2023-09-22 LAB — POCT GLYCOSYLATED HEMOGLOBIN (HGB A1C): Hemoglobin A1C: 5.9 % — AB (ref 4.0–5.6)

## 2023-09-22 MED ORDER — VALSARTAN-HYDROCHLOROTHIAZIDE 160-12.5 MG PO TABS
1.0000 | ORAL_TABLET | Freq: Every day | ORAL | 1 refills | Status: DC
  Filled 2023-09-22 – 2023-12-13 (×3): qty 90, 90d supply, fill #0
  Filled 2024-03-08: qty 90, 90d supply, fill #1

## 2023-09-22 MED ORDER — AMLODIPINE BESYLATE 5 MG PO TABS
5.0000 mg | ORAL_TABLET | Freq: Every day | ORAL | 1 refills | Status: DC
  Filled 2023-09-22 – 2023-10-10 (×2): qty 90, 90d supply, fill #0
  Filled 2024-01-08: qty 90, 90d supply, fill #1

## 2023-09-22 MED ORDER — METOPROLOL SUCCINATE ER 50 MG PO TB24
50.0000 mg | ORAL_TABLET | Freq: Every day | ORAL | 1 refills | Status: DC
  Filled 2023-09-22 – 2023-10-10 (×2): qty 90, 90d supply, fill #0
  Filled 2024-01-08: qty 90, 90d supply, fill #1

## 2023-09-22 NOTE — Progress Notes (Signed)
 BP 116/82   Pulse (!) 102   Temp 98.6 F (37 C) (Oral)   Ht 5\' 5"  (1.651 m)   Wt (!) 304 lb 14.4 oz (138.3 kg)   LMP 02/08/2018 (Exact Date)   SpO2 98%   BMI 50.74 kg/m    Subjective:    Patient ID: Mckenzie Lopez, female    DOB: 12/11/1985, 38 y.o.   MRN: [id number]  HPI: Mckenzie Lopez is a 38 y.o. female  Chief Complaint  Patient presents with   Medical Management of Chronic Issues    Discussed the use of AI scribe software for clinical note transcription with the patient, who gave verbal consent to proceed.  History of Present Illness Mckenzie Lopez is a 38 year old female who presents for routine follow-up.  She has a history of hypertension, which is currently managed with amlodipine  5 mg daily, metoprolol  50 mg daily, and valsartan -hydrochlorothiazide  160/12.5 mg daily. Her blood pressure is well-controlled on this regimen.  She has a history of breast cancer and is on Arimidex  1 mg daily as part of her treatment regimen.She takes duloxetine  90 mg daily and gabapentin  300 mg at bedtime from her oncologist.   Her type two diabetes is managed with Mounjaro  15 mg daily. Her last A1c was 6.5, and a point-of-care A1c today showed an improvement to 5.9.  She is also being treated for hyperlipidemia, with her last LDL recorded at 133.  She feels good overall with no new symptoms or issues.         09/22/2023   11:14 AM 03/25/2023   11:16 AM 09/30/2022   11:15 AM  Depression screen PHQ 2/9  Decreased Interest 0 0 0  Down, Depressed, Hopeless 0 0 0  PHQ - 2 Score 0 0 0  Altered sleeping 0    Tired, decreased energy 0    Change in appetite 0    Feeling bad or failure about yourself  0    Trouble concentrating 0    Moving slowly or fidgety/restless 0    Suicidal thoughts 0    PHQ-9 Score 0    Difficult doing work/chores Not difficult at all      Relevant past medical, surgical, family and social history reviewed and updated as indicated.  Interim medical history since our last visit reviewed. Allergies and medications reviewed and updated.  Review of Systems  Constitutional: Negative for fever or weight change.  Respiratory: Negative for cough and shortness of breath.   Cardiovascular: Negative for chest pain or palpitations.  Gastrointestinal: Negative for abdominal pain, no bowel changes.  Musculoskeletal: Negative for gait problem or joint swelling.  Skin: Negative for rash.  Neurological: Negative for dizziness or headache.  No other specific complaints in a complete review of systems (except as listed in HPI above).      Objective:      BP 116/82   Pulse (!) 102   Temp 98.6 F (37 C) (Oral)   Ht 5\' 5"  (1.651 m)   Wt (!) 304 lb 14.4 oz (138.3 kg)   LMP 02/08/2018 (Exact Date)   SpO2 98%   BMI 50.74 kg/m    Wt Readings from Last 3 Encounters:  09/22/23 (!) 304 lb 14.4 oz (138.3 kg)  06/25/23 (!) 317 lb 1.6 oz (143.8 kg)  03/25/23 (!) 322 lb 6.4 oz (146.2 kg)    Physical Exam Vitals reviewed.  Constitutional:      Appearance: Normal appearance.  HENT:  Head: Normocephalic.  Cardiovascular:     Rate and Rhythm: Normal rate and regular rhythm.  Pulmonary:     Effort: Pulmonary effort is normal.     Breath sounds: Normal breath sounds.  Musculoskeletal:        General: Normal range of motion.  Skin:    General: Skin is warm and dry.  Neurological:     General: No focal deficit present.     Mental Status: She is alert and oriented to person, place, and time. Mental status is at baseline.  Psychiatric:        Mood and Affect: Mood normal.        Behavior: Behavior normal.        Thought Content: Thought content normal.        Judgment: Judgment normal.    Physical Exam    Results for orders placed or performed in visit on 09/22/23  POCT HgB A1C   Collection Time: 09/22/23 11:20 AM  Result Value Ref Range   Hemoglobin A1C 5.9 (A) 4.0 - 5.6 %   HbA1c POC (<> result, manual entry)      HbA1c, POC (prediabetic range)     HbA1c, POC (controlled diabetic range)            Assessment & Plan:   Problem List Items Addressed This Visit       Cardiovascular and Mediastinum   Essential hypertension - Primary   Relevant Medications   amLODipine  (NORVASC ) 5 MG tablet   metoprolol  succinate (TOPROL -XL) 50 MG 24 hr tablet   valsartan -hydrochlorothiazide  (DIOVAN -HCT) 160-12.5 MG tablet     Endocrine   New onset type 2 diabetes mellitus (HCC)   Relevant Medications   valsartan -hydrochlorothiazide  (DIOVAN -HCT) 160-12.5 MG tablet     Other   Morbid obesity with BMI of 50.0-59.9, adult (HCC)   Hyperlipidemia   Relevant Medications   amLODipine  (NORVASC ) 5 MG tablet   metoprolol  succinate (TOPROL -XL) 50 MG 24 hr tablet   valsartan -hydrochlorothiazide  (DIOVAN -HCT) 160-12.5 MG tablet   Malignant neoplasm of upper-outer quadrant of right breast in female, estrogen receptor positive (HCC)     Assessment and Plan Assessment & Plan Type 2 diabetes mellitus Type 2 diabetes mellitus is well-controlled with a recent A1c of 5.9, indicating good glycemic control. Mounjaro  15 mg daily is effective in managing her diabetes. - Continue Mounjaro  15 mg daily - Perform point-of-care A1c test today  Obesity Obesity is being managed with Mounjaro , which also aids in diabetes control. Continued weight management is essential for overall health improvement. - Continue Mounjaro  15 mg daily  Hypertension Hypertension is well-controlled with a current blood pressure of 116/82 mmHg. Amlodipine , metoprolol , and valsartan -hydrochlorothiazide  effectively manage her blood pressure. - Continue amlodipine  5 mg daily - Continue metoprolol  50 mg daily - Continue valsartan -hydrochlorothiazide  160/12.5 mg daily  Hyperlipidemia Hyperlipidemia management is ongoing. The last LDL was 133 mg/dL. Current medication regimen includes Arimidex , which may impact lipid levels.  Breast cancer Breast cancer  is being managed with Arimidex  1 mg daily. No new concerns or symptoms reported. - Continue Arimidex  1 mg daily        Follow up plan: Return in about 4 months (around 01/23/2024) for follow up.

## 2023-09-23 ENCOUNTER — Other Ambulatory Visit: Payer: Self-pay

## 2023-09-23 DIAGNOSIS — Z9011 Acquired absence of right breast and nipple: Secondary | ICD-10-CM | POA: Diagnosis not present

## 2023-09-23 DIAGNOSIS — T451X5A Adverse effect of antineoplastic and immunosuppressive drugs, initial encounter: Secondary | ICD-10-CM | POA: Diagnosis not present

## 2023-09-23 DIAGNOSIS — Z79811 Long term (current) use of aromatase inhibitors: Secondary | ICD-10-CM | POA: Diagnosis not present

## 2023-09-23 DIAGNOSIS — Z170 Estrogen receptor positive status [ER+]: Secondary | ICD-10-CM | POA: Diagnosis not present

## 2023-09-23 DIAGNOSIS — I890 Lymphedema, not elsewhere classified: Secondary | ICD-10-CM | POA: Diagnosis not present

## 2023-09-23 DIAGNOSIS — G620 Drug-induced polyneuropathy: Secondary | ICD-10-CM | POA: Diagnosis not present

## 2023-09-23 DIAGNOSIS — C50811 Malignant neoplasm of overlapping sites of right female breast: Secondary | ICD-10-CM | POA: Diagnosis not present

## 2023-09-23 DIAGNOSIS — M8580 Other specified disorders of bone density and structure, unspecified site: Secondary | ICD-10-CM | POA: Diagnosis not present

## 2023-09-23 MED ORDER — ANASTROZOLE 1 MG PO TABS
1.0000 mg | ORAL_TABLET | Freq: Every day | ORAL | 3 refills | Status: AC
  Filled 2023-09-23 – 2023-12-19 (×2): qty 90, 90d supply, fill #0
  Filled 2024-03-19: qty 90, 90d supply, fill #1
  Filled 2024-06-14: qty 90, 90d supply, fill #2

## 2023-09-24 ENCOUNTER — Other Ambulatory Visit: Payer: Self-pay

## 2023-09-24 MED FILL — Olopatadine HCl Ophth Soln 0.2% (Base Equivalent): OPHTHALMIC | 34 days supply | Qty: 2.5 | Fill #1 | Status: AC

## 2023-10-11 ENCOUNTER — Other Ambulatory Visit: Payer: Self-pay

## 2023-10-23 ENCOUNTER — Other Ambulatory Visit: Payer: Self-pay

## 2023-10-25 ENCOUNTER — Other Ambulatory Visit: Payer: Self-pay

## 2023-10-28 MED FILL — Olopatadine HCl Ophth Soln 0.2% (Base Equivalent): OPHTHALMIC | 34 days supply | Qty: 2.5 | Fill #2 | Status: AC

## 2023-11-10 DIAGNOSIS — C50811 Malignant neoplasm of overlapping sites of right female breast: Secondary | ICD-10-CM | POA: Diagnosis not present

## 2023-11-10 DIAGNOSIS — Z170 Estrogen receptor positive status [ER+]: Secondary | ICD-10-CM | POA: Diagnosis not present

## 2023-11-10 DIAGNOSIS — Z79811 Long term (current) use of aromatase inhibitors: Secondary | ICD-10-CM | POA: Diagnosis not present

## 2023-11-10 DIAGNOSIS — Z9011 Acquired absence of right breast and nipple: Secondary | ICD-10-CM | POA: Diagnosis not present

## 2023-11-10 DIAGNOSIS — I890 Lymphedema, not elsewhere classified: Secondary | ICD-10-CM | POA: Diagnosis not present

## 2023-11-11 ENCOUNTER — Other Ambulatory Visit: Payer: Self-pay

## 2023-11-18 ENCOUNTER — Other Ambulatory Visit: Payer: Self-pay

## 2023-11-22 ENCOUNTER — Other Ambulatory Visit: Payer: Self-pay

## 2023-12-07 DIAGNOSIS — Z4431 Encounter for fitting and adjustment of external right breast prosthesis: Secondary | ICD-10-CM | POA: Diagnosis not present

## 2023-12-07 DIAGNOSIS — Z9011 Acquired absence of right breast and nipple: Secondary | ICD-10-CM | POA: Diagnosis not present

## 2023-12-07 DIAGNOSIS — C50811 Malignant neoplasm of overlapping sites of right female breast: Secondary | ICD-10-CM | POA: Diagnosis not present

## 2023-12-08 NOTE — Progress Notes (Deleted)
 Chief Complaint  Patient presents with   Gynecologic Exam    No concerns     HPI:      [name] is a 38 y.o. G1P1001 who LMP was Patient's last menstrual period was 02/08/2018 (exact date)., presents today for her annual examination. Her menses are absent since 10/19 chemo start and s/p lap hyst BSO 7/20.  Has tolerable vasomotor sx with arimidex , already on gabapentin . No PMB.   Sex activity: not sexually active; no vag sx. Last Pap: 12/04/21  Results were: no abnormalities /neg HPV DNA; s/p lap hyst BSO 7/20 Hx of STDs: chlamydia, CIN 1 in 2014   Last mammo: 01/14/23 LT side at [facility]; normal; repeat due in 12 months. Mammo 11/26/17 was Birads-5. Bx with Dr. Dessa showed RT intraductal carcinoma with mets to axillary lymph nodes. Did RT mastectomy and chemo at [facility], now on arimidex  for 10 yrs (this is year 7 for her); (changed from femara  due to knee pain). 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 Dessa; Vistaseq panel testing done 6/20 was negative.   There is a FH of breast cancer in her MGM and mat aunt, genetic testing not done by them. Pt is BRCA neg/Vistaseq panel neg 6/20. There is no FH of ovarian cancer. The patient does do self-breast exams.    Tobacco use: The patient denies current or previous tobacco use. Alcohol use: none No drug use Exercise: moderately active   She does get adequate calcium and Vitamin D  in her diet    She has her labs managed with PCP.  DEXA: 2021, 2022, 2024 at [facility]. Osteopenia in spine, normal hip.    Past Medical History:  Diagnosis Date   BRCA negative 2019   BRCA with Dr. Dessa, 7/20 Vistaseq panel neg with Bernarda Schroeder, PA-C   CHF (congestive heart failure) (HCC)    POSTPARTUM 2017/ RESOLVED   Eczema    Hemoglobin C trait (HCC) 11/29/2015   Hyperlipidemia    Hypertension    IFG (impaired fasting glucose)    Intraductal carcinoma of right breast 11/2017   ER/PR+; Her2neu neg; mets to LN   Low back pain    Migraines     Morbid obesity (HCC)    Premature surgical menopause 11/2018    Past Surgical History:  Procedure Laterality Date   ABDOMINAL HYSTERECTOMY  11/28/2018   Full   AXILLARY LYMPH NODE BIOPSY Right 12/01/2017   METASTATIC MAMMARY CARCINOMA   AXILLARY LYMPH NODE BIOPSY Left 12/01/2017   NEGATIVE FOR MALIGNANCY   BREAST BIOPSY Right 12/01/2017   11 and 12 o'clock, INVASIVE MAMMARY CARCINOMA, ER/PR positive HER2 negative   MASTECTOMY Right 01/05/2018    Family History  Problem Relation Age of Onset   Diabetes Mother    Hypertension Mother    Thyroid  disease Mother    Breast cancer Maternal Grandmother        ?age   Diabetes Maternal Grandfather    Hypertension Maternal Grandfather    Seizures Maternal Grandfather    Breast cancer Maternal Aunt        early 21's   Diabetes Sister    Diabetes Paternal Grandmother    Heart disease Neg Hx    Stroke Neg Hx    COPD Neg Hx    Ovarian cancer Neg Hx     Social History   Socioeconomic History   Marital status: Single    Spouse name: Not on file   Number of children: Not on  file   Years of education: Not on file   Highest education level: 12th grade  Occupational History   Not on file  Tobacco Use   Smoking status: Never   Smokeless tobacco: Never  Vaping Use   Vaping status: Never Used  Substance and Sexual Activity   Alcohol use: No   Drug use: No   Sexual activity: Not Currently    Birth control/protection: None, Surgical    Comment: Hysterectomy  Other Topics Concern   Not on file  Social History Narrative   Not on file   Social Determinants of Health   Financial Resource Strain: Medium Risk (09/26/2022)   Overall Financial Resource Strain (CARDIA)    Difficulty of Paying Living Expenses: Somewhat hard  Food Insecurity: Food Insecurity Present (09/26/2022)   Hunger Vital Sign    Worried About Running Out of Food in the Last Year: Sometimes true    Ran Out of Food in the Last Year: Patient declined   Transportation Needs: No Transportation Needs (09/26/2022)   PRAPARE - Administrator, Civil Service (Medical): No    Lack of Transportation (Non-Medical): No  Physical Activity: Insufficiently Active (09/26/2022)   Exercise Vital Sign    Days of Exercise per Week: 2 days    Minutes of Exercise per Session: 30 min  Stress: No Stress Concern Present (09/26/2022)   [name] of Occupational Health - Occupational Stress Questionnaire    Feeling of Stress : Not at all  Social Connections: Unknown (09/26/2022)   Social Connection and Isolation Panel [NHANES]    Frequency of Communication with Friends and Family: More than three times a week    Frequency of Social Gatherings with Friends and Family: Once a week    Attends Religious Services: More than 4 times per year    Active Member of [employer] or Organizations: Yes    Attends Banker Meetings: 1 to 4 times per year    Marital Status: Not on file  Intimate Partner Violence: Not on file    Current Outpatient Medications on File Prior to Visit  Medication Sig Dispense Refill   amLODipine  (NORVASC ) 5 MG tablet Take 1 tablet (5 mg total) by mouth daily. 90 tablet 3   anastrozole  (ARIMIDEX ) 1 MG tablet Take by mouth.     DULoxetine  (CYMBALTA ) 30 MG capsule Take 3 capsules (90 mg total) by mouth once daily 270 capsule 3   fluticasone  (FLONASE ) 50 MCG/ACT nasal spray SHAKE LIQUID AND USE 2 SPRAYS IN EACH NOSTRIL DAILY AS NEEDED FOR ALLERGIES 48 g 0   gabapentin  (NEURONTIN ) 300 MG capsule Take 1 capsule (300 mg total) by mouth at bedtime. 90 capsule 3   loratadine (CLARITIN) 10 MG tablet Take 10 mg by mouth daily.     metFORMIN  (GLUCOPHAGE ) 500 MG tablet Take 1 tablet (500 mg total) by mouth 2 (two) times daily with a meal. 180 tablet 1   metoprolol  succinate (TOPROL  XL) 50 MG 24 hr tablet Take 1 tablet (50 mg total) by mouth daily. Take with or immediately following a meal. 90 tablet 3   Olopatadine  HCl 0.2 % SOLN  Place 1 drop into both eyes daily. 2.5 mL 11   valsartan -hydrochlorothiazide  (DIOVAN  HCT) 160-12.5 MG tablet Take 1 tablet by mouth daily. 90 tablet 3   No current facility-administered medications on file prior to visit.    ROS:  Review of Systems  Constitutional:  Negative for fatigue, fever and unexpected weight change.  Respiratory:  Negative for cough, shortness of breath and wheezing.   Cardiovascular:  Negative for chest pain, palpitations and leg swelling.  Gastrointestinal:  Negative for blood in stool, constipation, diarrhea, nausea and vomiting.  Endocrine: Negative for cold intolerance, heat intolerance and polyuria.  Genitourinary:  Negative for dyspareunia, dysuria, flank pain, frequency, genital sores, hematuria, menstrual problem, pelvic pain, urgency, vaginal bleeding, vaginal discharge and vaginal pain.  Musculoskeletal:  Negative for back pain, joint swelling and myalgias.  Skin:  Negative for rash.  Neurological:  Negative for dizziness, syncope, light-headedness, numbness and headaches.  Hematological:  Negative for adenopathy.  Psychiatric/Behavioral:  Negative for agitation, confusion, sleep disturbance and suicidal ideas. The patient is not nervous/anxious.      Objective: BP 129/83   Pulse 93   Ht 5' 5 (1.651 m)   Wt 300 lb (136.1 kg)   LMP 02/08/2018 (Exact Date)   BMI 49.92 kg/m    Physical Exam Constitutional:      Appearance: She is well-developed.  Genitourinary:     Vulva normal.     Genitourinary Comments: UTERUS/CX SURG REM     Right Labia: No rash, tenderness or lesions.    Left Labia: No tenderness, lesions or rash.    Vaginal cuff intact.    No vaginal discharge, erythema or tenderness.      Right Adnexa: not tender and no mass present.    Left Adnexa: not tender and no mass present.    Cervix is absent.     No cervical motion tenderness or polyp.     Uterus is not enlarged or tender.     Uterus is absent.  Breasts:    Right:  Absent. No mass, skin change or tenderness.     Left: No mass, nipple discharge, skin change or tenderness.  Neck:     Thyroid : No thyromegaly.  Cardiovascular:     Rate and Rhythm: Normal rate and regular rhythm.     Heart sounds: Normal heart sounds. No murmur heard. Pulmonary:     Effort: Pulmonary effort is normal.     Breath sounds: Normal breath sounds.  Chest:     Comments: RT BREAST ABSENT Abdominal:     Palpations: Abdomen is soft.     Tenderness: There is no abdominal tenderness. There is no guarding.  Musculoskeletal:        General: Normal range of motion.     Cervical back: Normal range of motion.  Neurological:     General: No focal deficit present.     Mental Status: She is alert and oriented to person, place, and time.     Cranial Nerves: No cranial nerve deficit.  Skin:    General: Skin is warm and dry.  Psychiatric:        Mood and Affect: Mood normal.        Behavior: Behavior normal.        Thought Content: Thought content normal.        Judgment: Judgment normal.  Vitals reviewed.     Assessment/Plan:  Encounter for annual routine gynecological examination  Encounter for screening mammogram for malignant neoplasm of breast; pt does mammos through [facility].   Malignant neoplasm of upper-outer quadrant of right breast in female, estrogen receptor positive (HCC)--managed at [facility]. Doing arimidex  for 10 yrs. Doing well.   Premature surgical menopause--cont ca/Vit D/exercise. Current on DEXA. Can't have hormones.   GYN counsel breast self exam, mammography screening, adequate intake of calcium and vitamin D , diet and exercise  No follow-ups on file.   Katrece Roediger B. Revanth Neidig, PA-C 12/08/2023 9:47 AM

## 2023-12-09 ENCOUNTER — Ambulatory Visit: Admitting: Obstetrics and Gynecology

## 2023-12-09 ENCOUNTER — Other Ambulatory Visit: Payer: Self-pay

## 2023-12-09 DIAGNOSIS — Z01419 Encounter for gynecological examination (general) (routine) without abnormal findings: Secondary | ICD-10-CM

## 2023-12-09 DIAGNOSIS — Z170 Estrogen receptor positive status [ER+]: Secondary | ICD-10-CM

## 2023-12-09 DIAGNOSIS — Z1231 Encounter for screening mammogram for malignant neoplasm of breast: Secondary | ICD-10-CM

## 2023-12-13 ENCOUNTER — Other Ambulatory Visit: Payer: Self-pay

## 2023-12-20 ENCOUNTER — Other Ambulatory Visit: Payer: Self-pay

## 2023-12-21 ENCOUNTER — Other Ambulatory Visit: Payer: Self-pay

## 2023-12-22 ENCOUNTER — Other Ambulatory Visit: Payer: Self-pay

## 2024-01-05 NOTE — Progress Notes (Unsigned)
 Chief Complaint  Patient presents with   Gynecologic Exam    No concerns     HPI:      [name] is a 38 y.o. G1P1001 who LMP was Patient's last menstrual period was 02/08/2018 (exact date)., presents today for her annual examination. Her menses are absent since 10/19 chemo start and s/p lap hyst BSO 7/20.  Has tolerable vasomotor sx with arimidex , already on gabapentin . No PMB.   Sex activity: not sexually active; no vag sx. Last Pap: 12/04/21  Results were: no abnormalities /neg HPV DNA; s/p lap hyst BSO 7/20 Hx of STDs: chlamydia, CIN 1 in 2014   Last mammo: 01/14/23 LT side at [facility]; normal; repeat due in 12 months. Mammo 11/26/17 was Birads-5. Bx with Dr. Dessa showed RT intraductal carcinoma with mets to axillary lymph nodes. Did RT mastectomy and chemo at [facility], now on arimidex  for 10 yrs (this is year 7 for her); (changed from femara  due to knee pain). 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 Dessa; Vistaseq panel testing done 6/20 was negative.   There is a FH of breast cancer in her MGM and mat aunt, genetic testing not done by them. Pt is BRCA neg/Vistaseq panel neg 6/20. There is no FH of ovarian cancer. The patient does do self-breast exams.    Tobacco use: The patient denies current or previous tobacco use. Alcohol use: none No drug use Exercise: moderately active   She does get adequate calcium and Vitamin D  in her diet    She has her labs managed with PCP.  DEXA: 2021, 2022, 2024 at [facility]. Osteopenia in spine, normal hip.    Past Medical History:  Diagnosis Date   BRCA negative 2019   BRCA with Dr. Dessa, 7/20 Vistaseq panel neg with Bernarda Schroeder, PA-C   CHF (congestive heart failure) (HCC)    POSTPARTUM 2017/ RESOLVED   Eczema    Hemoglobin C trait (HCC) 11/29/2015   Hyperlipidemia    Hypertension    IFG (impaired fasting glucose)    Intraductal carcinoma of right breast 11/2017   ER/PR+; Her2neu neg; mets to LN   Low back pain    Migraines     Morbid obesity (HCC)    Premature surgical menopause 11/2018    Past Surgical History:  Procedure Laterality Date   ABDOMINAL HYSTERECTOMY  11/28/2018   Full   AXILLARY LYMPH NODE BIOPSY Right 12/01/2017   METASTATIC MAMMARY CARCINOMA   AXILLARY LYMPH NODE BIOPSY Left 12/01/2017   NEGATIVE FOR MALIGNANCY   BREAST BIOPSY Right 12/01/2017   11 and 12 o'clock, INVASIVE MAMMARY CARCINOMA, ER/PR positive HER2 negative   MASTECTOMY Right 01/05/2018    Family History  Problem Relation Age of Onset   Diabetes Mother    Hypertension Mother    Thyroid  disease Mother    Breast cancer Maternal Grandmother        ?age   Diabetes Maternal Grandfather    Hypertension Maternal Grandfather    Seizures Maternal Grandfather    Breast cancer Maternal Aunt        early 84's   Diabetes Sister    Diabetes Paternal Grandmother    Heart disease Neg Hx    Stroke Neg Hx    COPD Neg Hx    Ovarian cancer Neg Hx     Social History   Socioeconomic History   Marital status: Single    Spouse name: Not on file   Number of children: Not on  file   Years of education: Not on file   Highest education level: 12th grade  Occupational History   Not on file  Tobacco Use   Smoking status: Never   Smokeless tobacco: Never  Vaping Use   Vaping status: Never Used  Substance and Sexual Activity   Alcohol use: No   Drug use: No   Sexual activity: Not Currently    Birth control/protection: None, Surgical    Comment: Hysterectomy  Other Topics Concern   Not on file  Social History Narrative   Not on file   Social Determinants of Health   Financial Resource Strain: Medium Risk (09/26/2022)   Overall Financial Resource Strain (CARDIA)    Difficulty of Paying Living Expenses: Somewhat hard  Food Insecurity: Food Insecurity Present (09/26/2022)   Hunger Vital Sign    Worried About Running Out of Food in the Last Year: Sometimes true    Ran Out of Food in the Last Year: Patient declined   Transportation Needs: No Transportation Needs (09/26/2022)   PRAPARE - Administrator, Civil Service (Medical): No    Lack of Transportation (Non-Medical): No  Physical Activity: Insufficiently Active (09/26/2022)   Exercise Vital Sign    Days of Exercise per Week: 2 days    Minutes of Exercise per Session: 30 min  Stress: No Stress Concern Present (09/26/2022)   [name] of Occupational Health - Occupational Stress Questionnaire    Feeling of Stress : Not at all  Social Connections: Unknown (09/26/2022)   Social Connection and Isolation Panel [NHANES]    Frequency of Communication with Friends and Family: More than three times a week    Frequency of Social Gatherings with Friends and Family: Once a week    Attends Religious Services: More than 4 times per year    Active Member of [employer] or Organizations: Yes    Attends Banker Meetings: 1 to 4 times per year    Marital Status: Not on file  Intimate Partner Violence: Not on file    Current Outpatient Medications on File Prior to Visit  Medication Sig Dispense Refill   amLODipine  (NORVASC ) 5 MG tablet Take 1 tablet (5 mg total) by mouth daily. 90 tablet 3   anastrozole  (ARIMIDEX ) 1 MG tablet Take by mouth.     DULoxetine  (CYMBALTA ) 30 MG capsule Take 3 capsules (90 mg total) by mouth once daily 270 capsule 3   fluticasone  (FLONASE ) 50 MCG/ACT nasal spray SHAKE LIQUID AND USE 2 SPRAYS IN EACH NOSTRIL DAILY AS NEEDED FOR ALLERGIES 48 g 0   gabapentin  (NEURONTIN ) 300 MG capsule Take 1 capsule (300 mg total) by mouth at bedtime. 90 capsule 3   loratadine (CLARITIN) 10 MG tablet Take 10 mg by mouth daily.     metFORMIN  (GLUCOPHAGE ) 500 MG tablet Take 1 tablet (500 mg total) by mouth 2 (two) times daily with a meal. 180 tablet 1   metoprolol  succinate (TOPROL  XL) 50 MG 24 hr tablet Take 1 tablet (50 mg total) by mouth daily. Take with or immediately following a meal. 90 tablet 3   Olopatadine  HCl 0.2 % SOLN  Place 1 drop into both eyes daily. 2.5 mL 11   valsartan -hydrochlorothiazide  (DIOVAN  HCT) 160-12.5 MG tablet Take 1 tablet by mouth daily. 90 tablet 3   No current facility-administered medications on file prior to visit.    ROS:  Review of Systems  Constitutional:  Negative for fatigue, fever and unexpected weight change.  Respiratory:  Negative for cough, shortness of breath and wheezing.   Cardiovascular:  Negative for chest pain, palpitations and leg swelling.  Gastrointestinal:  Negative for blood in stool, constipation, diarrhea, nausea and vomiting.  Endocrine: Negative for cold intolerance, heat intolerance and polyuria.  Genitourinary:  Negative for dyspareunia, dysuria, flank pain, frequency, genital sores, hematuria, menstrual problem, pelvic pain, urgency, vaginal bleeding, vaginal discharge and vaginal pain.  Musculoskeletal:  Negative for back pain, joint swelling and myalgias.  Skin:  Negative for rash.  Neurological:  Negative for dizziness, syncope, light-headedness, numbness and headaches.  Hematological:  Negative for adenopathy.  Psychiatric/Behavioral:  Negative for agitation, confusion, sleep disturbance and suicidal ideas. The patient is not nervous/anxious.      Objective: BP 129/83   Pulse 93   Ht 5' 5 (1.651 m)   Wt 300 lb (136.1 kg)   LMP 02/08/2018 (Exact Date)   BMI 49.92 kg/m    Physical Exam Constitutional:      Appearance: She is well-developed.  Genitourinary:     Vulva normal.     Genitourinary Comments: UTERUS/CX SURG REM     Right Labia: No rash, tenderness or lesions.    Left Labia: No tenderness, lesions or rash.    Vaginal cuff intact.    No vaginal discharge, erythema or tenderness.      Right Adnexa: not tender and no mass present.    Left Adnexa: not tender and no mass present.    Cervix is absent.     No cervical motion tenderness or polyp.     Uterus is not enlarged or tender.     Uterus is absent.  Breasts:    Right:  Absent. No mass, skin change or tenderness.     Left: No mass, nipple discharge, skin change or tenderness.  Neck:     Thyroid : No thyromegaly.  Cardiovascular:     Rate and Rhythm: Normal rate and regular rhythm.     Heart sounds: Normal heart sounds. No murmur heard. Pulmonary:     Effort: Pulmonary effort is normal.     Breath sounds: Normal breath sounds.  Chest:     Comments: RT BREAST ABSENT Abdominal:     Palpations: Abdomen is soft.     Tenderness: There is no abdominal tenderness. There is no guarding.  Musculoskeletal:        General: Normal range of motion.     Cervical back: Normal range of motion.  Neurological:     General: No focal deficit present.     Mental Status: She is alert and oriented to person, place, and time.     Cranial Nerves: No cranial nerve deficit.  Skin:    General: Skin is warm and dry.  Psychiatric:        Mood and Affect: Mood normal.        Behavior: Behavior normal.        Thought Content: Thought content normal.        Judgment: Judgment normal.  Vitals reviewed.     Assessment/Plan:  Encounter for annual routine gynecological examination  Encounter for screening mammogram for malignant neoplasm of breast; pt does mammos through [facility].   Malignant neoplasm of upper-outer quadrant of right breast in female, estrogen receptor positive (HCC)--managed at [facility]. Doing arimidex  for 10 yrs. Doing well.   Premature surgical menopause--cont ca/Vit D/exercise. Current on DEXA. Can't have hormones.   GYN counsel breast self exam, mammography screening, adequate intake of calcium and vitamin D , diet and exercise  No follow-ups on file.   Claretta Kendra B. Angeliz Settlemyre, PA-C 01/05/2024 2:38 PM

## 2024-01-06 ENCOUNTER — Ambulatory Visit: Admitting: Obstetrics and Gynecology

## 2024-01-06 ENCOUNTER — Encounter: Payer: Self-pay | Admitting: Obstetrics and Gynecology

## 2024-01-06 VITALS — BP 116/82 | HR 80 | Ht 65.0 in | Wt 287.9 lb

## 2024-01-06 DIAGNOSIS — C50411 Malignant neoplasm of upper-outer quadrant of right female breast: Secondary | ICD-10-CM

## 2024-01-06 DIAGNOSIS — Z170 Estrogen receptor positive status [ER+]: Secondary | ICD-10-CM

## 2024-01-06 DIAGNOSIS — Z01419 Encounter for gynecological examination (general) (routine) without abnormal findings: Secondary | ICD-10-CM | POA: Diagnosis not present

## 2024-01-06 DIAGNOSIS — Z1231 Encounter for screening mammogram for malignant neoplasm of breast: Secondary | ICD-10-CM

## 2024-01-06 NOTE — Patient Instructions (Signed)
 I value your feedback and you entrusting [place] with your care. If you get a [place] patient survey, I would appreciate you taking the time to let us know about your experience today. Thank you! ? ? ?

## 2024-01-17 ENCOUNTER — Other Ambulatory Visit: Payer: Self-pay

## 2024-01-18 ENCOUNTER — Ambulatory Visit: Admitting: Obstetrics and Gynecology

## 2024-01-20 DIAGNOSIS — Z9011 Acquired absence of right breast and nipple: Secondary | ICD-10-CM | POA: Diagnosis not present

## 2024-01-20 DIAGNOSIS — Z1231 Encounter for screening mammogram for malignant neoplasm of breast: Secondary | ICD-10-CM | POA: Diagnosis not present

## 2024-01-20 DIAGNOSIS — R92322 Mammographic fibroglandular density, left breast: Secondary | ICD-10-CM | POA: Diagnosis not present

## 2024-01-20 LAB — HM MAMMOGRAPHY: HM Mammogram: NORMAL

## 2024-01-20 NOTE — Progress Notes (Signed)
 [facility] Division of Surgical Oncology--Follow-up Visit  Date of Service: 01/20/2024  REFERRING / PRIMARY CARE PROVIDER: Leavy Mole, PA [address] [facility] [place] [address] [phone]  MEDICAL ONCOLOGIST:   Dr. Truman Raven RADIATION ONCOLOGIST:   Dr. Devere Nicolas GYNECOLOGIC ONCOLOGIST:  Dr. Prentice Agent  CHIEF COMPLAINT/REASON FOR VISIT: Annual post-treatment surveillance visit  HISTORY OF PRESENT ILLNESS  Mckenzie Lopez is a 38 y.o. female with a pathologic stage IB pT3 (8 cm), pN2a (5/21), cM0, grade 2, ER + PR + HER2 -, IDC and associated DCIS of the RIGHT breast s/p RIGHT modified radical mastectomy on 01/05/18 who presents for post-treatment evaluation. The patient has been doing well since her procedure and has completed adjuvant chemotherapy and radiation and  underwent TAH/BSO as well. She switched from letrozole  to anastrozole  with decreased joint pain. She denies any fevers, chills, uncontrollable pain, swelling, or drainage from her incision, which is well healed. She is accompanied today by her mother, Heron. They have a beach trip planned in October.   She reports a 20 lb intentional weight loss after starting Mounjaro . She is 5 years out from diagnosis and treatment.   Oncologic/Breast Disease History:  11/19/17: Bilateral screening mammogram d/t strong family history of breast cancer ([facility]) - right breast mass, focal asymmetry in the left breast. Recommend diagnostic imaging of both breasts.  11/26/17: Bilateral diagnostic mammogram and ultrasound ([facility]) -  Right breast: 2.1 x 1.7 x 1.6 cm mass of the right breast, 11:00, 4 cmfn; 0.9 x 0.6 x 0.5 cm mass of the right breast, 12:00, 2 cmfn; 1.1 x 1.1 x 1.0 cm mass of the right breast, 12:00, 8 cmfn. Right axillary US  with morphologically abnormal lymph nodes (up to 1.1 cm cortical thickening). Recommend biopsy of 11:00 mass, and 12:00 mass 8  cmfn. Recommend right axillary biopsy.  Left breast: Dispersement of previously identified asymmetry, no US  correlate either. left axillary single lymph node with diffuse thickening up to 0.5 cm. Recommend left axillary biopsy.  12/01/17: Right breast and bilateral axillary core needle biopsies ([facility]) -  Right breast, 11:00 mass, 4 cmfn, ribbon clip - grade 2, ER + PR + HER2 -, invasive mammary carcinoma, no special type. Ki-67 10.  Right breast, 12:00 mass, coil clip - grade 2, invasive mammary carcinoma. Ki-67 20. Right axillary lymph node, Hydromark clip - metastatic carcinoma without identifiable residual nodal tissue. Ki-67 30.  Left axillary lymph node, Hydromark clip - negative for malignancy. Comment: The two right breast tumors and axillary metastasis are histologically similar. No DCIS or LVI.  12/07/17: Surgical consult (Dr. Dessa, [facility]) - BRCA testing completed through patient's employer, [employer] on ~12/18/17, results pending.  12/20/17: Bilateral breast MRI ([facility]) - irregular 2.5 x 2.7 cm mass of the right breast, 12:00. Mass has been biopsied with associated clip noted. Multiple small adjacent masses up to 7 mm, likely satellite nodules. Additionally, there is non-mass enhancement which extends to below the described mass at the 1:00 position anterior depth. 1:00, anterior depth, irregular mass measuring up to 1 cm. 12:00, posterior and superior to the larger mass there is an area of susceptibility c/w a biopsy clip. 2:00, middle depth, focus of enhancement. Enlarged abnormal appearing right axillary node with associated clip. No concerning areas in left breast. Recommend second look ultrasound for the smaller mass at 1:00 in the right breast if clinically warranted and breast conservation desired. 12/22/17: [facility] interpretation of OSH pathology: grade 2, ER+ (>  90%, 3+), PR+ (>90%, 3+), HER2- (by IHC), invasive ductal carcinoma without DCIS at 11:00 4cm FN and at 12:00 8 cm FN, RIGHT  axillary LN with IDC (no definite LN seen), LEFT axillary LN with benign findings. 12/20/17: Bilateral Breast MRI ([facility]) - 2.5 x 2.7 cm irregular mass of the right breast, 12:00, anterior depth, biopsy-proven IDC. Multiple small masses immediately adjacent measuring up to 7 mm, likely presenting satellite nodules. Non-mass enhancement extending below the mass at 1:00, anterior depth. 1 cm mass of the right breast, 1:00, anterior depth. At 12:00, posterior and superior to larger mass, biopsy clip with biopsy proven IDC. At 2:00, middle depth there is a focus of enhancement. Enlarged appearing right axillary lymph node with biopsy clip, pathology demonstrates metastatic disease. No concerning areas of enhancement in the left breast. Recommend second look ultrasound for the smaller mass at 1:00 if clinically warranted and breast conservation is desired.  12/23/17: [facility] interpretation of OSH Imaging: Three masses in the right breast with biopsy proven IDC in the spiculated mass anterior depth at 11 o'clock, most posterior mass at 12 o'clock middle depth, and in a right axillary lymph node.  The mass between these two biopsied masses, which was identified as an enhancing mass at 2 o'clock on recent MRI is hypoechoic and spiculated on ultrasound and highly concerning for an additional area of IDC. If tissue diagnosis is required, further evaluation could be performed with US  guided biopsy.   01/05/18: right modified radical mastectomy; final pathology showed grade 2 IDC with associated DCIS forming three connected tumor nodules measuring 8 cm. Margins widely negative. Repeat biomarkers ER + PR + HER -. 5/21 lymph nodes positive for metastatic carcinoma, maximum deposit size 2.6, extracapsular invasion present. 01/20/18 - CT C/A/P and bone scan - no metastatic disease  01/31/18- 06/13/2018 Chemotherapy: Dose-dense doxorubicin -cyclophosphamide  (ddAC) q2 weeks x 4 cycles followed by weekly paclitaxel  (weekly-T) 06/20/18  Letrozole  and Goserelin started, switched to anastrozole .  07/06/18-08/16/2018 Radiation therapy (5000 cGy to the chest wall and regional nodes in 25 fractions)  11/22/18: TAH/BSO ([facility])  ALLERGIES: Allergies  Allergen Reactions  . Chlorthalidone Other (See Comments)    Severe hypokalemia   MEDICATIONS: Current Outpatient Medications  Medication Sig Dispense Refill  . amLODIPine  (NORVASC ) 5 MG tablet Take 5 mg by mouth once daily       . anastrozole  (ARIMIDEX ) 1 mg tablet Take 1 tablet (1 mg total) by mouth once daily 90 tablet 3  . baclofen-amitriptyline-ketamine topical gel Apply topically 2 (two) times daily as needed 90 g 1  . cyclobenzaprine  (FLEXERIL ) 5 MG tablet Take 1 tablet (5 mg total) by mouth 3 (three) times daily as needed for Muscle spasms 30 tablet 0  . DULoxetine  (CYMBALTA ) 30 MG DR capsule Take 3 capsules (90 mg total) by mouth once daily 270 capsule 3  . ergocalciferol , vitamin D2, 1,250 mcg (50,000 unit) capsule Take 1 capsule (50,000 Units total) by mouth once a week 12 capsule 0  . fluticasone  propionate (FLONASE ) 50 mcg/actuation nasal spray Place 2 sprays into both nostrils as needed       . gabapentin  (NEURONTIN ) 300 MG capsule Take 1 capsule (300 mg total) by mouth at bedtime. 90 capsule 3  . loratadine (CLARITIN) 10 mg tablet Take 10 mg by mouth once daily as needed for Allergies    . metoprolol  succinate (TOPROL -XL) 50 MG XL tablet     . MOUNJARO  15 mg/0.5 mL pen injector     . olopatadine  (PATADAY ) 0.2 %  ophthalmic solution Place 1 drop into both eyes as needed       . pimecrolimus  (ELIDEL ) 1 % cream Apply 1 Application topically    . triamcinolone  0.1 % cream Apply 1 Application topically    . valsartan -hydrochlorothiazide  (DIOVAN -HCT) 160-12.5 mg tablet Take by mouth    . cyanocobalamin  (VITAMIN B12) 100 MCG tablet Take by mouth (Patient not taking: Reported on 01/20/2024)    . labetalol  (TRANDATE ) 200 MG tablet Take 1 tablet (200 mg total) by mouth 2 (two) times  daily. (Patient not taking: Reported on 01/20/2024) 180 tablet 3  . MOUNJARO  12.5 mg/0.5 mL pen injector Inject 12.5 mg subcutaneously (Patient not taking: Reported on 01/20/2024)     No current facility-administered medications for this visit.     Objective:    Vitals Vitals:   01/20/24 0952  BP: 132/83  Pulse: 85  Resp: 18  Temp: 36.5 C (97.7 F)   Body mass index is 48.93 kg/m.  General appearance:  alert  HEENT [place]/AT, PERRL, EOMs intact, oropharynx clear.  Neck Supple without adenopathy or thyromegaly.  Lungs:  normal effort of breathing  Heart:  deferred  Abdomen: soft, non-tender; bowel sounds normal; no masses,  no organomegaly  Extremities No cyanosis, clubbing or edema.  Psych Appropriate.   Neurological Intact and nonfocal. Oriented x 3.  Lymph Nodes:  Cervical, supraclavicular, and axillary nodes normal.  Right Breast:  status post mastectomy on the right and mastectomy site well healed without palpable abnormalities  Left Breast:  normal without suspicious masses, skin or nipple changes or axillary nodes, examined in upright and supine position, nipples normal without inversion, lesions or discharge and no skin dimpling or peau d'orange; port removed, incision healed.   IMAGING Results for orders placed during the hospital encounter of 01/20/24  MAMMO SCREEN BREAST WITH TOMOSYNTHESIS LEFT  Narrative EXAM: MAMMO SCREEN BREAST WITH TOMOSYNTHESIS LEFT 01/20/2024  9:00 AM  INDICATION: Screening  COMPARISON: Compared to: 01/14/2023 Mammo diagnostic breast tomosynthesis left and 01/09/2022 Mammo diagnostic breast tomosynthesis left  TECHNIQUE: Tomosynthesis images were obtained as part of this exam.  FINDINGS: There are scattered areas of fibroglandular density in the left breast.  There are no suspicious masses, calcifications, or other findings in the left breast.  Status post right mastectomy.  Impression No mammographic evidence of malignancy.  Recommend routine mammography screening in one year.  The exam was electronically reviewed by a staff physician.  BI-RADS: 1 - Negative  [facility] [address] [facility] [address] [place], [place]  [address] Phone: [phone]   Assessment:  This is a 38 y.o. female with a right breast, pathological stage IB, pT3 (8 cm) N2a (5/21) M0, grade 2, ER + PR + HER2 - invasive ductal carcinoma with associated DCIS s/p RIGHT modified radical mastectomy on 01/05/18, adjuvant chemo and RT who presents for post-treatment surveillance.    Cancer Staging <redacted file path>  Malignant neoplasm of overlapping sites of right breast in female, estrogen receptor positive (CMS/HHS-HCC) Staging form: Breast, AJCC 8th Edition - Clinical stage from 12/01/2017: Stage IIA (cT2(m), cN1(f), cM0, G2, ER+, PR+, HER2-) - Signed by Sherlon Crayton Balboa, MD on 12/31/2017 - Pathologic stage from 01/05/2018: Stage IB (pT3, pN2a, cM0, G2, ER+, PR+, HER2-) - Signed by Sherlon Crayton Balboa, MD on 01/20/2018   Discussion:  The patient is doing well and her mmg today was WNL. She is 5 years out from treatment and will return with the Breast Surgery team as needed;  left mammogram ordered for next year. She will continue follow-up with medical oncology while on endocrine therapy and may transition to the [facility]orship Clinic thereafter. Prosthesis and bra prescriptions provided today.    Plan:   Problem List Items Addressed This Visit   None Visit Diagnoses       S/P mastectomy, right         Encounter for screening mammogram for malignant neoplasm of breast           1.  Follow up with surgical oncology: as needed, 5 years out 2.  Left mammogram in 1 year  2.  Follow up with medical oncology: as scheduled below 3.  Follow up with radiation oncology: as scheduled below   Future Appointments     Date/Time Provider Department Center Visit Type   01/20/2024 10:30  AM (Arrive by 10:15 AM) Oakley Julian, PA [facility] RETURN VISIT   07/27/2024 9:30 AM (Arrive by 9:00 AM) Mathew Milford Mini, NP [facility] Radiation [facility] FOLLOW UP VISIT RAD ONC   09/22/2024 12:00 PM [facility] 1A BNE [facility] Bone D[facility] DXA BONE DENSITY   09/22/2024 1:00 PM (Arrive by 12:45 PM) Prentiss Manuelita Clarity, PA [facility] RETURN VISIT      I spent a total of 25 minutes in both face-to-face and non-face-to-face activities, excluding procedures performed, for this visit on the date of this encounter.  Attestation Statement:   I personally performed the service, non-incident to. (WP)   SHEEVA MARVDASHTI, PA

## 2024-01-24 ENCOUNTER — Ambulatory Visit: Admitting: Nurse Practitioner

## 2024-01-24 VITALS — BP 122/88 | HR 95 | Temp 98.0°F | Resp 18 | Ht 65.0 in | Wt 289.4 lb

## 2024-01-24 DIAGNOSIS — Z170 Estrogen receptor positive status [ER+]: Secondary | ICD-10-CM | POA: Diagnosis not present

## 2024-01-24 DIAGNOSIS — Z6841 Body Mass Index (BMI) 40.0 and over, adult: Secondary | ICD-10-CM | POA: Diagnosis not present

## 2024-01-24 DIAGNOSIS — Z23 Encounter for immunization: Secondary | ICD-10-CM | POA: Diagnosis not present

## 2024-01-24 DIAGNOSIS — E1165 Type 2 diabetes mellitus with hyperglycemia: Secondary | ICD-10-CM | POA: Diagnosis not present

## 2024-01-24 DIAGNOSIS — I10 Essential (primary) hypertension: Secondary | ICD-10-CM

## 2024-01-24 DIAGNOSIS — E782 Mixed hyperlipidemia: Secondary | ICD-10-CM | POA: Diagnosis not present

## 2024-01-24 DIAGNOSIS — C50411 Malignant neoplasm of upper-outer quadrant of right female breast: Secondary | ICD-10-CM | POA: Diagnosis not present

## 2024-01-24 LAB — POCT GLYCOSYLATED HEMOGLOBIN (HGB A1C): Hemoglobin A1C: 5.7 % — AB (ref 4.0–5.6)

## 2024-01-24 NOTE — Assessment & Plan Note (Signed)
 Takes Mounjaro  15 mg weekly. Eats healthy and walks frequently.

## 2024-01-24 NOTE — Assessment & Plan Note (Signed)
 Takes Arimidex  1 mg daily. Previously had right breast mastectomy. Followed by oncology.

## 2024-01-24 NOTE — Assessment & Plan Note (Signed)
 Takes Amlodipine  5 mg daily, Metoprolol  succinate 50 mg daily, and Valsartan -hydrochlorothiazide  160-12.5 mg daily. Blood pressure well controlled and within range.

## 2024-01-24 NOTE — Progress Notes (Signed)
 BP 122/88   Pulse 95   Temp 98 F (36.7 C)   Resp 18   Ht 5' 5 (1.651 m)   Wt 289 lb 6.4 oz (131.3 kg)   LMP 02/08/2018 (Exact Date)   SpO2 96%   BMI 48.16 kg/m    Subjective:    Patient ID: Mckenzie Lopez, female    DOB: Mar 21, 1986, 38 y.o.   MRN: [id number]  HPI: Mckenzie Lopez is a 38 y.o. female  Chief Complaint  Patient presents with   Medical Management of Chronic Issues   Hypertension: -Medications: Amlodipine  5 mg daily, Metoprolol  succinate 50 mg daily, Valsartan -hydrochlorothiazide  160-12.5 mg daily -Patient is compliant with above medications and reports no side effects. -Checking BP at home (average): 110's/80's -Highest BP at home: 132/85 -Lowest BP at home: 110/80 -Denies any SOB, CP, vision changes, LE edema or symptoms of hypotension -Diet: Regular, well-balanced diet -Exercise: Walks daily  Diabetes, Type 2: -Last A1c: 5.7 (01/24/2024); improved, trending down -Medications: Mounjaro  15 mg weekly -Patient is compliant with the above medications and reports no side effects.  -Checks blood glucose at home a few times per week. Checks in morning and before bed. Blood glucose has ranged between 80's-110's. Highest blood glucose has been in 130's. -Foot exam: 06/25/2023 -Microalbumin: 06/25/2023 -Statin: Does not take statin. -Denies symptoms of hypoglycemia, polyuria, polydipsia, numbness extremities, foot ulcers/trauma.  Hyperlipidemia: - Last LDL recorded was 133 (06/25/2023). Managed with diet and exercise.   Breast cancer: She has a history of breast cancer and is on Arimidex  1 mg daily as part of her treatment regimen. She takes duloxetine  90 mg daily and gabapentin  300 mg at bedtime from her oncologist. Has had right breast mastectomy.   Obesity: - Medications: Mounjaro  15 mg daily. Endorses eating healthy and avoiding fried foods. Walks daily.   PHQ-9 is negative for depression.    01/24/2024   11:08 AM 09/22/2023   11:14 AM  03/25/2023   11:16 AM  Depression screen PHQ 2/9  Decreased Interest 0 0 0  Down, Depressed, Hopeless 0 0 0  PHQ - 2 Score 0 0 0  Altered sleeping 0 0   Tired, decreased energy 0 0   Change in appetite 0 0   Feeling bad or failure about yourself  0 0   Trouble concentrating 0 0   Moving slowly or fidgety/restless 0 0   Suicidal thoughts 0 0   PHQ-9 Score 0 0   Difficult doing work/chores Not difficult at all Not difficult at all     Relevant past medical, surgical, family and social history reviewed and updated as indicated. Interim medical history since our last visit reviewed. Allergies and medications reviewed and updated.  Review of Systems  Per HPI unless specifically indicated above Constitutional: Negative for fever or weight change.  Respiratory: Negative for cough and shortness of breath.   Cardiovascular: Negative for chest pain or palpitations.  Gastrointestinal: Negative for abdominal pain, no bowel changes.  Musculoskeletal: Negative for gait problem or joint swelling.  Skin: Negative for rash.  Neurological: Negative for dizziness or headache.  No other specific complaints in a complete review of systems (except as listed in HPI above).      Objective:     BP 122/88   Pulse 95   Temp 98 F (36.7 C)   Resp 18   Ht 5' 5 (1.651 m)   Wt 289 lb 6.4 oz (131.3 kg)   LMP 02/08/2018 (Exact Date)  SpO2 96%   BMI 48.16 kg/m    Wt Readings from Last 3 Encounters:  01/24/24 289 lb 6.4 oz (131.3 kg)  01/06/24 287 lb 14.4 oz (130.6 kg)  09/22/23 (!) 304 lb 14.4 oz (138.3 kg)    Physical Exam Constitutional:      Appearance: Normal appearance.  Cardiovascular:     Rate and Rhythm: Normal rate and regular rhythm.     Heart sounds: Normal heart sounds.  Pulmonary:     Effort: Pulmonary effort is normal.     Breath sounds: Normal breath sounds.  Skin:    General: Skin is warm and dry.  Neurological:     General: No focal deficit present.     Mental  Status: She is alert and oriented to person, place, and time. Mental status is at baseline.  Psychiatric:        Mood and Affect: Mood normal.        Behavior: Behavior normal.        Thought Content: Thought content normal.        Judgment: Judgment normal.      Results for orders placed or performed in visit on 01/24/24  POCT glycosylated hemoglobin (Hb A1C)   Collection Time: 01/24/24 11:34 AM  Result Value Ref Range   Hemoglobin A1C 5.7 (A) 4.0 - 5.6 %   HbA1c POC (<> result, manual entry)     HbA1c, POC (prediabetic range)     HbA1c, POC (controlled diabetic range)            Assessment & Plan:   Problem List Items Addressed This Visit       Cardiovascular and Mediastinum   Essential hypertension   Takes Amlodipine  5 mg daily, Metoprolol  succinate 50 mg daily, and Valsartan -hydrochlorothiazide  160-12.5 mg daily. Blood pressure well controlled and within range.        Other   Morbid obesity with BMI of 50.0-59.9, adult (HCC)   Takes Mounjaro  15 mg weekly. Eats healthy and walks frequently.      Hyperlipidemia   Will check lipid panel at next visit. Managed with diet and exercise. Receives medications for HTN.      Malignant neoplasm of upper-outer quadrant of right breast in female, estrogen receptor positive (HCC)   Takes Arimidex  1 mg daily. Previously had right breast mastectomy. Followed by oncology.      Other Visit Diagnoses       Type 2 diabetes mellitus with hyperglycemia, without long-term current use of insulin  (HCC)    -  Primary   Takes Mounjaro  15 mg weekly. HbA1c ordered and was 5.7. Has improved since last check. Blood glucose within target range.   Relevant Orders   POCT glycosylated hemoglobin (Hb A1C) (Completed)     Immunization due       Relevant Orders   Flu vaccine trivalent PF, 6mos and older(Flulaval,Afluria,Fluarix,Fluzone) (Completed)            Follow up plan: Return in about 4 months (around 05/25/2024) for follow  up.     I have reviewed this encounter including the documentation in this note and/or discussed this patient with the provider, Alexa Everhart SNP, I am certifying that I agree with the content of this note as supervising/preceptor nurse practitioner.  Mliss Spray, FNP-C [facility] 01/24/2024, 12:56 PM

## 2024-01-24 NOTE — Assessment & Plan Note (Signed)
 Will check lipid panel at next visit. Managed with diet and exercise. Receives medications for HTN.

## 2024-02-14 ENCOUNTER — Other Ambulatory Visit: Payer: Self-pay

## 2024-03-08 ENCOUNTER — Other Ambulatory Visit: Payer: Self-pay

## 2024-03-19 ENCOUNTER — Other Ambulatory Visit: Payer: Self-pay

## 2024-03-20 ENCOUNTER — Other Ambulatory Visit: Payer: Self-pay

## 2024-03-20 MED ORDER — DULOXETINE HCL 30 MG PO CPEP
30.0000 mg | ORAL_CAPSULE | Freq: Every day | ORAL | 1 refills | Status: AC
  Filled 2024-03-20 – 2024-04-05 (×2): qty 90, 90d supply, fill #0

## 2024-03-30 ENCOUNTER — Other Ambulatory Visit: Payer: Self-pay

## 2024-04-05 ENCOUNTER — Other Ambulatory Visit: Payer: Self-pay

## 2024-04-05 ENCOUNTER — Other Ambulatory Visit: Payer: Self-pay | Admitting: Nurse Practitioner

## 2024-04-05 DIAGNOSIS — I10 Essential (primary) hypertension: Secondary | ICD-10-CM

## 2024-04-07 ENCOUNTER — Other Ambulatory Visit: Payer: Self-pay | Admitting: Nurse Practitioner

## 2024-04-07 ENCOUNTER — Other Ambulatory Visit: Payer: Self-pay

## 2024-04-07 DIAGNOSIS — J309 Allergic rhinitis, unspecified: Secondary | ICD-10-CM

## 2024-04-07 DIAGNOSIS — I10 Essential (primary) hypertension: Secondary | ICD-10-CM

## 2024-04-08 ENCOUNTER — Other Ambulatory Visit: Payer: Self-pay

## 2024-04-09 ENCOUNTER — Encounter: Payer: Self-pay | Admitting: Nurse Practitioner

## 2024-04-09 DIAGNOSIS — I10 Essential (primary) hypertension: Secondary | ICD-10-CM

## 2024-04-10 ENCOUNTER — Other Ambulatory Visit: Payer: Self-pay | Admitting: Nurse Practitioner

## 2024-04-10 ENCOUNTER — Other Ambulatory Visit: Payer: Self-pay

## 2024-04-10 DIAGNOSIS — J309 Allergic rhinitis, unspecified: Secondary | ICD-10-CM

## 2024-04-10 MED ORDER — METOPROLOL SUCCINATE ER 50 MG PO TB24
50.0000 mg | ORAL_TABLET | Freq: Every day | ORAL | 0 refills | Status: DC
  Filled 2024-04-10: qty 90, 90d supply, fill #0

## 2024-04-10 MED ORDER — AMLODIPINE BESYLATE 5 MG PO TABS
5.0000 mg | ORAL_TABLET | Freq: Every day | ORAL | 0 refills | Status: DC
  Filled 2024-04-10: qty 90, 90d supply, fill #0

## 2024-04-11 ENCOUNTER — Other Ambulatory Visit: Payer: Self-pay

## 2024-04-13 ENCOUNTER — Other Ambulatory Visit: Payer: Self-pay

## 2024-04-13 MED FILL — Fluticasone Propionate Nasal Susp 50 MCG/ACT: NASAL | 30 days supply | Qty: 16 | Fill #0 | Status: CN

## 2024-04-13 NOTE — Telephone Encounter (Signed)
 Requested Prescriptions  Pending Prescriptions Disp Refills   fluticasone  (FLONASE ) 50 MCG/ACT nasal spray 16 g 5    Sig: Place 1 spray into both nostrils daily.     Ear, Nose, and Throat: Nasal Preparations - Corticosteroids Passed - 04/13/2024  2:10 PM      Passed - Valid encounter within last 12 months    Recent Outpatient Visits           2 months ago Type 2 diabetes mellitus with hyperglycemia, without long-term current use of insulin  [facility])   [facility] Gareth Mliss [place], FNP   6 months ago Essential hypertension   [facility] Gareth Mliss FALCON, FNP   9 months ago Essential hypertension   [facility] Gareth Mliss FALCON, [place]

## 2024-04-23 ENCOUNTER — Other Ambulatory Visit: Payer: Self-pay

## 2024-04-23 MED FILL — Fluticasone Propionate Nasal Susp 50 MCG/ACT: NASAL | 30 days supply | Qty: 16 | Fill #0 | Status: AC

## 2024-04-24 ENCOUNTER — Other Ambulatory Visit: Payer: Self-pay

## 2024-05-08 ENCOUNTER — Other Ambulatory Visit: Payer: Self-pay

## 2024-05-23 MED FILL — Fluticasone Propionate Nasal Susp 50 MCG/ACT: NASAL | 30 days supply | Qty: 16 | Fill #1 | Status: AC

## 2024-05-25 ENCOUNTER — Other Ambulatory Visit: Payer: Self-pay

## 2024-05-25 ENCOUNTER — Ambulatory Visit: Admitting: Nurse Practitioner

## 2024-05-25 ENCOUNTER — Encounter: Payer: Self-pay | Admitting: Nurse Practitioner

## 2024-05-25 VITALS — BP 124/82 | HR 97 | Temp 98.4°F | Resp 16 | Ht 65.0 in | Wt 285.3 lb

## 2024-05-25 DIAGNOSIS — C50411 Malignant neoplasm of upper-outer quadrant of right female breast: Secondary | ICD-10-CM | POA: Diagnosis not present

## 2024-05-25 DIAGNOSIS — E119 Type 2 diabetes mellitus without complications: Secondary | ICD-10-CM | POA: Diagnosis not present

## 2024-05-25 DIAGNOSIS — I119 Hypertensive heart disease without heart failure: Secondary | ICD-10-CM

## 2024-05-25 DIAGNOSIS — Z6841 Body Mass Index (BMI) 40.0 and over, adult: Secondary | ICD-10-CM | POA: Diagnosis not present

## 2024-05-25 DIAGNOSIS — E782 Mixed hyperlipidemia: Secondary | ICD-10-CM | POA: Diagnosis not present

## 2024-05-25 DIAGNOSIS — Z7985 Long-term (current) use of injectable non-insulin antidiabetic drugs: Secondary | ICD-10-CM | POA: Diagnosis not present

## 2024-05-25 DIAGNOSIS — I10 Essential (primary) hypertension: Secondary | ICD-10-CM

## 2024-05-25 DIAGNOSIS — Z170 Estrogen receptor positive status [ER+]: Secondary | ICD-10-CM

## 2024-05-25 DIAGNOSIS — G43009 Migraine without aura, not intractable, without status migrainosus: Secondary | ICD-10-CM | POA: Diagnosis not present

## 2024-05-25 LAB — POCT GLYCOSYLATED HEMOGLOBIN (HGB A1C): Hemoglobin A1C: 5.7 % — AB (ref 4.0–5.6)

## 2024-05-25 MED ORDER — METOPROLOL SUCCINATE ER 50 MG PO TB24
50.0000 mg | ORAL_TABLET | Freq: Every day | ORAL | 1 refills | Status: AC
  Filled 2024-05-25: qty 90, 90d supply, fill #0

## 2024-05-25 MED ORDER — VALSARTAN-HYDROCHLOROTHIAZIDE 160-12.5 MG PO TABS
1.0000 | ORAL_TABLET | Freq: Every day | ORAL | 1 refills | Status: AC
  Filled 2024-05-25: qty 90, 90d supply, fill #0

## 2024-05-25 MED ORDER — AMLODIPINE BESYLATE 5 MG PO TABS
5.0000 mg | ORAL_TABLET | Freq: Every day | ORAL | 1 refills | Status: AC
  Filled 2024-05-25: qty 90, 90d supply, fill #0

## 2024-05-25 NOTE — Progress Notes (Signed)
 "  BP 124/82 (Cuff Size: Large)   Pulse 97   Temp 98.4 F (36.9 C) (Oral)   Resp 16   Ht 5' 5 (1.651 m)   Wt 285 lb 4.8 oz (129.4 kg)   LMP 02/08/2018   SpO2 97%   BMI 47.48 kg/m    Subjective:    Patient ID: Mckenzie Lopez, female    DOB: 1985-06-26, 39 y.o.   MRN: [id number]  HPI: Mckenzie Lopez is a 39 y.o. female  Chief Complaint  Patient presents with   Medical Management of Chronic Issues    4 month recheck    Discussed the use of AI scribe software for clinical note transcription with the patient, who gave verbal consent to proceed.  History of Present Illness Mckenzie Lopez is a 39 year old female with a history of hypertension, type two diabetes, right breast cancer, migraines, obesity, and hyperlipidemia who presents for a routine follow-up.  Hypertension and cardiac status - Hypertension managed with amlodipine  5 mg daily, metoprolol  50 mg daily, and valsartan -hydrochlorothiazide  160-12.5 mg daily. - History of left ventricular hypertrophy.  Type 2 diabetes mellitus - Type 2 diabetes managed with Mounjaro  15 mg weekly. - Hemoglobin A1c improved from 5.7% to 5.2%.  Breast cancer status - History of right breast cancer, status post right mastectomy. - Continues regular follow-up with oncologist and radiologist. - Currently taking Arimidex  1 mg daily.  Migraine headaches - History of migraines. - No recent migraine episodes. - Current medications include duloxetine  30 mg daily and gabapentin  300 mg at bedtime.  Obesity and weight management - Actively working on [employer]. - Current weight is 285 pounds. Wt Readings from Last 3 Encounters:  05/25/24 285 lb 4.8 oz (129.4 kg)  01/24/24 289 lb 6.4 oz (131.3 kg)  01/06/24 287 lb 14.4 oz (130.6 kg)   Body mass index is 47.48 kg/m.     Hyperlipidemia - Hyperlipidemia with last lipid panel showing LDL 143 mg/dL and total cholesterol 784 mg/dL.  Other medications and general health -  Current medications also include Flexeril  5 mg three times a day as needed, Flonase  as needed, and loratadine 10 mg daily. - No recent migraines. - Mental health is stable.         01/24/2024   11:08 AM 09/22/2023   11:14 AM 03/25/2023   11:16 AM  Depression screen PHQ 2/9  Decreased Interest 0 0 0  Down, Depressed, Hopeless 0 0 0  PHQ - 2 Score 0 0 0  Altered sleeping 0 0   Tired, decreased energy 0 0   Change in appetite 0 0   Feeling bad or failure about yourself  0 0   Trouble concentrating 0 0   Moving slowly or fidgety/restless 0 0   Suicidal thoughts 0 0   PHQ-9 Score 0  0    Difficult doing work/chores Not difficult at all Not difficult at all      Data saved with a previous flowsheet row definition    Relevant past medical, surgical, family and social history reviewed and updated as indicated. Interim medical history since our last visit reviewed. Allergies and medications reviewed and updated.  Review of Systems  Constitutional: Negative for fever or weight change.  Respiratory: Negative for cough and shortness of breath.   Cardiovascular: Negative for chest pain or palpitations.  Gastrointestinal: Negative for abdominal pain, no bowel changes.  Musculoskeletal: Negative for gait problem or joint swelling.  Skin: Negative for rash.  Neurological: Negative for dizziness or headache.  No other specific complaints in a complete review of systems (except as listed in HPI above).      Objective:      BP 124/82 (Cuff Size: Large)   Pulse 97   Temp 98.4 F (36.9 C) (Oral)   Resp 16   Ht 5' 5 (1.651 m)   Wt 285 lb 4.8 oz (129.4 kg)   LMP 02/08/2018   SpO2 97%   BMI 47.48 kg/m    Wt Readings from Last 3 Encounters:  05/25/24 285 lb 4.8 oz (129.4 kg)  01/24/24 289 lb 6.4 oz (131.3 kg)  01/06/24 287 lb 14.4 oz (130.6 kg)    Physical Exam VITALS: BP- 124/82 MEASUREMENTS: Weight- 285. GENERAL: Alert, cooperative, well developed, no acute distress HEENT:  Normocephalic, normal oropharynx, moist mucous membranes CHEST: Clear to auscultation bilaterally, no wheezes, rhonchi, or crackles CARDIOVASCULAR: Normal heart rate and rhythm, S1 and S2 normal without murmurs ABDOMEN: Soft, non-tender, non-distended, without organomegaly, normal bowel sounds EXTREMITIES: No cyanosis or edema NEUROLOGICAL: Cranial nerves grossly intact, moves all extremities without gross motor or sensory deficit  Results for orders placed or performed in visit on 05/25/24  HM DEXA SCAN   Collection Time: 09/21/22 12:00 AM  Result Value Ref Range   HM Dexa Scan normal   POCT HgB A1C   Collection Time: 05/25/24  7:48 AM  Result Value Ref Range   Hemoglobin A1C 5.7 (A) 4.0 - 5.6 %   HbA1c POC (<> result, manual entry)     HbA1c, POC (prediabetic range)     HbA1c, POC (controlled diabetic range)            Assessment & Plan:   Problem List Items Addressed This Visit       Cardiovascular and Mediastinum   Essential hypertension - Primary   Relevant Medications   valsartan -hydrochlorothiazide  (DIOVAN -HCT) 160-12.5 MG tablet   amLODipine  (NORVASC ) 5 MG tablet   metoprolol  succinate (TOPROL -XL) 50 MG 24 hr tablet   Other Relevant Orders   CBC with Differential/Platelet   Comprehensive metabolic panel with GFR   Migraines   Relevant Medications   valsartan -hydrochlorothiazide  (DIOVAN -HCT) 160-12.5 MG tablet   amLODipine  (NORVASC ) 5 MG tablet   metoprolol  succinate (TOPROL -XL) 50 MG 24 hr tablet   LVH (left ventricular hypertrophy) due to hypertensive disease, without heart failure   Relevant Medications   valsartan -hydrochlorothiazide  (DIOVAN -HCT) 160-12.5 MG tablet   amLODipine  (NORVASC ) 5 MG tablet   metoprolol  succinate (TOPROL -XL) 50 MG 24 hr tablet     Endocrine   New onset type 2 diabetes mellitus (HCC)   Relevant Medications   valsartan -hydrochlorothiazide  (DIOVAN -HCT) 160-12.5 MG tablet   Other Relevant Orders   Comprehensive metabolic panel  with GFR   Microalbumin / creatinine urine ratio   POCT HgB A1C (Completed)     Other   Morbid obesity with BMI of 50.0-59.9, adult (HCC)   Hyperlipidemia   Relevant Medications   valsartan -hydrochlorothiazide  (DIOVAN -HCT) 160-12.5 MG tablet   amLODipine  (NORVASC ) 5 MG tablet   metoprolol  succinate (TOPROL -XL) 50 MG 24 hr tablet   Other Relevant Orders   Comprehensive metabolic panel with GFR   Lipid panel   Malignant neoplasm of upper-outer quadrant of right breast in female, estrogen receptor positive (HCC)     Assessment and Plan Assessment & Plan Essential hypertension with left ventricular hypertrophy Blood pressure is well-controlled at 124/82 mmHg. - Continue current antihypertensive regimen including amlodipine , metoprolol , and valsartan -hydrochlorothiazide . - Refilled valsartan  prescription.  Type 2 diabetes mellitus A1c is excellent at 5.2%. - Continue current diabetes management regimen including Mounjaro . - Will schedule regular labs at next visit.  Malignant neoplasm of right breast, estrogen receptor positive, status post mastectomy Under oncology care with regular follow-ups. Recent mammogram and bone density scan completed. - Continue follow-up with oncology and radiology as scheduled.  Morbid obesity Weight is 285 pounds. Actively working on [employer].  Mixed hyperlipidemia Last lipid panel in 2024 showed LDL of 143 mg/dL and total cholesterol of 215 mg/dL. - Advised fasting before next lipid panel to obtain accurate cholesterol levels.  Migraine without aura No recent migraine episodes reported.  General health maintenance Routine health maintenance is up to date with recent mammogram and bone density scan. - Continue routine health maintenance and screenings as scheduled.        Follow up plan: Return in about 6 months (around 11/22/2024) for follow up. "

## 2024-05-31 ENCOUNTER — Other Ambulatory Visit: Payer: Self-pay

## 2024-11-22 ENCOUNTER — Ambulatory Visit: Admitting: Nurse Practitioner
